# Patient Record
Sex: Female | Born: 1940 | Race: White | Hispanic: No | Marital: Married
Health system: Midwestern US, Community
[De-identification: ages and names within clinical notes are randomized; demographics above are authoritative.]

## PROBLEM LIST (undated history)

## (undated) DIAGNOSIS — I479 Paroxysmal tachycardia, unspecified: Secondary | ICD-10-CM

## (undated) DIAGNOSIS — C4491 Basal cell carcinoma of skin, unspecified: Secondary | ICD-10-CM

## (undated) DIAGNOSIS — E785 Hyperlipidemia, unspecified: Secondary | ICD-10-CM

## (undated) DIAGNOSIS — F418 Other specified anxiety disorders: Secondary | ICD-10-CM

## (undated) DIAGNOSIS — M549 Dorsalgia, unspecified: Secondary | ICD-10-CM

## (undated) DIAGNOSIS — I10 Essential (primary) hypertension: Secondary | ICD-10-CM

## (undated) DIAGNOSIS — I499 Cardiac arrhythmia, unspecified: Secondary | ICD-10-CM

## (undated) DIAGNOSIS — M25559 Pain in unspecified hip: Secondary | ICD-10-CM

## (undated) DIAGNOSIS — G709 Myoneural disorder, unspecified: Secondary | ICD-10-CM

## (undated) DIAGNOSIS — S32402A Unspecified fracture of left acetabulum, initial encounter for closed fracture: Secondary | ICD-10-CM

## (undated) DIAGNOSIS — M5416 Radiculopathy, lumbar region: Secondary | ICD-10-CM

## (undated) DIAGNOSIS — M48062 Spinal stenosis, lumbar region with neurogenic claudication: Secondary | ICD-10-CM

## (undated) HISTORY — PX: BREAST BIOPSY: SHX20

## (undated) HISTORY — DX: Paroxysmal tachycardia, unspecified: I47.9

## (undated) HISTORY — PX: CARDIAC CATHETERIZATION: SHX172

## (undated) HISTORY — DX: Other specified anxiety disorders: F41.8

## (undated) HISTORY — PX: ABDOMINAL HYSTERECTOMY: SUR658

## (undated) HISTORY — DX: Myoneural disorder, unspecified: G70.9

## (undated) HISTORY — DX: Hyperlipidemia, unspecified: E78.5

## (undated) HISTORY — DX: Dorsalgia, unspecified: M54.9

## (undated) HISTORY — DX: Pain in unspecified hip: M25.559

## (undated) HISTORY — DX: Basal cell carcinoma of skin, unspecified: C44.91

## (undated) HISTORY — DX: Essential (primary) hypertension: I10

## (undated) HISTORY — DX: Cardiac arrhythmia, unspecified: I49.9

## (undated) HISTORY — PX: TOTAL HIP ARTHROPLASTY: SHX124

---

## 2000-05-31 HISTORY — PX: BREAST EXCISIONAL BIOPSY: SUR124

## 2013-03-31 HISTORY — PX: OTHER SURGICAL HISTORY: SHX169

## 2017-02-07 ENCOUNTER — Inpatient Hospital Stay: Admit: 2017-02-07 | Payer: MEDICARE | Primary: Internal Medicine

## 2017-02-07 DIAGNOSIS — Z01818 Encounter for other preprocedural examination: Secondary | ICD-10-CM

## 2017-02-07 LAB — URINALYSIS W/ REFLEX CULTURE
Bilirubin: NEGATIVE
Blood: NEGATIVE
Glucose: NEGATIVE mg/dL
Nitrites: NEGATIVE
Protein: NEGATIVE mg/dL
Specific gravity: 1.023 (ref 1.003–1.030)
Urobilinogen: 1 EU/dL (ref 0.2–1.0)
pH (UA): 6 (ref 5.0–8.0)

## 2017-02-07 LAB — CBC WITH AUTOMATED DIFF
ABS. BASOPHILS: 0 10*3/uL (ref 0.0–0.1)
ABS. EOSINOPHILS: 0.1 10*3/uL (ref 0.0–0.4)
ABS. IMM. GRANS.: 0 10*3/uL (ref 0.00–0.04)
ABS. LYMPHOCYTES: 1.7 10*3/uL (ref 0.8–3.5)
ABS. MONOCYTES: 0.4 10*3/uL (ref 0.0–1.0)
ABS. NEUTROPHILS: 4.6 10*3/uL (ref 1.8–8.0)
ABSOLUTE NRBC: 0 10*3/uL (ref 0.00–0.01)
BASOPHILS: 1 % (ref 0–1)
EOSINOPHILS: 1 % (ref 0–7)
HCT: 38.7 % (ref 35.0–47.0)
HGB: 12.4 g/dL (ref 11.5–16.0)
IMMATURE GRANULOCYTES: 0 % (ref 0.0–0.5)
LYMPHOCYTES: 24 % (ref 12–49)
MCH: 30.2 PG (ref 26.0–34.0)
MCHC: 32 g/dL (ref 30.0–36.5)
MCV: 94.4 FL (ref 80.0–99.0)
MONOCYTES: 7 % (ref 5–13)
MPV: 10.4 FL (ref 8.9–12.9)
NEUTROPHILS: 67 % (ref 32–75)
NRBC: 0 PER 100 WBC
PLATELET: 233 10*3/uL (ref 150–400)
RBC: 4.1 M/uL (ref 3.80–5.20)
RDW: 13.5 % (ref 11.5–14.5)
WBC: 6.8 10*3/uL (ref 3.6–11.0)

## 2017-02-07 LAB — METABOLIC PANEL, COMPREHENSIVE
A-G Ratio: 1.1 (ref 1.1–2.2)
ALT (SGPT): 25 U/L (ref 12–78)
AST (SGOT): 22 U/L (ref 15–37)
Albumin: 3.6 g/dL (ref 3.5–5.0)
Alk. phosphatase: 99 U/L (ref 45–117)
Anion gap: 9 mmol/L (ref 5–15)
BUN/Creatinine ratio: 18 (ref 12–20)
BUN: 16 MG/DL (ref 6–20)
Bilirubin, total: 0.5 MG/DL (ref 0.2–1.0)
CO2: 32 mmol/L (ref 21–32)
Calcium: 9.5 MG/DL (ref 8.5–10.1)
Chloride: 102 mmol/L (ref 97–108)
Creatinine: 0.88 MG/DL (ref 0.55–1.02)
GFR est AA: >60 mL/min/{1.73_m2} (ref >60)
GFR est non-AA: >60 mL/min/{1.73_m2} (ref >60)
Globulin: 3.3 g/dL (ref 2.0–4.0)
Glucose: 85 mg/dL (ref 65–100)
Potassium: 4.4 mmol/L (ref 3.5–5.1)
Protein, total: 6.9 g/dL (ref 6.4–8.2)
Sodium: 143 mmol/L (ref 136–145)

## 2017-02-07 LAB — HEMOGLOBIN A1C WITH EAG
Est. average glucose: 105 mg/dL
Hemoglobin A1c: 5.3 % (ref 4.2–6.3)

## 2017-02-07 LAB — PROTHROMBIN TIME + INR
INR: 1 (ref 0.9–1.1)
Prothrombin time: 10.5 s (ref 9.0–11.1)

## 2017-02-07 LAB — TYPE & SCREEN
ABO/Rh(D): A POS
Antibody screen: NEGATIVE

## 2017-02-07 LAB — PTT: aPTT: 23.2 s (ref 22.1–32.0)

## 2017-02-07 LAB — TYPE AND SCREEN
ABO/Rh: A POS
Antibody Screen: NEGATIVE

## 2017-02-07 NOTE — Other (Signed)
Requested today's OV note from Dr. Michel HarrowEvan's office, cardiologist.  Have not received the note as of yet.  DOS: 02/15/2017

## 2017-02-07 NOTE — H&P (Addendum)
PAT Pre-Op History & Physical    Patient: Natasha White                  MRN: [id number]          SSN: [id number]  Date of Birth: Nov 25, 1940          Age: 76 y.o.             Sex: female              Subjective:   Patient is a 76 y.o. Caucasian female who presents with history of chronic lower back pain that began in January of this year per patient report. Denies any injury or trauma that preceded the onset of her lower back pain. Rates her pain 7/10- 10/10. States that her pain radiates from her lower back into her left inner thigh and groin. Describes her pain as intermittent aching and burning pain. Walking exacerbates her pain and it is worse at night. She has to use a walker at home and her activity level has decreased due to her lower back pain.  Ha failed ESI, NSAIDs, narcotic pain medication (oral and patch) and PT.  The patient was evaluated in the surgeon's office and it was determined that the most appropriate plan of care is to proceed with surgical intervention.  Patient's PCP Esperanza Sheets, MD            Past Medical History:   Diagnosis Date   ??? Arrhythmia     PVCs   ??? Arthritis    ??? Bilateral lower extremity edema     Takes Bumex PO daily PRN.   ??? 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 Chronic pain     back/ left hip   ??? GERD (gastroesophageal reflux disease)    ??? High cholesterol    ??? Hypertension    ??? Nausea & vomiting     scopalamine patch helps   ??? Obesity (BMI 30-39.9) 02/07/2017    BMI= 35.6   ??? Psychiatric disorder     depression/ anxiety   ??? Vertigo       Past Surgical History:   Procedure Laterality Date   ??? HX BREAST BIOPSY      benign   ??? HX HEART CATHETERIZATION  1998    no blockages   ??? HX HYSTERECTOMY  1987    also removed ovaries   ??? HX KNEE REPLACEMENT Left 2014    had surgery after a fall at rehab- ? revision   ??? TOTAL KNEE ARTHROPLASTY Right 2009, 2012      Prior to Admission medications    Medication Sig Start Date End Date Taking? Authorizing Provider    metoprolol succinate (TOPROL-XL) 50 mg XL tablet Take 50 mg by mouth Daily (before breakfast).   Yes Historical Provider   pravastatin (PRAVACHOL) 40 mg tablet Take 40 mg by mouth nightly.   Yes Historical Provider   buPROPion SR [facility] SR) 150 mg SR tablet Take 150 mg by mouth two (2) times a day.   Yes Historical Provider   citalopram (CELEXA) 40 mg tablet Take 40 mg by mouth Daily (before breakfast).   Yes Historical Provider   aspirin delayed-release 81 mg tablet Take 81 mg by mouth Daily (before breakfast).   Yes Historical Provider   fentaNYL (DURAGESIC) 50 mcg/hr PATCH 1 Patch by TransDERmal route every seventy-two (72) hours.   Yes Historical Provider   cholecalciferol (VITAMIN D3) 1,000 unit  tablet Take 1,000 Units by mouth daily.   Yes Historical Provider   calcium-cholecalciferol, d3, (CALCIUM 600 + D) 600-125 mg-unit tab Take 1 Tab by mouth daily.   Yes Historical Provider   multivitamin with minerals (HAIR,SKIN AND NAILS PO) Take 2 Tabs by mouth daily.   Yes Historical Provider   gabapentin (NEURONTIN) 300 mg capsule Take 300 mg by mouth Daily (before breakfast).   Yes Historical Provider   gabapentin (NEURONTIN) 600 mg tablet Take 600 mg by mouth nightly.   Yes Historical Provider   oxyCODONE-acetaminophen (PERCOCET) 7.5-325 mg per tablet Take 1 Tab by mouth every eight (8) hours as needed for Pain. Indications: Pain   Yes Historical Provider   LORazepam (ATIVAN) 1 mg tablet Take 1 mg by mouth daily as needed for Anxiety.   Yes Historical Provider   ibuprofen (ADVIL) 200 mg tablet Take 200 mg by mouth every six (6) hours as needed for Pain. Takes 2-3 tablets   Yes Historical Provider   bumetanide (BUMEX PO) Take 1 mg by mouth daily as needed.   Yes Historical Provider     Current Outpatient Prescriptions   Medication Sig   ??? metoprolol succinate (TOPROL-XL) 50 mg XL tablet Take 50 mg by mouth Daily (before breakfast).   ??? pravastatin (PRAVACHOL) 40 mg tablet Take 40 mg by mouth nightly.    ??? buPROPion SR (WELLBUTRIN SR) 150 mg SR tablet Take 150 mg by mouth two (2) times a day.   ??? citalopram (CELEXA) 40 mg tablet Take 40 mg by mouth Daily (before breakfast).   ??? aspirin delayed-release 81 mg tablet Take 81 mg by mouth Daily (before breakfast).   ??? fentaNYL (DURAGESIC) 50 mcg/hr PATCH 1 Patch by TransDERmal route every seventy-two (72) hours.   ??? cholecalciferol (VITAMIN D3) 1,000 unit tablet Take 1,000 Units by mouth daily.   ??? calcium-cholecalciferol, d3, (CALCIUM 600 + D) 600-125 mg-unit tab Take 1 Tab by mouth daily.   ??? multivitamin with minerals (HAIR,SKIN AND NAILS PO) Take 2 Tabs by mouth daily.   ??? gabapentin (NEURONTIN) 300 mg capsule Take 300 mg by mouth Daily (before breakfast).   ??? gabapentin (NEURONTIN) 600 mg tablet Take 600 mg by mouth nightly.   ??? oxyCODONE-acetaminophen (PERCOCET) 7.5-325 mg per tablet Take 1 Tab by mouth every eight (8) hours as needed for Pain. Indications: Pain   ??? LORazepam (ATIVAN) 1 mg tablet Take 1 mg by mouth daily as needed for Anxiety.   ??? ibuprofen (ADVIL) 200 mg tablet Take 200 mg by mouth every six (6) hours as needed for Pain. Takes 2-3 tablets   ??? bumetanide (BUMEX PO) Take 1 mg by mouth daily as needed.     No current facility-administered medications for this encounter.       Allergies   Allergen Reactions   ??? Codeine Nausea Only      Social History   Substance Use Topics   ??? Smoking status: Former Smoker     Packs/day: 0.20     Years: 3.00     Quit date: 02/08/1964   ??? Smokeless tobacco: Never Used   ??? Alcohol use 4.2 oz/week     7 Glasses of wine per week      History   Drug Use No     Family History   Problem Relation Age of Onset   ??? Hypertension Mother    ??? Cancer Mother      pancreatic   ??? Post-op Nausea/Vomiting Mother    ???  Lung Cancer Father    ??? MS Sister    ??? Hypertension Sister          Review of Systems    Patient denies difficulty swallowing, mouth sores, or loose teeth. Patient  denies any recent dental procedures or any planned prior to surgery.  Patient denies chest pain, tightness, pain radiating down left arm, palpitations.  Denies dizziness, visual disturbances, or lightheadedness. Patient denies shortness of breath at rest, wheezing, cough, fever, or chills. Patient denies diarrhea, constipation, or abdominal pain. Patient denies urinary problems including dysuria or hesitancy. C/o urge incontinence.  Denies skin breakdown, rashes, insect bites or open area. C/o lower back and LLE pain.        Objective:   Patient Vitals for the past 24 hrs:   Temp Pulse Resp BP SpO2   02/07/17 1404 97.9 ??F (36.6 ??C) 61 17 120/57 93 %     Temp (24hrs), Avg:97.9 ??F (36.6 ??C), Min:97.9 ??F (36.6 ??C), Max:97.9 ??F (36.6 ??C)    Body mass index is 35.68 kg/(m^2).  Wt Readings from Last 1 Encounters:   02/07/17 94.3 kg (207 lb 14.3 oz)        Physical Exam:     General: Pleasant,  cooperative, no apparent distress, appears stated age.   Eyes: Conjunctivae/corneas clear. EOMs intact.   Nose: Nares normal.   Mouth/Throat: Lips, mucosa, and tongue normal. Teeth and gums normal.   Neck: Supple, symmetrical, trachea midline.    Back: Symmetric   Lungs: Clear to auscultation bilaterally.   Heart: Regular rate and rhythm, S1, S2 normal. No murmur, click, rub or gallop.   Abdomen: Soft, non-tender. Bowel sounds normal. No distention.   Musculoskeletal:  Patient seated in wheelchair- gait not assessed.   Extremities:  Extremities normal, atraumatic, no cyanosis or edema. Calves                                 supple, non tender to palpation.   Pulses: 2+ and symmetric bilateral upper extremities.  Cap. refill <2 seconds   Skin: Skin color, texture, turgor normal.  No visible rashes or lesions.   Neurologic: CN II-XII grossly intact.  Alert and oriented x3.    Labs:   Recent Results (from the past 68 hour(s))   CULTURE, MRSA    Collection Time: 02/07/17  3:09 PM   Result Value Ref Range     Special Requests: NO SPECIAL REQUESTS      Culture result: MRSA NOT PRESENT      Culture result:            Screening of patient nares for MRSA is for surveillance purposes and, if positive, to facilitate isolation considerations in high risk settings. It is not intended for automatic decolonization interventions per se as regimens are not sufficiently effective to warrant routine use.   CBC WITH AUTOMATED DIFF    Collection Time: 02/07/17  3:22 PM   Result Value Ref Range    WBC 6.8 3.6 - 11.0 K/uL    RBC 4.10 3.80 - 5.20 M/uL    HGB 12.4 11.5 - 16.0 g/dL    HCT 38.7 35.0 - 47.0 %    MCV 94.4 80.0 - 99.0 FL    MCH 30.2 26.0 - 34.0 PG    MCHC 32.0 30.0 - 36.5 g/dL    RDW 13.5 11.5 - 14.5 %    PLATELET 233 150 - 400  K/uL    MPV 10.4 8.9 - 12.9 FL    NRBC 0.0 0 PER 100 WBC    ABSOLUTE NRBC 0.00 0.00 - 0.01 K/uL    NEUTROPHILS 67 32 - 75 %    LYMPHOCYTES 24 12 - 49 %    MONOCYTES 7 5 - 13 %    EOSINOPHILS 1 0 - 7 %    BASOPHILS 1 0 - 1 %    IMMATURE GRANULOCYTES 0 0.0 - 0.5 %    ABS. NEUTROPHILS 4.6 1.8 - 8.0 K/UL    ABS. LYMPHOCYTES 1.7 0.8 - 3.5 K/UL    ABS. MONOCYTES 0.4 0.0 - 1.0 K/UL    ABS. EOSINOPHILS 0.1 0.0 - 0.4 K/UL    ABS. BASOPHILS 0.0 0.0 - 0.1 K/UL    ABS. IMM. GRANS. 0.0 0.00 - 0.04 K/UL    DF AUTOMATED     METABOLIC PANEL, COMPREHENSIVE    Collection Time: 02/07/17  3:22 PM   Result Value Ref Range    Sodium 143 136 - 145 mmol/L    Potassium 4.4 3.5 - 5.1 mmol/L    Chloride 102 97 - 108 mmol/L    CO2 32 21 - 32 mmol/L    Anion gap 9 5 - 15 mmol/L    Glucose 85 65 - 100 mg/dL    BUN 16 6 - 20 MG/DL    Creatinine 0.88 0.55 - 1.02 MG/DL    BUN/Creatinine ratio 18 12 - 20      GFR est AA >60 >60 ml/min/1.72m    GFR est non-AA >60 >60 ml/min/1.755m   Calcium 9.5 8.5 - 10.1 MG/DL    Bilirubin, total 0.5 0.2 - 1.0 MG/DL    ALT (SGPT) 25 12 - 78 U/L    AST (SGOT) 22 15 - 37 U/L    Alk. phosphatase 99 45 - 117 U/L    Protein, total 6.9 6.4 - 8.2 g/dL    Albumin 3.6 3.5 - 5.0 g/dL     Globulin 3.3 2.0 - 4.0 g/dL    A-G Ratio 1.1 1.1 - 2.2     HEMOGLOBIN A1C WITH EAG    Collection Time: 02/07/17  3:22 PM   Result Value Ref Range    Hemoglobin A1c 5.3 4.2 - 6.3 %    Est. average glucose 105 mg/dL   PROTHROMBIN TIME + INR    Collection Time: 02/07/17  3:22 PM   Result Value Ref Range    INR 1.0 0.9 - 1.1      Prothrombin time 10.5 9.0 - 11.1 sec   PTT    Collection Time: 02/07/17  3:22 PM   Result Value Ref Range    aPTT 23.2 22.1 - 32.0 sec    aPTT, therapeutic range     58.0 - 77.0 SECS   URINALYSIS W/ REFLEX CULTURE    Collection Time: 02/07/17  3:22 PM   Result Value Ref Range    Color YELLOW/STRAW      Appearance CLOUDY (A) CLEAR      Specific gravity 1.023 1.003 - 1.030      pH (UA) 6.0 5.0 - 8.0      Protein NEGATIVE  NEG mg/dL    Glucose NEGATIVE  NEG mg/dL    Ketone TRACE (A) NEG mg/dL    Bilirubin NEGATIVE  NEG      Blood NEGATIVE  NEG      Urobilinogen 1.0 0.2 - 1.0 EU/dL    Nitrites NEGATIVE  NEG      Leukocyte Esterase LARGE (A) NEG      WBC 20-50 0 - 4 /hpf    RBC 0-5 0 - 5 /hpf    Epithelial cells MODERATE (A) FEW /lpf    Bacteria 1+ (A) NEG /hpf    UA:UC IF INDICATED URINE CULTURE ORDERED (A) CNI     TYPE & SCREEN    Collection Time: 02/07/17  3:22 PM   Result Value Ref Range    Crossmatch Expiration 02/18/2017     ABO/Rh(D) A POSITIVE     Antibody screen NEG    CULTURE, URINE    Collection Time: 02/07/17  3:22 PM   Result Value Ref Range    Special Requests: NO SPECIAL REQUESTS  Reflexed from [id number]        Colony Count <10,000  COLONIES/mL        Culture result: NO SIGNIFICANT GROWTH     EKG, 12 LEAD, INITIAL    Collection Time: 02/07/17  3:52 PM   Result Value Ref Range    Ventricular Rate 58 BPM    Atrial Rate 58 BPM    P-R Interval 198 ms    QRS Duration 104 ms    Q-T Interval 434 ms    QTC Calculation (Bezet) 426 ms    Calculated P Axis 55 degrees    Calculated R Axis 88 degrees    Calculated T Axis 3 degrees    Diagnosis       Sinus bradycardia   Cannot rule out Anterior infarct , age undetermined  Abnormal ECG  No previous ECGs available  Confirmed by Humphrey Rolls MD., Al Pimple [phone]) on 02/07/2017 11:06:30 PM         Assessment:     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    Labs and EKG done per surgeon's orders. Lab results reviewed- unremarkable except UA triggered C&S. Result= 10 K col/ml.   MRSA negative.     Patient has not seen cardiology for annual visit since 12/2015. She cancelled appointment 01/21/2017. Has appointment to see Irven Coe NP at [place] on  02/14/2017 prior to surgery. Copy of last cardiology note dated 01/21/2017 by Dr Amalia Hailey reviewed and placed on paper chart. Faxed copy of EKG done in PAT to [facility]. Will request copy of note after visit completed.     Last PCP note from Dr Jeral Pinch dated 12/24/2016 reviewed and placed on paper chart.        Willey Blade, NP

## 2017-02-07 NOTE — Other (Addendum)
INSTRUCTIONS  [facility]FORE [facility]GERY [facility]  [address]  [place], [place]   [id number]    MAIN OR [phone]    MAIN PRE OP [phone]    AMBULATORY PRE OP [phone]    PRE-ADMISSION TESTING [phone]       Surgery Date:   Tuesday, 02/15/17.        Is surgery arrival time given by surgeon?  NO  If ???NO???, [facility] staff will call you between 3 and 7pm the day before your surgery with your arrival time. (If your surgery is on a Monday, we will call you the Friday before.)    Call [phone] after 7pm Monday-Friday if you did not receive your arrival time. Verification phone call on Monday, 02/14/17.    Answers to Common Questions   When You  Arrive   Arrive at the 2nd Floor Admitting Desk on the day of your surgery  Have your insurance card, photo ID, and any copayment (if needed)     Food   and   Drink   NO food or drink after midnight the night before surgery    This means NO water, gum, mints, coffee, juice, etc.  No alcohol (beer, wine, liquor) 24 hours before and after surgery     Medicine to   TAKE   Morning of Surgery   MEDICATIONS TO TAKE THE MORNING OF SURGERY WITH A SIP OF WATER:   ? Metoprolol, Citalopram, Gabapentin and Wellbutrin with small sip of water.  May TAKE percocet if needed for pain.     Medicine  To  STOP   FOR PAIN  ? You can take Tylenol ??? follow instructions on the bottle  ? NO Aspirin for pain STOP Wednesday, 02/09/17 until after surgery  ? NO Non-Steroidal Anti-Inflammatory Drugs (NSAID???s:   for example, Ibuprofen (Advil, Motrin), Naproxen (Aleve) STOP Wednesday, 02/09/17 until after surgery  ? STOP herbal supplements and vitamins 1 week before surgery STOP Wednesday, 02/09/17 until after surgery     Blood  Thinners   ? If you take Aspirin, Plavix, Coumadin, blood-thinning or anti-clot medicine, talk to your surgeon and/or follow the instructions from the doctor who told you to take that medicine     Clothing  Jewelry  Valuables  Bathing      CLOTHING  ? Wear loose, comfortable clothes  ? Wear glasses instead of contacts  ? Leave money, jewelry and valuables at home  ? No make-up, particularly mascara, the day of surgery  ? REMOVE ALL piercings, rings, and jewelry - leave at home  ? Wear hair loose or down; no pony-tails, buns, or metal hair clips    BATHING  ? Follow all special bath instructions (for total joint replacement, spine and bowel surgeries.)  ? If you shower the morning of surgery, please do not apply any lotions, powders, or deodorants afterwards.  Do not shave or trim anywhere 24 hours before surgery.     Going Home  or  Spending the Night   ? SAME-DAY SURGERY: You must have a responsible adult drive you home and stay with you 24 hours after surgery  ? ADMITS: If your doctor is keeping you into the hospital after surgery, leave personal belongings/luggage in your car until you have a hospital room number.    Hospital discharge time is 12 noon  Drivers must be here before 12 noon unless you are told differently  Follow all instructions so your surgery won???t be cancelled.  Please, be on time.    If a situation occurs and you are delayed the day of surgery, call [phone] or [phone].    If your physical condition changes (like a fever, cold, flu, etc.) call your surgeon as soon as possible.      The Preadmission Testing staff can be reached at [phone].    16.  Special Instructions:  ?? Use Chlorhexidine Care Fusion wash and sponges 3 days prior to surgery as instructed.  ?? Incentive spirometer given with instructions to practice at home and bring back to the hospital on the day of surgery.  ?? [facility] will contact you if your Hemoglobin A1C is greater than 7.5.  ?? Ensure/Glucerna  sample, nutritional information, and Ensure/Glucerna coupon given.  ?? Pain pamphlet and Call Don't Fall reminder reviewed with patient.  ?? Valet parking is complimentary Monday - Friday 7 am - 5 pm   ?? Bring PTA Medication list day of surgery with the last doses taken documented   ?? Do not bring medication bottles the day of surgery  ?? Follow Dr. Levin Bacon instructions for removal of Fentanyl patch on Sunday, 02/13/17.    The patient was contacted  in person.   She  verbalize  understanding of all instructions and does not  need reinforcement.

## 2017-02-08 LAB — EKG, 12 LEAD, INITIAL
Atrial Rate: 58 {beats}/min
Calculated P Axis: 55 degrees
Calculated R Axis: 88 degrees
Calculated T Axis: 3 degrees
P-R Interval: 198 ms
Q-T Interval: 434 ms
QRS Duration: 104 ms
QTC Calculation (Bezet): 426 ms
Ventricular Rate: 58 {beats}/min

## 2017-02-08 LAB — EKG 12-LEAD
Atrial Rate: 58 {beats}/min
P Axis: 55 degrees
P-R Interval: 198 ms
Q-T Interval: 434 ms
QRS Duration: 104 ms
QTc Calculation (Bazett): 426 ms
R Axis: 88 degrees
T Axis: 3 degrees
Ventricular Rate: 58 {beats}/min

## 2017-02-09 LAB — CULTURE, URINE
Colonies Counted: <10000
Colony Count: <10000
Culture result:: NO GROWTH
Culture: NO GROWTH

## 2017-02-09 LAB — CULTURE, MRSA

## 2017-02-14 ENCOUNTER — Encounter: Primary: Internal Medicine

## 2017-02-15 ENCOUNTER — Inpatient Hospital Stay: Admit: 2017-02-15 | Payer: MEDICARE | Primary: Internal Medicine

## 2017-02-15 ENCOUNTER — Inpatient Hospital Stay
Admit: 2017-02-15 | Discharge: 2017-02-18 | Disposition: A | Discharge status: Rehabilitation Facility | Payer: MEDICARE | Attending: Orthopaedic Surgery | Admitting: Orthopaedic Surgery

## 2017-02-15 ENCOUNTER — Inpatient Hospital Stay

## 2017-02-15 DIAGNOSIS — M4316 Spondylolisthesis, lumbar region: Secondary | ICD-10-CM

## 2017-02-15 MED ORDER — SODIUM CHLORIDE 0.9 % IJ SYRG
INTRAMUSCULAR | Status: DC | PRN
Start: 2017-02-15 — End: 2017-02-15

## 2017-02-15 MED ORDER — HYDROMORPHONE (PF) 2 MG/ML IJ SOLN
2 mg/mL | INTRAMUSCULAR | Status: AC
Start: 2017-02-15 — End: ?

## 2017-02-15 MED ORDER — GELATIN ADSORBABLE MUCOSAL POWDER
Status: DC | PRN
Start: 2017-02-15 — End: 2017-02-15
  Administered 2017-02-15 (×2): via TOPICAL

## 2017-02-15 MED ORDER — SUCCINYLCHOLINE CHLORIDE 20 MG/ML INJECTION
20 mg/mL | INTRAMUSCULAR | Status: DC | PRN
Start: 2017-02-15 — End: 2017-02-15
  Administered 2017-02-15: 19:00:00 via INTRAVENOUS

## 2017-02-15 MED ORDER — NALOXONE 0.4 MG/ML INJECTION
0.4 mg/mL | INTRAMUSCULAR | Status: DC | PRN
Start: 2017-02-15 — End: 2017-02-15

## 2017-02-15 MED ORDER — SODIUM CHLORIDE 0.9 % IRRIGATION SOLN
0.9 % | Status: DC | PRN
Start: 2017-02-15 — End: 2017-02-15
  Administered 2017-02-15: 21:00:00

## 2017-02-15 MED ORDER — ONDANSETRON (PF) 4 MG/2 ML INJECTION
4 mg/2 mL | INTRAMUSCULAR | Status: AC
Start: 2017-02-15 — End: ?

## 2017-02-15 MED ORDER — LIDOCAINE (PF) 20 MG/ML (2 %) IV SYRINGE
100 mg/5 mL (2 %) | INTRAVENOUS | Status: AC
Start: 2017-02-15 — End: ?

## 2017-02-15 MED ORDER — FENTANYL CITRATE (PF) 50 MCG/ML IJ SOLN
50 mcg/mL | INTRAMUSCULAR | Status: AC
Start: 2017-02-15 — End: ?

## 2017-02-15 MED ORDER — DIPHENHYDRAMINE HCL 50 MG/ML IJ SOLN
50 mg/mL | INTRAMUSCULAR | Status: DC | PRN
Start: 2017-02-15 — End: 2017-02-15

## 2017-02-15 MED ORDER — SCOPOLAMINE (1.3-1.5) MG 72 HR TRANSDERM PATCH
1 mg over 3 days | TRANSDERMAL | Status: DC
Start: 2017-02-15 — End: 2017-02-18

## 2017-02-15 MED ORDER — GELATIN ADSORBABLE MUCOSAL POWDER
Status: AC
Start: 2017-02-15 — End: ?

## 2017-02-15 MED ORDER — PROPOFOL 10 MG/ML IV EMUL
10 mg/mL | INTRAVENOUS | Status: AC
Start: 2017-02-15 — End: ?

## 2017-02-15 MED ORDER — PROPOFOL 10 MG/ML IV EMUL
10 mg/mL | INTRAVENOUS | Status: DC | PRN
Start: 2017-02-15 — End: 2017-02-15
  Administered 2017-02-15 (×2): via INTRAVENOUS

## 2017-02-15 MED ORDER — MIDAZOLAM 1 MG/ML IJ SOLN
1 mg/mL | INTRAMUSCULAR | Status: AC
Start: 2017-02-15 — End: ?

## 2017-02-15 MED ORDER — LIDOCAINE (PF) 10 MG/ML (1 %) IJ SOLN
10 mg/mL (1 %) | INTRAMUSCULAR | Status: DC | PRN
Start: 2017-02-15 — End: 2017-02-15

## 2017-02-15 MED ORDER — LIDOCAINE (PF) 20 MG/ML (2 %) IJ SOLN
20 mg/mL (2 %) | INTRAMUSCULAR | Status: DC | PRN
Start: 2017-02-15 — End: 2017-02-15
  Administered 2017-02-15: 19:00:00 via INTRAVENOUS

## 2017-02-15 MED ORDER — VANCOMYCIN 1,000 MG IV SOLR
1000 mg | INTRAVENOUS | Status: DC | PRN
Start: 2017-02-15 — End: 2017-02-15
  Administered 2017-02-15: 22:00:00 via TOPICAL

## 2017-02-15 MED ORDER — HYDROMORPHONE 15 MG/30 ML (0.5 MG/ML) IN 0.9% SOD.CHLORIDE PCA IV SOLN
15 mg/30 mL (0.5 mg/mL) | INTRAVENOUS | Status: AC
Start: 2017-02-15 — End: 2017-02-16
  Administered 2017-02-15 – 2017-02-16 (×3): via INTRAVENOUS

## 2017-02-15 MED ORDER — LACTATED RINGERS IV
INTRAVENOUS | Status: DC
Start: 2017-02-15 — End: 2017-02-15
  Administered 2017-02-15 (×3): via INTRAVENOUS

## 2017-02-15 MED ORDER — SURGICAL PAIN SOLUTION (PYXIS)
INTRAMUSCULAR | Status: AC
Start: 2017-02-15 — End: ?

## 2017-02-15 MED ORDER — THROMBIN 20,000 UNIT TOPICAL SOLUTION
20000 unit | CUTANEOUS | Status: AC
Start: 2017-02-15 — End: ?

## 2017-02-15 MED ORDER — ONDANSETRON (PF) 4 MG/2 ML INJECTION
4 mg/2 mL | INTRAMUSCULAR | Status: DC | PRN
Start: 2017-02-15 — End: 2017-02-15
  Administered 2017-02-15: 23:00:00 via INTRAVENOUS

## 2017-02-15 MED ORDER — DEXAMETHASONE SODIUM PHOSPHATE 4 MG/ML IJ SOLN
4 mg/mL | INTRAMUSCULAR | Status: DC | PRN
Start: 2017-02-15 — End: 2017-02-15
  Administered 2017-02-15: 20:00:00 via INTRAVENOUS

## 2017-02-15 MED ORDER — LACTATED RINGERS IV
INTRAVENOUS | Status: DC
Start: 2017-02-15 — End: 2017-02-15
  Administered 2017-02-16: via INTRAVENOUS

## 2017-02-15 MED ORDER — MIDAZOLAM 1 MG/ML IJ SOLN
1 mg/mL | INTRAMUSCULAR | Status: DC | PRN
Start: 2017-02-15 — End: 2017-02-15
  Administered 2017-02-15: 19:00:00 via INTRAVENOUS

## 2017-02-15 MED ORDER — ROCURONIUM 10 MG/ML IV
10 mg/mL | INTRAVENOUS | Status: DC | PRN
Start: 2017-02-15 — End: 2017-02-15
  Administered 2017-02-15 (×2): via INTRAVENOUS

## 2017-02-15 MED ORDER — PROPOFOL 10 MG/ML IV EMUL
10 mg/mL | INTRAVENOUS | Status: DC | PRN
Start: 2017-02-15 — End: 2017-02-15
  Administered 2017-02-15: 20:00:00 via INTRAVENOUS

## 2017-02-15 MED ORDER — HYDROMORPHONE (PF) 2 MG/ML IJ SOLN
2 mg/mL | INTRAMUSCULAR | Status: DC | PRN
Start: 2017-02-15 — End: 2017-02-15
  Administered 2017-02-16: via INTRAVENOUS

## 2017-02-15 MED ORDER — SUCCINYLCHOLINE CHLORIDE 100 MG/5 ML (20 MG/ML) IV SYRINGE
100 mg/5 mL (20 mg/mL) | INTRAVENOUS | Status: AC
Start: 2017-02-15 — End: ?

## 2017-02-15 MED ORDER — SURGICAL PAIN SOLUTION (PYXIS)
INTRAMUSCULAR | Status: DC | PRN
Start: 2017-02-15 — End: 2017-02-15
  Administered 2017-02-15: 22:00:00 via INTRAMUSCULAR

## 2017-02-15 MED ORDER — VANCOMYCIN 1,000 MG IV SOLR
1000 mg | INTRAVENOUS | Status: AC
Start: 2017-02-15 — End: ?

## 2017-02-15 MED ORDER — CEFAZOLIN 2 GRAM/20 ML IN STERILE WATER INTRAVENOUS SYRINGE
2 gram/0 mL | Freq: Once | INTRAVENOUS | Status: AC
Start: 2017-02-15 — End: 2017-02-15
  Administered 2017-02-15: 19:00:00 via INTRAVENOUS

## 2017-02-15 MED ORDER — THROMBIN 20,000 UNIT TOPICAL SOLUTION
20000 unit | CUTANEOUS | Status: DC | PRN
Start: 2017-02-15 — End: 2017-02-15
  Administered 2017-02-15 (×2): via TOPICAL

## 2017-02-15 MED ORDER — SODIUM CHLORIDE 0.9 % IJ SYRG
Freq: Three times a day (TID) | INTRAMUSCULAR | Status: DC
Start: 2017-02-15 — End: 2017-02-15
  Administered 2017-02-15: 18:00:00 via INTRAVENOUS

## 2017-02-15 MED ORDER — HYDROMORPHONE (PF) 2 MG/ML IJ SOLN
2 mg/mL | INTRAMUSCULAR | Status: DC | PRN
Start: 2017-02-15 — End: 2017-02-15
  Administered 2017-02-15 (×3): via INTRAVENOUS

## 2017-02-15 MED ORDER — FLUMAZENIL 0.1 MG/ML IV SOLN
0.1 mg/mL | INTRAVENOUS | Status: DC | PRN
Start: 2017-02-15 — End: 2017-02-15

## 2017-02-15 MED ORDER — DEXAMETHASONE SODIUM PHOSPHATE 4 MG/ML IJ SOLN
4 mg/mL | INTRAMUSCULAR | Status: AC
Start: 2017-02-15 — End: ?

## 2017-02-15 MED ORDER — SODIUM CHLORIDE 0.9 % IV
INTRAVENOUS | Status: AC
Start: 2017-02-15 — End: 2017-02-16
  Administered 2017-02-15 – 2017-02-16 (×2): via INTRAVENOUS

## 2017-02-15 MED ORDER — BACITRACIN 50,000 UNIT IM
50000 unit | INTRAMUSCULAR | Status: AC
Start: 2017-02-15 — End: ?

## 2017-02-15 MED ORDER — FENTANYL CITRATE (PF) 50 MCG/ML IJ SOLN
50 mcg/mL | INTRAMUSCULAR | Status: DC | PRN
Start: 2017-02-15 — End: 2017-02-15
  Administered 2017-02-15 (×4): via INTRAVENOUS

## 2017-02-15 MED FILL — SURGICAL PAIN SOLUTION (PYXIS): INTRAMUSCULAR | Qty: 100

## 2017-02-15 MED FILL — BACITRACIN 50,000 UNIT IM: 50000 unit | INTRAMUSCULAR | Qty: 50000

## 2017-02-15 MED FILL — HYDROMORPHONE 15 MG/30 ML (0.5 MG/ML) IN 0.9% SOD.CHLORIDE INFUSION: 15 mg/30 mL (0.5 mg/mL) | INTRAVENOUS | Qty: 30

## 2017-02-15 MED FILL — DEXAMETHASONE SODIUM PHOSPHATE 4 MG/ML IJ SOLN: 4 mg/mL | INTRAMUSCULAR | Qty: 2

## 2017-02-15 MED FILL — HYDROMORPHONE (PF) 2 MG/ML IJ SOLN: 2 mg/mL | INTRAMUSCULAR | Qty: 1

## 2017-02-15 MED FILL — GELFOAM MUCOSAL POWDER: Qty: 2

## 2017-02-15 MED FILL — SODIUM CHLORIDE 0.9 % IV: INTRAVENOUS | Qty: 250

## 2017-02-15 MED FILL — CEFAZOLIN 2 GRAM/20 ML IN STERILE WATER INTRAVENOUS SYRINGE: 2 gram/0 mL | INTRAVENOUS | Qty: 20

## 2017-02-15 MED FILL — LIDOCAINE (PF) 20 MG/ML (2 %) IV SYRINGE: 100 mg/5 mL (2 %) | INTRAVENOUS | Qty: 5

## 2017-02-15 MED FILL — MIDAZOLAM 1 MG/ML IJ SOLN: 1 mg/mL | INTRAMUSCULAR | Qty: 2

## 2017-02-15 MED FILL — THROMBIN-JMI 20,000 UNIT TOPICAL SOLUTION: 20000 unit | CUTANEOUS | Qty: 20000

## 2017-02-15 MED FILL — DIPRIVAN 10 MG/ML INTRAVENOUS EMULSION: 10 mg/mL | INTRAVENOUS | Qty: 50

## 2017-02-15 MED FILL — GELFOAM MUCOSAL POWDER: Qty: 4

## 2017-02-15 MED FILL — LACTATED RINGERS IV: INTRAVENOUS | Qty: 1000

## 2017-02-15 MED FILL — SUCCINYLCHOLINE CHLORIDE 100 MG/5 ML (20 MG/ML) IV SYRINGE: 100 mg/5 mL (20 mg/mL) | INTRAVENOUS | Qty: 5

## 2017-02-15 MED FILL — DIPRIVAN 10 MG/ML INTRAVENOUS EMULSION: 10 mg/mL | INTRAVENOUS | Qty: 20

## 2017-02-15 MED FILL — FENTANYL CITRATE (PF) 50 MCG/ML IJ SOLN: 50 mcg/mL | INTRAMUSCULAR | Qty: 5

## 2017-02-15 MED FILL — VANCOMYCIN 1,000 MG IV SOLR: 1000 mg | INTRAVENOUS | Qty: 1000

## 2017-02-15 MED FILL — ONDANSETRON (PF) 4 MG/2 ML INJECTION: 4 mg/2 mL | INTRAMUSCULAR | Qty: 2

## 2017-02-15 MED FILL — TRANSDERM-SCOP 1 MG OVER 3 DAYS TRANSDERMAL PATCH: 1 mg over 3 days | TRANSDERMAL | Qty: 1

## 2017-02-15 MED FILL — THROMBIN-JMI 20,000 UNIT TOPICAL SOLUTION: 20000 unit | CUTANEOUS | Qty: 40000

## 2017-02-15 MED FILL — BD POSIFLUSH NORMAL SALINE 0.9 % INJECTION SYRINGE: INTRAMUSCULAR | Qty: 10

## 2017-02-15 NOTE — Op Note (Signed)
[facility]  [address]  [place], [place] [address]    OPERATIVE REPORT      NAME: Savanna Dooley    AGE: 76 y.o.    DATE OF BIRTH: [birth year]    MEDICAL RECORD NUMBER: [id number]    DATE OF SURGERY: 02/15/2017    PREOPERATIVE DIAGNOSES:  1. Lumbar stenosis.   2. Acquired lumbar spondylolisthesis.     POSTOPERATIVE DIAGNOSES:  1. Lumbar stenosis.   2. Acquired lumbar spondylolisthesis.     OPERATIVE PROCEDURES:  1. Laminectomy, partial facetectomy, and foraminotomy of L5.   2. Laminectomy, partial facetectomy, and foraminotomy of L4.  3. Laminectomy, partial facetectomy, and foraminotomy of L3.  3. Posterolateral fusion and posterior lumbar interbody fusion of L4-5.   4. Pedicle screw instrumentation with Pioneer pedicle screws for L4 and L5 bilaterally.   5. Application of biomechanical intervertebral body device at L4-5   6. Morselized allograft for spine surgery and local autograft for spine surgery with stem cells.     SURGEON: Elba Barman, MD     ASSISTANT: Mendel Ryder, PA    Specimens: none    ANESTHESIA: General    ESTIMATED BLOOD LOSS:  350    COMPLICATIONS: None apparent    NEUROMONITORING: We used SSEPs and spontaneous EMGs with pedicle screw stimulation    INDICATION: The patient is a very pleasant 76 y.o. female with debilitating leg pain and back pain. She failed conservative measures. She elected to proceed with operative intervention. She was aware of the risks, benefits, and alternatives. She provided informed consent.     DESCRIPTION OF PROCEDURE: The patient was identified in the preoperative holding area. The lumbar spine was marked by me. She was transferred to the operating room where general anesthesia was given. She was also given perioperative antibiotics. She was placed prone on the [place] table. All bony prominences were well padded. The lumbar spine was prepped and draped in the usual standard fashion. We performed a surgical time out.      I made a skin incision from L3 to L5. I exposed the posterior lumbar spine in standard fashion. I took intraoperative fluoroscopy to verify our levels. I exposed the transverse processes of L4 and L5 bilaterally.     I then began my decompression by performing a laminectomy of L4. I also performed a partial facetectomy and foraminotomy to decompress the thecal sac and nerve roots of L4.     I performed a laminectomy of L5. I performed a partial facetectomy bilaterally and foraminotomy to decompress the thecal sac and traversing L5 nerve roots. I decompressed the stenosis found within the foramen and lateral to the foramen for L4-L5. At this point our transforaminal approach to L5 on the right side was complete with exposure of the right L4-5 disc space. I also performed a laminectomy of L3 with bilateral partial facetectomy.    I then performed a standard discectomy at L4-5. I prepared the endplates to bleeding bone. I performed trial sizing. I placed a biomechanical device into L4-5 with the appropriate amount of tension and alignment. I placed bone graft into the biomechanical device.     I then cannulated our pedicles for L4 and L5 bilaterally in standard fashion. I probed each pedicle. I copiously irrigated the entire wound. I decorticated our fusion beds bilaterally with a high-speed burr. I placed our bone graft into our fusions beds bilaterally. I then placed pedicle screws for L4 and L5 bilaterally in standard fashion  under fluoroscopic guidance. I had good purchase for each pedicle. I then stimulated all 4 pedicle screws with pedicle screw stimulation. The pedicle screws were found to be within the appropriate range of amplitude. I then placed contoured rods on top of the pedicles bilaterally. The rods were locked to the pedicle screws according to the manufacturer's specification with set screws.     We had good hemostasis. There was no CSF leaking. The fusion beds were  packed with morselized allograft and local autograft. The exposed neurologic elements were protected with Duragen. I injected the soft tissues with a pain management cocktail. A deep drain was placed. The wound was closed in layers. A sterile dressing was applied. The patient was extubated and transferred to the recovery room in good medical condition.     I, Dr. Norlene Duel, performed the above procedures.     Norlene Duel, MD  02/15/2017

## 2017-02-15 NOTE — Other (Signed)
Pt fall protocol  Yellow arm band on patient, yellow non skid socks on   bed in low position, all side rails up, call bell in reach  Pt  & family instructed in "call don't fall" protocol     Use your call bell,and wait for assistance, staff not family will assist you to get up &   move about  Pt & family verbalize understanding of fall precautions and "call don't fall" protocol

## 2017-02-15 NOTE — Anesthesia Pre-Procedure Evaluation (Addendum)
Anesthetic History     PONV          Review of Systems / Medical History  Patient summary reviewed, nursing notes reviewed and pertinent labs reviewed    Pulmonary    COPD: mild      Undiagnosed apnea    Pertinent negatives: No recent URI     Neuro/Psych         Psychiatric history (depression/anxiety)     Cardiovascular    Hypertension        Dysrhythmias : PVC  Hyperlipidemia         GI/Hepatic/Renal     GERD: well controlled           Endo/Other    Diabetes (diet controlled): type 2    Morbid obesity and arthritis     Other Findings   Comments: Vertigo  Chronic pain  Second hand smoke  Social etoh         Physical Exam    Airway  Mallampati: II  TM Distance: 4 - 6 cm  Neck ROM: normal range of motion   Mouth opening: Diminished (comment)     Cardiovascular    Rhythm: regular  Rate: normal         Dental  No notable dental hx       Pulmonary  Breath sounds clear to auscultation               Abdominal  GI exam deferred       Other Findings            Anesthetic Plan    ASA: 3  Anesthesia type: general          Induction: Intravenous  Anesthetic plan and risks discussed with: Patient

## 2017-02-15 NOTE — H&P (Signed)
Date of Surgery Update:  Natasha White was seen and examined.  History and physical has been reviewed. The patient has been examined. There have been no significant clinical changes since the completion of the originally dated History and Physical.    Signed By: Norlene Duel, MD     February 15, 2017 1:39 PM

## 2017-02-15 NOTE — Other (Signed)
TRANSFER - OUT REPORT:    Verbal report given to melanie rn(name) on Natasha White  being transferred to 407(unit) for routine post - op       Report consisted of patient???s Situation, Background, Assessment and   Recommendations(SBAR).     Information from the following report(s) SBAR, Procedure Summary, Intake/Output and MAR was reviewed with the receiving nurse.    Lines:   Peripheral IV 02/15/17 Right Wrist (Active)   Site Assessment Clean, dry, & intact 02/15/2017  7:36 PM   Phlebitis Assessment 0 02/15/2017  7:36 PM   Infiltration Assessment 0 02/15/2017  7:36 PM   Dressing Status Clean, dry, & intact 02/15/2017  7:36 PM   Dressing Type Tape;Transparent 02/15/2017  7:36 PM   Hub Color/Line Status Pink 02/15/2017  7:36 PM   Action Taken Open ports on tubing capped 02/15/2017  2:23 PM   Alcohol Cap Used Yes 02/15/2017  2:23 PM       Peripheral IV 02/15/17 Left Wrist (Active)   Site Assessment Clean, dry, & intact 02/15/2017  7:36 PM   Phlebitis Assessment 0 02/15/2017  7:36 PM   Infiltration Assessment 0 02/15/2017  7:36 PM   Dressing Status Clean, dry, & intact 02/15/2017  7:36 PM   Dressing Type Tape;Transparent 02/15/2017  7:36 PM   Hub Color/Line Status Pink 02/15/2017  7:36 PM        Opportunity for questions and clarification was provided.      Patient transported with:   O2 @ 2 liters  Registered Nurse

## 2017-02-15 NOTE — Anesthesia Post-Procedure Evaluation (Signed)
Post-Anesthesia Evaluation and Assessment    Patient: Natasha White MRN: [id number]  SSN: [id number]    Date of Birth: 05/04/1941  Age: 76 y.o.  Sex: female       Cardiovascular Function/Vital Signs  Visit Vitals   ??? BP 154/74   ??? Pulse 95   ??? Temp 37.1 ??C (98.8 ??F)   ??? Resp 18   ??? SpO2 96%       Patient is status post general anesthesia for Procedure(s):  L3-L5 LAMINECTOMY, L4-L5 FUSION, INSTRUMENTATION, TLIF, BONE GRAFT.    Nausea/Vomiting: None    Postoperative hydration reviewed and adequate.    Pain:  Pain Scale 1: Numeric (0 - 10) (02/15/17 1413)  Pain Intensity 1: 2 (02/15/17 1413)   Managed    Neurological Status:   Neuro (WDL): Within Defined Limits (02/15/17 1435)   At baseline    Mental Status and Level of Consciousness: Arousable    Pulmonary Status:   O2 Device: Nasal cannula (02/15/17 1922)   Adequate oxygenation and airway patent    Complications related to anesthesia: None    Post-anesthesia assessment completed. No concerns    Signed By: Satira MccallumJason Carra Brindley, MD     February 15, 2017

## 2017-02-15 NOTE — Op Note (Signed)
[facility]  [address]  [place], [place] [address]    OPERATIVE REPORT      NAME: Natasha White    AGE: 76 y.o.    DATE OF BIRTH: [birth year]    MEDICAL RECORD NUMBER: [id number]    DATE OF SURGERY: 02/15/2017    PREOPERATIVE DIAGNOSES:  1. Lumbar stenosis.   2. Acquired lumbar spondylolisthesis.     POSTOPERATIVE DIAGNOSES:  1. Lumbar stenosis.   2. Acquired lumbar spondylolisthesis.     OPERATIVE PROCEDURES:  1. Laminectomy, partial facetectomy, and foraminotomy of L5.   2. Laminectomy, partial facetectomy, and foraminotomy of L4.  3. Laminectomy, partial facetectomy, and foraminotomy of L3.  3. Posterolateral fusion and posterior lumbar interbody fusion of L4-5.   4. Pedicle screw instrumentation with Pioneer pedicle screws for L4 and L5 bilaterally.   5. Application of biomechanical intervertebral body device at L4-5   6. Morselized allograft for spine surgery and local autograft for spine surgery with stem cells.     SURGEON: Elba Barman, MD     ASSISTANT: Mendel Ryder, PA    Specimens: none    ANESTHESIA: General    ESTIMATED BLOOD LOSS:  350    COMPLICATIONS: None apparent    NEUROMONITORING: We used SSEPs and spontaneous EMGs with pedicle screw stimulation    INDICATION: The patient is a very pleasant 76 y.o. female with debilitating leg pain and back pain. She failed conservative measures. She elected to proceed with operative intervention. She was aware of the risks, benefits, and alternatives. She provided informed consent.     DESCRIPTION OF PROCEDURE: The patient was identified in the preoperative holding area. The lumbar spine was marked by me. She was transferred to the operating room where general anesthesia was given. She was also given perioperative antibiotics. She was placed prone on the [place] table. All bony prominences were well padded. The lumbar spine was prepped and draped in the usual standard fashion. We performed a surgical time out.     I made a skin  incision from L3 to L5. I exposed the posterior lumbar spine in standard fashion. I took intraoperative fluoroscopy to verify our levels. I exposed the transverse processes of L4 and L5 bilaterally.     I then began my decompression by performing a laminectomy of L4. I also performed a partial facetectomy and foraminotomy to decompress the thecal sac and nerve roots of L4.     I performed a laminectomy of L5. I performed a partial facetectomy bilaterally and foraminotomy to decompress the thecal sac and traversing L5 nerve roots. I decompressed the stenosis found within the foramen and lateral to the foramen for L4-L5. At this point our transforaminal approach to L5 on the right side was complete with exposure of the right L4-5 disc space. I also performed a laminectomy of L3 with bilateral partial facetectomy.    I then performed a standard discectomy at L4-5. I prepared the endplates to bleeding bone. I performed trial sizing. I placed a biomechanical device into L4-5 with the appropriate amount of tension and alignment. I placed bone graft into the biomechanical device.     I then cannulated our pedicles for L4 and L5 bilaterally in standard fashion. I probed each pedicle. I copiously irrigated the entire wound. I decorticated our fusion beds bilaterally with a high-speed burr. I placed our bone graft into our fusions beds bilaterally. I then placed pedicle screws for L4 and L5 bilaterally in standard fashion  under fluoroscopic guidance. I had good purchase for each pedicle. I then stimulated all 4 pedicle screws with pedicle screw stimulation. The pedicle screws were found to be within the appropriate range of amplitude. I then placed contoured rods on top of the pedicles bilaterally. The rods were locked to the pedicle screws according to the manufacturer's specification with set screws.     We had good hemostasis. There was no CSF leaking. The fusion beds were packed with morselized allograft and local  autograft. The exposed neurologic elements were protected with Duragen. I injected the soft tissues with a pain management cocktail. A deep drain was placed. The wound was closed in layers. A sterile dressing was applied. The patient was extubated and transferred to the recovery room in good medical condition.     I, Dr. Norlene Duel, performed the above procedures.     Norlene Duel, MD  02/15/2017

## 2017-02-16 LAB — METABOLIC PANEL, BASIC
Anion gap: 7 mmol/L (ref 5–15)
Anion gap: 5 mmol/L (ref 5–15)
BUN/Creatinine ratio: 17 (ref 12–20)
BUN/Creatinine ratio: 18 (ref 12–20)
BUN: 17 MG/DL (ref 6–20)
BUN: 18 MG/DL (ref 6–20)
CO2: 31 mmol/L (ref 21–32)
CO2: 28 mmol/L (ref 21–32)
Calcium: 8.7 MG/DL (ref 8.5–10.1)
Calcium: 8.2 MG/DL — ABNORMAL LOW (ref 8.5–10.1)
Chloride: 105 mmol/L (ref 97–108)
Chloride: 105 mmol/L (ref 97–108)
Creatinine: 0.98 MG/DL (ref 0.55–1.02)
Creatinine: 0.99 MG/DL (ref 0.55–1.02)
GFR est AA: >60 mL/min/{1.73_m2} (ref >60)
GFR est AA: >60 mL/min/{1.73_m2} (ref >60)
GFR est non-AA: 55 mL/min/{1.73_m2} — ABNORMAL LOW (ref >60)
GFR est non-AA: 55 mL/min/{1.73_m2} — ABNORMAL LOW (ref >60)
Glucose: 139 mg/dL — ABNORMAL HIGH (ref 65–100)
Glucose: 136 mg/dL — ABNORMAL HIGH (ref 65–100)
Potassium: 4.3 mmol/L (ref 3.5–5.1)
Potassium: 4.5 mmol/L (ref 3.5–5.1)
Sodium: 143 mmol/L (ref 136–145)
Sodium: 138 mmol/L (ref 136–145)

## 2017-02-16 LAB — HEMOGLOBIN: HGB: 10.8 g/dL — ABNORMAL LOW (ref 11.5–16.0)

## 2017-02-16 LAB — SAMPLES BEING HELD

## 2017-02-16 MED ORDER — SODIUM CHLORIDE 0.9% BOLUS IV
0.9 % | INTRAVENOUS | Status: AC
Start: 2017-02-16 — End: 2017-02-16
  Administered 2017-02-16: 22:00:00 via INTRAVENOUS

## 2017-02-16 MED ORDER — FLU VACCINE QV 2018-19 (6 MOS+)(PF) 60 MCG (15 MCG X 4)/0.5 ML IM SYRINGE
60 mcg (15 mcg x 4)/0.5 mL | INTRAMUSCULAR | Status: DC
Start: 2017-02-16 — End: 2017-02-18

## 2017-02-16 MED ORDER — DIPHENHYDRAMINE HCL 50 MG/ML IJ SOLN
50 mg/mL | Freq: Four times a day (QID) | INTRAMUSCULAR | Status: DC | PRN
Start: 2017-02-16 — End: 2017-02-18

## 2017-02-16 MED ORDER — .PHARMACY TO SUBSTITUTE PER PROTOCOL
Status: DC | PRN
Start: 2017-02-16 — End: 2017-02-15

## 2017-02-16 MED ORDER — GABAPENTIN 300 MG CAP
300 mg | Freq: Every evening | ORAL | Status: DC
Start: 2017-02-16 — End: 2017-02-18
  Administered 2017-02-16 – 2017-02-18 (×3): via ORAL

## 2017-02-16 MED ORDER — BUPROPION SR 150 MG TAB
150 mg | Freq: Two times a day (BID) | ORAL | Status: DC
Start: 2017-02-16 — End: 2017-02-18
  Administered 2017-02-16 – 2017-02-18 (×6): via ORAL

## 2017-02-16 MED ORDER — CHOLECALCIFEROL (VITAMIN D3) 1,000 UNIT (25 MCG) TAB
Freq: Every day | ORAL | Status: DC
Start: 2017-02-16 — End: 2017-02-17
  Administered 2017-02-16: 13:00:00 via ORAL

## 2017-02-16 MED ORDER — POLYETHYLENE GLYCOL 3350 17 GRAM (100 %) ORAL POWDER PACKET
17 gram | Freq: Every day | ORAL | Status: DC
Start: 2017-02-16 — End: 2017-02-18
  Administered 2017-02-16 – 2017-02-18 (×2): via ORAL

## 2017-02-16 MED ORDER — BISACODYL 10 MG RECTAL SUPPOSITORY
10 mg | Freq: Every day | RECTAL | Status: DC | PRN
Start: 2017-02-16 — End: 2017-02-18

## 2017-02-16 MED ORDER — CITALOPRAM 20 MG TAB
20 mg | Freq: Every day | ORAL | Status: DC
Start: 2017-02-16 — End: 2017-02-18
  Administered 2017-02-16 – 2017-02-18 (×3): via ORAL

## 2017-02-16 MED ORDER — GABAPENTIN 300 MG CAP
300 mg | Freq: Every day | ORAL | Status: DC
Start: 2017-02-16 — End: 2017-02-18
  Administered 2017-02-16 – 2017-02-18 (×3): via ORAL

## 2017-02-16 MED ORDER — OXYCODONE 5 MG TAB
5 mg | ORAL | Status: DC | PRN
Start: 2017-02-16 — End: 2017-02-16
  Administered 2017-02-16: 16:00:00 via ORAL

## 2017-02-16 MED ORDER — FAMOTIDINE 20 MG TAB
20 mg | Freq: Two times a day (BID) | ORAL | Status: DC
Start: 2017-02-16 — End: 2017-02-18
  Administered 2017-02-16 – 2017-02-18 (×6): via ORAL

## 2017-02-16 MED ORDER — OXYCODONE 5 MG TAB
5 mg | ORAL | Status: DC | PRN
Start: 2017-02-16 — End: 2017-02-16

## 2017-02-16 MED ORDER — ONDANSETRON (PF) 4 MG/2 ML INJECTION
4 mg/2 mL | Freq: Four times a day (QID) | INTRAMUSCULAR | Status: DC | PRN
Start: 2017-02-16 — End: 2017-02-18

## 2017-02-16 MED ORDER — SODIUM CHLORIDE 0.9 % IJ SYRG
INTRAMUSCULAR | Status: DC | PRN
Start: 2017-02-16 — End: 2017-02-18

## 2017-02-16 MED ORDER — CALCIUM-CHOLECALCIFEROL (D3) 500 MG (1,250 MG)-200 UNIT TAB
500 mg-200 unit | Freq: Every day | ORAL | Status: DC
Start: 2017-02-16 — End: 2017-02-17
  Administered 2017-02-16: 13:00:00 via ORAL

## 2017-02-16 MED ORDER — HYDROMORPHONE (PF) 2 MG/ML IJ SOLN
2 mg/mL | INTRAMUSCULAR | Status: DC | PRN
Start: 2017-02-16 — End: 2017-02-16

## 2017-02-16 MED ORDER — MULTIVITAMIN WITH IRON TABLET
Freq: Every day | ORAL | Status: DC
Start: 2017-02-16 — End: 2017-02-17
  Administered 2017-02-16: 13:00:00 via ORAL

## 2017-02-16 MED ORDER — ACETAMINOPHEN 325 MG TABLET
325 mg | Freq: Four times a day (QID) | ORAL | Status: DC | PRN
Start: 2017-02-16 — End: 2017-02-18

## 2017-02-16 MED ORDER — CYCLOBENZAPRINE 10 MG TAB
10 mg | Freq: Three times a day (TID) | ORAL | Status: DC | PRN
Start: 2017-02-16 — End: 2017-02-16
  Administered 2017-02-16: 03:00:00 via ORAL

## 2017-02-16 MED ORDER — METOPROLOL SUCCINATE SR 50 MG 24 HR TAB
50 mg | Freq: Every day | ORAL | Status: DC
Start: 2017-02-16 — End: 2017-02-17
  Administered 2017-02-16: 11:00:00 via ORAL

## 2017-02-16 MED ORDER — SODIUM CHLORIDE 0.9 % IJ SYRG
Freq: Three times a day (TID) | INTRAMUSCULAR | Status: DC
Start: 2017-02-16 — End: 2017-02-18
  Administered 2017-02-16 – 2017-02-18 (×7): via INTRAVENOUS

## 2017-02-16 MED ORDER — BUMETANIDE 1 MG TAB
1 mg | Freq: Every day | ORAL | Status: DC | PRN
Start: 2017-02-16 — End: 2017-02-18

## 2017-02-16 MED ORDER — PRAVASTATIN 20 MG TAB
20 mg | Freq: Every evening | ORAL | Status: DC
Start: 2017-02-16 — End: 2017-02-18
  Administered 2017-02-16 – 2017-02-18 (×3): via ORAL

## 2017-02-16 MED ORDER — CEFAZOLIN 2 GRAM/20 ML IN STERILE WATER INTRAVENOUS SYRINGE
2 gram/0 mL | Freq: Three times a day (TID) | INTRAVENOUS | Status: AC
Start: 2017-02-16 — End: 2017-02-16
  Administered 2017-02-16 (×3): via INTRAVENOUS

## 2017-02-16 MED ORDER — BENZOCAINE-MENTHOL 15 MG-3.6 MG LOZENGES
Status: DC | PRN
Start: 2017-02-16 — End: 2017-02-18

## 2017-02-16 MED ORDER — LORAZEPAM 1 MG TAB
1 mg | Freq: Every day | ORAL | Status: DC | PRN
Start: 2017-02-16 — End: 2017-02-18

## 2017-02-16 MED ORDER — NALOXONE 0.4 MG/ML INJECTION
0.4 mg/mL | INTRAMUSCULAR | Status: DC | PRN
Start: 2017-02-16 — End: 2017-02-18

## 2017-02-16 MED ORDER — FENTANYL 50 MCG/HR 72 HR TRANSDERM PATCH
50 mcg/hr | TRANSDERMAL | Status: DC
Start: 2017-02-16 — End: 2017-02-18

## 2017-02-16 MED ORDER — SENNOSIDES-DOCUSATE SODIUM 8.6 MG-50 MG TAB
Freq: Two times a day (BID) | ORAL | Status: DC
Start: 2017-02-16 — End: 2017-02-18
  Administered 2017-02-16 – 2017-02-18 (×6): via ORAL

## 2017-02-16 MED FILL — VITAMIN D3 25 MCG (1,000 UNIT) TABLET: 25 mcg (1,000 unit) | ORAL | Qty: 1

## 2017-02-16 MED FILL — GABAPENTIN 300 MG CAP: 300 mg | ORAL | Qty: 1

## 2017-02-16 MED FILL — ROCURONIUM 10 MG/ML IV: 10 mg/mL | INTRAVENOUS | Qty: 20

## 2017-02-16 MED FILL — PRAVASTATIN 20 MG TAB: 20 mg | ORAL | Qty: 2

## 2017-02-16 MED FILL — GABAPENTIN 300 MG CAP: 300 mg | ORAL | Qty: 2

## 2017-02-16 MED FILL — DAILY VITES/IRON TABLET: ORAL | Qty: 2

## 2017-02-16 MED FILL — ANECTINE 20 MG/ML INJECTION SOLUTION: 20 mg/mL | INTRAMUSCULAR | Qty: 100

## 2017-02-16 MED FILL — CITALOPRAM 20 MG TAB: 20 mg | ORAL | Qty: 2

## 2017-02-16 MED FILL — SENNA PLUS 8.6 MG-50 MG TABLET: ORAL | Qty: 1

## 2017-02-16 MED FILL — BD POSIFLUSH NORMAL SALINE 0.9 % INJECTION SYRINGE: INTRAMUSCULAR | Qty: 10

## 2017-02-16 MED FILL — FAMOTIDINE 20 MG TAB: 20 mg | ORAL | Qty: 1

## 2017-02-16 MED FILL — ONDANSETRON (PF) 4 MG/2 ML INJECTION: 4 mg/2 mL | INTRAMUSCULAR | Qty: 4

## 2017-02-16 MED FILL — CEFAZOLIN 2 GRAM/20 ML IN STERILE WATER INTRAVENOUS SYRINGE: 2 gram/0 mL | INTRAVENOUS | Qty: 20

## 2017-02-16 MED FILL — BUPROPION SR 150 MG TAB: 150 mg | ORAL | Qty: 1

## 2017-02-16 MED FILL — CALCIUM-CHOLECALCIFEROL (D3) 500 MG (1,250 MG)-200 UNIT TAB: 500 mg-200 unit | ORAL | Qty: 1

## 2017-02-16 MED FILL — PROPOFOL 10 MG/ML IV EMUL: 10 mg/mL | INTRAVENOUS | Qty: 1804.4

## 2017-02-16 MED FILL — LIDOCAINE (PF) 20 MG/ML (2 %) IJ SOLN: 20 mg/mL (2 %) | INTRAMUSCULAR | Qty: 60

## 2017-02-16 MED FILL — SODIUM CHLORIDE 0.9 % IV: INTRAVENOUS | Qty: 500

## 2017-02-16 MED FILL — CYCLOBENZAPRINE 10 MG TAB: 10 mg | ORAL | Qty: 1

## 2017-02-16 MED FILL — METOPROLOL SUCCINATE SR 50 MG 24 HR TAB: 50 mg | ORAL | Qty: 1

## 2017-02-16 MED FILL — OXYCODONE 5 MG TAB: 5 mg | ORAL | Qty: 1

## 2017-02-16 MED FILL — HYDROMORPHONE (PF) 2 MG/ML IJ SOLN: 2 mg/mL | INTRAMUSCULAR | Qty: 1

## 2017-02-16 MED FILL — FENTANYL 50 MCG/HR 72 HR TRANSDERM PATCH: 50 mcg/hr | TRANSDERMAL | Qty: 1

## 2017-02-16 MED FILL — DEXAMETHASONE SODIUM PHOSPHATE 4 MG/ML IJ SOLN: 4 mg/mL | INTRAMUSCULAR | Qty: 8

## 2017-02-16 MED FILL — POLYETHYLENE GLYCOL 3350 17 GRAM (100 %) ORAL POWDER PACKET: 17 gram | ORAL | Qty: 1

## 2017-02-16 NOTE — Progress Notes (Signed)
Problem: Mobility Impaired (Adult and Pediatric)  Goal: *Acute Goals and Plan of Care (Insert Text)  Physical Therapy Goals  Initiated 02/16/2017    1. Patient will move from supine to sit and sit to supine  in bed with minimal assistance/contact guard assist within 4 days.  2. Patient will perform sit to stand with minimal assistance/contact guard assist within 4 days.  3. Patient will ambulate with supervision/set-up for 150 feet with the least restrictive device within 4 days.  4. Patient will verbalize and demonstrate understanding of spinal precautions (No bending, lifting greater than 5 lbs, or twisting; log-roll technique; frequent repositioning as instructed) within 4 days.   physical Therapy TREATMENT  Patient: Natasha White (76 y.o. female)  Date: 02/16/2017  Precautions:    Chart, physical therapy assessment, plan of care and goals were reviewed.    ASSESSMENT:  Patient's husband and daughter in law present.  Asking appropriate questions regarding rehab after discharge.  Reviewed back precautions, patient with poor recall of precations.  Patient requires mod assist of two to donn brace.  Performed transfer training for rolling, sit to sidelying to supine, and sit <> stand with moderate assist of two and cues and demonstration for best technique.  Performed gait training with the rolling walker in the room with moderate assist of two.  Patient requires much cueing for technique and sequencing.  Returned to room and set patient up in bedside chair with chair alarm.  Patient denies nausea, dizziness, but reports increased pain (10 out of 10) with transfer back to bed.  ??  Progression toward goals:  [x]       Improving appropriately and progressing toward goals  []       Improving slowly and progressing toward goals  []       Not making progress toward goals and plan of care will be adjusted     PLAN:  Patient continues to benefit from skilled intervention to address the  above impairments.  Continue treatment per established plan of care.  Discharge Recommendations:  Rehab  Further Equipment Recommendations for Discharge:  tbd     SUBJECTIVE:   Patient stated ???My husband was here this afternoon.??? [patient stated at midday, with husband still present  The patient stated 0/3 back precautions. Reviewed all 3 with patient.    OBJECTIVE DATA SUMMARY:   Functional Mobility Training:  Bed Mobility:  Log Rolling: Maximum assistance;Assist x2  Supine to Sit: Moderate assistance;Assist x2  Sit to Supine: Maximum assistance;Assist x2  Scooting: Total assistance;Assist x2        Brace already donned.  Transfers:  Sit to Stand: Moderate assistance;Additional time;Assist x2  Stand to Sit: Minimum assistance;Assist x2;Additional time                             Ambulation/Gait Training:  Distance (ft): 12 Feet (ft)  Assistive Device: Walker, rolling;Gait belt;Brace/Splint  Ambulation - Level of Assistance: Moderate assistance        Gait Abnormalities: Decreased step clearance        Base of Support: Center of gravity altered     Speed/Cadence: Slow;Delayed                       Stairs:             Pain:  Pain Scale 1: Numeric (0 - 10)  Pain Intensity 1: 4  Pain Location 1: Back  Pain Orientation 1:  Posterior        Activity Tolerance:   Limited by pain  Please refer to the flowsheet for vital signs taken during this treatment.  After treatment:     Patient left in no apparent distress sitting up in chair    Patient left in no apparent distress in bed    Call bell left within reach    Nursing notified    Caregiver present    Bed alarm activated    COMMUNICATION/COLLABORATION:   The patient???s plan of care was discussed with: Registered Nurse and Social Worker    Brita Romp, PT   Time Calculation: 40 mins

## 2017-02-16 NOTE — Progress Notes (Signed)
02/16/2017  1:43 PM  Reason for Admission:   Elective - Lumbar stenosis                   RRAT Score:          7           Plan for utilizing home health:      PT recommends IPR/SNF. Pt preference indicated as [facility], [facility], [facility].                     Likelihood of Readmission:  Yellow                         Transition of Care Plan:                    CM met with pt for assessment. Demographics and PCP were confirmed. Pt is a 76 year old, Caucasian female who lives in a private residence with her husband (6 exterior steps, 1 flight interior with bedroom and full bathroom upstairs). Pt has not been able to climb interior steps for several weeks, and has been sleeping in a recliner. PTA, pt was able to complete ADLs with the use of a RW, rollator, and cane. Pt has prescription drug coverage, and prefers [facility] on [address]. Pt has prior HH with [facility] in 2014. Pt and pt's husband expressed concern in pt returning home as pt has limited mobility, and husband has limited ability to assist pt. Pt requested rehab, and indicated preferences as [facility], [facility], and [facility]. CM informed PT who agreed with this plan. CM completed referral and is awaiting response.     Janeal Holmes, MA    Care Management Interventions  PCP Verified by CM: Yes ([facility] - No NN to notify)  Palliative Care Criteria Met (RRAT>21 & CHF Dx)?: No  Mode of Transport at Discharge: Other (see comment) (TBD)  Transition of Care Consult (CM Consult): Discharge Planning  MyChart Signup: No  Discharge Durable Medical Equipment: No  Physical Therapy Consult: Yes  Occupational Therapy Consult: Yes  Current Support Network: Lives with Spouse  Confirm Follow Up Transport: Family  Plan discussed with Pt/Family/Caregiver: Yes  Discharge Location  Discharge Placement: Rehab hospital/unit acute

## 2017-02-16 NOTE — Progress Notes (Signed)
Spoke to Natasha White advised pt condition continue with previous orders to bolus and restart iv fluids. Hold Roxicodone. Will continue to monitor agree to consult  Hospitalist if  B/P does not improve or pt becomes symptomatic.

## 2017-02-16 NOTE — Progress Notes (Signed)
ORTHOPAEDIC LUMBAR FUSION PROGRESS NOTE    NAME:     Natasha White   DOB:       02/27/1941   MRN:       [id number]   DATE:      02/16/2017    POD:    1 Day Post-Op  S/P:    Procedure(s):  L3-L5 LAMINECTOMY, L4-L5 FUSION, INSTRUMENTATION, TLIF, BONE GRAFT    SUBJECTIVE:    C/O back pain along surgical incision  No leg pain or numbness  Denies nausea/vomiting, headache, chest pain or shortness of breath  Pain controlled    Recent Labs      02/16/17   0241   HGB  10.8*   NA  143   K  4.3   CL  105   CO2  31   BUN  17   CREA  0.98   GLU  139*     Patient Vitals for the past 12 hrs:   BP Temp Pulse Resp SpO2   02/16/17 0740 99/62 98.2 ??F (36.8 ??C) 67 14 98 %   02/16/17 0300 113/53 99.3 ??F (37.4 ??C) 63 16 97 %   02/15/17 2339 111/57 98.4 ??F (36.9 ??C) 67 16 96 %   02/15/17 2256 175/76 97.6 ??F (36.4 ??C) 71 18 97 %       EXAM:  Dressings clean, dry and intact   Positive strength/ROM bilat lower ext.  Neuro intact to sensation  Calves, soft & nontender  BL LEs NVID      PLAN:  Monitor hemovac output  D/C PCA, change to PO pain medications  PT/OT, OOB w/ assist  Advance diet as tolerated      Jolyn NapJessica D Urijah Raynor, PA  Orthopaedic Surgery  Physician Assistant to Dr. Norlene DuelJoseph Kim    5:14 PM  Spoke w/ RN Vikki PortsValerie. Pt has had some asymptomatic hypotension that improved w/ trendelenburg positioning. Will order 500 cc fluid bolus, after bolus will resume 125 cc/hr of IV fluids. Pt is not having any cp, sob, dizziness, syncope or palpitations. No hx of CHF noted in chart. Oxycodone frequency changed to every 4 hours prn, RN will limit narcotics as discussed. Will also order new BMP due to decreased urine output. Cr this AM was normal. Bladder scan showed 50 cc of residual. Awaiting lab results.     Jolyn NapJessica D Trayshawn Durkin, PA

## 2017-02-16 NOTE — Progress Notes (Signed)
Occupational Therapy Note:    Orders acknowledged and chart reviewed. RN requesting defer of OT session at this time as pt is hypotensive. Will continue to follow and attempt again as able. Thank you.    Jaymes GraffSarah Hancock, OTR/L

## 2017-02-16 NOTE — Progress Notes (Signed)
Consult to ortho pa [facility] advised pt b/p 84/41  Pt drowsy but otherwise asymptomatic. Receive order for 250 bolus then  Restart iv fluids NS at 125/hr, if no improvement within 2 hours consult hospitalist.

## 2017-02-16 NOTE — Progress Notes (Addendum)
1640: low BP, pt asymptomatic but hasn't voided, B. Scan <100cc: PA Elijio Miles informed: see orders    Bedside and Verbal shift change report given to Sharon/Ellie (oncoming nurse) by vmc (offgoing nurse). Report included the following information SBAR, Kardex, Procedure Summary, Intake/Output, MAR and Recent Results.

## 2017-02-16 NOTE — Progress Notes (Signed)
Problem: Mobility Impaired (Adult and Pediatric)  Goal: *Acute Goals and Plan of Care (Insert Text)  Physical Therapy Goals  Initiated 02/16/2017    1. Patient will move from supine to sit and sit to supine  in bed with minimal assistance/contact guard assist within 4 days.  2. Patient will perform sit to stand with minimal assistance/contact guard assist within 4 days.  3. Patient will ambulate with supervision/set-up for 150 feet with the least restrictive device within 4 days.  4. Patient will verbalize and demonstrate understanding of spinal precautions (No bending, lifting greater than 5 lbs, or twisting; log-roll technique; frequent repositioning as instructed) within 4 days.  physical Therapy EVALUATION  Patient: Natasha White (76 y.o. 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  At baseline she uses a walker in her home and was unable to go up stairs and was sleeping in a recliner on the first floor.  She stated she was able to go down steps, and has had one fall in the past year.  She lives in a two story home with her husband and has no other assistance.    Patient is generally sleepy.  Slow to respond.  Follows one step commands with a delay.  Reviewed back precautions and use of back brace.  Patient requires mod assist of two to donn brace.  Performed transfer training for rolling, supine to sit, and sit <> stand with moderate assist of two and cues and demonstration for best technique.  Performed gait training with the rolling walker in the room with moderate assist of two.  Returned to  room and set patient up in bedside chair with chair alarm.  Patient denies nausea, dizziness, but reports increased pain (9 out of 10) with mobility.    Patient will benefit from skilled intervention to address the above impairments.  Patient???s rehabilitation potential is considered to be Good  Factors which may influence rehabilitation potential include:   []          None noted  [x]          Mental ability/status  [x]          Medical condition  []          Home/family situation and support systems  []          Safety awareness  [x]          Pain tolerance/management  []          Other:      PLAN :  Recommendations and Planned Interventions:  [x]            Bed Mobility Training             []     Neuromuscular Re-Education  [x]            Transfer Training                   []     Orthotic/Prosthetic Training  [x]            Gait Training                         []   Modalities             Therapeutic Exercises               Edema Management/Control             Therapeutic Activities                Patient and Family Training/Education             Other (comment):    Frequency/Duration: Patient will be followed by physical therapy  twice daily to address goals.  Discharge Recommendations: Rehab  Further Equipment Recommendations for Discharge: tbd     SUBJECTIVE:   Patient stated ???I feel like I am falling [while walking].???  No buckling but shifts her weight without moving her feet    OBJECTIVE DATA SUMMARY:   HISTORY:    Past Medical History:   Diagnosis Date   ??? Arrhythmia     PVCs   ??? Arthritis    ??? Bilateral lower extremity edema     Takes Bumex PO daily PRN.   ??? 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 Chronic pain     back/ left hip   ??? GERD (gastroesophageal reflux disease)    ??? High cholesterol    ??? Hypertension    ??? Nausea & vomiting     scopalamine patch helps   ??? Obesity (BMI 30-39.9) 02/07/2017    BMI= 35.6   ??? Psychiatric disorder     depression/ anxiety   ??? Vertigo      Past Surgical History:    Procedure Laterality Date   ??? HX BREAST BIOPSY      benign   ??? HX HEART CATHETERIZATION  1998    no blockages   ??? HX HYSTERECTOMY  1987    also removed ovaries   ??? HX KNEE REPLACEMENT Left 2014    had surgery after a fall at rehab- ? revision   ??? TOTAL KNEE ARTHROPLASTY Right 2009, 2012     Prior Level of Function/Home Situation: At baseline she uses a walker in her home and was unable to go up stairs and was sleeping in a recliner on the first floor.  She stated she was able to go down steps, and has had one fall in the past year.  She lives in a two story home with her husband and has no other assistance.    Personal factors and/or comorbidities impacting plan of care:     Home Situation  Home Environment: Private residence  # Steps to Enter: 6  One/Two Story Residence: Two Nutritional therapist of Interior Steps: 14  Lift Chair Available: No  Living Alone: No  Support Systems: Games developer, Family member(s)  Patient Expects to be Discharged to:: Private residence  Current DME Used/Available at Home: Dan Humphreys, Environmental consultant, rollator, Medical laboratory scientific officer, straight  EXAMINATION/PRESENTATION/DECISION MAKING: Critical Behavior:  Neurologic State: Drowsy, Eyes open to voice  Orientation Level: Oriented to person, Disoriented to time, Disoriented to place, Oriented to situation  Cognition: Follows commands, Decreased attention/concentration     Hearing:  Auditory  Auditory Impairment: None  Hearing Aids/Status: Does not own  Range Of Motion:  AROM: Generally decreased, functional           PROM: Generally decreased, functional           Strength:    Strength: Generally decreased, functional  Tone & Sensation:                  Sensation: Impaired               Coordination:  Coordination: Generally decreased, functional  Vision:      Functional Mobility:  Bed Mobility:  Rolling: Maximum assistance;Assist x2  Supine to Sit: Moderate assistance;Assist x2        Transfers:   Sit to Stand: Moderate assistance;Assist x2;Additional time  Stand to Sit: Minimum assistance;Assist x2;Additional time                       Balance:   Sitting: Intact  Standing: Intact;With supportAmbulation/Gait Training:Distance (ft): 12 Feet (ft)  Assistive Device: Walker, rolling;Gait belt;Brace/Splint  Ambulation - Level of Assistance: Moderate assistance;Assist x2;Additional time;Adaptive equipment        Gait Abnormalities: Antalgic        Base of Support: Widened;Center of gravity altered     Speed/Cadence: Slow;Delayed                      Stairs:              Functional Measure:  Barthel Index:    Bathing: 0  Bladder: 0  Bowels: 10  Grooming: 5  Dressing: 0  Feeding: 10  Mobility: 0  Stairs: 0  Toilet Use: 0  Transfer (Bed to Chair and Back): 5  Total: 30      Barthel and G-code impairment scale:  Percentage of impairment CH  0% CI  1-19% CJ  20-39% CK  40-59% CL  60-79% CM  80-99% CN  100%   Barthel Score 0-100 100 99-80 79-60 59-40 20-39 1-19   0   Barthel Score 0-20 20 17-19 13-16 9-12 5-8 1-4 0      The Barthel ADL Index: Guidelines  1. The index should be used as a record of what a patient does, not as a record of what a patient could do.  2. The main aim is to establish degree of independence from any help, physical or verbal, however minor and for whatever reason.  3. The need for supervision renders the patient not independent.  4. A patient's performance should be established using the best available evidence. Asking the patient, friends/relatives and nurses are the usual sources, but direct observation and common sense are also important. However direct testing is not needed.  5. Usually the patient's performance over the preceding 24-48 hours is important, but occasionally longer periods will be relevant.  6. Middle categories imply that the patient supplies over 50 per cent of the effort.  7. Use of aids to be independent is allowed.     Clarisa Kindred., Barthel, D.W. [phone]). Functional evaluation: the Barthel Index. Md State Med J (14)2.  Zenaida Niece der [place], J.J.M.F, [place], Ian Malkin., Margret Chance., [place], [place]. (1999). Measuring the change indisability after inpatient rehabilitation; comparison of the responsiveness of the Barthel Index and Functional Independence Measure. Journal of Neurology, Neurosurgery, and Psychiatry, 66(4), [phone].  Dawson Bills, N.J.A, Scholte op [place],  W.J.M, & Koopmanschap, M.A. (2004.) Assessment of post-stroke quality of life in cost-effectiveness studies: The usefulness of the Barthel Index and the EuroQoL-5D. Quality of Life Research, 13, 536-64     G codes:  In compliance with CMS???s Claims Based Outcome Reporting, the following G-code set was chosen for this patient based on their primary functional limitation being treated:    The  outcome measure chosen to determine the severity of the functional limitation was the Barthel with a score of 30/100 which was correlated with the impairment scale.    ? Mobility - Walking and Moving Around:    [phone] - CURRENT STATUS: CL - 60%-79% impaired, limited or restricted   G8979 - GOAL STATUS: CK - 40%-59% impaired, limited or restricted   U0454 - D/C STATUS:  ---------------To be determined---------------      Physical Therapy Evaluation Charge Determination   History Examination Presentation Decision-Making   LOW Complexity : Zero comorbidities / personal factors that will impact the outcome / POC LOW Complexity : 1-2 Standardized tests and measures addressing body structure, function, activity limitation and / or participation in recreation  LOW Complexity : Stable, uncomplicated  LOW Complexity : FOTO score of 75-100      Based on the above components, the patient evaluation is determined to be of the following complexity level: LOW     Pain:  Pain Scale 1: Numeric (0 - 10)  Pain Intensity 1: 4  Pain Location 1: Back  Pain Orientation 1: Posterior        Activity Tolerance:    Limited by pain and lethargy  Please refer to the flowsheet for vital signs taken during this treatment.  After treatment:            Patient left in no apparent distress sitting up in chair           Patient left in no apparent distress in bed           Call bell left within reach           Nursing notified           Caregiver present           Chair alarm activated    COMMUNICATION/EDUCATION:   The patient???s plan of care was discussed with: Registered Nurse and Child psychotherapist.           Fall prevention education was provided and the patient/caregiver indicated understanding.           Patient/family have participated as able in goal setting and plan of care.           Patient/family agree to work toward stated goals and plan of care.           Patient understands intent and goals of therapy, but is neutral about his/her participation.           Patient is unable to participate in goal setting and plan of care.    Thank you for this referral.  Brita Romp, PT   Time Calculation: 34 mins

## 2017-02-17 LAB — METABOLIC PANEL, BASIC
Anion gap: 5 mmol/L (ref 5–15)
BUN/Creatinine ratio: 18 (ref 12–20)
BUN: 14 MG/DL (ref 6–20)
CO2: 29 mmol/L (ref 21–32)
Calcium: 8.3 MG/DL — ABNORMAL LOW (ref 8.5–10.1)
Chloride: 105 mmol/L (ref 97–108)
Creatinine: 0.78 MG/DL (ref 0.55–1.02)
GFR est AA: >60 mL/min/{1.73_m2} (ref >60)
GFR est non-AA: >60 mL/min/{1.73_m2} (ref >60)
Glucose: 113 mg/dL — ABNORMAL HIGH (ref 65–100)
Potassium: 3.8 mmol/L (ref 3.5–5.1)
Sodium: 139 mmol/L (ref 136–145)

## 2017-02-17 LAB — HEMOGLOBIN: HGB: 7.8 g/dL — ABNORMAL LOW (ref 11.5–16.0)

## 2017-02-17 LAB — LACTIC ACID: Lactic acid: 0.6 MMOL/L (ref 0.4–2.0)

## 2017-02-17 MED ORDER — METOPROLOL SUCCINATE SR 25 MG 24 HR TAB
25 mg | Freq: Every day | ORAL | Status: DC
Start: 2017-02-17 — End: 2017-02-18
  Administered 2017-02-17 – 2017-02-18 (×2): via ORAL

## 2017-02-17 MED ORDER — SODIUM CHLORIDE 0.9% BOLUS IV
0.9 % | Freq: Once | INTRAVENOUS | Status: AC
Start: 2017-02-17 — End: 2017-02-17
  Administered 2017-02-17: 04:00:00 via INTRAVENOUS

## 2017-02-17 MED ORDER — SODIUM CHLORIDE 0.9 % IV
INTRAVENOUS | Status: DC
Start: 2017-02-17 — End: 2017-02-18
  Administered 2017-02-17 (×2): via INTRAVENOUS

## 2017-02-17 MED ORDER — HYDROCODONE-ACETAMINOPHEN 5 MG-325 MG TAB
5-325 mg | ORAL | Status: DC | PRN
Start: 2017-02-17 — End: 2017-02-18
  Administered 2017-02-17 – 2017-02-18 (×2): via ORAL

## 2017-02-17 MED ORDER — DEXAMETHASONE SODIUM PHOSPHATE 4 MG/ML IJ SOLN
4 mg/mL | Freq: Once | INTRAMUSCULAR | Status: AC
Start: 2017-02-17 — End: 2017-02-17
  Administered 2017-02-17: 22:00:00 via INTRAVENOUS

## 2017-02-17 MED FILL — BUPROPION SR 150 MG TAB: 150 mg | ORAL | Qty: 1

## 2017-02-17 MED FILL — SODIUM CHLORIDE 0.9 % IV: INTRAVENOUS | Qty: 250

## 2017-02-17 MED FILL — HYDROCODONE-ACETAMINOPHEN 5 MG-325 MG TAB: 5-325 mg | ORAL | Qty: 1

## 2017-02-17 MED FILL — GABAPENTIN 300 MG CAP: 300 mg | ORAL | Qty: 1

## 2017-02-17 MED FILL — PRAVASTATIN 20 MG TAB: 20 mg | ORAL | Qty: 2

## 2017-02-17 MED FILL — FAMOTIDINE 20 MG TAB: 20 mg | ORAL | Qty: 1

## 2017-02-17 MED FILL — CITALOPRAM 20 MG TAB: 20 mg | ORAL | Qty: 2

## 2017-02-17 MED FILL — METOPROLOL SUCCINATE SR 25 MG 24 HR TAB: 25 mg | ORAL | Qty: 1

## 2017-02-17 MED FILL — DEXAMETHASONE SODIUM PHOSPHATE 4 MG/ML IJ SOLN: 4 mg/mL | INTRAMUSCULAR | Qty: 3

## 2017-02-17 MED FILL — SENNA PLUS 8.6 MG-50 MG TABLET: ORAL | Qty: 1

## 2017-02-17 MED FILL — GABAPENTIN 300 MG CAP: 300 mg | ORAL | Qty: 2
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
## 2017-02-17 NOTE — Progress Notes (Signed)
Problem: Mobility Impaired (Adult and Pediatric)  Goal: *Acute Goals and Plan of Care (Insert Text)  Physical Therapy Goals  Initiated 02/16/2017    1. Patient will move from supine to sit and sit to supine  in bed with minimal assistance/contact guard assist within 4 days.  2. Patient will perform sit to stand with minimal assistance/contact guard assist within 4 days.  3. Patient will ambulate with supervision/set-up for 150 feet with the least restrictive device within 4 days.  4. Patient will verbalize and demonstrate understanding of spinal precautions (No bending, lifting greater than 5 lbs, or twisting; log-roll technique; frequent repositioning as instructed) within 4 days.   physical Therapy TREATMENT  Patient: Natasha White (76 y.o. 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    ASSESSMENT:  Reviewed back precautions and use of back brace.  Patient requires mod assist to donn brace.  Performed transfer training for sit <> stand with moderate assist of one and verbal cues and demonstration for best technique.  Performed gait training with the rolling walker into the hall with moderate assist of one.  Patient requires much assist with walker management and continuous cues for walker placement.  Returned to room and set patient up in bedside chair with chair alarm.  Patient denies nausea and dizziness with mobility.  Much pain including hip and knee joint audible grinding with gait.  Progression toward goals:        Improving appropriately and progressing toward goals        Improving slowly and progressing toward goals         Not making progress toward goals and plan of care will be adjusted     PLAN:  Patient continues to benefit from skilled intervention to address the above impairments.  Continue treatment per established plan of care.  Discharge Recommendations:  Rehab  Further Equipment Recommendations for Discharge:  tbd     SUBJECTIVE:   Patient stated ???Can I sit now?.??? Patient about three feet from the chair and reaching for it with her hand off of the walker  The patient stated 0/3 back precautions. Reviewed all 3 with patient.    OBJECTIVE DATA SUMMARY:   Critical Behavior:  Neurologic State: Drowsy, Eyes open to voice  Orientation Level: Oriented X4  Cognition: Appropriate safety awareness, Follows commands, Appropriate decision making, Decreased attention/concentration     Functional Mobility Training:  Bed Mobility:  Log                   Brace donned with  moderate assistance  Of one  Transfers:  Sit to Stand: Minimum assistance;Assist x1;Additional time  Stand to Sit: Moderate assistance;Assist x1;Additional time                             Balance:     Ambulation/Gait Training:  Distance (ft): 30 Feet (ft)  Assistive Device: Walker, rolling;Gait belt;Brace/Splint  Ambulation - Level of Assistance: Moderate assistance;Assist x1;Additional time        Gait Abnormalities: Decreased step clearance;Antalgic;Step to gait        Base of Support: Center of gravity altered;Widened  Speed/Cadence: Slow;Delayed                       Stairs:             Pain:                    Activity Tolerance:   Limited by weakness, pain and decreased/delayed command follow  Please refer to the flowsheet for vital signs taken during this treatment.  After treatment:   [x]   Patient left in no apparent distress sitting up in chair  []   Patient left in no apparent distress in bed  [x]   Call bell left within reach  [x]   Nursing notified  []   Caregiver present  []   Bed alarm activated    COMMUNICATION/COLLABORATION:    The patient???s plan of care was discussed with: Occupational Therapist, Registered Nurse and Social Worker    Brita Romp, PT   Time Calculation: 24 mins

## 2017-02-17 NOTE — Consults (Signed)
Already done. Please see two previous notes from 02/17/17

## 2017-02-17 NOTE — Consults (Signed)
[facility]  [address] [place], [place]  [address]  [phone]  Hospitalist Consult Note    NAME:  Natasha White   DOB:   04-26-1941   MRN:  [id number]     Requesting Physician Norlene Duel, MD   Reason for Consult:  hypotension     PCP:  Claudette Stapler, MD     Date/Time:  02/17/2017 5:05 AM       Assessment and Plan:      Lumbar stenosis with neurogenic claudication / Chronic pain / Arthritis - Tolerated extensive repair surgery.  Ortho will be attending to pain control, DVT prophylaxis and rehab decisions as usual.  Gabapentin, percocet, fentanyl per surgery.  I would avoid NSAIDs.    Hypotension / HTN - Noted after surgery.  Improving. Likely related to surgery and narcotic pain meds.  Continue to hydrate. Reduce metoprolol dose. Stop bumex.    Anemia - Monitor.  Will worsen with hydration.    Anxiety and depression - Continue buproprion and citalopram    GERD (gastroesophageal reflux disease) - Add PPI if symptoms    High cholesterol - Hold pravastatin until better    Obesity - Advise weight loss        Subjective:     CHIEF COMPLAINT: back pain     HISTORY OF PRESENT ILLNESS:     Natasha White is a 77 y.o.  Caucasian female who is admitted to the Ortho Service with back pain, post surgery.  We are asked to evaluate for hypotension. Patient tolerated surgery without complications.  Felt well except for pain, prior to surgery.  Takes medications consistently at home.  Pre-op labs were unremarkable aside from possible UTI.  We will follow along.     Past Medical History:   Diagnosis Date   ??? Anxiety and depression    ??? Arrhythmia     PVCs   ??? Arthritis    ??? Bilateral lower extremity edema     Takes Bumex PO daily PRN.   ??? 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 Chronic pain     back/ left hip   ??? GERD (gastroesophageal reflux disease)    ??? High cholesterol    ??? Hypertension    ??? Nausea & vomiting     scopalamine patch helps   ??? Obesity (BMI 30-39.9) 02/07/2017    BMI= 35.6    ??? Psychiatric disorder     depression/ anxiety   ??? Vertigo         Past Surgical History:   Procedure Laterality Date   ??? HX BREAST BIOPSY      benign   ??? HX HEART CATHETERIZATION  1998    no blockages   ??? HX HYSTERECTOMY  1987    also removed ovaries   ??? HX KNEE REPLACEMENT Left 2014    had surgery after a fall at rehab- ? revision   ??? TOTAL KNEE ARTHROPLASTY Right 2009, 2012       Social History   Substance Use Topics   ??? Smoking status: Former Smoker     Packs/day: 0.20     Years: 3.00     Quit date: 02/08/1964   ??? Smokeless tobacco: Never Used   ??? Alcohol use 4.2 oz/week     7 Glasses of wine per week        Family History   Problem Relation Age of Onset   ??? Hypertension Mother    ???  Cancer Mother      pancreatic   ??? Post-op Nausea/Vomiting Mother    ??? Lung Cancer Father    ??? MS Sister    ??? Hypertension Sister         Allergies   Allergen Reactions   ??? Codeine Nausea Only        Prior to Admission medications    Medication Sig Start Date End Date Taking? Authorizing Provider   metoprolol succinate (TOPROL-XL) 50 mg XL tablet Take 50 mg by mouth Daily (before breakfast).   Yes Historical Provider   pravastatin (PRAVACHOL) 40 mg tablet Take 40 mg by mouth nightly.   Yes Historical Provider   buPROPion SR [facility](WELLBUTRIN SR) 150 mg SR tablet Take 150 mg by mouth two (2) times a day.   Yes Historical Provider   citalopram (CELEXA) 40 mg tablet Take 40 mg by mouth Daily (before breakfast).   Yes Historical Provider   gabapentin (NEURONTIN) 300 mg capsule Take 300 mg by mouth Daily (before breakfast).   Yes Historical Provider   gabapentin (NEURONTIN) 600 mg tablet Take 600 mg by mouth nightly.   Yes Historical Provider   oxyCODONE-acetaminophen (PERCOCET) 7.5-325 mg per tablet Take 1 Tab by mouth every eight (8) hours as needed for Pain. Indications: Pain   Yes Historical Provider   bumetanide (BUMEX PO) Take 1 mg by mouth daily as needed.   Yes Historical Provider    aspirin delayed-release 81 mg tablet Take 81 mg by mouth Daily (before breakfast).    Historical Provider   fentaNYL (DURAGESIC) 50 mcg/hr PATCH 1 Patch by TransDERmal route every seventy-two (72) hours.    Historical Provider   cholecalciferol (VITAMIN D3) 1,000 unit tablet Take 1,000 Units by mouth daily.    Historical Provider   calcium-cholecalciferol, d3, (CALCIUM 600 + D) 600-125 mg-unit tab Take 1 Tab by mouth daily.    Historical Provider   multivitamin with minerals (HAIR,SKIN AND NAILS PO) Take 2 Tabs by mouth daily.    Historical Provider   LORazepam (ATIVAN) 1 mg tablet Take 1 mg by mouth daily as needed for Anxiety.    Historical Provider   ibuprofen (ADVIL) 200 mg tablet Take 200 mg by mouth every six (6) hours as needed for Pain. Takes 2-3 tablets    Historical Provider         Current Facility-Administered Medications:   ???  metoprolol succinate (TOPROL-XL) XL tablet 25 mg, 25 mg, Oral, ACB, Salvadore DomMichael E Zeeshan Korte, MD  ???  influenza vaccine 2018-19 (6 mos+)(PF) (FLUARIX QUAD/FLULAVAL QUAD) injection 0.5 mL, 0.5 mL, IntraMUSCular, PRIOR TO DISCHARGE, Norlene DuelJoseph Kim, MD  ???  0.9% sodium chloride infusion, 125 mL/hr, IntraVENous, CONTINUOUS, Renita PapaAmanda Russell WilleyGraupman, [place], Last Rate: 125 mL/hr at 02/16/17 2315, 125 mL/hr at 02/16/17 2315  ???  HYDROcodone-acetaminophen (NORCO) 5-325 mg per tablet 1 Tab, 1 Tab, Oral, Q4H PRN, Princella PellegriniJessica D Womack, PA  ???  fentaNYL (DURAGESIC) 50 mcg/hr patch 1 Patch, 1 Patch, TransDERmal, Q72H, Norlene DuelJoseph Kim, MD, 1 Patch at 02/15/17 2238  ???  LORazepam (ATIVAN) tablet 1 mg, 1 mg, Oral, DAILY PRN, Norlene DuelJoseph Kim, MD  ???  gabapentin (NEURONTIN) capsule 300 mg, 300 mg, Oral, ACB, Norlene DuelJoseph Kim, MD, 300 mg at 02/16/17 0640  ???  pravastatin (PRAVACHOL) tablet 40 mg, 40 mg, Oral, QHS, Norlene DuelJoseph Kim, MD, 40 mg at 02/16/17 2143  ???  buPROPion SR [facility](WELLBUTRIN SR) tablet 150 mg, 150 mg, Oral, BID, Norlene DuelJoseph Kim, MD, 150 mg at 02/16/17 2141  ???  citalopram (CELEXA) tablet 40 mg, 40 mg, Oral, ACB, Norlene Duel, MD, 40  mg at 02/16/17 0640  ???  benzocaine-menthol (CEPACOL) lozenge 1 Lozenge, 1 Lozenge, Mucous Membrane, PRN, Norlene Duel, MD  ???  sodium chloride (NS) flush 5-10 mL, 5-10 mL, IntraVENous, Q8H, Norlene Duel, MD, 10 mL at 02/16/17 1345  ???  sodium chloride (NS) flush 5-10 mL, 5-10 mL, IntraVENous, PRN, Norlene Duel, MD  ???  naloxone [facility]) injection 0.4 mg, 0.4 mg, IntraVENous, PRN, Norlene Duel, MD  ???  senna-docusate (PERICOLACE) 8.6-50 mg per tablet 1 Tab, 1 Tab, Oral, BID, Norlene Duel, MD, 1 Tab at 02/16/17 1756  ???  polyethylene glycol (MIRALAX) packet 17 g, 17 g, Oral, DAILY, Norlene Duel, MD, 17 g at 02/16/17 0847  ???  bisacodyl (DULCOLAX) suppository 10 mg, 10 mg, Rectal, DAILY PRN, Norlene Duel, MD  ???  acetaminophen (TYLENOL) tablet 650 mg, 650 mg, Oral, Q6H PRN, Norlene Duel, MD  ???  ondansetron [facility]) injection 4 mg, 4 mg, IntraVENous, Q6H PRN, Norlene Duel, MD  ???  famotidine (PEPCID) tablet 20 mg, 20 mg, Oral, BID, Norlene Duel, MD, 20 mg at 02/16/17 1756  ???  diphenhydrAMINE (BENADRYL) injection 12.5 mg, 12.5 mg, IntraVENous, Q6H PRN, Norlene Duel, MD  ???  scopolamine (TRANSDERM-SCOP) 1 mg over 3 days 1 Patch, 1 Patch, TransDERmal, Q72H, Arliss Journey, MD, 1 Patch at 02/15/17 1645  ???  gabapentin (NEURONTIN) capsule 600 mg, 600 mg, Oral, QHS, Norlene Duel, MD, 600 mg at 02/16/17 2143  ???  bumetanide (BUMEX) tablet 1 mg, 1 mg, Oral, DAILY PRN, Norlene Duel, MD    Review of Systems:  (bold if positive, if negative)    Gen:  Eyes:  ENT:  CVS:  Pulm:  GI:    GU:    MS:  Pain, weakness, arthritisSkin:  Psych:  Endo:    Hem:  Renal:    Neuro:            Objective:      VITALS:    Vital signs reviewed; most recent are:    Visit Vitals   ??? BP 109/51 (BP 1 Location: Left arm, BP Patient Position: Head of bed elevated (Comment degrees))   ??? Pulse 71   ??? Temp 99.1 ??F (37.3 ??C)   ??? Resp 15   ??? Wt 94.3 kg (207 lb 14.3 oz)   ??? SpO2 91%   ??? Breastfeeding No   ??? BMI 35.68 kg/m2     SpO2 Readings from Last 6 Encounters:   02/17/17 91%   02/07/17 93%     O2 Flow Rate (L/min): 3 l/min     Intake/Output Summary (Last 24 hours) at 02/17/17 0505  Last data filed at 02/17/17 1610   Gross per 24 hour   Intake                0 ml   Output              480 ml   Net             -480 ml      All Micro Results     Procedure Component Value Units Date/Time    CULTURE, URINE [[id number]]     Order Status:  Sent Specimen:  Urine from Cath Urine             Exam:     Physical Exam:    Gen:  Obese, in no acute distress  HEENT:  Pink  conjunctivae, PERRL, hearing intact to voice, moist mucous membranes  Neck:  Supple, without masses, thyroid non-tender  Resp:  No accessory muscle use, clear breath sounds without wheezes rales or rhonchi  Card:  No murmurs, normal S1, S2 without thrills, bruits or peripheral edema  Abd:  Soft, non-tender, non-distended, normoactive bowel sounds are present, no mass  Lymph:  No cervical or inguinal adenopathy  Musc:  No cyanosis or clubbing, post op pain and limited ROM  Skin:  No rashes or ulcers, skin turgor is good  Neuro:  Cranial nerves are grossly intact, post op pain and motor weakness, follows commands   Psych:  Good insight, oriented to person, place and time, alert       Labs:    Recent Labs      02/16/17   0241   HGB  10.8*     Recent Labs      02/16/17   1851   NA  138   K  4.5   CL  105   CO2  28   GLU  136*   BUN  18   CREA  0.99   CA  8.2*     No results found for: GLUCPOC  No results for input(s): PH, PCO2, PO2, HCO3, FIO2 in the last 72 hours.  No results for input(s): INR in the last 72 hours.    No lab exists for component: INREXT    I have reviewed previous records,    Telemetry reviewed:   normal sinus rhythm    Risk of deterioration: high      Total time spent with patient: 45 minutes                  Care Plan discussed with: Patient, Nursing Staff and >50% of time spent in counseling and coordination of care    Discussed:  Care Plan       ___________________________________________________     Attending Physician: Salvadore Dom, MD

## 2017-02-17 NOTE — Progress Notes (Signed)
Problem: Self Care Deficits Care Plan (Adult)  Goal: *Acute Goals and Plan of Care (Insert Text)  Occupational Therapy Goals  Initiated 02/17/2017    1.  Patient will perform lower body dressing with supervision/set-up using Reacher, Stocking Aid and Long [employer] PRN within 7 days.  2.  Patient will perform toileting with minimal assistance using most appropriate DME within 7 days.    3.  Patient will grooming standing at sink at supervision/set-up within 7 days.   4.  Patient will don/doff back brace at supervision/set-up within 7 days.  5.  Patient will verbalize/demonstrate 3/3 back precautions during ADL tasks without cues within 7 days.   Occupational Therapy EVALUATION  Patient: Natasha White (76 y.o. 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 Unable to eval POD 1 secondary to hypotension.  Nursing cleared for therapy.  Received sitting in chair with husband present.  She lives at home with him.  PTA mod indep with amb at rollator/RW level and ADL tasks however acknowledges decreased mobility and indep prior to surgery.  Sleeps in recliner, recommend discuss with surgeon regarding recliner use post op.  Verbalized 2/3 back precautions with education on ADL tasks within back precautions.  Attempt to amb to bathroom however patient unable to progress secondary to pain and BLE  weakness.  Sit > stand with mod A with flexed posture which she reports is her baseline.  Additional time needed to complete marching in place and one step forward and on step back.  Pain increase to 8/10, returned to sitting.  Patient unable to complete crossed leg for LB dressing, has dressing tools but husband has been assisting.     BP stable.  No dizziness reported.  BP sitting pre-activity: 100/48, HR 74.  Sitting post activity: 130/60, HR 67, SpO2 94% on RA.      Patient is presenting below her baseline requiring max A for LB care, max A for toileting tasks, and min-mod A for UB care including donning/doffing brace. Recommend inpatient rehab once medically stable for additional rehab to return to PLOF. Recommend next sessions to progress to toileting in bathroom and patient would like education on dressing stick.        Patient will benefit from skilled intervention to address the above impairments.  Patient???s rehabilitation potential is considered to be Good  Factors which may influence rehabilitation potential include:                None noted               Mental ability/status               Medical condition               Home/family situation and support systems               Safety awareness    Pain tolerance/management  []              Other:      PLAN :  Recommendations and Planned Interventions:  [x]                Self Care Training                  [x]         Therapeutic Activities  [x]                Functional Mobility Training    []         Cognitive Retraining  [x]                Therapeutic Exercises           [x]         Endurance Activities  [x]                Balance Training                   []         Neuromuscular Re-Education  []                Visual/Perceptual Training     [x]    Home Safety Training  [x]                Patient Education                 [x]         Family Training/Education  []                Other (comment):     Frequency/Duration: Patient will be followed by occupational therapy 5 times a week to address goals.  Discharge Recommendations: Inpatient Rehab  Further Equipment Recommendations for Discharge: TBD     SUBJECTIVE:   Patient stated ???I was able to get up and shuffle along.???    OBJECTIVE DATA SUMMARY:   HISTORY:   Past Medical History:   Diagnosis Date   ??? Anxiety and depression    ??? Arrhythmia     PVCs   ??? Arthritis    ??? Bilateral lower extremity edema     Takes Bumex PO daily PRN.   ??? 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 Chronic pain     back/ left hip   ??? GERD (gastroesophageal reflux disease)    ??? High cholesterol    ??? Hypertension    ??? Nausea & vomiting     scopalamine patch helps   ??? Obesity (BMI 30-39.9) 02/07/2017    BMI= 35.6   ??? Psychiatric disorder     depression/ anxiety   ??? Vertigo      Past Surgical History:   Procedure Laterality Date   ??? HX BREAST BIOPSY      benign   ??? HX HEART CATHETERIZATION  1998    no blockages   ??? HX HYSTERECTOMY  1987    also removed ovaries   ??? HX KNEE REPLACEMENT Left 2014    had surgery after a fall at rehab- ? revision   ??? TOTAL KNEE ARTHROPLASTY Right 2009, 2012     Prior Level of Function/Environment/Context: Home with husband.  See assessment secontion  Expanded or extensive additional review of patient history:     Home Situation  Home Environment: Private residence  # Steps to Enter: 6  One/Two Story Residence: Two story  # of Interior Steps:  14  Lift Chair Available: No  Living Alone: No  Support Systems: Spouse/Significant Other/Partner  Patient Expects to be Discharged to:: Private residence  Current DME Used/Available at Home: Lift chair, Cane, straight, Environmental consultant, rolling, Adaptive dressing aides    Hand dominance: Right    EXAMINATION OF PERFORMANCE DEFICITS:  Cognitive/Behavioral Status:  Neurologic State: Alert  Orientation Level: Oriented X4  Cognition: Appropriate safety awareness;Follows commands;Appropriate decision making;Decreased attention/concentration         Hearing:  Auditory  Auditory Impairment: None  Hearing Aids/Status: Does not own    Vision/Perceptual:                           Acuity: Within Defined Limits         Range of Motion:  AROM: Generally decreased, functional  PROM: Generally decreased, functional                      Strength:  Strength: Generally decreased, functional                Coordination:  Coordination: Generally decreased, functional  Fine Motor Skills-Upper: Left Intact;Right Intact    Gross Motor Skills-Upper: Left Intact;Right Intact    Tone & Sensation:  Sensation: Impaired                      Balance:  Sitting: Intact  Standing: With support  Standing - Static: Fair  Standing - Dynamic : Fair    Functional Mobility and Transfers for ADLs:  Bed Mobility:  Rolling:  (received up and left up in chiar)    Transfers:  Sit to Stand: Moderate assistance  Stand to Sit: Moderate assistance  Toilet Transfer :  (unable to progress)    ADL Assessment:  Feeding: Independent    Oral Facial Hygiene/Grooming: Supervision (seated)    Bathing: Maximum assistance    Upper Body Dressing: Moderate assistance    Lower Body Dressing: Maximum assistance;Adaptive equipment    Toileting: Maximum assistance (for basic SPT to [facility])                ADL Intervention and task modifications:     Patient instructed and indicated understanding the benefits of maintaining activity tolerance, functional mobility, and independence with self care tasks during acute stay  to ensure safe return home and to baseline. Encouraged patient to increase frequency and duration OOB, not sitting longer than 30 mins without marching/walking with staff, be out of bed for all meals, perform daily ADLs (as approved by RN/MD regarding bathing etc), and performing functional mobility to/from bathroom. Patient instruction and indicated understanding on body mechanics, ergonomics and gravitational force on the spine during different body positions to plan  activities in prep for return home to complete basic ADLs, instrumental ADLs and back to work safely.   Dressing brace: Patient instructed and demonstrated to don/doff velcro on brace prior to completion of ADL tasks and wearing when OOB.  Dressing lower body: Patient instructed to don brace first and on the benefits to remain seated to don all clothing to increase independence with precautions and pain management. Patient instructed and unable to complete tailor sitting for lower body dressing.  Owns Chief Executive Officer.    Ed on body mechanics and hand placement with sit > stand completed with mod A with flexed posture.  She reports is her baseline.  With additional time, able to complete weight shfiting then  marching in place and then one step forward and one back.  Impaired BLE strength and pain elevating to 8/10, returned to sitting.  Unable to progress to bathroom.     Home safety: Patient instructed and indicated understanding on home modifications and safety [raise height of ADL objects (i.e. clothing, sink items, fridge items, items to mouth when grooming), appropriate height of chair surfaces, recliner safety, change of floor surfaces, clear pathways] to increase independence and fall prevention.        Functional Measure:  Barthel Index:    Bathing: 0  Bladder: 5  Bowels: 10  Grooming: 0  Dressing: 0  Feeding: 10  Mobility: 0  Stairs: 0  Toilet Use: 0  Transfer (Bed to Chair and Back): 10  Total: 35       Barthel and G-code impairment scale:  Percentage of impairment CH  0% CI  1-19% CJ  20-39% CK  40-59% CL  60-79% CM  80-99% CN  100%   Barthel Score 0-100 100 99-80 79-60 59-40 20-39 1-19   0   Barthel Score 0-20 20 17-19 13-16 9-12 5-8 1-4 0      The Barthel ADL Index: Guidelines  1. The index should be used as a record of what a patient does, not as a record of what a patient could do.  2. The main aim is to establish degree of independence from any help,  physical or verbal, however minor and for whatever reason.  3. The need for supervision renders the patient not independent.  4. A patient's performance should be established using the best available evidence. Asking the patient, friends/relatives and nurses are the usual sources, but direct observation and common sense are also important. However direct testing is not needed.  5. Usually the patient's performance over the preceding 24-48 hours is important, but occasionally longer periods will be relevant.  6. Middle categories imply that the patient supplies over 50 per cent of the effort.  7. Use of aids to be independent is allowed.    Clarisa Kindred., Barthel, D.W. [phone]). Functional evaluation: the Barthel Index. Md State Med J (14)2.  Zenaida Niece der Aristes, J.J.M.F, [place], Ian Malkin., Margret Chance., [place], [place]. (1999). Measuring the change indisability after inpatient rehabilitation; comparison of the responsiveness of the Barthel Index and Functional Independence Measure. Journal of Neurology, Neurosurgery, and Psychiatry, 66(4), [phone].  Dawson Bills, N.J.A, [place],  W.J.M, & Koopmanschap, M.A. (2004.) Assessment of post-stroke quality of life in cost-effectiveness studies: The usefulness of the Barthel Index and the EuroQoL-5D. Quality of Life Research, 13, 098-11         G codes:  In compliance with CMS???s Claims Based Outcome Reporting, the following G-code set was chosen for this patient based on their primary functional limitation being treated:    The outcome measure chosen to determine the severity of the functional limitation was the Bartehl index with a score of 35/100 which was correlated with the impairment scale.    ? Self Care:    [phone] - CURRENT STATUS: CL - 60%-79% impaired, limited or restricted   [id number] - GOAL STATUS: CK - 40%-59% impaired, limited or restricted   Z3086 - D/C STATUS:  ---------------To be determined---------------     Occupational Therapy Evaluation Charge Determination    History Examination Decision-Making   LOW Complexity : Brief history review  LOW Complexity : 1-3 performance deficits relating to physical, cognitive , or psychosocial skils that result in activity limitations and / or participation  restrictions  LOW Complexity : No comorbidities that affect functional and no verbal or physical assistance needed to complete eval tasks       Based on the above components, the patient evaluation is determined to be of the following complexity level: LOW   Pain:  Pain Scale 1: Numeric (0 - 10)  Pain Intensity 1: 6  Pain Location 1: Hip;Knee  Pain Orientation 1: Left  Pain Description 1: Sharp  Pain Intervention(s) 1: Medication (see MAR)  Activity Tolerance:   BP in assessment section    After treatment:    Patient left in no apparent distress sitting up in chair   Patient left in no apparent distress in bed   Call bell left within reach   Nursing notified   Caregiver present   Bed alarm under patient, no alarm box present- husband present and reports he will ensure patient does not get up without assistance    COMMUNICATION/EDUCATION:   The patient???s plan of care was discussed with: Physical Therapist, Registered Nurse and Social Worker.   Home safety education was provided and the patient/caregiver indicated understanding.   Patient/family have participated as able in goal setting and plan of care.   Patient/family agree to work toward stated goals and plan of care.   Patient understands intent and goals of therapy, but is neutral about his/her participation.   Patient is unable to participate in goal setting and plan of care.  This patient???s plan of care is appropriate for delegation to OTA.    Thank you for this referral.  Janeece Agee, OT  Time Calculation: 16 mins

## 2017-02-17 NOTE — Progress Notes (Addendum)
02/17/2017     3:01 PM   CM received acceptance from [facility] for admission on 02/18/17. Pt and attending notified.     2:23 PM  Referrals still pending.

## 2017-02-17 NOTE — Progress Notes (Signed)
Ortho Spine    Pt with radicular left leg pain. This was present prior to surgery and slightly improved since surgery. But patient states still rates it 6-7/10 pain. Will trial Decadron 10mg  IV x 1 dose. Monitor.     Natasha HaltJessica S Saga Balthazar, NP

## 2017-02-17 NOTE — Progress Notes (Signed)
ORTHOPAEDIC LUMBAR FUSION PROGRESS NOTE  ??  NAME:     Natasha White   DOB:         08/13/1940   MRN:         [id number]   DATE:      02/17/2017  ??  POD:    2 Days Post-Op  S/P:      Procedure(s):  L3-L5 LAMINECTOMY, L4-L5 FUSION, INSTRUMENTATION, TLIF, BONE GRAFT  ??  SUBJECTIVE:    C/O back pain along surgical incision  No leg pain or numbness  Denies nausea/vomiting, headache, chest pain or shortness of breath  Pain controlled  ??  Labs and Vitals reviewed  ??  EXAM:  Dressings clean, dry and intact   Positive strength/ROM bilat lower ext.  Neuro intact to sensation  Calves, soft & nontender  BL LEs NVID  ??  ??  PLAN:  Continue prn PO pain medications  PT/OT, OOB w/ assist  Tolerating diet  Hospitalist also following patient  ??  ??  Jolyn Nap, PA  Orthopaedic Surgery  Physician Assistant to Dr. Norlene Duel

## 2017-02-17 NOTE — Progress Notes (Addendum)
Patient assisted x2 and walker to the restroom, successfully urinated, but not measured.  Post void bladder scan showed 395 ml remaining.  Patient does not report feeling like she has to urinate more.  Purwick placed for accurate measurement.    2300 - Patient again assisted x2 and walker to the restroom, urinated successfully.  in suction canister from purwick, plus urine not measured in toilet.

## 2017-02-17 NOTE — Progress Notes (Signed)
Bedside and Verbal shift change report given to Catherine, RN (oncoming nurse) by Alexandra Altano, RN (offgoing nurse). Report included the following information SBAR, Kardex, Procedure Summary, Intake/Output, MAR and Recent Results

## 2017-02-17 NOTE — Progress Notes (Signed)
Hospitalist consulted Dr. Esperanza Sheets [phone] . Pt not voided since [id number] post -foley removal. Bladder scan 112 ml.  Pt states daily dose of Bumex at home. Previous blood pressure at 1057 84/41   fluid bolus 250 ml Given. Last vitals at 0235 121/66. At this time hold Bumex , no additional orders, Continue to monitor.

## 2017-02-17 NOTE — Progress Notes (Signed)
Bedside and Verbal shift change report given to Amy,RN (oncoming nurse) by Michel SanteeSharon Harris,RN (offgoing nurse). Report included the following information SBAR, Procedure Summary, Intake/Output, MAR and Recent Results.

## 2017-02-17 NOTE — Progress Notes (Signed)
Natasha White discussed with Dr. Lennie OdorMuphy. Hypotension related to pain meds/anesthesia/antihypertensives/loops in setting of decreased intravascular volume. Agree with holding bumex until seen by PCP after discharge. Can resume home toprol-XL dose at discharge to rehab. For now, continue IVF hydration. Will follow-up tomorrow for final discharge recommendations.    Face-to-face time: 21 minutes      Non-billing visit

## 2017-02-17 NOTE — Consults (Signed)
Already done. Please see two previous notes from 02/17/17

## 2017-02-17 NOTE — Consults (Signed)
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  Ortho will be attending to pain control, DVT prophylaxis and rehab decisions as usual.  Gabapentin, percocet, fentanyl per surgery.  I would avoid NSAIDs.    Hypotension / HTN - Noted after surgery.  Improving. Likely related to surgery and narcotic pain meds.  Continue to hydrate. Reduce metoprolol dose. Stop bumex.    Anemia - Monitor.  Will worsen with hydration.    Anxiety and depression - Continue buproprion and citalopram    GERD (gastroesophageal reflux disease) - Add PPI if symptoms    High cholesterol - Hold pravastatin until better    Obesity - Advise weight loss        Subjective:     CHIEF COMPLAINT: back pain     HISTORY OF PRESENT ILLNESS:     Ms. Pletz is a 76 y.o.  Caucasian female who is admitted to the Ortho Service with back pain, post surgery.  We are asked to evaluate for hypotension. Patient tolerated surgery without complications.  Felt well except for pain, prior to surgery.  Takes medications consistently at home.  Pre-op labs were unremarkable aside from possible UTI.  We will follow along.     Past Medical History:   Diagnosis Date   ??? Anxiety and depression    ??? Arrhythmia     PVCs   ??? Arthritis    ??? Bilateral lower extremity edema     Takes Bumex PO daily PRN.   ??? 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 Chronic pain     back/ left hip   ??? GERD (gastroesophageal reflux disease)    ??? High cholesterol    ??? Hypertension    ??? Nausea & vomiting     scopalamine patch helps   ??? Obesity (BMI 30-39.9) 02/07/2017    BMI= 35.6   ??? Psychiatric  disorder     depression/ anxiety   ??? Vertigo         Past Surgical History:   Procedure Laterality Date   ??? HX BREAST BIOPSY      benign   ??? HX HEART CATHETERIZATION  1998    no blockages   ??? HX HYSTERECTOMY  1987    also removed ovaries   ??? HX KNEE REPLACEMENT Left 2014    had surgery after a fall at rehab- ? revision   ??? TOTAL KNEE ARTHROPLASTY Right 2009, 2012       Social History   Substance Use Topics   ??? Smoking status: Former Smoker     Packs/day: 0.20     Years: 3.00     Quit date: 02/08/1964   ??? Smokeless tobacco: Never Used   ??? Alcohol use 4.2 oz/week     7 Glasses of wine per week        Family History   Problem Relation Age of Onset   ???  Hypertension Mother    ??? Cancer Mother      pancreatic   ??? Post-op Nausea/Vomiting Mother    ??? Lung Cancer Father    ??? MS Sister    ??? Hypertension Sister         Allergies   Allergen Reactions   ??? Codeine Nausea Only        Prior to Admission medications    Medication Sig Start Date End Date Taking? Authorizing Provider   metoprolol succinate (TOPROL-XL) 50 mg XL tablet Take 50 mg by mouth Daily (before breakfast).   Yes Historical Provider   pravastatin (PRAVACHOL) 40 mg tablet Take 40 mg by mouth nightly.   Yes Historical Provider   buPROPion SR [facility] SR) 150 mg SR tablet Take 150 mg by mouth two (2) times a day.   Yes Historical Provider   citalopram (CELEXA) 40 mg tablet Take 40 mg by mouth Daily (before breakfast).   Yes Historical Provider   gabapentin (NEURONTIN) 300 mg capsule Take 300 mg by mouth Daily (before breakfast).   Yes Historical Provider   gabapentin (NEURONTIN) 600 mg tablet Take 600 mg by mouth nightly.   Yes Historical Provider   oxyCODONE-acetaminophen (PERCOCET) 7.5-325 mg per tablet Take 1 Tab by mouth every eight (8) hours as needed for Pain. Indications: Pain   Yes Historical Provider   bumetanide (BUMEX PO) Take 1 mg by mouth daily as needed.   Yes Historical Provider   aspirin delayed-release 81 mg tablet Take 81 mg by mouth Daily (before  breakfast).    Historical Provider   fentaNYL (DURAGESIC) 50 mcg/hr PATCH 1 Patch by TransDERmal route every seventy-two (72) hours.    Historical Provider   cholecalciferol (VITAMIN D3) 1,000 unit tablet Take 1,000 Units by mouth daily.    Historical Provider   calcium-cholecalciferol, d3, (CALCIUM 600 + D) 600-125 mg-unit tab Take 1 Tab by mouth daily.    Historical Provider   multivitamin with minerals (HAIR,SKIN AND NAILS PO) Take 2 Tabs by mouth daily.    Historical Provider   LORazepam (ATIVAN) 1 mg tablet Take 1 mg by mouth daily as needed for Anxiety.    Historical Provider   ibuprofen (ADVIL) 200 mg tablet Take 200 mg by mouth every six (6) hours as needed for Pain. Takes 2-3 tablets    Historical Provider         Current Facility-Administered Medications:   ???  metoprolol succinate (TOPROL-XL) XL tablet 25 mg, 25 mg, Oral, ACB, Salvadore Dom, MD  ???  influenza vaccine 2018-19 (6 mos+)(PF) (FLUARIX QUAD/FLULAVAL QUAD) injection 0.5 mL, 0.5 mL, IntraMUSCular, PRIOR TO DISCHARGE, Norlene Duel, MD  ???  0.9% sodium chloride infusion, 125 mL/hr, IntraVENous, CONTINUOUS, Renita Papa [place], [place], Last Rate: 125 mL/hr at 02/16/17 2315, 125 mL/hr at 02/16/17 2315  ???  HYDROcodone-acetaminophen (NORCO) 5-325 mg per tablet 1 Tab, 1 Tab, Oral, Q4H PRN, Princella Pellegrini Womack, PA  ???  fentaNYL (DURAGESIC) 50 mcg/hr patch 1 Patch, 1 Patch, TransDERmal, Q72H, Norlene Duel, MD, 1 Patch at 02/15/17 2238  ???  LORazepam (ATIVAN) tablet 1 mg, 1 mg, Oral, DAILY PRN, Norlene Duel, MD  ???  gabapentin (NEURONTIN) capsule 300 mg, 300 mg, Oral, ACB, Norlene Duel, MD, 300 mg at 02/16/17 0640  ???  pravastatin (PRAVACHOL) tablet 40 mg, 40 mg, Oral, QHS, Norlene Duel, MD, 40 mg at 02/16/17 2143  ???  buPROPion SR [facility] SR) tablet 150 mg, 150 mg, Oral, BID, Norlene Duel, MD, 150 mg  at 02/16/17 2141  ???  citalopram (CELEXA) tablet 40 mg, 40 mg, Oral, ACB, Norlene Duel, MD, 40 mg at 02/16/17 0640  ???  benzocaine-menthol (CEPACOL) lozenge 1 Lozenge, 1  Lozenge, Mucous Membrane, PRN, Norlene Duel, MD  ???  sodium chloride (NS) flush 5-10 mL, 5-10 mL, IntraVENous, Q8H, Norlene Duel, MD, 10 mL at 02/16/17 1345  ???  sodium chloride (NS) flush 5-10 mL, 5-10 mL, IntraVENous, PRN, Norlene Duel, MD  ???  naloxone [facility]) injection 0.4 mg, 0.4 mg, IntraVENous, PRN, Norlene Duel, MD  ???  senna-docusate (PERICOLACE) 8.6-50 mg per tablet 1 Tab, 1 Tab, Oral, BID, Norlene Duel, MD, 1 Tab at 02/16/17 1756  ???  polyethylene glycol (MIRALAX) packet 17 g, 17 g, Oral, DAILY, Norlene Duel, MD, 17 g at 02/16/17 0847  ???  bisacodyl (DULCOLAX) suppository 10 mg, 10 mg, Rectal, DAILY PRN, Norlene Duel, MD  ???  acetaminophen (TYLENOL) tablet 650 mg, 650 mg, Oral, Q6H PRN, Norlene Duel, MD  ???  ondansetron [facility]) injection 4 mg, 4 mg, IntraVENous, Q6H PRN, Norlene Duel, MD  ???  famotidine (PEPCID) tablet 20 mg, 20 mg, Oral, BID, Norlene Duel, MD, 20 mg at 02/16/17 1756  ???  diphenhydrAMINE (BENADRYL) injection 12.5 mg, 12.5 mg, IntraVENous, Q6H PRN, Norlene Duel, MD  ???  scopolamine (TRANSDERM-SCOP) 1 mg over 3 days 1 Patch, 1 Patch, TransDERmal, Q72H, Arliss Journey, MD, 1 Patch at 02/15/17 1645  ???  gabapentin (NEURONTIN) capsule 600 mg, 600 mg, Oral, QHS, Norlene Duel, MD, 600 mg at 02/16/17 2143  ???  bumetanide (BUMEX) tablet 1 mg, 1 mg, Oral, DAILY PRN, Norlene Duel, MD    Review of Systems:  (bold if positive, if negative)    Gen:  Eyes:  ENT:  CVS:  Pulm:  GI:    GU:    MS:  Pain, weakness, arthritisSkin:  Psych:  Endo:    Hem:  Renal:    Neuro:            Objective:      VITALS:    Vital signs reviewed; most recent are:    Visit Vitals   ??? BP 109/51 (BP 1 Location: Left arm, BP Patient Position: Head of bed elevated (Comment degrees))   ??? Pulse 71   ??? Temp 99.1 ??F (37.3 ??C)   ??? Resp 15   ??? Wt 94.3 kg (207 lb 14.3 oz)   ??? SpO2 91%   ??? Breastfeeding No   ??? BMI 35.68 kg/m2     SpO2 Readings from Last 6 Encounters:   02/17/17 91%   02/07/17 93%    O2 Flow Rate (L/min): 3 l/min     Intake/Output Summary (Last 24 hours)  at 02/17/17 0505  Last data filed at 02/17/17 8469   Gross per 24 hour   Intake                0 ml   Output              480 ml   Net             -480 ml      All Micro Results     Procedure Component Value Units Date/Time    CULTURE, URINE [[id number]]     Order Status:  Sent Specimen:  Urine from Cath Urine             Exam:     Physical Exam:    Gen:  Obese, in no  acute distress  HEENT:  Pink conjunctivae, PERRL, hearing intact to voice, moist mucous membranes  Neck:  Supple, without masses, thyroid non-tender  Resp:  No accessory muscle use, clear breath sounds without wheezes rales or rhonchi  Card:  No murmurs, normal S1, S2 without thrills, bruits or peripheral edema  Abd:  Soft, non-tender, non-distended, normoactive bowel sounds are present, no mass  Lymph:  No cervical or inguinal adenopathy  Musc:  No cyanosis or clubbing, post op pain and limited ROM  Skin:  No rashes or ulcers, skin turgor is good  Neuro:  Cranial nerves are grossly intact, post op pain and motor weakness, follows commands   Psych:  Good insight, oriented to person, place and time, alert       Labs:    Recent Labs      02/16/17   0241   HGB  10.8*     Recent Labs      02/16/17   1851   NA  138   K  4.5   CL  105   CO2  28   GLU  136*   BUN  18   CREA  0.99   CA  8.2*     No results found for: GLUCPOC  No results for input(s): PH, PCO2, PO2, HCO3, FIO2 in the last 72 hours.  No results for input(s): INR in the last 72 hours.    No lab exists for component: INREXT    I have reviewed previous records,    Telemetry reviewed:   normal sinus rhythm    Risk of deterioration: high      Total time spent with patient: 45 minutes                  Care Plan discussed with: Patient, Nursing Staff and >50% of time spent in counseling and coordination of care    Discussed:  Care Plan       ___________________________________________________    Attending Physician: Salvadore Dom, MD

## 2017-02-18 ENCOUNTER — Inpatient Hospital Stay

## 2017-02-18 LAB — URINALYSIS W/MICROSCOPIC
Bacteria: NEGATIVE /hpf
Bilirubin: NEGATIVE
Blood: NEGATIVE
Glucose: 100 mg/dL — AB
Ketone: NEGATIVE mg/dL
Nitrites: NEGATIVE
Protein: NEGATIVE mg/dL
Specific gravity: 1.013 (ref 1.003–1.030)
Urobilinogen: 0.2 EU/dL (ref 0.2–1.0)
pH (UA): 6 (ref 5.0–8.0)

## 2017-02-18 LAB — METABOLIC PANEL, BASIC
Anion gap: 5 mmol/L (ref 5–15)
BUN/Creatinine ratio: 14 (ref 12–20)
BUN: 9 MG/DL (ref 6–20)
CO2: 28 mmol/L (ref 21–32)
Calcium: 8.6 MG/DL (ref 8.5–10.1)
Chloride: 108 mmol/L (ref 97–108)
Creatinine: 0.65 MG/DL (ref 0.55–1.02)
GFR est AA: >60 mL/min/{1.73_m2} (ref >60)
GFR est non-AA: >60 mL/min/{1.73_m2} (ref >60)
Glucose: 159 mg/dL — ABNORMAL HIGH (ref 65–100)
Potassium: 4.2 mmol/L (ref 3.5–5.1)
Sodium: 141 mmol/L (ref 136–145)

## 2017-02-18 LAB — HEMOGLOBIN: HGB: 7.5 g/dL — ABNORMAL LOW (ref 11.5–16.0)

## 2017-02-18 MED ORDER — HYDROCODONE-ACETAMINOPHEN 5 MG-325 MG TAB
5-325 mg | ORAL_TABLET | ORAL | 0 refills | Status: AC | PRN
Start: 2017-02-18 — End: ?

## 2017-02-18 MED ORDER — SENNOSIDES-DOCUSATE SODIUM 8.6 MG-50 MG TAB
ORAL_TABLET | Freq: Two times a day (BID) | ORAL | 2 refills | Status: AC | PRN
Start: 2017-02-18 — End: ?

## 2017-02-18 MED ORDER — NALOXONE 4 MG/ACTUATION NASAL SPRAY
4 mg/actuation | NASAL | 5 refills | Status: AC | PRN
Start: 2017-02-18 — End: ?

## 2017-02-18 MED FILL — METOPROLOL SUCCINATE SR 25 MG 24 HR TAB: 25 mg | ORAL | Qty: 1

## 2017-02-18 MED FILL — POLYETHYLENE GLYCOL 3350 17 GRAM (100 %) ORAL POWDER PACKET: 17 gram | ORAL | Qty: 1

## 2017-02-18 MED FILL — BUPROPION SR 150 MG TAB: 150 mg | ORAL | Qty: 1

## 2017-02-18 MED FILL — FAMOTIDINE 20 MG TAB: 20 mg | ORAL | Qty: 1

## 2017-02-18 MED FILL — CITALOPRAM 20 MG TAB: 20 mg | ORAL | Qty: 2

## 2017-02-18 MED FILL — GABAPENTIN 300 MG CAP: 300 mg | ORAL | Qty: 2

## 2017-02-18 MED FILL — PRAVASTATIN 20 MG TAB: 20 mg | ORAL | Qty: 2

## 2017-02-18 MED FILL — GABAPENTIN 300 MG CAP: 300 mg | ORAL | Qty: 1

## 2017-02-18 MED FILL — SENNA PLUS 8.6 MG-50 MG TABLET: ORAL | Qty: 1

## 2017-02-18 MED FILL — HYDROCODONE-ACETAMINOPHEN 5 MG-325 MG TAB: 5-325 mg | ORAL | Qty: 1

## 2017-02-18 NOTE — Progress Notes (Signed)
Bedside and Verbal shift change report given to Jasmine December RN/Tori RN (oncoming nurse) by Otelia Santee RN (offgoing nurse). Report included the following information SBAR, Kardex, MAR and Recent Results.

## 2017-02-18 NOTE — Progress Notes (Signed)
ORTHOPAEDIC LUMBAR FUSION PROGRESS NOTE    NAME:     Natasha White   DOB:       06/06/1940   MRN:       [id number]   DATE:      02/18/2017    POD:    3 Days Post-Op  S/P:    Procedure(s):  L3-L5 LAMINECTOMY, L4-L5 FUSION, INSTRUMENTATION, TLIF, BONE GRAFT    SUBJECTIVE:    C/O back pain along surgical incision  No leg pain or numbness  Denies nausea/vomiting, headache, chest pain or shortness of breath  Pain controlled    Recent Labs      02/18/17   0658   HGB  7.5*   NA  141   K  4.2   CL  108   CO2  28   BUN  9   CREA  0.65   GLU  159*     Patient Vitals for the past 12 hrs:   BP Temp Pulse Resp SpO2   02/18/17 0703 118/56 97.7 ??F (36.5 ??C) 72 16 95 %   02/18/17 0702 118/56 - 72 - -   02/18/17 0345 128/64 97.3 ??F (36.3 ??C) 72 16 96 %   02/17/17 2314 117/69 98.4 ??F (36.9 ??C) 78 14 94 %       EXAM:  Dressings clean, dry and intact   Positive strength/ROM bilat lower ext.  Neuro intact to sensation  Calves, soft & nontender  BL LEs NVID      PLAN:  Continue prn PO pain medications  PT/OT, OOB w/ assist  Tolerating diet  Appreciate Hospitalist also following patient  Likely d/c to Rehab today or tomorrow      Jolyn NapJessica D Glen Blatchley, PA  Orthopaedic Surgery  Physician Assistant to Dr. Norlene DuelJoseph Kim

## 2017-02-18 NOTE — Progress Notes (Signed)
Telephonic report given to Marcelino Duster, Charity fundraiser at [employer]. SBAR, MAR, Vitals, Surgical and Health History, Plan and Labs discussed.

## 2017-02-18 NOTE — Progress Notes (Signed)
02/18/2017  8:52 AM  Pt discharge noted. [facility] can accept pt today around 11:00am. Nurses, please call report to [phone]. No additional DC service needs indicated.

## 2017-02-18 NOTE — Progress Notes (Signed)
Problem: Falls - Risk of  Goal: *Absence of Falls  Document Schmid Fall Risk and appropriate interventions in the flowsheet.   Outcome: Progressing Towards Goal  Fall Risk Interventions:  Mobility Interventions: Bed/chair exit alarm, Patient to call before getting OOB, Utilize walker, cane, or other assistive device, Utilize gait belt for transfers/ambulation         Medication Interventions: Bed/chair exit alarm, Patient to call before getting OOB, Teach patient to arise slowly, Utilize gait belt for transfers/ambulation    Elimination Interventions: Bed/chair exit alarm, Call light in reach, Patient to call for help with toileting needs, Toileting schedule/hourly rounds    History of Falls Interventions: Bed/chair exit alarm, Room close to nurse's station, Utilize gait belt for transfer/ambulation        Problem: Pressure Injury - Risk of  Goal: *Prevention of pressure injury  Document Braden Scale and appropriate interventions in the flowsheet.   Outcome: Progressing Towards Goal  Pressure Injury Interventions:  Sensory Interventions: Assess changes in LOC, Float heels, Keep linens dry and wrinkle-free, Pressure redistribution bed/mattress (bed type)    Moisture Interventions: Check for incontinence Q2 hours and as needed, Internal/External urinary devices    Activity Interventions: Increase time out of bed, Pressure redistribution bed/mattress(bed type), PT/OT evaluation    Mobility Interventions: HOB 30 degrees or less, Pressure redistribution bed/mattress (bed type), PT/OT evaluation    Nutrition Interventions: Document food/fluid/supplement intake, Offer support with meals,snacks and hydration

## 2017-02-18 NOTE — Progress Notes (Signed)
Bedside and Verbal shift change report given to Seward SpeckWendy March,RN (oncoming nurse) by Michel SanteeSharon Harris,RN (offgoing nurse). Report included the following information SBAR, Kardex, Procedure Summary, Intake/Output, MAR and Recent Results.

## 2017-02-18 NOTE — Progress Notes (Signed)
Chart reviewed. Patient is medically appropriate for discharge. Hold bumex until seen by PCP and continue metoprolol at home dosage (adjusted med rec to reflect this).

## 2017-02-18 NOTE — Discharge Summary (Signed)
DISCHARGE SUMMARY     Patient: Natasha White                             Medical Record Number: [id number]                DOB: July 26, 1940  Age: 76 y.o.  Admit Date: 02/15/2017  Discharge Date: 02/18/2017  Admission Diagnosis: Lumbar adjacent segment disease with spondylolisthesis [M51.36, M43.16]  Lumbar stenosis with neurogenic claudication [M48.062]  Discharge Diagnosis: Lumbar adjacent segment disease with spondylolisthesis   Procedures: Procedure(s):  L3-L5 LAMINECTOMY, L4-L5 FUSION, INSTRUMENTATION, TLIF, BONE GRAFT  Surgeon: Norlene Duel, MD  Co-surgeon:   Assistants:   Anesthesia: General  Complications: None     History of Present Illness:  Natasha White is a 76 y.o. female with a history of intractable low back and radiculopathy.  Despite conservative management and after clinical and radiographic evaluation, it was determined that she suffered from lumbar stenosis  and lumbar spondylolisthesis and would benefit from Procedure(s):  L3-L5 LAMINECTOMY, L4-L5 FUSION, INSTRUMENTATION, TLIF, BONE GRAFT, which she consented to undergo after a discussion of the risks, benefits, alternatives, rehab concerns, and potential complications of surgery.    Hospital Course:  Natasha White tolerated the procedure well.  She was transferred  to the recovery room in stable condition.  After a brief stay, the patient was then transferred to the Orthopedic floor.  On postoperative day #1, the dressing was clean and dry and she was neurovascularly intact. The patient was afebrile and vital signs were stable.  Calves were soft and non-tender bilaterally. Natasha White made excellent progress with physical therapy and was discharged to [facility] in stable condition on postoperative day 3.  She was provided with routine postoperative instructions and advised to follow up in my office in 3 weeks following discharge from the hospital.  She was given prescriptions for medication to control post-operative symptoms.    Discharge Medications:   Discharge Medication List as of 02/18/2017 12:04 PM      START taking these medications    Details   HYDROcodone-acetaminophen (NORCO) 5-325 mg per tablet Take 1 Tab by mouth every four (4) hours as needed (for breakthrough pain). Max Daily Amount: 6 Tabs., Print, Disp-42 Tab, R-0      senna-docusate (PERICOLACE) 8.6-50 mg per tablet Take 1 Tab by mouth two (2) times daily as needed for Constipation., Print, Disp-60 Tab, R-2      naloxone (NARCAN) 4 mg/actuation nasal spray 1 Spray by IntraNASal route as needed. Apply 1 nasal spray to one nostril; may repeat every 2 to 3 minutes in alternating nostrils until medical assistance becomes available  Indications: OPIATE-INDUCED RESPIRATORY DEPRESSION, Opioid Toxicity, Print, Dis p-2 Each, R-5         CONTINUE these medications which have NOT CHANGED    Details   metoprolol succinate (TOPROL-XL) 50 mg XL tablet Take 50 mg by mouth Daily (before breakfast)., Historical Med      pravastatin (PRAVACHOL) 40 mg tablet Take 40 mg by mouth nightly., Historical Med      buPROPion SR (WELLBUTRIN SR) 150 mg SR tablet Take 150 mg by mouth two (2) times a day., Historical Med      citalopram (CELEXA) 40 mg tablet Take 40 mg by mouth Daily (before breakfast)., Historical Med      fentaNYL (DURAGESIC) 50 mcg/hr PATCH 1 Patch by TransDERmal route every seventy-two (72) hours., Historical Med  cholecalciferol (VITAMIN D3) 1,000 unit tablet Take 1,000 Units by mouth daily., Historical Med      calcium-cholecalciferol, d3, (CALCIUM 600 + D) 600-125 mg-unit tab Take 1 Tab by mouth daily., Historical Med      gabapentin (NEURONTIN) 300 mg capsule Take 300 mg by mouth Daily (before breakfast)., Historical Med      gabapentin (NEURONTIN) 600 mg tablet Take 600 mg by mouth nightly., Historical Med      LORazepam (ATIVAN) 1 mg tablet Take 1 mg by mouth daily as needed for Anxiety., Historical Med         STOP taking these medications       aspirin delayed-release 81 mg tablet Comments:    Reason for Stopping:         multivitamin with minerals (HAIR,SKIN AND NAILS PO) Comments:   Reason for Stopping:         oxyCODONE-acetaminophen (PERCOCET) 7.5-325 mg per tablet Comments:   Reason for Stopping:         ibuprofen (ADVIL) 200 mg tablet Comments:   Reason for Stopping:         bumetanide (BUMEX PO) Comments:   Reason for Stopping:               Jolyn NapJessica D Shontavia Mickel, PA  02/18/2017  Orthopaedic Surgery  Physician Assistant to Dr. Norlene DuelJoseph Kim

## 2017-02-18 NOTE — Discharge Summary (Signed)
DISCHARGE SUMMARY     Patient: Natasha White                             Medical Record Number: [id number]                DOB: 11/21/1940  Age: 76 y.o.  Admit Date: 02/15/2017  Discharge Date: 02/18/2017  Admission Diagnosis: Lumbar adjacent segment disease with spondylolisthesis [M51.36, M43.16]  Lumbar stenosis with neurogenic claudication [M48.062]  Discharge Diagnosis: Lumbar adjacent segment disease with spondylolisthesis   Procedures: Procedure(s):  L3-L5 LAMINECTOMY, L4-L5 FUSION, INSTRUMENTATION, TLIF, BONE GRAFT  Surgeon: Norlene DuelJoseph Kim, MD  Co-surgeon:   Assistants:   Anesthesia: General  Complications: None     History of Present Illness:  Natasha White is a 76 y.o. female with a history of intractable low back and radiculopathy.  Despite conservative management and after clinical and radiographic evaluation, it was determined that she suffered from lumbar stenosis  and lumbar spondylolisthesis and would benefit from Procedure(s):  L3-L5 LAMINECTOMY, L4-L5 FUSION, INSTRUMENTATION, TLIF, BONE GRAFT, which she consented to undergo after a discussion of the risks, benefits, alternatives, rehab concerns, and potential complications of surgery.    Hospital Course:  Natasha White tolerated the procedure well.  She was transferred  to the recovery room in stable condition.  After a brief stay, the patient was then transferred to the Orthopedic floor.  On postoperative day #1, the dressing was clean and dry and she was neurovascularly intact. The patient was afebrile and vital signs were stable.  Calves were soft and non-tender bilaterally. Natasha White made excellent progress with physical therapy and was discharged to [facility] in stable condition on postoperative day 3.  She was provided with routine postoperative instructions and advised to follow up in my office in 3 weeks following discharge from the hospital.  She was given prescriptions for medication to control post-operative symptoms.    Discharge  Medications:  Discharge Medication List as of 02/18/2017 12:04 PM      START taking these medications    Details   HYDROcodone-acetaminophen (NORCO) 5-325 mg per tablet Take 1 Tab by mouth every four (4) hours as needed (for breakthrough pain). Max Daily Amount: 6 Tabs., Print, Disp-42 Tab, R-0      senna-docusate (PERICOLACE) 8.6-50 mg per tablet Take 1 Tab by mouth two (2) times daily as needed for Constipation., Print, Disp-60 Tab, R-2      naloxone (NARCAN) 4 mg/actuation nasal spray 1 Spray by IntraNASal route as needed. Apply 1 nasal spray to one nostril; may repeat every 2 to 3 minutes in alternating nostrils until medical assistance becomes available  Indications: OPIATE-INDUCED RESPIRATORY DEPRESSION, Opioid Toxicity, Print, Dis p-2 Each, R-5         CONTINUE these medications which have NOT CHANGED    Details   metoprolol succinate (TOPROL-XL) 50 mg XL tablet Take 50 mg by mouth Daily (before breakfast)., Historical Med      pravastatin (PRAVACHOL) 40 mg tablet Take 40 mg by mouth nightly., Historical Med      buPROPion SR (WELLBUTRIN SR) 150 mg SR tablet Take 150 mg by mouth two (2) times a day., Historical Med      citalopram (CELEXA) 40 mg tablet Take 40 mg by mouth Daily (before breakfast)., Historical Med      fentaNYL (DURAGESIC) 50 mcg/hr PATCH 1 Patch by TransDERmal route every seventy-two (72) hours., Historical Med  cholecalciferol (VITAMIN D3) 1,000 unit tablet Take 1,000 Units by mouth daily., Historical Med      calcium-cholecalciferol, d3, (CALCIUM 600 + D) 600-125 mg-unit tab Take 1 Tab by mouth daily., Historical Med      gabapentin (NEURONTIN) 300 mg capsule Take 300 mg by mouth Daily (before breakfast)., Historical Med      gabapentin (NEURONTIN) 600 mg tablet Take 600 mg by mouth nightly., Historical Med      LORazepam (ATIVAN) 1 mg tablet Take 1 mg by mouth daily as needed for Anxiety., Historical Med         STOP taking these medications       aspirin delayed-release 81 mg tablet  Comments:   Reason for Stopping:         multivitamin with minerals (HAIR,SKIN AND NAILS PO) Comments:   Reason for Stopping:         oxyCODONE-acetaminophen (PERCOCET) 7.5-325 mg per tablet Comments:   Reason for Stopping:         ibuprofen (ADVIL) 200 mg tablet Comments:   Reason for Stopping:         bumetanide (BUMEX PO) Comments:   Reason for Stopping:               Jolyn Nap, PA  02/18/2017  Orthopaedic Surgery  Physician Assistant to Dr. Norlene Duel

## 2017-02-19 LAB — METABOLIC PANEL, COMPREHENSIVE
A-G Ratio: 0.7 — ABNORMAL LOW (ref 1.1–2.2)
ALT (SGPT): 38 U/L (ref 12–78)
AST (SGOT): 39 U/L — ABNORMAL HIGH (ref 15–37)
Albumin: 2.3 g/dL — ABNORMAL LOW (ref 3.5–5.0)
Alk. phosphatase: 63 U/L (ref 45–117)
Anion gap: 6 mmol/L (ref 5–15)
BUN/Creatinine ratio: 15 (ref 12–20)
BUN: 11 MG/DL (ref 6–20)
Bilirubin, total: 0.3 MG/DL (ref 0.2–1.0)
CO2: 28 mmol/L (ref 21–32)
Calcium: 8.4 MG/DL — ABNORMAL LOW (ref 8.5–10.1)
Chloride: 106 mmol/L (ref 97–108)
Creatinine: 0.71 MG/DL (ref 0.55–1.02)
GFR est AA: >60 mL/min/{1.73_m2} (ref >60)
GFR est non-AA: >60 mL/min/{1.73_m2} (ref >60)
Globulin: 3.3 g/dL (ref 2.0–4.0)
Glucose: 94 mg/dL (ref 65–100)
Potassium: 3.9 mmol/L (ref 3.5–5.1)
Protein, total: 5.6 g/dL — ABNORMAL LOW (ref 6.4–8.2)
Sodium: 140 mmol/L (ref 136–145)

## 2017-02-19 LAB — CBC WITH AUTOMATED DIFF
ABS. BASOPHILS: 0 10*3/uL (ref 0.0–0.1)
ABS. EOSINOPHILS: 0.2 10*3/uL (ref 0.0–0.4)
ABS. IMM. GRANS.: 0 10*3/uL (ref 0.00–0.04)
ABS. LYMPHOCYTES: 1.9 10*3/uL (ref 0.8–3.5)
ABS. MONOCYTES: 0.6 10*3/uL (ref 0.0–1.0)
ABS. NEUTROPHILS: 4.3 10*3/uL (ref 1.8–8.0)
ABSOLUTE NRBC: 0 10*3/uL (ref 0.00–0.01)
BASOPHILS: 0 % (ref 0–1)
EOSINOPHILS: 3 % (ref 0–7)
HCT: 22.5 % — ABNORMAL LOW (ref 35.0–47.0)
HGB: 7.3 g/dL — ABNORMAL LOW (ref 11.5–16.0)
IMMATURE GRANULOCYTES: 0 % (ref 0.0–0.5)
LYMPHOCYTES: 27 % (ref 12–49)
MCH: 30.7 PG (ref 26.0–34.0)
MCHC: 32.4 g/dL (ref 30.0–36.5)
MCV: 94.5 FL (ref 80.0–99.0)
MONOCYTES: 8 % (ref 5–13)
MPV: 10.8 FL (ref 8.9–12.9)
NEUTROPHILS: 62 % (ref 32–75)
NRBC: 0 PER 100 WBC
PLATELET: 161 10*3/uL (ref 150–400)
RBC: 2.38 M/uL — ABNORMAL LOW (ref 3.80–5.20)
RDW: 14.2 % (ref 11.5–14.5)
WBC: 7 10*3/uL (ref 3.6–11.0)

## 2017-02-19 LAB — CULTURE, URINE
Colonies Counted: <1000
Colony Count: <1000
Culture result:: NO GROWTH
Culture: NO GROWTH

## 2017-02-19 LAB — VITAMIN D, 25 HYDROXY: Vitamin D 25-Hydroxy: 24 ng/mL — ABNORMAL LOW (ref 30–100)

## 2017-02-21 LAB — CBC W/O DIFF
ABSOLUTE NRBC: 0 10*3/uL (ref 0.00–0.01)
HCT: 25 % — ABNORMAL LOW (ref 35.0–47.0)
HGB: 8 g/dL — ABNORMAL LOW (ref 11.5–16.0)
MCH: 30.2 PG (ref 26.0–34.0)
MCHC: 32 g/dL (ref 30.0–36.5)
MCV: 94.3 FL (ref 80.0–99.0)
MPV: 10.6 FL (ref 8.9–12.9)
NRBC: 0 PER 100 WBC
PLATELET: 221 10*3/uL (ref 150–400)
RBC: 2.65 M/uL — ABNORMAL LOW (ref 3.80–5.20)
RDW: 13.9 % (ref 11.5–14.5)
WBC: 6 10*3/uL (ref 3.6–11.0)

## 2017-02-21 LAB — METABOLIC PANEL, BASIC
Anion gap: 6 mmol/L (ref 5–15)
BUN/Creatinine ratio: 14 (ref 12–20)
BUN: 10 MG/DL (ref 6–20)
CO2: 29 mmol/L (ref 21–32)
Calcium: 8.6 MG/DL (ref 8.5–10.1)
Chloride: 101 mmol/L (ref 97–108)
Creatinine: 0.71 MG/DL (ref 0.55–1.02)
GFR est AA: >60 mL/min/{1.73_m2} (ref >60)
GFR est non-AA: >60 mL/min/{1.73_m2} (ref >60)
Glucose: 96 mg/dL (ref 65–100)
Potassium: 4.4 mmol/L (ref 3.5–5.1)
Sodium: 136 mmol/L (ref 136–145)

## 2017-02-24 LAB — METABOLIC PANEL, BASIC
Anion gap: 8 mmol/L (ref 5–15)
BUN/Creatinine ratio: 26 — ABNORMAL HIGH (ref 12–20)
BUN: 18 MG/DL (ref 6–20)
CO2: 29 mmol/L (ref 21–32)
Calcium: 8.6 MG/DL (ref 8.5–10.1)
Chloride: 103 mmol/L (ref 97–108)
Creatinine: 0.68 MG/DL (ref 0.55–1.02)
GFR est AA: >60 mL/min/{1.73_m2} (ref >60)
GFR est non-AA: >60 mL/min/{1.73_m2} (ref >60)
Glucose: 102 mg/dL — ABNORMAL HIGH (ref 65–100)
Potassium: 4.1 mmol/L (ref 3.5–5.1)
Sodium: 140 mmol/L (ref 136–145)

## 2017-02-24 LAB — CBC W/O DIFF
ABSOLUTE NRBC: 0 10*3/uL (ref 0.00–0.01)
HCT: 22.9 % — ABNORMAL LOW (ref 35.0–47.0)
HGB: 7.5 g/dL — ABNORMAL LOW (ref 11.5–16.0)
MCH: 30.4 PG (ref 26.0–34.0)
MCHC: 32.8 g/dL (ref 30.0–36.5)
MCV: 92.7 FL (ref 80.0–99.0)
MPV: 9.5 FL (ref 8.9–12.9)
NRBC: 0 PER 100 WBC
PLATELET: 291 10*3/uL (ref 150–400)
RBC: 2.47 M/uL — ABNORMAL LOW (ref 3.80–5.20)
RDW: 13.5 % (ref 11.5–14.5)
WBC: 7.7 10*3/uL (ref 3.6–11.0)

## 2017-02-25 ENCOUNTER — Encounter: Admit: 2017-02-25 | Primary: Internal Medicine

## 2017-02-25 ENCOUNTER — Encounter

## 2017-02-28 LAB — METABOLIC PANEL, BASIC
Anion gap: 5 mmol/L (ref 5–15)
BUN/Creatinine ratio: 23 — ABNORMAL HIGH (ref 12–20)
BUN: 20 MG/DL (ref 6–20)
CO2: 32 mmol/L (ref 21–32)
Calcium: 8.9 MG/DL (ref 8.5–10.1)
Chloride: 101 mmol/L (ref 97–108)
Creatinine: 0.86 MG/DL (ref 0.55–1.02)
GFR est AA: >60 mL/min/{1.73_m2} (ref >60)
GFR est non-AA: >60 mL/min/{1.73_m2} (ref >60)
Glucose: 90 mg/dL (ref 65–100)
Potassium: 4 mmol/L (ref 3.5–5.1)
Sodium: 138 mmol/L (ref 136–145)

## 2017-02-28 LAB — CBC W/O DIFF
ABSOLUTE NRBC: 0 10*3/uL (ref 0.00–0.01)
HCT: 27.1 % — ABNORMAL LOW (ref 35.0–47.0)
HGB: 8.7 g/dL — ABNORMAL LOW (ref 11.5–16.0)
MCH: 29.8 PG (ref 26.0–34.0)
MCHC: 32.1 g/dL (ref 30.0–36.5)
MCV: 92.8 FL (ref 80.0–99.0)
MPV: 9.6 FL (ref 8.9–12.9)
NRBC: 0 PER 100 WBC
PLATELET: 313 10*3/uL (ref 150–400)
RBC: 2.92 M/uL — ABNORMAL LOW (ref 3.80–5.20)
RDW: 13.9 % (ref 11.5–14.5)
WBC: 7.6 10*3/uL (ref 3.6–11.0)

## 2017-03-03 LAB — METABOLIC PANEL, BASIC
Anion gap: 7 mmol/L (ref 5–15)
BUN/Creatinine ratio: 19 (ref 12–20)
BUN: 15 MG/DL (ref 6–20)
CO2: 30 mmol/L (ref 21–32)
Calcium: 8.8 MG/DL (ref 8.5–10.1)
Chloride: 105 mmol/L (ref 97–108)
Creatinine: 0.78 MG/DL (ref 0.55–1.02)
GFR est AA: >60 mL/min/{1.73_m2} (ref >60)
GFR est non-AA: >60 mL/min/{1.73_m2} (ref >60)
Glucose: 92 mg/dL (ref 65–100)
Potassium: 4.1 mmol/L (ref 3.5–5.1)
Sodium: 142 mmol/L (ref 136–145)

## 2017-03-03 LAB — CBC W/O DIFF
ABSOLUTE NRBC: 0 10*3/uL (ref 0.00–0.01)
HCT: 26.3 % — ABNORMAL LOW (ref 35.0–47.0)
HGB: 8.4 g/dL — ABNORMAL LOW (ref 11.5–16.0)
MCH: 30 PG (ref 26.0–34.0)
MCHC: 31.9 g/dL (ref 30.0–36.5)
MCV: 93.9 FL (ref 80.0–99.0)
MPV: 9.7 FL (ref 8.9–12.9)
NRBC: 0 PER 100 WBC
PLATELET: 300 10*3/uL (ref 150–400)
RBC: 2.8 M/uL — ABNORMAL LOW (ref 3.80–5.20)
RDW: 13.7 % (ref 11.5–14.5)
WBC: 6.4 10*3/uL (ref 3.6–11.0)

## 2017-03-03 NOTE — Progress Notes (Signed)
Left LE venous duplex completed. Prelim results called in to nurse Genia Harold at 1020. Final results to follow.

## 2017-03-04 NOTE — Procedures (Signed)
[facility]  *** FINAL REPORT ***    Name: Natasha White, Natasha White  MRN: [id number]    Outpatient  DOB: 02 Dec 1940  HIS Order #: [id number]  TRAKnet Visit #: [id number]  Date: 03 Mar 2017    TYPE OF TEST: Peripheral Venous Testing    REASON FOR TEST  Pain in limb    Left Leg:-  Deep venous thrombosis:           Yes  Proximal extent of thrombus:      Gastrocnemius  Superficial venous thrombosis:    Yes  Deep venous insufficiency:        No  Superficial venous insufficiency: No      INTERPRETATION/FINDINGS  PROCEDURE:  LEFT LOWER EXTREMITY VENOUS DUPLEX . Evaluation of lower  extremity veins with ultrasound (B-mode imaging, pulsed Doppler, color   Doppler).  Includes the common femoral, deep femoral, femoral,  popliteal, posterior tibial, peroneal, and great saphenous veins.  Other veins, for example the gastrocnemius and soleal veins, may also  be visualized.    FINDINGS: Unable to compress the left gastrocnemius veins along with  absence of color , cosnsistent with acute deep vein thrombosis. Also  unable to compress the left short saphenous vein from the popliteal  fossa down to ankle, consistent with acute superfical  thrombophlebitis. Unable to vidualize the paired peroneal veins due to   patient body habitus. Gray scale and color flow duplex images of the  remaining veins in the left lower extremity demonstrate normal  compressibility, spontaneous and augmented flow profiles, and absence  of filling defects throughout the deep and superficial veins in the  left lower extremity.    CONCLUSION:  Left lower extremity venous duplex positive for  both  deep venous thrombosis and thrombophlebitis. Right common femoral vein   is thrombus free.    ADDITIONAL COMMENTS    I have personally reviewed the data relevant to the interpretation of  this  study.    TECHNOLOGIST: Jasmine Smith, RVT  Signed: 03/03/2017 10:29 AM    PHYSICIAN: Sederick Jacobsen T. Cormick Moss, MD  Signed: 03/04/2017 08:17 AM

## 2017-03-04 NOTE — Procedures (Signed)
[facility]  *** FINAL REPORT ***    Name: Natasha White, Natasha White  MRN: [id number]    Outpatient  DOB: 1940-12-18  HIS Order #: [id number]  TRAKnet Visit #: [id number]  Date: 03 Mar 2017    TYPE OF TEST: Peripheral Venous Testing    REASON FOR TEST  Pain in limb    Left Leg:-  Deep venous thrombosis:           Yes  Proximal extent of thrombus:      Gastrocnemius  Superficial venous thrombosis:    Yes  Deep venous insufficiency:        No  Superficial venous insufficiency: No      INTERPRETATION/FINDINGS  PROCEDURE:  LEFT LOWER EXTREMITY VENOUS DUPLEX . Evaluation of lower  extremity veins with ultrasound (B-mode imaging, pulsed Doppler, color   Doppler).  Includes the common femoral, deep femoral, femoral,  popliteal, posterior tibial, peroneal, and great saphenous veins.  Other veins, for example the gastrocnemius and soleal veins, may also  be visualized.    FINDINGS: Unable to compress the left gastrocnemius veins along with  absence of color , cosnsistent with acute deep vein thrombosis. Also  unable to compress the left short saphenous vein from the popliteal  fossa down to ankle, consistent with acute superfical  thrombophlebitis. Unable to vidualize the paired peroneal veins due to   patient body habitus. Gray scale and color flow duplex images of the  remaining veins in the left lower extremity demonstrate normal  compressibility, spontaneous and augmented flow profiles, and absence  of filling defects throughout the deep and superficial veins in the  left lower extremity.    CONCLUSION:  Left lower extremity venous duplex positive for  both  deep venous thrombosis and thrombophlebitis. Right common femoral vein   is thrombus free.    ADDITIONAL COMMENTS    I have personally reviewed the data relevant to the interpretation of  this  study.    TECHNOLOGIST: Zacarias Pontes, RVT  Signed: 03/03/2017 10:29 AM    PHYSICIAN: Vernia Buff T. Sheliah Hatch, MD  Signed: 03/04/2017 08:17 AM

## 2017-03-07 LAB — CBC W/O DIFF
ABSOLUTE NRBC: 0 10*3/uL (ref 0.00–0.01)
HCT: 27.7 % — ABNORMAL LOW (ref 35.0–47.0)
HGB: 8.6 g/dL — ABNORMAL LOW (ref 11.5–16.0)
MCH: 29.4 PG (ref 26.0–34.0)
MCHC: 31 g/dL (ref 30.0–36.5)
MCV: 94.5 FL (ref 80.0–99.0)
MPV: 9.7 FL (ref 8.9–12.9)
NRBC: 0 PER 100 WBC
PLATELET: 274 10*3/uL (ref 150–400)
RBC: 2.93 M/uL — ABNORMAL LOW (ref 3.80–5.20)
RDW: 13.9 % (ref 11.5–14.5)
WBC: 5.1 10*3/uL (ref 3.6–11.0)

## 2017-03-07 LAB — METABOLIC PANEL, BASIC
Anion gap: 3 mmol/L — ABNORMAL LOW (ref 5–15)
BUN/Creatinine ratio: 16 (ref 12–20)
BUN: 13 MG/DL (ref 6–20)
CO2: 34 mmol/L — ABNORMAL HIGH (ref 21–32)
Calcium: 8.8 MG/DL (ref 8.5–10.1)
Chloride: 104 mmol/L (ref 97–108)
Creatinine: 0.8 MG/DL (ref 0.55–1.02)
GFR est AA: >60 mL/min/{1.73_m2} (ref >60)
GFR est non-AA: >60 mL/min/{1.73_m2} (ref >60)
Glucose: 82 mg/dL (ref 65–100)
Potassium: 4.2 mmol/L (ref 3.5–5.1)
Sodium: 141 mmol/L (ref 136–145)

## 2017-04-10 DIAGNOSIS — K219 Gastro-esophageal reflux disease without esophagitis: Secondary | ICD-10-CM | POA: Insufficient documentation

## 2017-05-26 ENCOUNTER — Inpatient Hospital Stay

## 2017-05-27 LAB — CBC W/O DIFF
ABSOLUTE NRBC: 0 10*3/uL (ref 0.00–0.01)
HCT: 22.8 % — ABNORMAL LOW (ref 35.0–47.0)
HGB: 7.2 g/dL — ABNORMAL LOW (ref 11.5–16.0)
MCH: 28.7 PG (ref 26.0–34.0)
MCHC: 31.6 g/dL (ref 30.0–36.5)
MCV: 90.8 FL (ref 80.0–99.0)
MPV: 10.1 FL (ref 8.9–12.9)
NRBC: 0 PER 100 WBC
PLATELET: 248 10*3/uL (ref 150–400)
RBC: 2.51 M/uL — ABNORMAL LOW (ref 3.80–5.20)
RDW: 16 % — ABNORMAL HIGH (ref 11.5–14.5)
WBC: 6.6 10*3/uL (ref 3.6–11.0)

## 2017-05-27 LAB — MAGNESIUM: Magnesium: 1.7 mg/dL (ref 1.6–2.4)

## 2017-05-27 LAB — METABOLIC PANEL, COMPREHENSIVE
A-G Ratio: 0.5 — ABNORMAL LOW (ref 1.1–2.2)
ALT (SGPT): 40 U/L (ref 12–78)
AST (SGOT): 38 U/L — ABNORMAL HIGH (ref 15–37)
Albumin: 1.7 g/dL — ABNORMAL LOW (ref 3.5–5.0)
Alk. phosphatase: 84 U/L (ref 45–117)
Anion gap: 10 mmol/L (ref 5–15)
BUN/Creatinine ratio: 16 (ref 12–20)
BUN: 11 MG/DL (ref 6–20)
Bilirubin, total: 0.4 MG/DL (ref 0.2–1.0)
CO2: 26 mmol/L (ref 21–32)
Calcium: 8.1 MG/DL — ABNORMAL LOW (ref 8.5–10.1)
Chloride: 108 mmol/L (ref 97–108)
Creatinine: 0.69 MG/DL (ref 0.55–1.02)
GFR est AA: >60 mL/min/{1.73_m2} (ref >60)
GFR est non-AA: >60 mL/min/{1.73_m2} (ref >60)
Globulin: 3.3 g/dL (ref 2.0–4.0)
Glucose: 103 mg/dL — ABNORMAL HIGH (ref 65–100)
Potassium: 3.6 mmol/L (ref 3.5–5.1)
Protein, total: 5 g/dL — ABNORMAL LOW (ref 6.4–8.2)
Sodium: 144 mmol/L (ref 136–145)

## 2017-05-27 LAB — VITAMIN D, 25 HYDROXY: Vitamin D 25-Hydroxy: 16.2 ng/mL — ABNORMAL LOW (ref 30–100)

## 2017-05-28 LAB — HGB & HCT
HCT: 24.8 % — ABNORMAL LOW (ref 35.0–47.0)
HGB: 7.9 g/dL — ABNORMAL LOW (ref 11.5–16.0)

## 2017-05-29 LAB — HGB & HCT
HCT: 24.2 % — ABNORMAL LOW (ref 35.0–47.0)
HGB: 7.6 g/dL — ABNORMAL LOW (ref 11.5–16.0)

## 2017-05-30 LAB — CBC W/O DIFF
ABSOLUTE NRBC: 0 10*3/uL (ref 0.00–0.01)
HCT: 26.5 % — ABNORMAL LOW (ref 35.0–47.0)
HGB: 8 g/dL — ABNORMAL LOW (ref 11.5–16.0)
MCH: 28.3 PG (ref 26.0–34.0)
MCHC: 30.2 g/dL (ref 30.0–36.5)
MCV: 93.6 FL (ref 80.0–99.0)
MPV: 10.5 FL (ref 8.9–12.9)
NRBC: 0 PER 100 WBC
PLATELET: 383 10*3/uL (ref 150–400)
RBC: 2.83 M/uL — ABNORMAL LOW (ref 3.80–5.20)
RDW: 15.9 % — ABNORMAL HIGH (ref 11.5–14.5)
WBC: 9.4 10*3/uL (ref 3.6–11.0)

## 2017-05-30 LAB — METABOLIC PANEL, BASIC
Anion gap: 10 mmol/L (ref 5–15)
BUN/Creatinine ratio: 13 (ref 12–20)
BUN: 10 MG/DL (ref 6–20)
CO2: 25 mmol/L (ref 21–32)
Calcium: 9 MG/DL (ref 8.5–10.1)
Chloride: 105 mmol/L (ref 97–108)
Creatinine: 0.8 MG/DL (ref 0.55–1.02)
GFR est AA: >60 mL/min/{1.73_m2} (ref >60)
GFR est non-AA: >60 mL/min/{1.73_m2} (ref >60)
Glucose: 100 mg/dL (ref 65–100)
Potassium: 4.8 mmol/L (ref 3.5–5.1)
Sodium: 140 mmol/L (ref 136–145)

## 2017-05-31 LAB — HGB & HCT
HCT: 26.2 % — ABNORMAL LOW (ref 35.0–47.0)
HGB: 8.1 g/dL — ABNORMAL LOW (ref 11.5–16.0)

## 2017-05-31 LAB — URINALYSIS W/ RFLX MICROSCOPIC
Bilirubin: NEGATIVE
Blood: NEGATIVE
Glucose: NEGATIVE mg/dL
Ketone: NEGATIVE mg/dL
Leukocyte Esterase: NEGATIVE
Nitrites: NEGATIVE
Protein: NEGATIVE mg/dL
Specific gravity: 1.012 (ref 1.003–1.030)
Urobilinogen: 1 EU/dL (ref 0.2–1.0)
pH (UA): 6.5 (ref 5.0–8.0)

## 2017-06-01 LAB — HGB & HCT
HCT: 23.7 % — ABNORMAL LOW (ref 35.0–47.0)
HGB: 7.3 g/dL — ABNORMAL LOW (ref 11.5–16.0)

## 2017-06-02 ENCOUNTER — Encounter: Admit: 2017-06-02 | Primary: Internal Medicine

## 2017-06-02 LAB — METABOLIC PANEL, BASIC
Anion gap: 6 mmol/L (ref 5–15)
BUN/Creatinine ratio: 18 (ref 12–20)
BUN: 14 MG/DL (ref 6–20)
CO2: 28 mmol/L (ref 21–32)
Calcium: 8.6 MG/DL (ref 8.5–10.1)
Chloride: 106 mmol/L (ref 97–108)
Creatinine: 0.76 MG/DL (ref 0.55–1.02)
GFR est AA: >60 mL/min/{1.73_m2} (ref >60)
GFR est non-AA: >60 mL/min/{1.73_m2} (ref >60)
Glucose: 91 mg/dL (ref 65–100)
Potassium: 4.9 mmol/L (ref 3.5–5.1)
Sodium: 140 mmol/L (ref 136–145)

## 2017-06-02 LAB — CBC W/O DIFF
ABSOLUTE NRBC: 0 10*3/uL (ref 0.00–0.01)
HCT: 25.1 % — ABNORMAL LOW (ref 35.0–47.0)
HGB: 7.7 g/dL — ABNORMAL LOW (ref 11.5–16.0)
MCH: 28.8 PG (ref 26.0–34.0)
MCHC: 30.7 g/dL (ref 30.0–36.5)
MCV: 94 FL (ref 80.0–99.0)
MPV: 9.6 FL (ref 8.9–12.9)
NRBC: 0 PER 100 WBC
PLATELET: 427 10*3/uL — ABNORMAL HIGH (ref 150–400)
RBC: 2.67 M/uL — ABNORMAL LOW (ref 3.80–5.20)
RDW: 16 % — ABNORMAL HIGH (ref 11.5–14.5)
WBC: 7.1 10*3/uL (ref 3.6–11.0)

## 2017-06-02 LAB — CULTURE, URINE
Colonies Counted: >100000
Colony Count: >100000

## 2017-06-03 LAB — HGB & HCT
HCT: 25.6 % — ABNORMAL LOW (ref 35.0–47.0)
HGB: 7.8 g/dL — ABNORMAL LOW (ref 11.5–16.0)

## 2017-06-04 LAB — HGB & HCT
HCT: 25.2 % — ABNORMAL LOW (ref 35.0–47.0)
HGB: 7.7 g/dL — ABNORMAL LOW (ref 11.5–16.0)

## 2017-06-05 LAB — HGB & HCT
HCT: 28.6 % — ABNORMAL LOW (ref 35.0–47.0)
HGB: 8.7 g/dL — ABNORMAL LOW (ref 11.5–16.0)

## 2017-06-06 LAB — METABOLIC PANEL, BASIC
Anion gap: 7 mmol/L (ref 5–15)
BUN/Creatinine ratio: 17 (ref 12–20)
BUN: 13 MG/DL (ref 6–20)
CO2: 28 mmol/L (ref 21–32)
Calcium: 8.6 MG/DL (ref 8.5–10.1)
Chloride: 105 mmol/L (ref 97–108)
Creatinine: 0.77 MG/DL (ref 0.55–1.02)
GFR est AA: >60 mL/min/{1.73_m2} (ref >60)
GFR est non-AA: >60 mL/min/{1.73_m2} (ref >60)
Glucose: 100 mg/dL (ref 65–100)
Potassium: 4.7 mmol/L (ref 3.5–5.1)
Sodium: 140 mmol/L (ref 136–145)

## 2017-06-06 LAB — CBC W/O DIFF
ABSOLUTE NRBC: 0 10*3/uL (ref 0.00–0.01)
HCT: 27.4 % — ABNORMAL LOW (ref 35.0–47.0)
HGB: 8.2 g/dL — ABNORMAL LOW (ref 11.5–16.0)
MCH: 28.5 PG (ref 26.0–34.0)
MCHC: 29.9 g/dL — ABNORMAL LOW (ref 30.0–36.5)
MCV: 95.1 FL (ref 80.0–99.0)
MPV: 9.7 FL (ref 8.9–12.9)
NRBC: 0 PER 100 WBC
PLATELET: 423 10*3/uL — ABNORMAL HIGH (ref 150–400)
RBC: 2.88 M/uL — ABNORMAL LOW (ref 3.80–5.20)
RDW: 16 % — ABNORMAL HIGH (ref 11.5–14.5)
WBC: 6.1 10*3/uL (ref 3.6–11.0)

## 2017-06-07 LAB — HGB & HCT
HCT: 26.9 % — ABNORMAL LOW (ref 35.0–47.0)
HGB: 8.2 g/dL — ABNORMAL LOW (ref 11.5–16.0)

## 2017-06-08 LAB — HGB & HCT
HCT: 28.4 % — ABNORMAL LOW (ref 35.0–47.0)
HGB: 8.9 g/dL — ABNORMAL LOW (ref 11.5–16.0)

## 2017-06-09 LAB — CBC W/O DIFF
ABSOLUTE NRBC: 0 10*3/uL (ref 0.00–0.01)
HCT: 29.1 % — ABNORMAL LOW (ref 35.0–47.0)
HGB: 8.9 g/dL — ABNORMAL LOW (ref 11.5–16.0)
MCH: 28.8 PG (ref 26.0–34.0)
MCHC: 30.6 g/dL (ref 30.0–36.5)
MCV: 94.2 FL (ref 80.0–99.0)
MPV: 9.4 FL (ref 8.9–12.9)
NRBC: 0 PER 100 WBC
PLATELET: 396 10*3/uL (ref 150–400)
RBC: 3.09 M/uL — ABNORMAL LOW (ref 3.80–5.20)
RDW: 16.6 % — ABNORMAL HIGH (ref 11.5–14.5)
WBC: 5 10*3/uL (ref 3.6–11.0)

## 2017-06-09 LAB — METABOLIC PANEL, BASIC
Anion gap: 8 mmol/L (ref 5–15)
BUN/Creatinine ratio: 22 — ABNORMAL HIGH (ref 12–20)
BUN: 17 MG/DL (ref 6–20)
CO2: 25 mmol/L (ref 21–32)
Calcium: 8.7 MG/DL (ref 8.5–10.1)
Chloride: 107 mmol/L (ref 97–108)
Creatinine: 0.77 MG/DL (ref 0.55–1.02)
GFR est AA: >60 mL/min/{1.73_m2} (ref >60)
GFR est non-AA: >60 mL/min/{1.73_m2} (ref >60)
Glucose: 98 mg/dL (ref 65–100)
Potassium: 4.7 mmol/L (ref 3.5–5.1)
Sodium: 140 mmol/L (ref 136–145)

## 2017-06-10 LAB — HGB & HCT
HCT: 28.2 % — ABNORMAL LOW (ref 35.0–47.0)
HGB: 8.7 g/dL — ABNORMAL LOW (ref 11.5–16.0)

## 2017-06-11 LAB — CBC W/O DIFF
ABSOLUTE NRBC: 0 10*3/uL (ref 0.00–0.01)
HCT: 29.9 % — ABNORMAL LOW (ref 35.0–47.0)
HGB: 8.9 g/dL — ABNORMAL LOW (ref 11.5–16.0)
MCH: 28.3 PG (ref 26.0–34.0)
MCHC: 29.8 g/dL — ABNORMAL LOW (ref 30.0–36.5)
MCV: 95.2 FL (ref 80.0–99.0)
MPV: 9.7 FL (ref 8.9–12.9)
NRBC: 0 PER 100 WBC
PLATELET: 334 10*3/uL (ref 150–400)
RBC: 3.14 M/uL — ABNORMAL LOW (ref 3.80–5.20)
RDW: 16.8 % — ABNORMAL HIGH (ref 11.5–14.5)
WBC: 4.5 10*3/uL (ref 3.6–11.0)

## 2017-06-12 LAB — HGB & HCT
HCT: 30.9 % — ABNORMAL LOW (ref 35.0–47.0)
HGB: 9.5 g/dL — ABNORMAL LOW (ref 11.5–16.0)

## 2017-06-12 LAB — SAMPLES BEING HELD

## 2017-06-13 LAB — CBC W/O DIFF
ABSOLUTE NRBC: 0 10*3/uL (ref 0.00–0.01)
HCT: 31 % — ABNORMAL LOW (ref 35.0–47.0)
HGB: 9.5 g/dL — ABNORMAL LOW (ref 11.5–16.0)
MCH: 29.2 PG (ref 26.0–34.0)
MCHC: 30.6 g/dL (ref 30.0–36.5)
MCV: 95.4 FL (ref 80.0–99.0)
MPV: 10 FL (ref 8.9–12.9)
NRBC: 0 PER 100 WBC
PLATELET: 293 10*3/uL (ref 150–400)
RBC: 3.25 M/uL — ABNORMAL LOW (ref 3.80–5.20)
RDW: 16.9 % — ABNORMAL HIGH (ref 11.5–14.5)
WBC: 4.6 10*3/uL (ref 3.6–11.0)

## 2017-06-13 LAB — METABOLIC PANEL, BASIC
Anion gap: 6 mmol/L (ref 5–15)
BUN/Creatinine ratio: 24 — ABNORMAL HIGH (ref 12–20)
BUN: 19 MG/DL (ref 6–20)
CO2: 29 mmol/L (ref 21–32)
Calcium: 8.7 MG/DL (ref 8.5–10.1)
Chloride: 107 mmol/L (ref 97–108)
Creatinine: 0.8 MG/DL (ref 0.55–1.02)
GFR est AA: >60 mL/min/{1.73_m2} (ref >60)
GFR est non-AA: >60 mL/min/{1.73_m2} (ref >60)
Glucose: 88 mg/dL (ref 65–100)
Potassium: 4.3 mmol/L (ref 3.5–5.1)
Sodium: 142 mmol/L (ref 136–145)

## 2017-06-14 LAB — HGB & HCT
HCT: 30.3 % — ABNORMAL LOW (ref 35.0–47.0)
HGB: 9.3 g/dL — ABNORMAL LOW (ref 11.5–16.0)

## 2017-06-15 LAB — HGB & HCT
HCT: 30.5 % — ABNORMAL LOW (ref 35.0–47.0)
HGB: 9.3 g/dL — ABNORMAL LOW (ref 11.5–16.0)

## 2019-05-09 LAB — HM MAMMOGRAPHY

## 2021-02-13 ENCOUNTER — Ambulatory Visit: Payer: Self-pay | Admitting: Cardiology

## 2021-03-20 ENCOUNTER — Other Ambulatory Visit: Payer: Self-pay

## 2021-03-20 ENCOUNTER — Ambulatory Visit (INDEPENDENT_AMBULATORY_CARE_PROVIDER_SITE_OTHER): Payer: Medicare Other | Admitting: Cardiovascular Disease

## 2021-03-20 ENCOUNTER — Encounter: Payer: Self-pay | Admitting: Cardiovascular Disease

## 2021-03-20 VITALS — BP 110/62 | HR 58 | Ht 64.0 in | Wt 187.0 lb

## 2021-03-20 DIAGNOSIS — Z01810 Encounter for preprocedural cardiovascular examination: Secondary | ICD-10-CM

## 2021-03-20 DIAGNOSIS — I10 Essential (primary) hypertension: Secondary | ICD-10-CM | POA: Diagnosis not present

## 2021-03-20 DIAGNOSIS — I890 Lymphedema, not elsewhere classified: Secondary | ICD-10-CM

## 2021-03-20 DIAGNOSIS — I499 Cardiac arrhythmia, unspecified: Secondary | ICD-10-CM | POA: Diagnosis not present

## 2021-03-20 DIAGNOSIS — I479 Paroxysmal tachycardia, unspecified: Secondary | ICD-10-CM

## 2021-03-20 MED ORDER — METOPROLOL SUCCINATE ER 25 MG PO TB24: 25.0000 mg | ORAL_TABLET | Freq: Every day | ORAL | 3 refills | Status: DC

## 2021-03-20 NOTE — Patient Instructions (Addendum)
Can try calling [facility], they can help you find and establish a new PCP Call [phone]  OR  You may call around to different offices and ask if they are accepting new patients. Try calling: [facility] [phone] [employer] at [place] [phone] (closed at this time) [employer] at [place] [facility] [phone] [place] [phone]  Consult to Urbano Heir,  occupational therapist with advanced certification in lymphedema treatment  Phone number: [phone] Use your lymph pumps daily  Medication Instructions:  No changes  If you need a refill on your cardiac medications before your next appointment, please call your pharmacy.   Lab work: No new labs needed  Testing/Procedures: No new testing needed  Follow-Up: At [facility], you and your health needs are our priority.  As part of our continuing mission to provide you with exceptional heart care, we have created designated Provider Care Teams.  These Care Teams include your primary Cardiologist (physician) and Advanced Practice Providers (APPs -  Physician Assistants and Nurse Practitioners) who all work together to provide you with the care you need, when you need it.  You will need a follow up appointment in 12 months  Providers on your designated Care Team:   Murray Hodgkins, NP Christell Faith, PA-C Marrianne Mood, PA-C Cadence [place], [place]  COVID-19 Vaccine Information can be found at: [email or web address].uk For questions related to vaccine distribution or appointments, please email [email or web address] or call [phone].

## 2021-03-20 NOTE — Progress Notes (Signed)
Cardiology Office Note  Date:  03/20/2021   ID:  Mary [name], DOB 09-03-1940, MRN [id number]  PCP:  Pcp, No   Chief Complaint  Patient presents with   New Patient (Initial Visit)    Self ref to establish care for SVT; former patient of Letitia Neri, MD in [place]. Medications reviewed by the patient verbally. Patient c/o SVT at times.     HPI:  Ms. Kaydee Magel is a 80 year old woman with past medical history of Lymphedema, has pumps Paroxysmal tachycardia of unclear etiology, on beta-blocker (possible SVT but nothing documented in the notes from cardiology in [place] that we have received) Hypertension Hyperlipidemia Diastolic CHF on Lasix Who presents for new patient evaluation of her leg swelling, tachycardia  Reports having very symptomatic tachycardia in the past, unclear etiology Wore a monitor, did not have symptoms while monitor was in place Was told monitor showed 4 beat run VT and was started on metoprolol succinate 50 daily Secondary to low blood pressure metoprolol decreased from 50 down to 25 On this dose denies any orthostatic symptoms  No regular exercise program Has lymphedema, worse on the left than the right Prior bilateral knee replacements Does not use her lymphedema compression pumps as she should  Uses a cane/walker to get around  Lab work reviewed a1C 5.9 Has had additional lab work with primary care, this has been requested  Weight coming down,  Used to be 240 pounds, now 187 through dietary changes  EKG personally reviewed by myself on todays visit Normal sinus rhythm rate 58 bpm unable to exclude old anterior MI   PMH:   has a past medical history of Arrhythmia, Back pain, Depression with anxiety, Hip pain, Hyperlipidemia, Hypertension, and Paroxysmal tachycardia ([place]).  PSH:    Past Surgical History:  Procedure Laterality Date   ABDOMINAL HYSTERECTOMY     CARDIAC CATHETERIZATION     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   TOTAL HIP  ARTHROPLASTY      Current Outpatient Medications  Medication Sig Dispense Refill   bumetanide (BUMEX) 1 MG tablet Take one tablet (1mg ) every other day in the AM as needed.     buPROPion (WELLBUTRIN SR) 150 MG 12 hr tablet Take 150 mg by mouth 2 (two) times daily.     Cholecalciferol (VITAMIN D3) 25 MCG (1000 UT) CAPS Take 1,000 Units by mouth daily.     gabapentin (NEURONTIN) 300 MG capsule Take 300 mg by mouth 3 (three) times daily.     LORazepam (ATIVAN) 1 MG tablet Take 1 mg by mouth 3 (three) times daily as needed for anxiety.     meloxicam (MOBIC) 7.5 MG tablet Mobic 7.5 mg tablet  Take 1 tablet every day by oral route.     pravastatin (PRAVACHOL) 40 MG tablet Take 40 mg by mouth daily.     sertraline (ZOLOFT) 50 MG tablet Take 50 mg by mouth daily.     traMADol (ULTRAM) 50 MG tablet tramadol 50 mg tablet     metoprolol succinate (TOPROL-XL) 25 MG 24 hr tablet Take 1 tablet (25 mg total) by mouth daily. 90 tablet 3   No current facility-administered medications for this visit.     Allergies:   Codeine and Simvastatin   Social History:  The patient  reports that she has quit smoking. Her smoking use included cigarettes. She has never used smokeless tobacco. She reports that she does not currently use alcohol. She reports that she does not use drugs.   Family History:  family history includes CAD in her mother; Hypertension in her mother and sister; Lung cancer in her father; Multiple sclerosis in her sister; Pancreatic cancer in her mother.    Review of Systems: Review of Systems  Constitutional: Negative.   HENT: Negative.    Respiratory: Negative.    Cardiovascular:  Positive for leg swelling.  Gastrointestinal: Negative.   Musculoskeletal: Negative.   Neurological: Negative.   Psychiatric/Behavioral: Negative.    All other systems reviewed and are negative.   PHYSICAL EXAM: VS:  BP 110/62 (BP Location: Right Arm, Patient Position: Sitting, Cuff Size: Normal)    Pulse (!) 58   Ht 5\' 4"  (1.626 m)   Wt 187 lb (84.8 kg)   SpO2 98%   BMI 32.10 kg/m  , BMI Body mass index is 32.1 kg/m. GEN: Well nourished, well developed, in no acute distress HEENT: normal Neck: no JVD, carotid bruits, or masses Cardiac: RRR; no murmurs, rubs, or gallops,no edema  Respiratory:  clear to auscultation bilaterally, normal work of breathing GI: soft, nontender, nondistended, + BS MS: no deformity or atrophy Skin: warm and dry, no rash Neuro:  Strength and sensation are intact Psych: euthymic mood, full affect  Recent Labs: No results found for requested labs within last 8760 hours.    Lipid Panel No results found for: CHOL, HDL, LDLCALC, TRIG    Wt Readings from Last 3 Encounters:  03/20/21 187 lb (84.8 kg)     ASSESSMENT AND PLAN:  Problem List Items Addressed This Visit   None Visit Diagnoses     Pre-procedural cardiovascular examination    -  Primary   Primary hypertension       Relevant Medications   metoprolol succinate (TOPROL-XL) 25 MG 24 hr tablet   Other Relevant Orders   EKG 12-Lead   Cardiac arrhythmia, unspecified cardiac arrhythmia type       Relevant Medications   metoprolol succinate (TOPROL-XL) 25 MG 24 hr tablet   Other Relevant Orders   EKG 12-Lead   Lymphedema       Relevant Orders   Ambulatory referral to Occupational Therapy      Paroxysmal tachycardia Etiology unclear, reports she did not have symptomatic tachycardia while monitor was in place through cardiologist in [place] Has had no further symptoms on metoprolol succinate 25 daily She does have apple watch, she will investigate whether this can monitor for cardiac arrhythmia Recommend extra metoprolol for any breakthrough tachypalpitations -Notes from cardiology indicating 4 beat run VT on monitor.  Reports she was asymptomatic  Essential hypertension With recent weight loss blood pressure running lower she reports Discussed ways to monitor for orthostasis She  will call us for any symptoms and she will monitor blood pressure at home  Lymphedema Interested in lymphedema OT/massage and learning more about her disease Referral made to OT Recommend she use her compression pumps on a daily basis, leg elevation, stay active  Hyperlipidemia She will obtain labs from primary care for our records  Preventive care Discussed need for regular exercise program Weight is trending downward through dietary changes   Total encounter time more than 60 minutes  Greater than 50% was spent in counseling and coordination of care with the patient    Signed, E[place]ond Plants, M.D., Ph.D. [place], [place]

## 2021-03-25 ENCOUNTER — Telehealth: Payer: Self-pay | Admitting: Cardiovascular Disease

## 2021-03-25 NOTE — Telephone Encounter (Signed)
Patient calling Concerned about notes on EKG and would like to clarify and discuss with nurse Please call

## 2021-03-25 NOTE — Telephone Encounter (Signed)
Patient returning call about ekg results.   She is concerned about terms brady and infarct   Please call.

## 2021-03-25 NOTE — Telephone Encounter (Signed)
Unable to reach via phone, [facility]  Per Dr. Donivan Scull notes from 10/21 EKG personally reviewed by myself on todays visit Normal sinus rhythm rate 58 bpm unable to exclude old anterior MI  EKG was reviewed with pt during office visit, no concerns or abnormity with EKG.

## 2021-03-25 NOTE — Telephone Encounter (Signed)
Was able to return call to pt, was able to answer questions to Mary [name]'s concern of EKG reading posted on her MyChart. Once discuss EKG readings and interpretations and Dr. Donivan Scull notes, pt feels better and relived of her EKG interpretation.   [name] very thankful for returning her call and reassuring her of her concerns. Will call back with anything further.

## 2021-06-02 ENCOUNTER — Encounter: Payer: Self-pay | Admitting: Internal Medicine

## 2021-06-02 ENCOUNTER — Other Ambulatory Visit: Payer: Self-pay

## 2021-06-02 ENCOUNTER — Ambulatory Visit (INDEPENDENT_AMBULATORY_CARE_PROVIDER_SITE_OTHER): Payer: Medicare Other | Admitting: Internal Medicine

## 2021-06-02 VITALS — BP 126/81 | HR 76 | Temp 98.7°F | Ht 64.17 in | Wt 189.0 lb

## 2021-06-02 DIAGNOSIS — I5031 Acute diastolic (congestive) heart failure: Secondary | ICD-10-CM | POA: Diagnosis not present

## 2021-06-02 DIAGNOSIS — R000 Tachycardia, unspecified: Secondary | ICD-10-CM

## 2021-06-02 DIAGNOSIS — Z1231 Encounter for screening mammogram for malignant neoplasm of breast: Secondary | ICD-10-CM | POA: Insufficient documentation

## 2021-06-02 DIAGNOSIS — I10 Essential (primary) hypertension: Secondary | ICD-10-CM

## 2021-06-02 DIAGNOSIS — J0110 Acute frontal sinusitis, unspecified: Secondary | ICD-10-CM | POA: Diagnosis not present

## 2021-06-02 DIAGNOSIS — R2681 Unsteadiness on feet: Secondary | ICD-10-CM

## 2021-06-02 MED ORDER — FEXOFENADINE HCL 180 MG PO TABS: 180.0000 mg | ORAL_TABLET | Freq: Every day | ORAL | 1 refills | Status: DC

## 2021-06-02 MED ORDER — AMOXICILLIN-POT CLAVULANATE 875-125 MG PO TABS: 1.0000 | ORAL_TABLET | Freq: Two times a day (BID) | ORAL | 0 refills | Status: AC

## 2021-06-02 NOTE — Patient Instructions (Signed)
Please call to schedule your mammogram and/or bone density: °[facility]  °Address: [address], [place], [place] [address]  °Phone: [phone] ° °

## 2021-06-02 NOTE — Progress Notes (Signed)
BP 126/81    Pulse 76    Temp 98.7 F (37.1 C) (Oral)    Ht 5' 4.17" (1.63 m)    Wt 189 lb (85.7 kg)    SpO2 97%    BMI 32.27 kg/m    Subjective:    Patient ID: Mary [name], female    DOB: April 26, 1941, 81 y.o.   MRN: [id number]  Chief Complaint  Patient presents with   New Patient (Initial Visit)    To est. Care, does have a cardiologist but no PCP.    Cough    Has had a cough since 05/26/21 along with sinus pain an pressure   Facial Pain    HPI: Mary [name] is a 81 y.o. female  Pt moved from [place] sec to her son being here.  Has had 8 different ortho surgeriesright hip ut in replacement 2 yrs later had another hip placed. Right knee was repalced as well.  Left knee was replaced and was sent to rehab x 2 weeks and fell there and had to get a repeat surgery. 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   Her husbnad is alcoholic and she has suffered from depression sec to such is on zoflt and buproprion for such   Is on bumex for ymphaedema and has a lymphopress is a wrap that helps with such.    Cough This is a new (last week took a COVID test that was -ve and symptoms started getting worse could feel it in her teeth and is a greenish yellow color now. its better but not gone) problem.  Heart Problem This is a chronic (has an arrythmia  since she was 81 yrs old is on toprol xl for such had seen cards in the past.) problem. Associated symptoms include coughing.  Depression        Chief Complaint  Patient presents with   New Patient (Initial Visit)    To est. Care, does have a cardiologist but no PCP.    Cough    Has had a cough since 05/26/21 along with sinus pain an pressure   Facial Pain    Relevant past medical, surgical, family and social history reviewed and updated as indicated. Interim medical history since our  last visit reviewed. Allergies and medications reviewed and updated.  Review of Systems  Respiratory:  Positive for cough.   Psychiatric/Behavioral:  Positive for depression.    Per HPI unless specifically indicated above     Objective:    BP 126/81    Pulse 76    Temp 98.7 F (37.1 C) (Oral)    Ht 5' 4.17" (1.63 m)    Wt 189 lb (85.7 kg)    SpO2 97%    BMI 32.27 kg/m   Wt Readings from Last 3 Encounters:  06/02/21 189 lb (85.7 kg)  03/20/21 187 lb (84.8 kg)    Physical Exam  No results found for this or any previous visit.      Current Outpatient Medications:    amoxicillin-clavulanate (AUGMENTIN) 875-125 MG tablet, Take 1 tablet by mouth 2 (two) times daily for 5 days., Disp: 10 tablet, Rfl: 0   bumetanide (BUMEX) 1 MG tablet, Take one tablet (1mg ) every other day in the AM as needed., Disp: , Rfl:    buPROPion (WELLBUTRIN SR) 150  MG 12 hr tablet, Take 150 mg by mouth 2 (two) times daily., Disp: , Rfl:    Cholecalciferol (VITAMIN D3) 25 MCG (1000 UT) CAPS, Take 1,000 Units by mouth daily., Disp: , Rfl:    fexofenadine (ALLEGRA ALLERGY) 180 MG tablet, Take 1 tablet (180 mg total) by mouth daily., Disp: 10 tablet, Rfl: 1   gabapentin (NEURONTIN) 300 MG capsule, Take 300 mg by mouth 3 (three) times daily., Disp: , Rfl:    LORazepam (ATIVAN) 1 MG tablet, Take 1 mg by mouth 3 (three) times daily as needed for anxiety., Disp: , Rfl:    meloxicam (MOBIC) 7.5 MG tablet, Mobic 7.5 mg tablet  Take 1 tablet every day by oral route., Disp: , Rfl:    metoprolol succinate (TOPROL-XL) 25 MG 24 hr tablet, Take 1 tablet (25 mg total) by mouth daily., Disp: 90 tablet, Rfl: 3   pravastatin (PRAVACHOL) 40 MG tablet, Take 40 mg by mouth daily., Disp: , Rfl:    sertraline (ZOLOFT) 50 MG tablet, Take 50 mg by mouth daily., Disp: , Rfl:    traMADol (ULTRAM) 50 MG tablet, tramadol 50 mg tablet, Disp: , Rfl:     Assessment & Plan:  Depression : Stable . Is on zoloft and welbutrin for such   Continue current meds  Mulitple orthopedic surgeries: as in HPO  Will refer to ortho and PT for gait instability   Cough : URI  URI: Flu and strep ordered at this visit, both negative. pt advised to take Tylenol q 4- 6 hourly as needed. pt to take allegra q pm as needed and to call office if symptoms worsened pt verbalised understanding of such.  Fibrocystic dz of the breast  Will check Mammogram   Tachycardia on metoprolol for scuh consider refrral to cards next visit continue current meds.   ? Etiology or diagnosis.  Will need to fu with them soon.obtain notes from past.   Problem List Items Addressed This Visit       Cardiovascular and Mediastinum   Acute diastolic heart failure ([place]) - Primary   Primary hypertension   Relevant Orders   CBC with Differential/Platelet   Comprehensive metabolic panel   Lipid panel   Urinalysis, Routine w reflex microscopic   TSH     Respiratory   Acute non-recurrent frontal sinusitis   Relevant Medications   amoxicillin-clavulanate (AUGMENTIN) 875-125 MG tablet   fexofenadine (ALLEGRA ALLERGY) 180 MG tablet     Other   Tachycardia   Relevant Orders   CBC with Differential/Platelet   Comprehensive metabolic panel   Lipid panel   Urinalysis, Routine w reflex microscopic   TSH   Encounter for screening mammogram for malignant neoplasm of breast   Relevant Orders   MM 3D SCREEN BREAST BILATERAL   Gait instability   Relevant Orders   Ambulatory referral to Orthopedics   Ambulatory referral to Physical Therapy     Orders Placed This Encounter  Procedures   MM 3D SCREEN BREAST BILATERAL   CBC with Differential/Platelet   Comprehensive metabolic panel   Lipid panel   Urinalysis, Routine w reflex microscopic   TSH   Ambulatory referral to Orthopedics   Ambulatory referral to Physical Therapy     Meds ordered this encounter  Medications   amoxicillin-clavulanate (AUGMENTIN) 875-125 MG tablet    Sig: Take 1 tablet by mouth 2  (two) times daily for 5 days.    Dispense:  10 tablet    Refill:  0  fexofenadine (ALLEGRA ALLERGY) 180 MG tablet    Sig: Take 1 tablet (180 mg total) by mouth daily.    Dispense:  10 tablet    Refill:  1     Follow up plan: No follow-ups on file.

## 2021-06-10 ENCOUNTER — Telehealth: Payer: Self-pay | Admitting: Internal Medicine

## 2021-06-10 ENCOUNTER — Ambulatory Visit
Admission: RE | Admit: 2021-06-10 | Discharge: 2021-06-10 | Disposition: A | Discharge status: Home / Self Care | Payer: Medicare Other | Source: Ambulatory Visit | Attending: Internal Medicine | Admitting: Internal Medicine

## 2021-06-10 ENCOUNTER — Other Ambulatory Visit: Payer: Self-pay

## 2021-06-10 DIAGNOSIS — Z1231 Encounter for screening mammogram for malignant neoplasm of breast: Secondary | ICD-10-CM | POA: Insufficient documentation

## 2021-06-10 NOTE — Telephone Encounter (Signed)
Copied from [place] [phone]. Topic: Medicare AWV >> Jun 10, 2021  3:09 PM Lavonia Drafts wrote: Reason for CRM:  N/A unable to leave a message for patient to call back and schedule Medicare Annual Wellness Visit (AWV) to be done virtually or by telephone.  No hx of AWV eligible as of 02/28/21  Please schedule at anytime with [facility]-Nurse Health Advisor.      17 Minutes appointment   Any questions, please call me at [phone]

## 2021-06-16 ENCOUNTER — Encounter: Payer: Self-pay | Admitting: Internal Medicine

## 2021-06-17 ENCOUNTER — Other Ambulatory Visit: Payer: Medicare Other

## 2021-06-17 ENCOUNTER — Other Ambulatory Visit: Payer: Self-pay

## 2021-06-17 DIAGNOSIS — I10 Essential (primary) hypertension: Secondary | ICD-10-CM

## 2021-06-17 DIAGNOSIS — R000 Tachycardia, unspecified: Secondary | ICD-10-CM

## 2021-06-17 LAB — URINALYSIS, ROUTINE W REFLEX MICROSCOPIC
Bilirubin, UA: NEGATIVE
Glucose, UA: NEGATIVE
Ketones, UA: NEGATIVE
Leukocytes,UA: NEGATIVE
Nitrite, UA: NEGATIVE
Protein,UA: NEGATIVE
RBC, UA: NEGATIVE
Specific Gravity, UA: 1.025 (ref 1.005–1.030)
Urobilinogen, Ur: 0.2 mg/dL (ref 0.2–1.0)
pH, UA: 6 (ref 5.0–7.5)

## 2021-06-18 ENCOUNTER — Other Ambulatory Visit: Payer: Self-pay

## 2021-06-18 ENCOUNTER — Other Ambulatory Visit: Payer: Self-pay | Admitting: Internal Medicine

## 2021-06-18 LAB — COMPREHENSIVE METABOLIC PANEL
ALT: 13 IU/L (ref 0–32)
AST: 16 IU/L (ref 0–40)
Albumin/Globulin Ratio: 2 (ref 1.2–2.2)
Albumin: 4.4 g/dL (ref 3.7–4.7)
Alkaline Phosphatase: 111 IU/L (ref 44–121)
BUN/Creatinine Ratio: 23 (ref 12–28)
BUN: 23 mg/dL (ref 8–27)
Bilirubin Total: 0.6 mg/dL (ref 0.0–1.2)
CO2: 25 mmol/L (ref 20–29)
Calcium: 9.6 mg/dL (ref 8.7–10.3)
Chloride: 105 mmol/L (ref 96–106)
Creatinine, Ser: 1.01 mg/dL — ABNORMAL HIGH (ref 0.57–1.00)
Globulin, Total: 2.2 g/dL (ref 1.5–4.5)
Glucose: 93 mg/dL (ref 70–99)
Potassium: 5 mmol/L (ref 3.5–5.2)
Sodium: 142 mmol/L (ref 134–144)
Total Protein: 6.6 g/dL (ref 6.0–8.5)
eGFR: 57 mL/min/{1.73_m2} — ABNORMAL LOW (ref >59)

## 2021-06-18 LAB — CBC WITH DIFFERENTIAL/PLATELET
Basophils Absolute: 0.1 10*3/uL (ref 0.0–0.2)
Basos: 1 %
EOS (ABSOLUTE): 0.1 10*3/uL (ref 0.0–0.4)
Eos: 2 %
Hematocrit: 39.5 % (ref 34.0–46.6)
Hemoglobin: 13.4 g/dL (ref 11.1–15.9)
Immature Grans (Abs): 0 10*3/uL (ref 0.0–0.1)
Immature Granulocytes: 0 %
Lymphocytes Absolute: 1.3 10*3/uL (ref 0.7–3.1)
Lymphs: 29 %
MCH: 31.4 pg (ref 26.6–33.0)
MCHC: 33.9 g/dL (ref 31.5–35.7)
MCV: 93 fL (ref 79–97)
Monocytes Absolute: 0.3 10*3/uL (ref 0.1–0.9)
Monocytes: 7 %
Neutrophils Absolute: 2.8 10*3/uL (ref 1.4–7.0)
Neutrophils: 61 %
Platelets: 230 10*3/uL (ref 150–450)
RBC: 4.27 x10E6/uL (ref 3.77–5.28)
RDW: 13.1 % (ref 11.7–15.4)
WBC: 4.5 10*3/uL (ref 3.4–10.8)

## 2021-06-18 LAB — LIPID PANEL
Chol/HDL Ratio: 2.5 ratio (ref 0.0–4.4)
Cholesterol, Total: 186 mg/dL (ref 100–199)
HDL: 75 mg/dL (ref >39)
LDL Chol Calc (NIH): 95 mg/dL (ref 0–99)
Triglycerides: 90 mg/dL (ref 0–149)
VLDL Cholesterol Cal: 16 mg/dL (ref 5–40)

## 2021-06-18 LAB — TSH: TSH: 1.59 u[IU]/mL (ref 0.450–4.500)

## 2021-06-18 MED ORDER — SERTRALINE HCL 50 MG PO TABS
50.0000 mg | ORAL_TABLET | Freq: Every day | ORAL | 2 refills | Status: DC
  Filled 2021-06-18: qty 30, 30d supply, fill #0

## 2021-06-23 ENCOUNTER — Ambulatory Visit: Payer: Medicare Other | Admitting: Internal Medicine

## 2021-06-25 ENCOUNTER — Other Ambulatory Visit: Payer: Self-pay | Admitting: Internal Medicine

## 2021-06-25 DIAGNOSIS — R921 Mammographic calcification found on diagnostic imaging of breast: Secondary | ICD-10-CM

## 2021-06-25 DIAGNOSIS — R928 Other abnormal and inconclusive findings on diagnostic imaging of breast: Secondary | ICD-10-CM

## 2021-06-25 DIAGNOSIS — N6489 Other specified disorders of breast: Secondary | ICD-10-CM

## 2021-06-29 ENCOUNTER — Encounter: Payer: Self-pay | Admitting: Internal Medicine

## 2021-07-02 ENCOUNTER — Other Ambulatory Visit: Payer: Self-pay | Admitting: Internal Medicine

## 2021-07-02 ENCOUNTER — Ambulatory Visit
Admission: RE | Admit: 2021-07-02 | Discharge: 2021-07-02 | Disposition: A | Discharge status: Home / Self Care | Payer: Medicare Other | Source: Ambulatory Visit | Attending: Internal Medicine | Admitting: Internal Medicine

## 2021-07-02 ENCOUNTER — Other Ambulatory Visit: Payer: Self-pay

## 2021-07-02 ENCOUNTER — Encounter: Payer: Self-pay | Admitting: Internal Medicine

## 2021-07-02 ENCOUNTER — Ambulatory Visit (INDEPENDENT_AMBULATORY_CARE_PROVIDER_SITE_OTHER): Payer: Medicare Other | Admitting: Internal Medicine

## 2021-07-02 ENCOUNTER — Ambulatory Visit
Admission: RE | Admit: 2021-07-02 | Discharge: 2021-07-02 | Disposition: A | Discharge status: Home / Self Care | Payer: Medicare Other | Attending: Internal Medicine | Admitting: Internal Medicine

## 2021-07-02 VITALS — BP 124/74 | HR 66 | Temp 98.5°F | Ht 64.17 in | Wt 187.2 lb

## 2021-07-02 DIAGNOSIS — R000 Tachycardia, unspecified: Secondary | ICD-10-CM

## 2021-07-02 DIAGNOSIS — I10 Essential (primary) hypertension: Secondary | ICD-10-CM | POA: Diagnosis not present

## 2021-07-02 DIAGNOSIS — M25552 Pain in left hip: Secondary | ICD-10-CM

## 2021-07-02 DIAGNOSIS — R928 Other abnormal and inconclusive findings on diagnostic imaging of breast: Secondary | ICD-10-CM | POA: Diagnosis not present

## 2021-07-02 DIAGNOSIS — M25562 Pain in left knee: Secondary | ICD-10-CM | POA: Insufficient documentation

## 2021-07-02 MED ORDER — SERTRALINE HCL 50 MG PO TABS: 50.0000 mg | ORAL_TABLET | Freq: Every day | ORAL | 2 refills | Status: DC

## 2021-07-02 NOTE — Progress Notes (Incomplete)
BP 124/74    Pulse 66    Temp 98.5 F (36.9 C) (Oral)    Ht 5' 4.17" (1.63 m)    Wt 187 lb 3.2 oz (84.9 kg)    SpO2 99%    BMI 31.96 kg/m    Subjective:    Patient ID: Mary [name], female    DOB: 05/05/1941, 81 y.o.   MRN: [id number]  Chief Complaint  Patient presents with   Fall    Left knee is stiff     HPI: Mary [name] is a 81 y.o. female  Fall  Knee Pain  The incident occurred more than 1 week ago (left knee pain fell the day after her husband died and she couldnt get off the floor.w).   Chief Complaint  Patient presents with   Fall    Left knee is stiff     Relevant past medical, surgical, family and social history reviewed and updated as indicated. Interim medical history since our last visit reviewed. Allergies and medications reviewed and updated.  Review of Systems  Per HPI unless specifically indicated above     Objective:    BP 124/74    Pulse 66    Temp 98.5 F (36.9 C) (Oral)    Ht 5' 4.17" (1.63 m)    Wt 187 lb 3.2 oz (84.9 kg)    SpO2 99%    BMI 31.96 kg/m   Wt Readings from Last 3 Encounters:  07/02/21 187 lb 3.2 oz (84.9 kg)  06/02/21 189 lb (85.7 kg)  03/20/21 187 lb (84.8 kg)    Physical Exam  Results for orders placed or performed in visit on 06/29/21  HM MAMMOGRAPHY  Result Value Ref Range   HM Mammogram 0-4 Bi-Rad 0-4 Bi-Rad, Self Reported Normal        Current Outpatient Medications:    bumetanide (BUMEX) 1 MG tablet, Take one tablet (1mg ) every other day in the AM as needed., Disp: , Rfl:    buPROPion (WELLBUTRIN SR) 150 MG 12 hr tablet, Take 150 mg by mouth 2 (two) times daily., Disp: , Rfl:    Cholecalciferol (VITAMIN D3) 25 MCG (1000 UT) CAPS, Take 1,000 Units by mouth daily., Disp: , Rfl:    fexofenadine (ALLEGRA ALLERGY) 180 MG tablet, Take 1 tablet (180 mg total) by mouth daily., Disp: 10 tablet, Rfl: 1   gabapentin (NEURONTIN) 300 MG capsule, Take 300 mg by mouth 3 (three) times daily., Disp: , Rfl:    LORazepam  (ATIVAN) 1 MG tablet, Take 1 mg by mouth 3 (three) times daily as needed for anxiety., Disp: , Rfl:    meloxicam (MOBIC) 7.5 MG tablet, Mobic 7.5 mg tablet  Take 1 tablet every day by oral route., Disp: , Rfl:    metoprolol succinate (TOPROL-XL) 25 MG 24 hr tablet, Take 1 tablet (25 mg total) by mouth daily., Disp: 90 tablet, Rfl: 3   pravastatin (PRAVACHOL) 40 MG tablet, Take 40 mg by mouth daily., Disp: , Rfl:    sertraline (ZOLOFT) 50 MG tablet, Take 1 tablet (50 mg total) by mouth daily., Disp: 30 tablet, Rfl: 2   traMADol (ULTRAM) 50 MG tablet, tramadol 50 mg tablet, Disp: , Rfl:     Assessment & Plan:  Screening Mammogram - abnl  To have a Diagnostic mammogram and possibly ultrasound of both breasts. The patient will be contacted regarding the findings, and additional imaging will be scheduled.   BI-RADS CATEGORY  0: Incomplete. Need additional imaging evaluation and/or prior  mammograms for comparison.  Tachycardia : stable, sees cards fu and mx per such Hr is at 66 Also has Diastolic CHF on Lasix ?  Has lymphedema and has pumps for such   Depression is on zoloft for such stable, chronic.

## 2021-07-03 ENCOUNTER — Telehealth: Payer: Self-pay | Admitting: Internal Medicine

## 2021-07-03 NOTE — Telephone Encounter (Signed)
Copied from [place] [id number]. Topic: Medicare AWV >> Jul 03, 2021  3:29 PM Lavonia Drafts wrote: Reason for CRM:  N/A unable to leave a message for patient to call back and schedule the Medicare Annual Wellness Visit (AWV) virtually or by telephone.  Last AWV 02/28/21  Please schedule at anytime with [facility]-Nurse Health Advisor.  45 minute appointment  Any questions, please call me at [phone]

## 2021-07-03 NOTE — Progress Notes (Signed)
Please let pt know this was normal.

## 2021-07-10 ENCOUNTER — Ambulatory Visit
Admission: RE | Admit: 2021-07-10 | Discharge: 2021-07-10 | Disposition: A | Discharge status: Home / Self Care | Payer: Medicare Other | Source: Ambulatory Visit | Attending: Internal Medicine | Admitting: Internal Medicine

## 2021-07-10 ENCOUNTER — Other Ambulatory Visit: Payer: Self-pay

## 2021-07-10 DIAGNOSIS — N6489 Other specified disorders of breast: Secondary | ICD-10-CM

## 2021-07-10 DIAGNOSIS — R928 Other abnormal and inconclusive findings on diagnostic imaging of breast: Secondary | ICD-10-CM | POA: Diagnosis not present

## 2021-07-10 DIAGNOSIS — R921 Mammographic calcification found on diagnostic imaging of breast: Secondary | ICD-10-CM

## 2021-07-20 ENCOUNTER — Telehealth (INDEPENDENT_AMBULATORY_CARE_PROVIDER_SITE_OTHER): Payer: Medicare Other | Admitting: Nurse Practitioner

## 2021-07-20 ENCOUNTER — Ambulatory Visit: Payer: Self-pay

## 2021-07-20 ENCOUNTER — Encounter: Payer: Self-pay | Admitting: Nurse Practitioner

## 2021-07-20 DIAGNOSIS — U071 COVID-19: Secondary | ICD-10-CM | POA: Diagnosis not present

## 2021-07-20 MED ORDER — MOLNUPIRAVIR EUA 200MG CAPSULE: 4.0000 | ORAL_CAPSULE | Freq: Two times a day (BID) | ORAL | 0 refills | Status: AC

## 2021-07-20 NOTE — Progress Notes (Signed)
There were no vitals taken for this visit.   Subjective:    Patient ID: Mary [name], female    DOB: March 26, 1941, 81 y.o.   MRN: [id number]  HPI: Mary [name] is a 81 y.o. female  Chief Complaint  Patient presents with   Covid Positive    Pt states she started having a scratchy throat Saturday evening, felt worse yesterday morning with congestion and cough. States she tested positive for covid yesterday.    COVID POSITIVE Patient states her symptoms started on Saturday.  She has a scratchy throat, congestion and cough.  She has been checking her O2 and it is running 95-97%.   Denies fever, SOB, chest pain, chest tightness, wheezing.  Relevant past medical, surgical, family and social history reviewed and updated as indicated. Interim medical history since our last visit reviewed. Allergies and medications reviewed and updated.  Review of Systems  Constitutional:  Negative for fever.  HENT:  Positive for congestion and sore throat.   Respiratory:  Positive for cough. Negative for chest tightness, shortness of breath and wheezing.   Cardiovascular:  Negative for chest pain.   Per HPI unless specifically indicated above     Objective:    There were no vitals taken for this visit.  Wt Readings from Last 3 Encounters:  07/02/21 187 lb 3.2 oz (84.9 kg)  06/02/21 189 lb (85.7 kg)  03/20/21 187 lb (84.8 kg)    Physical Exam Vitals and nursing note reviewed.  HENT:     Head: Normocephalic.     Right Ear: Hearing normal.     Left Ear: Hearing normal.     Nose: Nose normal.  Eyes:     Pupils: Pupils are equal, round, and reactive to light.  Pulmonary:     Effort: Pulmonary effort is normal. No respiratory distress.  Neurological:     Mental Status: She is alert.  Psychiatric:        Mood and Affect: Mood normal.        Behavior: Behavior normal.        Thought Content: Thought content normal.        Judgment: Judgment normal.    Results for orders placed or performed in  visit on 06/29/21  HM MAMMOGRAPHY  Result Value Ref Range   HM Mammogram 0-4 Bi-Rad 0-4 Bi-Rad, Self Reported Normal      Assessment & Plan:   Problem List Items Addressed This Visit   None Visit Diagnoses     COVID-19    -  Primary   Molnupiravir sent to the pahrmacy. Discussed benefits and side effects. Discussed quarnatine and symptoms to monitor for. FU if symptoms worsen.   Relevant Medications   molnupiravir EUA (LAGEVRIO) 200 mg CAPS capsule        Follow up plan: Return if symptoms worsen or fail to improve.   This visit was completed via MyChart due to the restrictions of the COVID-19 pandemic. All issues as above were discussed and addressed. Physical exam was done as above through visual confirmation on MyChart. If it was felt that the patient should be evaluated in the office, they were directed there. The patient verbally consented to this visit. Location of the patient: Home Location of the provider: Office Those involved with this call:  Provider: Jon Billings, NP CMA: Yvonna Alanis, [place] Desk/Registration: Myrlene Broker This encounter was conducted via video.  I spent 20 dedicated to the care of this patient on the date of this encounter  to include previsit review of COVID medications and quarantine guidelines, face to face time with the patient, and post visit ordering of testing.

## 2021-07-20 NOTE — Telephone Encounter (Signed)
Pt has been seen 

## 2021-07-20 NOTE — Telephone Encounter (Signed)
Patient dx with covid yesterday with home test, she has scratchy throat and stuffy head, sinus problems.  She is asking for paxlovid to be sent to [facility], [place]  Phone: [phone]  Fax: [phone]     Chief Complaint: COVID positive yesterday Symptoms: Cough, sore throat, sinus congestion Frequency: Started yesterday Pertinent Negatives: Patient denies fever, SOB Disposition: [] ED /[] Urgent Care (no appt availability in office) / [x] Appointment(In office/virtual)/ []  [facility]/ [] Home Care/ [] Refused Recommended Disposition /[] [facility]/ []  Follow-up with PCP Additional Notes: Asking to be seen sooner than 4:20 virtual if possible. Asking for Paxlovid. Please advise.   Answer Assessment - Initial Assessment Questions 1. COVID-19 DIAGNOSIS: "Who made your COVID-19 diagnosis?" "Was it confirmed by a positive lab test or self-test?" If not diagnosed by a doctor (or NP/PA), ask "Are there lots of cases (community spread) where you live?" Note: See public health department website, if unsure.     Home test 2. COVID-19 EXPOSURE: "Was there any known exposure to COVID before the symptoms began?" CDC Definition of close contact: within 6 feet (2 meters) for a total of 15 minutes or more over a 24-hour period.      No 3. ONSET: "When did the COVID-19 symptoms start?"      Sunday 4. WORST SYMPTOM: "What is your worst symptom?" (e.g., cough, fever, shortness of breath, muscle aches)     Sinus pain, cough 5. COUGH: "Do you have a cough?" If Yes, ask: "How bad is the cough?"       Yes 6. FEVER: "Do you have a fever?" If Yes, ask: "What is your temperature, how was it measured, and when did it start?"     No 7. RESPIRATORY STATUS: "Describe your breathing?" (e.g., shortness of breath, wheezing, unable to speak)      No O2 sat 97% 8. BETTER-SAME-WORSE: "Are you getting better,  staying the same or getting worse compared to yesterday?"  If getting worse, ask, "In what way?"     Same 9. HIGH RISK DISEASE: "Do you have any chronic medical problems?" (e.g., asthma, heart or lung disease, weak immune system, obesity, etc.)     Yes  10. VACCINE: "Have you had the COVID-19 vaccine?" If Yes, ask: "Which one, how many shots, when did you get it?"       Yes 11. BOOSTER: "Have you received your COVID-19 booster?" If Yes, ask: "Which one and when did you get it?"       Yes 12. PREGNANCY: "Is there any chance you are pregnant?" "When was your last menstrual period?"       No 13. OTHER SYMPTOMS: "Do you have any other symptoms?"  (e.g., chills, fatigue, headache, loss of smell or taste, muscle pain, sore throat)       Sore throat, headache 14. O2 SATURATION MONITOR:  "Do you use an oxygen saturation monitor (pulse oximeter) at home?" If Yes, ask "What is your reading (oxygen level) today?" "What is your usual oxygen saturation reading?" (e.g., 95%)       97   Protocols used: Coronavirus (COVID-19) Diagnosed or Suspected-A-AH

## 2021-07-21 ENCOUNTER — Ambulatory Visit: Payer: Medicare Other | Admitting: Occupational Therapy

## 2021-07-27 ENCOUNTER — Ambulatory Visit: Payer: Medicare Other | Admitting: Occupational Therapy

## 2021-07-30 ENCOUNTER — Encounter: Payer: Self-pay | Admitting: Internal Medicine

## 2021-07-30 ENCOUNTER — Telehealth (INDEPENDENT_AMBULATORY_CARE_PROVIDER_SITE_OTHER): Payer: Medicare Other | Admitting: Internal Medicine

## 2021-07-30 VITALS — BP 134/72 | HR 64 | Wt 186.0 lb

## 2021-07-30 DIAGNOSIS — E785 Hyperlipidemia, unspecified: Secondary | ICD-10-CM

## 2021-07-30 DIAGNOSIS — I10 Essential (primary) hypertension: Secondary | ICD-10-CM | POA: Diagnosis not present

## 2021-07-30 DIAGNOSIS — I890 Lymphedema, not elsewhere classified: Secondary | ICD-10-CM | POA: Diagnosis not present

## 2021-07-30 DIAGNOSIS — I5030 Unspecified diastolic (congestive) heart failure: Secondary | ICD-10-CM | POA: Diagnosis not present

## 2021-07-30 NOTE — Progress Notes (Signed)
? ?BP 134/72   Pulse 64   Wt 186 lb (84.4 kg)   BMI 31.75 kg/m?   ? ?Subjective:  ? ? Patient ID: Mary [name], female    DOB: 03-03-1941, 81 y.o.   MRN: [id number] ? ?Chief Complaint  ?Patient presents with  ? Covid follow up  ? Xray  ?  F/U on xray taken of left knee, starting PT on 08/12/21 at [facility] ?  ? ? ?HPI: ?Mary [name] is a 81 y.o. female ? ? ?This visit was completed via video visit through MyChart due to the restrictions of the COVID-19 pandemic. All issues as above were discussed and addressed. Physical exam was done as above through visual confirmation on video through MyChart. If it was felt that the patient should be evaluated in the office, they were directed there. The patient verbally consented to this visit. ?Location of the patient: home ?Location of the provider: home ?Those involved with this call:  ?Provider: Charlynne Cousins, MD ?CMA: Frazier Butt, CMA ?Front Desk/Registration: [employer]  ?Time spent on call: 10 minutes with patient face to face via video conference. More than 50% of this time was spent in counseling and coordination of care. 10 minutes total spent in review of patient's record and preparation of their chart. ? ?Pt is here for a fu post COVID didn't take molnupiravir ? ? ?URI  ?This is a recurrent (18th feb was diagnosed with covid) problem. The current episode started 1 to 4 weeks ago. The problem has been waxing and waning. There has been no fever (symptoms are 95 % gone. no fever). Associated symptoms include congestion. Pertinent negatives include no abdominal pain, chest pain, coughing, ear pain, joint pain, joint swelling, nausea, neck pain, rhinorrhea, sinus pain, sneezing, sore throat, swollen glands, vomiting or wheezing.  ? ?Chief Complaint  ?Patient presents with  ? Covid follow up  ? Xray  ?  F/U on xray taken of left knee, starting PT on 08/12/21 at [facility] ?  ? ? ?Relevant past medical, surgical, family and social history reviewed and updated as  indicated. Interim medical history since our last visit reviewed. ?Allergies and medications reviewed and updated. ? ?Review of Systems  ?HENT:  Positive for congestion. Negative for ear pain, rhinorrhea, sinus pain, sneezing and sore throat.   ?Respiratory:  Negative for cough and wheezing.   ?Cardiovascular:  Negative for chest pain.  ?Gastrointestinal:  Negative for abdominal pain, nausea and vomiting.  ?Musculoskeletal:  Negative for joint pain and neck pain.  ? ?Per HPI unless specifically indicated above ? ?   ?Objective:  ?  ?BP 134/72   Pulse 64   Wt 186 lb (84.4 kg)   BMI 31.75 kg/m?   ?Wt Readings from Last 3 Encounters:  ?07/30/21 186 lb (84.4 kg)  ?07/02/21 187 lb 3.2 oz (84.9 kg)  ?06/02/21 189 lb (85.7 kg)  ?  ?Physical Exam ? ?Results for orders placed or performed in visit on 06/29/21  ?HM MAMMOGRAPHY  ?Result Value Ref Range  ? HM Mammogram 0-4 Bi-Rad 0-4 Bi-Rad, Self Reported Normal  ? ?   ? ? ?Current Outpatient Medications:  ?  bumetanide (BUMEX) 1 MG tablet, Take one tablet (1mg ) every other day in the AM as needed., Disp: , Rfl:  ?  buPROPion (WELLBUTRIN SR) 150 MG 12 hr tablet, Take 150 mg by mouth 2 (two) times daily., Disp: , Rfl:  ?  Cholecalciferol (VITAMIN D3) 25 MCG (1000 UT) CAPS, Take 1,000 Units by  mouth daily., Disp: , Rfl:  ?  fexofenadine (ALLEGRA ALLERGY) 180 MG tablet, Take 1 tablet (180 mg total) by mouth daily., Disp: 10 tablet, Rfl: 1 ?  gabapentin (NEURONTIN) 300 MG capsule, Take 300 mg by mouth 3 (three) times daily., Disp: , Rfl:  ?  LORazepam (ATIVAN) 1 MG tablet, Take 1 mg by mouth 3 (three) times daily as needed for anxiety., Disp: , Rfl:  ?  meloxicam (MOBIC) 7.5 MG tablet, Mobic 7.5 mg tablet  Take 1 tablet every day by oral route., Disp: , Rfl:  ?  metoprolol succinate (TOPROL-XL) 25 MG 24 hr tablet, Take 1 tablet (25 mg total) by mouth daily., Disp: 90 tablet, Rfl: 3 ?  pravastatin (PRAVACHOL) 40 MG tablet, Take 40 mg by mouth daily., Disp: , Rfl:  ?  sertraline  (ZOLOFT) 50 MG tablet, Take 1 tablet (50 mg total) by mouth daily., Disp: 30 tablet, Rfl: 2 ?  traMADol (ULTRAM) 50 MG tablet, tramadol 50 mg tablet, Disp: , Rfl:   ? ? ?Assessment & Plan:  ?Lymphedema  ?To see PT today for rx.  ? ?2. Htn stable on toprol xl  ?Continue current meds.  Medication compliance emphasised. pt advised to keep Bp logs. Pt verbalised understanding of the same. Pt to have a low salt diet . Exercise to reach a goal of at least 150 mins a week.  lifestyle modifications explained and pt understands importance of the above. ?Under good control on current regimen. Continue current regimen. Continue to monitor. Call with any concerns. Refills given. Labs drawn today. ? ?3. Hld is on pravachol for such  ?recheck FLP, check LFT's work on diet, SE of meds explained to pt. low fat and high fiber diet explained to pt. ? ?4. Diastolic dysfunction : is on bumex 1 mg  ?Fu with Dr. Rockey Situ for above ? ? ?Problem List Items Addressed This Visit   ? ?  ? Cardiovascular and Mediastinum  ? Diastolic heart failure ([place]) - Primary  ?  ? ?No orders of the defined types were placed in this encounter. ?  ? ?No orders of the defined types were placed in this encounter. ?  ? ?Follow up plan: ?No follow-ups on file. ? ? ?

## 2021-07-31 ENCOUNTER — Other Ambulatory Visit: Payer: Self-pay

## 2021-07-31 ENCOUNTER — Encounter: Payer: Self-pay | Admitting: Occupational Therapy

## 2021-07-31 ENCOUNTER — Ambulatory Visit: Payer: Medicare Other | Attending: Cardiovascular Disease | Admitting: Occupational Therapy

## 2021-07-31 DIAGNOSIS — R2681 Unsteadiness on feet: Secondary | ICD-10-CM | POA: Insufficient documentation

## 2021-07-31 DIAGNOSIS — R278 Other lack of coordination: Secondary | ICD-10-CM | POA: Insufficient documentation

## 2021-07-31 DIAGNOSIS — M6281 Muscle weakness (generalized): Secondary | ICD-10-CM | POA: Diagnosis present

## 2021-07-31 DIAGNOSIS — R2689 Other abnormalities of gait and mobility: Secondary | ICD-10-CM | POA: Insufficient documentation

## 2021-07-31 DIAGNOSIS — I890 Lymphedema, not elsewhere classified: Secondary | ICD-10-CM | POA: Diagnosis present

## 2021-07-31 NOTE — Therapy (Signed)
[place] MAIN [facility] SERVICES [address] [place], [place], [address] Phone: [phone]   Fax:  [phone]  Occupational Therapy Evaluation  Patient Details  Name: Mary [name] MRN: [id number] Date of Birth: 1940-12-19 Referring Provider (OT): Pershing Cox, MD   Encounter Date: 07/31/2021   OT End of Session - 07/31/21 0924     Visit Number 1    Number of Visits 36    Date for OT Re-Evaluation 10/29/21    OT Start Time 0905    OT Stop Time 1020    OT Time Calculation (min) 75 min    Activity Tolerance Patient tolerated treatment well;No increased pain             Past Medical History:  Diagnosis Date   Arrhythmia    Back pain    Depression with anxiety    Hip pain    Hyperlipidemia    Hypertension    Paroxysmal tachycardia ([place])     Past Surgical History:  Procedure Laterality Date   ABDOMINAL HYSTERECTOMY     BREAST BIOPSY     BREAST EXCISIONAL BIOPSY Left 2002   benign   CARDIAC CATHETERIZATION     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   x2   TOTAL HIP ARTHROPLASTY Bilateral    done twice    There were no vitals filed for this visit.   Subjective [name] - 07/31/21 0919     Subjective  Mary [name] is referred to Occupational Therapy for evaluation and treatment of BLE lymphedema by Ida Rogue, MD. Pt reports insideous onset of BLE leg swelling more than 10 years ago. She knows of no specific event that caused leg swelling initially. She reports leg swelling got suddenly worse after undergoing spinal fusion surgery, L hip replacement and orthopedic pelvis repair, all in 2018. Pt denies hx of cellulitis and leg wounds. She does report hx of a blood clot in her L leg  also s/p back surgery. Pt has not previously undergon lymphedema treatment. She has been unable to find off-the-shelf compression stockings that fit comfortably. She has a basic sequential pneumatic compression "pump", a LymphaPress, which provides retrograde  massage from toes to groin, ending distal to inguinal lymph nodes. Pt's goal for OT is to get swelling under control so she can walk more.    Pertinent History HTN, B TKA x 2, B THA x2, Diastolic heart failure, back pain, tachycardia    Limitations difficulty walking, decreased standing tolerance, impaired transfers and functional mobility, impaired LB dressing, Limited L hip A/PROM, impaired dynamic balance, difficulty w/ LB dressing-fitting shoes, socks, pants    Repetition Increases Symptoms    Special Tests Intake FOTO score = 55/100; + Stemmer    Patient Stated Goals get leg swelling under control; I want to be able to get out and walk    Currently in Pain? Yes    Pain Score --   not rated numerically   Pain Location Leg    Pain Orientation Right;Left    Pain Descriptors / Indicators Tightness;Heaviness;Other (Comment)   fullness   Pain Type Chronic pain    Pain Onset Other (comment)    Pain Frequency Intermittent    Aggravating Factors  hot weather, dependent positioning, what I eat    Pain Relieving Factors elevation , basic pneumatic sequential pump (lymphapress), sleeping in recliner    Effect of Pain on Daily Activities limits functional ambulation, transfer and functional mobility, lower body dressing, walking to perform instrumental ADLs ,  driving               Mary [name] - 07/31/21 0958       [name]   Medical Diagnosis Moderate stage II, BLE lymphedema, L>R,  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 2    Has the patient had a decrease in activity level  because of a fear of falling?  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 2 x month, loves vacation home at [place], games on iPad, sing in church choir,bible study groups,      IADL   Prior Level of Function Shopping I    Fish farm manager independently for [employer]    Prior Level of Function Light Housekeeping I    Light Housekeeping [place] small items, rinses stockings, etc.;Does personal laundry completely;Performs light daily tasks but cannot maintain acceptable level of cleanliness    Prior Level of Function Meal Prep I    Meal Prep --   needs seated rest breaks   Prior Level of Function Community Mobility drives own Barrister's clerk own vehicle      Mobility   Mobility Status History of falls   has transitioned from single point cane to walker recently     Activity Tolerance   Activity Tolerance Endurance does not limit participation in activity      Cognition   Overall Cognitive Status Within Functional Limits for tasks assessed      Observation/Other Assessments   Focus on Therapeutic Outcomes (FOTO)  Intake 55/100      Posture/Postural Control   Posture/Postural Control No significant limitations      Sensation   Light Touch Appears Intact      ROM / Strength   AROM / PROM / Strength AROM;Strength      AROM   Overall AROM  Deficits    AROM [name] Site Knee;Hip;Ankle      Strength   Overall Strength Deficits    Strength [name] Site Hip;Knee;Ankle             Moderate, Stage II,BLE Lymphedema 2/2 suspected CVI and chronic inflammation s/p multiple orthopedic traumas/ joint replacement surgeries  Skin  Description Hyper-Keratosis Peau de Orange Shiny Tight Fibrotic/ Indurated Fatty Doughy Spongy/ boggy      x Hyperpigmentation,     x   Skin dry Flaky WNL Macerated   x      Color Redness Present Varicosities  Blanching Hemosiderin Staining Mottled   x x x      Odor Malodorous Yeast Fungal infection  Absent      x   Temperature Warm Cool wnl    x     Pitting Edema   1+ 2+ 3+ 4+ Non-pitting         x  Girth Symmetrical Asymmetrical                   Distribution     X L>R  Toes to knees          Stemmer Sign Positive Negative   x    Lymphorrhea History Of:  Present Absent      x   Wounds History Of Present Absent Venous Arterial Pressure Mechanical     x        Signs of Infection Redness Warmth Erythema Acute Swelling Drainage Borders                    Sensation Light Touch Deep pressure Hypersensitivty   Present Impaired Present Impaired Absent Impaired   x  x  x     Nails WNL   Fungus nail dystrophy        Hair Growth Symmetrical Asymmetrical       Skin Creases Base of toes  Ankles   Base of Fingers Medial  legs        Abdominal pannus Thigh Lobules  Face/neck   x x                OT Treatments/Exercises (OP) - 07/31/21 0001       Transfers   Transfers Sit to Stand;Stand to Sit    Sit to Stand From chair/3-in-1;With upper extremity assist;With armrests    Stand to Sit With upper extremity assist;To chair/3-in-1      ADLs   LB Dressing impaired    ADL Education Given Yes      Manual Therapy   Manual Therapy Edema management                    OT Education - 07/31/21 0921     Education Details Provided Pt education regarding lymphatic structure and function, etiology, onset patterns and stages of progression. Taught interaction between blood circulatory system and lymphatic circulation.Discussed  impact of gravity and co-morbidities on lymphatic function. Outlined Complete Decongestive Therapy (CDT)  as standard of care and provided in depth information regarding 4 primary components of Intensive and Self Management Phases, including Manual Lymph Drainage (MLD), compression wrapping and garments, skin care, and  therapeutic exercise. Mary [name] discussion with re need for frequent attendance and high burden of care when caregiver is needed, impact of co morbidities. We discussed  the chronic, progressive nature of lymphedema and Importance of daily, ongoing LE self-care essential for limiting progression and infection risk.    Person(s) Educated Patient    Methods Explanation;Demonstration;Handout    Comprehension Verbalized understanding;Returned demonstration;Need further instruction                 OT Long Term Goals - 07/31/21 1224       OT LONG TERM GOAL #1   Title Given this patients low Intake score of 55/100 on the functional outcomes FOTO tool, patient will experience an increase in function of 2 points to improve basic and instrumental ADLs performance, including lymphedema self-care.    Baseline 55/100    Time 12    Period Weeks    Status New    Target Date 10/29/21      OT LONG TERM GOAL #2   Title Pt will demonstrate understanding of lymphedema prevention strategies by identifying and discussing 5 precautions using printed reference (modified assistance) to reduce risk of progression and to limit infection risk.    Baseline Max A  Time 4    Period Days    Status New    Target Date --   4th OT visit     OT LONG TERM GOAL #3   Title Pt will be able to apply knee length, multi-layer, short stretch compression wraps to one limb at a time using gradient techniques with MODIFIED INDEPENDENCE (extra time) to decrease limb volume, to limit infection risk, and to limit lymphedema progression.    Baseline Max A    Time 4    Period Days    Status New    Target Date --   4th OT visit     OT LONG TERM GOAL #4   Title Pt will achieve at least a 10% limb volume reduction below the knee bilaterally to return limb to more normal size and shape, to limit infection risk, to decrease pain, to improve function, and to limit lymphedema progression.    Baseline Dependent    Time 12    Period  Weeks    Status New    Target Date 10/29/21      OT LONG TERM GOAL #5   Title Pt will achieve and sustain a least 85% compliance with all LE self-care home program components throughout Intensive Phase CDT, including daily skin inspection and care, lymphatic pumping ther ex, 23/7 compression wraps and simple self-MLD, to sustain clinical gains made in CDT and to limit lymphedema progression and further functional decline.    Baseline D    Time 12    Period Weeks    Status New    Target Date 10/29/21      Long Term Additional Goals   Additional Long Term Goals Yes      OT LONG TERM GOAL #6   Title Pt will be able to perform all lymphedema self-care home program components using correct techniques with extra time and assistive devices PRN (modified independence), including simple self MLD, lymphatic pumping exercise, don and doff appropriate compression garments/ devices with Max A, and perform daily skin care regime to limit lymphedema progression and infection risk.    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 Frequent falls and diastolic heart failure add strain on lymphatics by increasing the lymphatic load above capacity. Chronic leg swelling and associated pain/ discomfort contribute to difficulty with functional ambulation and functional mobility, functional limitations of basic and instrumental ADLs, productive activities, leisure pursuits and social participation. Pt will benefite from Complete Decongestive Therapy (CDT) on 1 limb at a time to limit fall risk. CDT includes manual lymphatic drainage (MLD), skin care therapeutic exercise for lymphatic pumping,  and compression therapies- short stretch, multi layer wraps initially and then appropriate compression garments. CDT is medically necessary to limit lymphedema progression and further functional decline.    OT Occupational Profile and History Detailed [name]- Review of Records and additional review of physical, cognitive, psychosocial history related to current functional performance    Occupational performance deficits (Please refer to evaluation for details): ADL's;IADL's;Rest and Sleep;Leisure;Social Participation;Work    Marketing executive / Function /  Physical Skills ADL;ROM;IADL;Edema;Balance;Skin integrity;Mobility;Flexibility;Decreased knowledge of precautions;Decreased knowledge of use of DME    Rehab Potential Good    Clinical Decision Making Several treatment options, min-mod task modification necessary    Comorbidities Affecting Occupational Performance: Presence of comorbidities impacting occupational performance    Modification or Assistance to Complete Evaluation  Min-Moderate modification of tasks or assist with assess necessary to complete eval    OT Frequency 2x / week    OT Duration 12 weeks    OT Treatment/Interventions Self-care/ADL training;DME and/or AE instruction;Manual lymph drainage;Compression bandaging;Therapeutic activities;Coping strategies training;Therapeutic exercise;Patient/family education;Manual Therapy    Consulted and Agree with Plan of Care Patient             Patient will benefit from skilled therapeutic intervention in order to improve the following deficits and impairments:   Body Structure / Function / Physical Skills: ADL, ROM, IADL, Edema, Balance, Skin integrity, Mobility, Flexibility, Decreased knowledge of precautions, Decreased knowledge of use of DME       Visit Diagnosis: Lymphedema, not elsewhere classified - Plan: Ot plan of care cert/re-cert    Problem List Patient Active Problem List   Diagnosis Date Noted   Diastolic heart  failure ([place]) 07/30/2021   Hyperlipidemia 07/30/2021   Lymphedema 07/30/2021   Acute pain of left knee 07/02/2021   Mammogram abnormal 07/02/2021   Tachycardia 06/02/2021   Acute non-recurrent frontal sinusitis 06/02/2021   Encounter for screening mammogram for malignant neoplasm of breast 06/02/2021   Gait instability 06/02/2021   Primary hypertension 06/02/2021   Mary Spearman, MS, Mary [name], [facility] 07/31/21 12:36 PM   [name] MAIN [facility] SERVICES [address] [place], [place], [address] Phone: [phone]   Fax:  [phone]  Name: Airi Copado MRN: [id number] Date of Birth: Oct 24, 1940

## 2021-08-03 ENCOUNTER — Ambulatory Visit: Payer: Medicare Other | Admitting: Occupational Therapy

## 2021-08-03 ENCOUNTER — Other Ambulatory Visit: Payer: Self-pay

## 2021-08-03 DIAGNOSIS — I890 Lymphedema, not elsewhere classified: Secondary | ICD-10-CM

## 2021-08-03 NOTE — Therapy (Signed)
[place] MAIN [facility] SERVICES [address] [place], [place], [address] Phone: [phone]   Fax:  [phone]  Occupational Therapy Treatment  Patient Details  Name: Mary [name] MRN: [id number] Date of Birth: 1940/07/18 Referring Provider (OT): Pershing Cox, MD   Encounter Date: 08/03/2021   OT End of Session - 08/03/21 1110     Visit Number 2    Number of Visits 36    Date for OT Re-Evaluation 10/29/21    OT Start Time 9675    OT Stop Time 1210    OT Time Calculation (min) 78 min    Activity Tolerance Patient tolerated treatment well;No increased pain    Behavior During Therapy WFL for tasks assessed/performed             Past Medical History:  Diagnosis Date   Arrhythmia    Back pain    Depression with anxiety    Hip pain    Hyperlipidemia    Hypertension    Paroxysmal tachycardia ([place])     Past Surgical History:  Procedure Laterality Date   ABDOMINAL HYSTERECTOMY     BREAST BIOPSY     BREAST EXCISIONAL BIOPSY Left 2002   benign   CARDIAC CATHETERIZATION     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   x2   TOTAL HIP ARTHROPLASTY Bilateral    done twice    There were no vitals filed for this visit.   Subjective Assessment - 08/03/21 1111     Subjective  Mary [name] presents for OT Rx vist 2/36 to address BLE lymphedema. She denies LE-related leg pain this morning. Pt states she is going to the beach for a a few days and asks questions related to bandaging while away.    Pertinent History HTN, B TKA x 2, B THA x2, Diastolic heart failure, back pain, tachycardia    Limitations difficulty walking, decreased standing tolerance, impaired transfers and functional mobility, impaired LB dressing, Limited L hip A/PROM, impaired dynamic balance, difficulty w/ LB dressing-fitting shoes, socks, pants    Repetition Increases Symptoms    Special Tests Intake FOTO score = 55/100; + Stemmer    Patient Stated Goals get leg swelling under  control; I want to be able to get out and walk    Pain Score 0-No pain    Pain Onset Other (comment)                          OT Treatments/Exercises (OP) - 08/03/21 1321       Manual Therapy   Manual Therapy Edema management;Compression Bandaging    Edema Management initial BLE comparative limb volumetrics    Compression Bandaging LLE multilayer knee length compression wraps using 1 each 8,10 and 12 cm wide short stretch bandages over a single layer of cotton stockinett and circumferentially applied single layer of 0.4 cm thick Rosidal foam.                    OT Education - 08/03/21 1141     Education Details Pt educated on volumetric process and initial measurement outcome. Pt ed for into level short stretch vs long stretch compression    Person(s) Educated Patient    Methods Explanation;Demonstration;Handout    Comprehension Verbalized understanding;Returned demonstration;Need further instruction                 OT Long Term Goals - 07/31/21 1224       OT  LONG TERM GOAL #1   Title Given this patients low Intake score of 55/100 on the functional outcomes FOTO tool, patient will experience an increase in function of 2 points to improve basic and instrumental ADLs performance, including lymphedema self-care.    Baseline 55/100    Time 12    Period Weeks    Status New    Target Date 10/29/21      OT LONG TERM GOAL #2   Title Pt will demonstrate understanding of lymphedema prevention strategies by identifying and discussing 5 precautions using printed reference (modified assistance) to reduce risk of progression and to limit infection risk.    Baseline Max A    Time 4    Period Days    Status New    Target Date --   4th OT visit     OT LONG TERM GOAL #3   Title Pt will be able to apply knee length, multi-layer, short stretch compression wraps to one limb at a time using gradient techniques with MODIFIED INDEPENDENCE (extra time) to  decrease limb volume, to limit infection risk, and to limit lymphedema progression.    Baseline Max A    Time 4    Period Days    Status New    Target Date --   4th OT visit     OT LONG TERM GOAL #4   Title Pt will achieve at least a 10% limb volume reduction below the knee bilaterally to return limb to more normal size and shape, to limit infection risk, to decrease pain, to improve function, and to limit lymphedema progression.    Baseline Dependent    Time 12    Period Weeks    Status New    Target Date 10/29/21      OT LONG TERM GOAL #5   Title Pt will achieve and sustain a least 85% compliance with all LE self-care home program components throughout Intensive Phase CDT, including daily skin inspection and care, lymphatic pumping ther ex, 23/7 compression wraps and simple self-MLD, to sustain clinical gains made in CDT and to limit lymphedema progression and further functional decline.    Baseline D    Time 12    Period Weeks    Status New    Target Date 10/29/21      Long Term Additional Goals   Additional Long Term Goals Yes      OT LONG TERM GOAL #6   Title Pt will be able to perform all lymphedema self-care home program components using correct techniques with extra time and assistive devices PRN (modified independence), including simple self MLD, lymphatic pumping exercise, don and doff appropriate compression garments/ devices with Max A, and perform daily skin care regime to limit lymphedema progression and infection risk.    Baseline D    Time 12    Period Weeks    Status New    Target Date 10/29/21                   Plan - 08/03/21 1308     Clinical Impression Statement Initial BLE comparative limb volumetrics reveal a substantial limb volume diffierential (LVD)  measuring  21.15%, LLE (Rx, dominant) > RLE. Typical LVD is around 2-3%. Commenced LLE, knee length. multilayer compression wrapping using gradient techniques. Pt instructed to try to leave in  place for 24 hours, if she can, but to remove if she cant tolerate it. Adter seeing wraps applied Pt is concerned that she will  not be able to apply wraps herself without help during visit interval. She will discuss this with family over visit interval. Cont as per POC.    OT Occupational Profile and History Detailed Assessment- Review of Records and additional review of physical, cognitive, psychosocial history related to current functional performance    Occupational performance deficits (Please refer to evaluation for details): ADL's;IADL's;Rest and Sleep;Leisure;Social Participation;Work    Marketing executive / Function / Physical Skills ADL;ROM;IADL;Edema;Balance;Skin integrity;Mobility;Flexibility;Decreased knowledge of precautions;Decreased knowledge of use of DME    Rehab Potential Good    Clinical Decision Making Several treatment options, min-mod task modification necessary    Comorbidities Affecting Occupational Performance: Presence of comorbidities impacting occupational performance    Modification or Assistance to Complete Evaluation  Min-Moderate modification of tasks or assist with assess necessary to complete eval    OT Frequency 2x / week    OT Duration 12 weeks    OT Treatment/Interventions Self-care/ADL training;DME and/or AE instruction;Manual lymph drainage;Compression bandaging;Therapeutic activities;Coping strategies training;Therapeutic exercise;Patient/family education;Manual Therapy    Consulted and Agree with Plan of Care Patient             Patient will benefit from skilled therapeutic intervention in order to improve the following deficits and impairments:   Body Structure / Function / Physical Skills: ADL, ROM, IADL, Edema, Balance, Skin integrity, Mobility, Flexibility, Decreased knowledge of precautions, Decreased knowledge of use of DME       Visit Diagnosis: Lymphedema, not elsewhere classified    Problem List Patient Active Problem List   Diagnosis Date  Noted   Diastolic heart failure ([place]) 07/30/2021   Hyperlipidemia 07/30/2021   Lymphedema 07/30/2021   Acute pain of left knee 07/02/2021   Mammogram abnormal 07/02/2021   Tachycardia 06/02/2021   Acute non-recurrent frontal sinusitis 06/02/2021   Encounter for screening mammogram for malignant neoplasm of breast 06/02/2021   Gait instability 06/02/2021   Primary hypertension 06/02/2021   Andrey Spearman, MS, OTR/L, CLT-LANA 08/03/21 1:35 PM   [place] [address] [place], [place], [address] Phone: [phone]   Fax:  [phone]  Name: Shyan Scalisi MRN: [id number] Date of Birth: 1941/04/15

## 2021-08-03 NOTE — Patient Instructions (Addendum)

## 2021-08-04 ENCOUNTER — Ambulatory Visit: Payer: Medicare Other

## 2021-08-04 DIAGNOSIS — R2681 Unsteadiness on feet: Secondary | ICD-10-CM

## 2021-08-04 DIAGNOSIS — I890 Lymphedema, not elsewhere classified: Secondary | ICD-10-CM | POA: Diagnosis not present

## 2021-08-04 DIAGNOSIS — R2689 Other abnormalities of gait and mobility: Secondary | ICD-10-CM

## 2021-08-04 DIAGNOSIS — R278 Other lack of coordination: Secondary | ICD-10-CM

## 2021-08-04 DIAGNOSIS — M6281 Muscle weakness (generalized): Secondary | ICD-10-CM

## 2021-08-04 NOTE — Therapy (Signed)
[place] MAIN [facility] SERVICES [address] [place], [place], [address] Phone: [phone]   Fax:  [phone]  Physical Therapy Evaluation  Patient Details  Name: Mary [name] MRN: [id number] Date of Birth: [birth year] Referring Provider (PT): Charlynne Cousins, MD   Encounter Date: 08/04/2021   PT End of Session - 08/05/21 1159     Visit Number 1    Number of Visits 25    Date for PT Re-Evaluation 10/27/21    PT Start Time 0254    PT Stop Time 1533    PT Time Calculation (min) 59 min    Equipment Utilized During Treatment Gait belt    Activity Tolerance Patient tolerated treatment well    Behavior During Therapy WFL for tasks assessed/performed             Past Medical History:  Diagnosis Date   Arrhythmia    Back pain    Depression with anxiety    Hip pain    Hyperlipidemia    Hypertension    Paroxysmal tachycardia ([place])     Past Surgical History:  Procedure Laterality Date   ABDOMINAL HYSTERECTOMY     BREAST BIOPSY     BREAST EXCISIONAL BIOPSY Left 2002   benign   CARDIAC CATHETERIZATION     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   x2   TOTAL HIP ARTHROPLASTY Bilateral    done twice    There were no vitals filed for this visit.    Subjective Assessment - 08/04/21 1432     Subjective Pt is a pleasant 81 y/o female referred to PT for gait instability. Pt ambulates with RW. She reports onset of gait instability started several years ago. 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 Pt has had PT in the past following surgeries. Pt reports hx of frequent falls. She has had 2 falls in past 6 months. Pt believes falls are due to difficulty picking up L foot and dragging L foot. Pt reports no injury with falls.  Pt being seen by OT for lymphedema, reports LLE more affected than RLE. Pt currently lives alone. Her spouse passed away recently in 06-17-22. Her adult  son and DIL live nearby and can assist her. Pt denies any dizziness or lightheadedness. Pt reports no pain currently.    Pertinent History Pt is a pleasant 81 y/o female referred to PT for gait instability. Pt ambulates with RW. She reports onset of gait instability started several years ago. 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 Pt has had PT in past following surgeries. Pt reports hx of frequent falls. She has had 2 falls in past 6 months. Pt believes falls are due to difficulty picking up L foot and dragging L foot. Pt reports no injury with falls.  Pt being seen by OT for lymphedema, reports LLE more affected than RLE. Pt currently lives alone. Her spouse passed away recently in 06-17-22. Her adult son and DIL live nearby and can assist her. Pt denies any dizziness or lightheadedness. Pt reports no pain currently. 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    Limitations Lifting;Standing;Walking;House hold activities    How long can you sit comfortably? not limited    How long  can you stand comfortably? Pt reports only limited due to need to stretch lower back    How long can you walk comfortably? At least 30 minutes    Diagnostic tests per chart recent imaging (February) of hip/knee unremarkable.    Patient Stated Goals Pt would like to be able to walk without a cane or at least not have to use her RW.    Currently in Pain? No/denies                SUBJECTIVE Chief complaint: unsteadiness, gait difficulties History: see subjective for details Referring Dx: gait instability Referring Provider: Charlynne Cousins, MD Recent changes in overall health/medication: No Directional pattern for falls: pt reports she tends to feel as if she's going backwards. However, pt reports tripping over her feet going forward.  Prior history of physical  therapy for balance: None Falls in the last 6 months: Yes     OBJECTIVE  Musculoskeletal Tremor: Absent Bulk: Normal Tone: Normal  Posture Seated: rounded shoulders, slight forward head posture, kyphotic    Gait With 4WW, flexed knees/hip and trunk throughout gait cycle with decreased heel strike B, antalgic appearing gait (however pt reports no pain) Without 4WW further decrease in step length, skuffing and R hip circumduction    Strength R/L 4+/5 B  Sensation Grossly intact to light touch bilateral LEs as determined by testing   Coordination/Cerebellar Finger to Nose: WNL Heel to Shin: limited due to decreased hip mobility Rapid alternating movements: WNL Finger Opposition: WNL   FUNCTIONAL OUTCOME MEASURES   Results Comments  BERG 40/56 Fall risk, in need of intervention  5TSTS 15.36 sec using BUEs.   10 Meter Gait Speed 0.75 m/s with 4WW Below normative values for full community ambulation  ABC Scale 50.63%   FOTO 50 Goal score of 52     ASSESSMENT Clinical Impression: Pt is a pleasant 81 year-old female referred for gait instability. Pt with extensive orthopedic surgery hx (2011, 2018) and hx of frequent falls. PT examination reveals deficits in gait speed/ability, gait mechanics, strength and balance AEB performance on 10MWT, MMT, and Berg balance testing. Pt 5xSTS score in addition to Berg score indicate that the pt is at an increased risk for future falls. The pt will benefit from skilled PT services to address these deficits, improve balance, and decrease risk for future falls.   Interventions-  REVIEWED performance, technique, sets, reps, rest and frequency of HEP interventions with pt Access Code: [email or web address] URL: [email or web address] Date: 08/04/2021 Prepared by: Ricard Dillon  Exercises Seated March with Resistance - 1 x daily - 5 x weekly - 3 sets - 20 reps Sit to Stand with Counter Support - 1 x daily - 5 x weekly - 2 sets - 10  reps   Subjective Assessment - 08/04/21 1432     Subjective Pt is a pleasant 81 y/o female referred to PT for gait instability. Pt ambulates with RW. She reports onset of gait instability started several years ago. 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 Pt has had PT in the past following surgeries. Pt reports hx of frequent falls. She has had 2 falls in past 6 months. Pt believes falls are due to difficulty picking up L foot and dragging L foot. Pt reports no injury with falls.  Pt being seen by OT for lymphedema, reports LLE more affected than RLE. Pt currently lives alone.  Her spouse passed away recently in 06-25-2022. Her adult son and DIL live nearby and can assist her. Pt denies any dizziness or lightheadedness. Pt reports no pain currently.    Pertinent History Pt is a pleasant 81 y/o female referred to PT for gait instability. Pt ambulates with RW. She reports onset of gait instability started several years ago. 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 Pt has had PT in past following surgeries. Pt reports hx of frequent falls. She has had 2 falls in past 6 months. Pt believes falls are due to difficulty picking up L foot and dragging L foot. Pt reports no injury with falls.  Pt being seen by OT for lymphedema, reports LLE more affected than RLE. Pt currently lives alone. Her spouse passed away recently in 2022-06-25. Her adult son and DIL live nearby and can assist her. Pt denies any dizziness or lightheadedness. Pt reports no pain currently. 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    Limitations Lifting;Standing;Walking;House hold activities    How long can you sit comfortably? not limited    How long can you stand comfortably? Pt reports only limited due to need to  stretch lower back    How long can you walk comfortably? At least 30 minutes    Diagnostic tests per chart recent imaging (February) of hip/knee unremarkable.    Patient Stated Goals Pt would like to be able to walk without a cane or at least not have to use her RW.    Currently in Pain? No/denies                [facility] PT Assessment - 08/04/21 1444       Assessment   Medical Diagnosis Gait instability    Referring Provider (PT) Vigg, Avanti, MD    Onset Date/Surgical Date --   pt reports onset years ago   Prior Therapy yes      Precautions   Precautions Fall      Restrictions   Weight Bearing Restrictions No      Balance Screen   Has the patient fallen in the past 6 months Yes    How many times? 2      [place] residence    Living Arrangements Alone    Available Help at Discharge Family    Type of [place] to enter    Entrance Stairs-Number of Steps 1    Entrance Stairs-Rails None    Home Layout One level    [place] - single point;Walker - 4 wheels;Grab bars - tub/shower;Grab bars - toilet;Bedside commode;Shower seat - built in      Prior Function   Level of Independence Independent      Cognition   Overall Cognitive Status Within Functional Limits for tasks assessed      Balance   Balance Assessed Yes      Standardized Balance Assessment   Standardized Balance Assessment Berg Balance Test      Berg Balance Test   Sit to Stand Able to stand  independently using hands    Standing Unsupported Able to stand safely 2 minutes    Sitting with Back Unsupported but Feet Supported on Floor or Stool Able to sit safely and securely 2 minutes  Stand to Sit Controls descent by using hands    Transfers Able to transfer safely, definite need of hands    Standing Unsupported with Eyes Closed Able to stand 10 seconds safely    Standing Unsupported with Feet Together Able to place feet together  independently and stand 1 minute safely    From Standing, Reach Forward with Outstretched Arm Can reach forward >12 cm safely (5")    From Standing Position, Pick up Object from Floor Able to pick up shoe, needs supervision    From Standing Position, Turn to Look Behind Over each Shoulder Looks behind from both sides and weight shifts well    Turn 360 Degrees Able to turn 360 degrees safely one side only in 4 seconds or less    Standing Unsupported, Alternately Place Feet on Step/Stool Able to complete >2 steps/needs minimal assist    Standing Unsupported, One Foot in Front Needs help to step but can hold 15 seconds    Standing on One Leg Unable to try or needs assist to prevent fall    Total Score 40                PT Education - 08/05/21 1159     Education Details exam findings, goals, POC, HEP    Person(s) Educated Patient    Methods Explanation;Demonstration;Verbal cues;Tactile cues    Comprehension Verbalized understanding;Returned demonstration;Need further instruction;Verbal cues required              PT Short Term Goals - 08/05/21 1217       PT SHORT TERM GOAL #1   Title Pt will be independent with HEP in order to improve strength and balance in order to decrease fall risk and improve function at home and work.    Baseline 3/7: provided pt with initial HEP    Time 6    Period Weeks    Status New    Target Date 09/15/21               PT Long Term Goals - 08/05/21 1217       PT LONG TERM GOAL #1   Title Patient will increase FOTO score to equal to or greater than 52 to demonstrate statistically significant improvement in mobility and quality of life.    Baseline 3/7: 50    Time 12    Period Weeks    Status New    Target Date 10/27/21      PT LONG TERM GOAL #2   Title Pt will improve BERG by at least 3 points in order to demonstrate clinically significant improvement in balance.    Baseline 3/7: 40/56    Time 12    Period Weeks    Status New     Target Date 10/27/21      PT LONG TERM GOAL #3   Title Pt will decrease 5TSTS by at least 3 seconds in order to demonstrate clinically significant improvement in LE strength.    Baseline 3/7: 15.36 sec with use of BUEs to assist    Time 12    Period Weeks    Status New    Target Date 10/27/21      PT LONG TERM GOAL #4   Title Pt will improve ABC by at least 13% in order to demonstrate clinically significant improvement in balance confidence.    Baseline 3/7: 50.63%    Time 12    Period Weeks    Status New    Target  Date 10/27/21      PT LONG TERM GOAL #5   Title Patient will increase 10 meter walk test to >1.72ms as to improve gait speed for better community ambulation and to reduce fall risk.    Baseline 3/7: 0.75 m/s with 4WW    Time 12    Period Weeks    Status New    Target Date 10/27/21                    Plan - 08/05/21 1200     Clinical Impression Statement Pt is a pleasant 81year-old female referred for gait instability. Pt with extensive orthopedic surgery hx (2011, 2018) and hx of frequent falls. PT examination reveals deficits in gait speed/ability, gait mechanics, strength and balance AEB performance on 10MWT, MMT, and Berg balance testing. Pt 5xSTS score in addition to Berg score indicate that the pt is at an increased risk for future falls. The pt will benefit from skilled PT services to address these deficits, improve balance, and decrease risk for future falls.    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, B THA x2, diastolic heart failure, back pain, tachycardia    Examination-Activity Limitations Bend;Reach Overhead;Stairs;Transfers;Lift;Squat;Locomotion Level;Carry;Stand;Dressing    Examination-Participation Restrictions Volunteer;Community Activity;Shop;Cleaning;Laundry;Yard Work    SMerchant navy officer Evolving/Moderate complexity    Clinical Decision Making Moderate    Rehab Potential Good    PT Frequency 2x / week    PT Duration 12 weeks    PT Treatment/Interventions ADLs/Self Care Home Management;Aquatic Therapy;Biofeedback;Canalith Repostioning;Cryotherapy;Electrical Stimulation;Moist Heat;Traction;Ultrasound;DME Instruction;Gait training;Functional mBiochemist, clinicalTherapeutic activities;Therapeutic exercise;Balance training;Neuromuscular re-education;Patient/family education;Orthotic Fit/Training;Wheelchair mobility training;Manual techniques;Passive range of motion;Scar mobilization;Dry needling;Energy conservation;Splinting;Taping;Vestibular;Visual/perceptual remediation/compensation;Joint Manipulations    PT Next Visit Plan strengthening, gait, balance    PT Home Exercise Plan Access Code: [email or web address]   Consulted and Agree with Plan of Care Patient             Patient will benefit from skilled therapeutic intervention in order to improve the following deficits and impairments:  Abnormal gait, Decreased balance, Decreased endurance, Decreased mobility, Difficulty walking, Hypomobility, Decreased range of motion, Improper body mechanics, Decreased activity tolerance, Decreased coordination, Decreased strength, Postural dysfunction  Visit Diagnosis: Unsteadiness on feet  Other abnormalities of gait and mobility  Muscle weakness (generalized)  Other lack of coordination     Problem List Patient Active Problem List   Diagnosis Date Noted   Diastolic heart failure ([place] 07/30/2021   Hyperlipidemia 07/30/2021   Lymphedema 07/30/2021   Acute pain of left knee 07/02/2021   Mammogram abnormal 07/02/2021   Tachycardia 06/02/2021   Acute non-recurrent frontal sinusitis 06/02/2021   Encounter for screening mammogram for malignant neoplasm of breast 06/02/2021   Gait instability 06/02/2021   Primary hypertension 06/02/2021    HZollie Pee PT 08/05/2021, 12:21  PM  [place]RB[address] [place] [address]Phone: [phone]  Fax:  [phone] Name: Mary BaumMRN: [id number] of Birth: 701-Oct-1942

## 2021-08-05 ENCOUNTER — Other Ambulatory Visit: Payer: Self-pay

## 2021-08-05 ENCOUNTER — Ambulatory Visit: Payer: Medicare Other | Admitting: Occupational Therapy

## 2021-08-05 DIAGNOSIS — I890 Lymphedema, not elsewhere classified: Secondary | ICD-10-CM | POA: Diagnosis not present

## 2021-08-05 NOTE — Therapy (Signed)
[place] MAIN [facility] SERVICES [address] [place], [place], [address] Phone: [phone]   Fax:  [phone]  Occupational Therapy Treatment  Patient Details  Name: Mary [name] MRN: [id number] Date of Birth: [birth year] Referring Provider (OT): Pershing Cox, MD   Encounter Date: 08/05/2021   OT End of Session - 08/05/21 1110     Visit Number 3    Number of Visits 36    Date for OT Re-Evaluation 10/29/21    OT Start Time 1101    OT Stop Time 1205    OT Time Calculation (min) 64 min    Activity Tolerance Patient tolerated treatment well;No increased pain    Behavior During Therapy WFL for tasks assessed/performed             Past Medical History:  Diagnosis Date   Arrhythmia    Back pain    Depression with anxiety    Hip pain    Hyperlipidemia    Hypertension    Paroxysmal tachycardia ([place])     Past Surgical History:  Procedure Laterality Date   ABDOMINAL HYSTERECTOMY     BREAST BIOPSY     BREAST EXCISIONAL BIOPSY Left 2002   benign   CARDIAC CATHETERIZATION     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   x2   TOTAL HIP ARTHROPLASTY Bilateral    done twice    There were no vitals filed for this visit.   Subjective Assessment - 08/05/21 1222     Subjective  Mary [name] presents for OT Rx vist 3/36 to address BLE lymphedema. She denies LE-related leg pain this morning. Pt reports her daughter in law will attend future OT visits to learn to assist with compression wrapps.    Pertinent History HTN, B TKA x 2, B THA x2, Diastolic heart failure, back pain, tachycardia    Limitations difficulty walking, decreased standing tolerance, impaired transfers and functional mobility, impaired LB dressing, Limited L hip A/PROM, impaired dynamic balance, difficulty w/ LB dressing-fitting shoes, socks, pants    Repetition Increases Symptoms    Special Tests Intake FOTO score = 55/100; + [place]    Patient Stated Goals get leg swelling under control;  I want to be able to get out and walk    Pain Onset Other (comment)                          OT Treatments/Exercises (OP) - 08/05/21 1223       ADLs   ADL Education Given Yes      Manual Therapy   Manual Therapy Edema management;Compression Bandaging    Compression Bandaging LLE multilayer knee length compression wraps using 1 each 8,10 and 12 cm wide short stretch bandages over a single layer of cotton stockinett and circumferentially applied single layer of 0.4 cm thick Rosidal foam.                    OT Education - 08/05/21 1223     Education Details Emphasis of Pt edu on multilayer short stretch compression wrapping vs long stretch wraps. How short stretch wraps move  lymphatic congestion by accentuating pumping action of leg muscles. Demonstrated application and educated on wear and care routines.    Person(s) Educated Patient    Methods Explanation;Demonstration;Handout    Comprehension Verbalized understanding;Returned demonstration;Need further instruction                 OT Long Term Goals -  07/31/21 1224       OT LONG TERM GOAL #1   Title Given this patients low Intake score of 55/100 on the functional outcomes FOTO tool, patient will experience an increase in function of 2 points to improve basic and instrumental ADLs performance, including lymphedema self-care.    Baseline 55/100    Time 12    Period Weeks    Status New    Target Date 10/29/21      OT LONG TERM GOAL #2   Title Pt will demonstrate understanding of lymphedema prevention strategies by identifying and discussing 5 precautions using printed reference (modified assistance) to reduce risk of progression and to limit infection risk.    Baseline Max A    Time 4    Period Days    Status New    Target Date --   4th OT visit     OT LONG TERM GOAL #3   Title Pt will be able to apply knee length, multi-layer, short stretch compression wraps to one limb at a time using  gradient techniques with MODIFIED INDEPENDENCE (extra time) to decrease limb volume, to limit infection risk, and to limit lymphedema progression.    Baseline Max A    Time 4    Period Days    Status New    Target Date --   4th OT visit     OT LONG TERM GOAL #4   Title Pt will achieve at least a 10% limb volume reduction below the knee bilaterally to return limb to more normal size and shape, to limit infection risk, to decrease pain, to improve function, and to limit lymphedema progression.    Baseline Dependent    Time 12    Period Weeks    Status New    Target Date 10/29/21      OT LONG TERM GOAL #5   Title Pt will achieve and sustain a least 85% compliance with all LE self-care home program components throughout Intensive Phase CDT, including daily skin inspection and care, lymphatic pumping ther ex, 23/7 compression wraps and simple self-MLD, to sustain clinical gains made in CDT and to limit lymphedema progression and further functional decline.    Baseline D    Time 12    Period Weeks    Status New    Target Date 10/29/21      Long Term Additional Goals   Additional Long Term Goals Yes      OT LONG TERM GOAL #6   Title Pt will be able to perform all lymphedema self-care home program components using correct techniques with extra time and assistive devices PRN (modified independence), including simple self MLD, lymphatic pumping exercise, don and doff appropriate compression garments/ devices with Max A, and perform daily skin care regime to limit lymphedema progression and infection risk.    Baseline D    Time 12    Period Weeks    Status New    Target Date 10/29/21                   Plan - 08/05/21 1216     Clinical Impression Statement Response to initial OT visit with compression wrapping evidenced today by excess wrap left over compared with last application. Pt tolerated wraps well and has no complaint today other than mild itching after removing them.  Emphasis of visit today on learning how to apply wraps correctly to achieve gradient effect. She is unable to reach feet and distal legs  due to impaired hip A/PROM. Pt edu for wrapping focused today on her training her to direct caregiver for optimal results. Cont as per POC.    OT Occupational Profile and History Detailed Assessment- Review of Records and additional review of physical, cognitive, psychosocial history related to current functional performance    Occupational performance deficits (Please refer to evaluation for details): ADL's;IADL's;Rest and Sleep;Leisure;Social Participation;Work    Marketing executive / Function / Physical Skills ADL;ROM;IADL;Edema;Balance;Skin integrity;Mobility;Flexibility;Decreased knowledge of precautions;Decreased knowledge of use of DME    Rehab Potential Good    Clinical Decision Making Several treatment options, min-mod task modification necessary    Comorbidities Affecting Occupational Performance: Presence of comorbidities impacting occupational performance    Modification or Assistance to Complete Evaluation  Min-Moderate modification of tasks or assist with assess necessary to complete eval    OT Frequency 2x / week    OT Duration 12 weeks    OT Treatment/Interventions Self-care/ADL training;DME and/or AE instruction;Manual lymph drainage;Compression bandaging;Therapeutic activities;Coping strategies training;Therapeutic exercise;Patient/family education;Manual Therapy    Consulted and Agree with Plan of Care Patient             Patient will benefit from skilled therapeutic intervention in order to improve the following deficits and impairments:   Body Structure / Function / Physical Skills: ADL, ROM, IADL, Edema, Balance, Skin integrity, Mobility, Flexibility, Decreased knowledge of precautions, Decreased knowledge of use of DME       Visit Diagnosis: Lymphedema, not elsewhere classified    Problem List Patient Active Problem List    Diagnosis Date Noted   Diastolic heart failure ([place]) 07/30/2021   Hyperlipidemia 07/30/2021   Lymphedema 07/30/2021   Acute pain of left knee 07/02/2021   Mammogram abnormal 07/02/2021   Tachycardia 06/02/2021   Acute non-recurrent frontal sinusitis 06/02/2021   Encounter for screening mammogram for malignant neoplasm of breast 06/02/2021   Gait instability 06/02/2021   Primary hypertension 06/02/2021    Andrey Spearman, MS, OTR/L, [facility] 08/05/21 12:25 PM   [place] MAIN [facility] SERVICES [address] [place], [place], [address] Phone: [phone]   Fax:  [phone]  Name: Mary [name] MRN: [id number] Date of Birth: 1941-01-06

## 2021-08-10 ENCOUNTER — Encounter: Payer: Medicare Other | Admitting: Occupational Therapy

## 2021-08-12 ENCOUNTER — Ambulatory Visit: Payer: Medicare Other | Admitting: Occupational Therapy

## 2021-08-12 ENCOUNTER — Encounter: Payer: Self-pay | Admitting: Physical Therapy

## 2021-08-12 ENCOUNTER — Other Ambulatory Visit: Payer: Self-pay

## 2021-08-12 ENCOUNTER — Ambulatory Visit: Payer: Medicare Other | Admitting: Physical Therapy

## 2021-08-12 DIAGNOSIS — I890 Lymphedema, not elsewhere classified: Secondary | ICD-10-CM | POA: Diagnosis not present

## 2021-08-12 NOTE — Patient Instructions (Signed)
Access Code: [email or web address] ?URL: [email or web address] ?Date: 08/12/2021 ?Prepared by: Blanche East ? ?Exercises ?Standing Hip Extension with Counter Support - 1 x daily - 7 x weekly - 1 sets - 10 reps ? ?

## 2021-08-12 NOTE — Therapy (Signed)
?[facility] ?[place], [place], [address] ?Phone: [phone]   Fax:  [phone] ? ?Physical Therapy Treatment ? ?Patient Details  ?Name: Mary [name] ?MRN: [id number] ?Date of Birth: 1940/11/28 ?Referring Provider (PT): Charlynne Cousins, MD ? ? ?Encounter Date: 08/12/2021 ? ? PT End of Session - 08/12/21 1023   ? ? Visit Number 2   ? Number of Visits 25   ? Date for PT Re-Evaluation 10/27/21   ? PT Start Time 1018   ? PT Stop Time 1100   ? PT Time Calculation (min) 42 min   ? Equipment Utilized During Treatment Gait belt   ? Activity Tolerance Patient tolerated treatment well   ? Behavior During Therapy [facility] for tasks assessed/performed   ? ?  ?  ? ?  ? ? ?Past Medical History:  ?Diagnosis Date  ? Arrhythmia   ? Back pain   ? Depression with anxiety   ? Hip pain   ? Hyperlipidemia   ? Hypertension   ? Paroxysmal tachycardia ([place])   ? ? ?Past Surgical History:  ?Procedure Laterality Date  ? ABDOMINAL HYSTERECTOMY    ? BREAST BIOPSY    ? BREAST EXCISIONAL BIOPSY Left 2002  ? benign  ? CARDIAC CATHETERIZATION    ? 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  ? x2  ? TOTAL HIP ARTHROPLASTY Bilateral   ? done twice  ? ? ?There were no vitals filed for this visit. ? ? Subjective Assessment - 08/12/21 1022   ? ? Subjective Patient reports going to the beach this weekend. She reports having a good time. She denies any pain currently; She reports feeling like her legs are doing better.   ? Pertinent History Pt is a pleasant 81 y/o female referred to PT for gait instability. Pt ambulates with RW. She reports onset of gait instability started several years ago. 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 Pt has had PT in past following surgeries. Pt reports hx of frequent falls. She has had 2 falls in past 6 months. Pt believes falls are due to difficulty picking up L foot and dragging L foot. Pt  reports no injury with falls.  Pt being seen by OT for lymphedema, reports LLE more affected than RLE. Pt currently lives alone. Her spouse passed away recently in 07-06-2022. Her adult son and DIL live nearby and can assist her. Pt denies any dizziness or lightheadedness. Pt reports no pain currently. 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   ? Limitations Lifting;Standing;Walking;House hold activities   ? How long can you sit comfortably? not limited   ? How long can you stand comfortably? Pt reports only limited due to need to stretch lower back   ? How long can you walk comfortably? At least 30 minutes   ? Diagnostic tests per chart recent imaging (February) of hip/knee unremarkable.   ? Patient Stated Goals Pt would like to be able to walk without a cane or at least not have to use her RW.   ? Currently in Pain? No/denies   ? ?  ?  ? ?  ? ? ? ? ?TREATMENT: ?Standing on airex pad: ?Feet together eyes open 30 sec hold x2 reps, eyes closed 10 sec hold x2 reps ?Patient required cues for rotating hips to  netural often shifting to RLE, exhibits increased sway and unsteadiness with eyes closed requiring min A for safety; ? ?Patient hooklying: ?Feet apart, windshield wiper trunk rotation x5 reps each direction; ?PT performed passive single knee to chest stretch 20 sec hold ? Progressed to passive circles clockwise/counterclockwise x5 reps each LE, x2 sets on LLE due to increased tightness;  ?PT performed passive SLR hamstring stretch 20 sec hold, progressed to ankle DF/PF x10 reps ?PT performed passive hip adductor stretch with knee flexed, extended x5 reps each LE ? ?Instructed patient in hooklying bridges x10 reps, decreased gluteal activation on LLE, likely from weakness and decreased knee flexion ROM; ? ? ?Standing on airex: ?Following exercise, patient able to stand on airex with feet close together, unsupported x30 sec with less instability. She  does exhibit increased RLE knee flexion in stance. ? ?PT assessed LE leg length ?RLE 92cm, LLE 89 cm- unsure of accuracy as LLE has lymphedema and was wrapped therefore difficulty to determine medial malleolus position. ?Patient unable to try insert in LLE shoe due to lymphedema ? ?PT expressed to patient how leg length discrepancy could contribute to imbalance, will continue to monitor;  ? ?Patient does exhibit weakness in posterior hip musculature ?Added standing hip extension to HEP, see patient instructions ? ?Patient tolerated session well. She denies any increase in pain;  ? ? ? ? ? ? ? ? ? ? ? ? ? ? ? ? ? ? ? ? ? ? ? ? ? PT Education - 08/12/21 1023   ? ? Education Details exercise/balance;   ? Person(s) Educated Patient   ? Methods Explanation;Verbal cues   ? Comprehension Verbalized understanding;Returned demonstration;Verbal cues required;Need further instruction   ? ?  ?  ? ?  ? ? ? PT Short Term Goals - 08/05/21 1217   ? ?  ? PT SHORT TERM GOAL #1  ? Title Pt will be independent with HEP in order to improve strength and balance in order to decrease fall risk and improve function at home and work.   ? Baseline 3/7: provided pt with initial HEP   ? Time 6   ? Period Weeks   ? Status New   ? Target Date 09/15/21   ? ?  ?  ? ?  ? ? ? ? PT Long Term Goals - 08/05/21 1217   ? ?  ? PT LONG TERM GOAL #1  ? Title Patient will increase FOTO score to equal to or greater than 52 to demonstrate statistically significant improvement in mobility and quality of life.   ? Baseline 3/7: 50   ? Time 12   ? Period Weeks   ? Status New   ? Target Date 10/27/21   ?  ? PT LONG TERM GOAL #2  ? Title Pt will improve BERG by at least 3 points in order to demonstrate clinically significant improvement in balance.   ? Baseline 3/7: 40/56   ? Time 12   ? Period Weeks   ? Status New   ? Target Date 10/27/21   ?  ? PT LONG TERM GOAL #3  ? Title Pt will decrease 5TSTS by at least 3 seconds in order to demonstrate clinically significant  improvement in LE strength.   ? Baseline 3/7: 15.36 sec with use of BUEs to assist   ? Time 12   ? Period Weeks   ? Status New   ? Target Date 10/27/21   ?  ? PT LONG TERM  GOAL #4  ? Title Pt will improve ABC by at least 13% in order to demonstrate clinically significant improvement in balance confidence.   ? Baseline 3/7: 50.63%   ? Time 12   ? Period Weeks   ? Status New   ? Target Date 10/27/21   ?  ? PT LONG TERM GOAL #5  ? Title Patient will increase 10 meter walk test to >1.4ms as to improve gait speed for better community ambulation and to reduce fall risk.   ? Baseline 3/7: 0.75 m/s with 4WW   ? Time 12   ? Period Weeks   ? Status New   ? Target Date 10/27/21   ? ?  ?  ? ?  ? ? ? ? ? ? ? ? Plan - 08/12/21 1258   ? ? Clinical Impression Statement Patient motivated and participated well within session. She stand with abnormal posture with RLE hip forward, right knee flexed. PT assessed LE leg length with RLE appearing longer, but measurement difficulty as patient has LLE lymphedema and had LE wrapped. Patient unable to place insert in LLE shoe due to lymphedema. she also has a significant PMH of 2 hip surgery and 1 knee surgery on RLE. Unsure if that is contributing to poor posture. In supine, patient unable to achieve full knee extension. PT concerned that abnormal posture could be contributing to imbalance. Instructed patient in ROM/strengthening exercise. She was able to exhibit slight improvement in stance control following exercise with better positioning. Patient would benefit from additional skilled PT Intervention to improve strength, balance and mobility;   ? Personal Factors and Comorbidities Age;Past/Current Experience;Time since onset of injury/illness/exacerbation;Fitness;Comorbidity 3+   ? 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 Examination-Activity Limitations Bend;Reach Overhead;Stairs;Transfers;Lift;Squat;Locomotion  Level;Carry;Stand;Dressing   ? Examination-Participation Restrictions Volunteer;Community Activity;Shop;Cleaning;Laundry;YValla LeaverWork   ? Stability/Clinical Decision Making Evolving/Moderate complexity   ? Rehab Potential Go

## 2021-08-12 NOTE — Therapy (Signed)
[facility] ?[place], [place], [address] ?Phone: [phone]   Fax:  [phone] ? ?Occupational Therapy Treatment ? ?Patient Details  ?Name: Mary [name] ?MRN: [id number] ?Date of Birth: Aug 20, 1940 ?Referring Provider (OT): Pershing Cox, MD ? ? ?Encounter Date: 08/12/2021 ? ? OT End of Session - 08/12/21 0910   ? ? Number of Visits 36   ? Date for OT Re-Evaluation 10/29/21   ? OT Start Time 0900   ? OT Stop Time 1007   ? OT Time Calculation (min) 67 min   ? Activity Tolerance Patient tolerated treatment well;No increased pain   ? Behavior During Therapy [facility] for tasks assessed/performed   ? ?  ?  ? ?  ? ? ?Past Medical History:  ?Diagnosis Date  ? Arrhythmia   ? Back pain   ? Depression with anxiety   ? Hip pain   ? Hyperlipidemia   ? Hypertension   ? Paroxysmal tachycardia ([place])   ? ? ?Past Surgical History:  ?Procedure Laterality Date  ? ABDOMINAL HYSTERECTOMY    ? BREAST BIOPSY    ? BREAST EXCISIONAL BIOPSY Left 2002  ? benign  ? CARDIAC CATHETERIZATION    ? 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  ? x2  ? TOTAL HIP ARTHROPLASTY Bilateral   ? done twice  ? ? ?There were no vitals filed for this visit. ? ? Subjective Assessment - 08/12/21 0911   ? ? Subjective  Mary [name] presents for OT Rx vist 4/36 to address BLE lymphedema. She denies LE-related leg pain this morning. Pt reports her family trip was a success. She hasn't applied compression since last visit. Pt denies LE-related leg pain this morning.   ? Pertinent History HTN, B TKA x 2, B THA x2, Diastolic heart failure, back pain, tachycardia   ? Limitations difficulty walking, decreased standing tolerance, impaired transfers and functional mobility, impaired LB dressing, Limited L hip A/PROM, impaired dynamic balance, difficulty w/ LB dressing-fitting shoes, socks, pants   ? Repetition Increases Symptoms   ? Special Tests Intake FOTO score = 55/100; + [place]   ? Patient Stated Goals get leg swelling  under control; I want to be able to get out and walk   ? Currently in Pain? No/denies   ? Pain Onset Other (comment)   ? ?  ?  ? ?  ? ? ? ? ? ? ? ? ? ? ? ? ? ? ? OT Treatments/Exercises (OP) - 08/12/21 0913   ? ?  ? ADLs  ? ADL Education Given Yes   ?  ? Manual Therapy  ? Manual Therapy Edema management;Manual Lymphatic Drainage (MLD);Compression Bandaging   ? Manual Lymphatic Drainage (MLD) Commenced MLD to LLE/LLQ MLD utilizing short neck sequence, functional L inguinal LN, and proximal to distal J strokes to thigh, knee, leg and foot. Good tolerance.   ? Compression Bandaging LLE multilayer knee length compression wraps using 1 each 8,10 and 12 cm wide short stretch bandages over a single layer of cotton stockinett and circumferentially applied single layer of 0.4 cm thick Rosidal foam.   ? ?  ?  ? ?  ? ? ? ? ? ? ? ? ? OT Education - 08/12/21 1224   ? ? Education Details Emphasis of skilled LE self-care training today on  simple self-MLD. Pt has mastered J stroke. Technique will improve with practice.   ? Person(s) Educated Patient   ? Methods Explanation;Demonstration;Handout   ? Comprehension  Verbalized understanding;Returned demonstration;Need further instruction   ? ?  ?  ? ?  ? ? ? ? ? ? OT Long Term Goals - 07/31/21 1224   ? ?  ? OT LONG TERM GOAL #1  ? Title Given this patient?s low Intake score of 55/100 on the functional outcomes FOTO tool, patient will experience an increase in function of 2 points to improve basic and instrumental ADLs performance, including lymphedema self-care.   ? Baseline 55/100   ? Time 12   ? Period Weeks   ? Status New   ? Target Date 10/29/21   ?  ? OT LONG TERM GOAL #2  ? Title Pt will demonstrate understanding of lymphedema prevention strategies by identifying and discussing 5 precautions using printed reference (modified assistance) to reduce risk of progression and to limit infection risk.   ? Baseline Max A   ? Time 4   ? Period Days   ? Status New   ? Target Date --   4th  OT visit  ?  ? OT LONG TERM GOAL #3  ? Title Pt will be able to apply knee length, multi-layer, short stretch compression wraps to one limb at a time using gradient techniques with MODIFIED INDEPENDENCE (extra time) to decrease limb volume, to limit infection risk, and to limit lymphedema progression.   ? Baseline Max A   ? Time 4   ? Period Days   ? Status New   ? Target Date --   4th OT visit  ?  ? OT LONG TERM GOAL #4  ? Title Pt will achieve at least a 10% limb volume reduction below the knee bilaterally to return limb to more normal size and shape, to limit infection risk, to decrease pain, to improve function, and to limit lymphedema progression.   ? Baseline Dependent   ? Time 12   ? Period Weeks   ? Status New   ? Target Date 10/29/21   ?  ? OT LONG TERM GOAL #5  ? Title Pt will achieve and sustain a least 85% compliance with all LE self-care home program components throughout Intensive Phase CDT, including daily skin inspection and care, lymphatic pumping ther ex, 23/7 compression wraps and simple self-MLD, to sustain clinical gains made in CDT and to limit lymphedema progression and further functional decline.   ? Baseline D   ? Time 12   ? Period Weeks   ? Status New   ? Target Date 10/29/21   ?  ? Long Term Additional Goals  ? Additional Long Term Goals Yes   ?  ? OT LONG TERM GOAL #6  ? Title Pt will be able to perform all lymphedema self-care home program components using correct techniques with extra time and assistive devices PRN (modified independence), including simple self MLD, lymphatic pumping exercise, don and doff appropriate compression garments/ devices with Max A, and perform daily skin care regime to limit lymphedema progression and infection risk.   ? Baseline D   ? Time 12   ? Period Weeks   ? Status New   ? Target Date 10/29/21   ? ?  ?  ? ?  ? ? ? ? ? ? ? ? Plan - 08/12/21 1218   ? ? Clinical Impression Statement Pt returns to OT today after week interval. Pt has not wrapped since last  visit when we commenced teaching these skills.Pt working on getting daughter in law to assist her with wrapping. Pt  needs a PM appoinment for DIL to attend for CG training . We're working on [employer]. Commenced MLD today to LLE/LLQ utilizing non cancer-related pathways, including ipsilateral functional inguinal lymph nodes and deep abdominal lymphatics. Pt tolerated treatment well and demonstrated understanding of self-care training by modeling J Strokes during session. Applied LLE knee length compression wraps as established last visit. Cont as per POC.   ? OT Occupational Profile and History Detailed Assessment- Review of Records and additional review of physical, cognitive, psychosocial history related to current functional performance   ? Occupational performance deficits (Please refer to evaluation for details): ADL's;IADL's;Rest and Sleep;Leisure;Social Participation;Work   ? Body Structure / Function / Physical Skills ADL;ROM;IADL;Edema;Balance;Skin integrity;Mobility;Flexibility;Decreased knowledge of precautions;Decreased knowledge of use of DME   ? Rehab Potential Good   ? Clinical Decision Making Several treatment options, min-mod task modification necessary   ? Comorbidities Affecting Occupational Performance: Presence of comorbidities impacting occupational performance   ? Modification or Assistance to Complete Evaluation  Min-Moderate modification of tasks or assist with assess necessary to complete eval   ? OT Frequency 2x / week   ? OT Duration 12 weeks   ? OT Treatment/Interventions Self-care/ADL training;DME and/or AE instruction;Manual lymph drainage;Compression bandaging;Therapeutic activities;Coping strategies training;Therapeutic exercise;Patient/family education;Manual Therapy   ? Consulted and Agree with Plan of Care Patient   ? ?  ?  ? ?  ? ? ?Patient will benefit from skilled therapeutic intervention in order to improve the following deficits and impairments:   ?Body Structure /  Function / Physical Skills: ADL, ROM, IADL, Edema, Balance, Skin integrity, Mobility, Flexibility, Decreased knowledge of precautions, Decreased knowledge of use of DME ?  ?  ? ? ?Visit Diagnosis: ?Lymphed

## 2021-08-14 ENCOUNTER — Ambulatory Visit: Payer: Medicare Other | Admitting: Physical Therapy

## 2021-08-14 ENCOUNTER — Other Ambulatory Visit: Payer: Self-pay

## 2021-08-14 ENCOUNTER — Ambulatory Visit: Payer: Medicare Other | Admitting: Occupational Therapy

## 2021-08-14 DIAGNOSIS — I890 Lymphedema, not elsewhere classified: Secondary | ICD-10-CM

## 2021-08-14 DIAGNOSIS — M6281 Muscle weakness (generalized): Secondary | ICD-10-CM

## 2021-08-14 DIAGNOSIS — R2681 Unsteadiness on feet: Secondary | ICD-10-CM

## 2021-08-14 DIAGNOSIS — R2689 Other abnormalities of gait and mobility: Secondary | ICD-10-CM

## 2021-08-14 NOTE — Therapy (Signed)
[place] ?[facility] ?[place], [place], [address] ?Phone: [phone]   Fax:  [phone] ? ?Occupational Therapy Treatment ? ?Patient Details  ?Name: Mary [name] ?MRN: [id number] ?Date of Birth: 07/31/1940 ?Referring Provider (OT): Pershing Cox, MD ? ? ?Encounter Date: 08/14/2021 ? ? OT End of Session - 08/14/21 1213   ? ? Visit Number 5   ? Number of Visits 36   ? Date for OT Re-Evaluation 10/29/21   ? OT Start Time 1000   ? OT Stop Time 1110   ? OT Time Calculation (min) 70 min   ? Activity Tolerance Patient tolerated treatment well;No increased pain   ? Behavior During Therapy [facility] for tasks assessed/performed   ? ?  ?  ? ?  ? ? ?Past Medical History:  ?Diagnosis Date  ? Arrhythmia   ? Back pain   ? Depression with anxiety   ? Hip pain   ? Hyperlipidemia   ? Hypertension   ? Paroxysmal tachycardia ([place])   ? ? ?Past Surgical History:  ?Procedure Laterality Date  ? ABDOMINAL HYSTERECTOMY    ? BREAST BIOPSY    ? BREAST EXCISIONAL BIOPSY Left 2002  ? benign  ? CARDIAC CATHETERIZATION    ? 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  ? x2  ? TOTAL HIP ARTHROPLASTY Bilateral   ? done twice  ? ? ?There were no vitals filed for this visit. ? ? Subjective Assessment - 08/14/21 1225   ? ? Subjective  Mary [name] presents for OT Rx vist 5/36 to address BLE lymphedema. She denies LE-related leg pain this morning.   ? Pertinent History HTN, B TKA x 2, B THA x2, Diastolic heart failure, back pain, tachycardia   ? Limitations difficulty walking, decreased standing tolerance, impaired transfers and functional mobility, impaired LB dressing, Limited L hip A/PROM, impaired dynamic balance, difficulty w/ LB dressing-fitting shoes, socks, pants   ? Repetition Increases Symptoms   ? Special Tests Intake FOTO score = 55/100; + [place]   ? Patient Stated Goals get leg swelling under control; I want to be able to get out and walk   ? Pain Onset Other (comment)   ? ?  ?  ? ?   ? ? ? ? ? ? ? ? ? ? ? ? ? ? ? OT Treatments/Exercises (OP) - 08/14/21 1226   ? ?  ? ADLs  ? ADL Education Given Yes   ?  ? Manual Therapy  ? Manual Therapy Edema management;Manual Lymphatic Drainage (MLD);Compression Bandaging   ? Manual Lymphatic Drainage (MLD) MLD to LLE/LLQ MLD utilizing short neck sequence, functional L inguinal LN, and proximal to distal J strokes to thigh, knee, leg and foot. Good tolerance.   ? Compression Bandaging LLE multilayer knee length compression wraps using 1 each 8,10 and 12 cm wide short stretch bandages over a single layer of cotton stockinett and circumferentially applied single layer of 0.4 cm thick Rosidal foam.   ? ?  ?  ? ?  ? ? ? ? ? ? ? ? ? OT Education - 08/14/21 1226   ? ? Education Details Emphasis of skilled LE self-care training today on  simple self-MLD. Pt has mastered J stroke. Technique will improve with practice.   ? Person(s) Educated Patient   ? Methods Explanation;Demonstration;Handout   ? Comprehension Verbalized understanding;Returned demonstration;Need further instruction   ? ?  ?  ? ?  ? ? ? ? ? ? OT Long Term  Goals - 07/31/21 1224   ? ?  ? OT LONG TERM GOAL #1  ? Title Given this patient?s low Intake score of 55/100 on the functional outcomes FOTO tool, patient will experience an increase in function of 2 points to improve basic and instrumental ADLs performance, including lymphedema self-care.   ? Baseline 55/100   ? Time 12   ? Period Weeks   ? Status New   ? Target Date 10/29/21   ?  ? OT LONG TERM GOAL #2  ? Title Pt will demonstrate understanding of lymphedema prevention strategies by identifying and discussing 5 precautions using printed reference (modified assistance) to reduce risk of progression and to limit infection risk.   ? Baseline Max A   ? Time 4   ? Period Days   ? Status New   ? Target Date --   4th OT visit  ?  ? OT LONG TERM GOAL #3  ? Title Pt will be able to apply knee length, multi-layer, short stretch compression wraps to one limb  at a time using gradient techniques with MODIFIED INDEPENDENCE (extra time) to decrease limb volume, to limit infection risk, and to limit lymphedema progression.   ? Baseline Max A   ? Time 4   ? Period Days   ? Status New   ? Target Date --   4th OT visit  ?  ? OT LONG TERM GOAL #4  ? Title Pt will achieve at least a 10% limb volume reduction below the knee bilaterally to return limb to more normal size and shape, to limit infection risk, to decrease pain, to improve function, and to limit lymphedema progression.   ? Baseline Dependent   ? Time 12   ? Period Weeks   ? Status New   ? Target Date 10/29/21   ?  ? OT LONG TERM GOAL #5  ? Title Pt will achieve and sustain a least 85% compliance with all LE self-care home program components throughout Intensive Phase CDT, including daily skin inspection and care, lymphatic pumping ther ex, 23/7 compression wraps and simple self-MLD, to sustain clinical gains made in CDT and to limit lymphedema progression and further functional decline.   ? Baseline D   ? Time 12   ? Period Weeks   ? Status New   ? Target Date 10/29/21   ?  ? Long Term Additional Goals  ? Additional Long Term Goals Yes   ?  ? OT LONG TERM GOAL #6  ? Title Pt will be able to perform all lymphedema self-care home program components using correct techniques with extra time and assistive devices PRN (modified independence), including simple self MLD, lymphatic pumping exercise, don and doff appropriate compression garments/ devices with Max A, and perform daily skin care regime to limit lymphedema progression and infection risk.   ? Baseline D   ? Time 12   ? Period Weeks   ? Status New   ? Target Date 10/29/21   ? ?  ?  ? ?  ? ? ? ? ? ? ? ? Plan - 08/14/21 1213   ? ? Clinical Impression Statement Continued MLD to RLE/RLQ as established.Pt tolerated well without pain. Applied wraps as establlished. Pt's questions answered throughout session to her satisfaction. She is working on arranging for assistance  between  visits for compression wrapping. This has not happened yet, and consequently limb volume is not reduced  to date. Cont as per POC.   ?  OT Occupational Profile and History Detailed Assessment- Review of Records and additional review of physical, cognitive, psychosocial history related to current functional performance   ? Occupational performance deficits (Please refer to evaluation for details): ADL's;IADL's;Rest and Sleep;Leisure;Social Participation;Work   ? Body Structure / Function / Physical Skills ADL;ROM;IADL;Edema;Balance;Skin integrity;Mobility;Flexibility;Decreased knowledge of precautions;Decreased knowledge of use of DME   ? Rehab Potential Good   ? Clinical Decision Making Several treatment options, min-mod task modification necessary   ? Comorbidities Affecting Occupational Performance: Presence of comorbidities impacting occupational performance   ? Modification or Assistance to Complete Evaluation  Min-Moderate modification of tasks or assist with assess necessary to complete eval   ? OT Frequency 2x / week   ? OT Duration 12 weeks   ? OT Treatment/Interventions Self-care/ADL training;DME and/or AE instruction;Manual lymph drainage;Compression bandaging;Therapeutic activities;Coping strategies training;Therapeutic exercise;Patient/family education;Manual Therapy   ? Consulted and Agree with Plan of Care Patient   ? ?  ?  ? ?  ? ? ?Patient will benefit from skilled therapeutic intervention in order to improve the following deficits and impairments:   ?Body Structure / Function / Physical Skills: ADL, ROM, IADL, Edema, Balance, Skin integrity, Mobility, Flexibility, Decreased knowledge of precautions, Decreased knowledge of use of DME ?  ?  ? ? ?Visit Diagnosis: ?Lymphedema, not elsewhere classified ? ? ? ?Problem List ?Patient Active Problem List  ? Diagnosis Date Noted  ? Diastolic heart failure ([place]) 07/30/2021  ? Hyperlipidemia 07/30/2021  ? Lymphedema 07/30/2021  ? Acute pain of left knee  07/02/2021  ? Mammogram abnormal 07/02/2021  ? Tachycardia 06/02/2021  ? Acute non-recurrent frontal sinusitis 06/02/2021  ? Encounter for screening mammogram for malignant neoplasm of breast 06/02/2021  ? Gait instabilit

## 2021-08-14 NOTE — Therapy (Signed)
[place] [facility] ?[place], [place], [address] ?Phone: [phone]   Fax:  [phone] ? ?Physical Therapy Treatment ? ?Patient Details  ?Name: Mary [name] ?MRN: [id number] ?Date of Birth: 30-Oct-1940 ?Referring Provider (PT): Charlynne Cousins, MD ? ? ?Encounter Date: 08/14/2021 ? ? PT End of Session - 08/14/21 0930   ? ? Visit Number 3   ? Number of Visits 25   ? Date for PT Re-Evaluation 10/27/21   ? PT Start Time 0037   ? PT Stop Time 1000   ? PT Time Calculation (min) 45 min   ? Equipment Utilized During Treatment Gait belt   ? Activity Tolerance Patient tolerated treatment well   ? Behavior During Therapy [facility] for tasks assessed/performed   ? ?  ?  ? ?  ? ? ?Past Medical History:  ?Diagnosis Date  ? Arrhythmia   ? Back pain   ? Depression with anxiety   ? Hip pain   ? Hyperlipidemia   ? Hypertension   ? Paroxysmal tachycardia ([place])   ? ? ?Past Surgical History:  ?Procedure Laterality Date  ? ABDOMINAL HYSTERECTOMY    ? BREAST BIOPSY    ? BREAST EXCISIONAL BIOPSY Left 2002  ? benign  ? CARDIAC CATHETERIZATION    ? 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  ? x2  ? TOTAL HIP ARTHROPLASTY Bilateral   ? done twice  ? ? ?There were no vitals filed for this visit. ? ? Subjective Assessment - 08/14/21 1100   ? ? Subjective Pt reports she is doing well with no significant changes since last session.   ? Pertinent History Pt is a pleasant 81 y/o female referred to PT for gait instability. Pt ambulates with RW. She reports onset of gait instability started several years ago. 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 Pt has had PT in past following surgeries. Pt reports hx of frequent falls. She has had 2 falls in past 6 months. Pt believes falls are due to difficulty picking up L foot and dragging L foot. Pt reports no injury with falls.  Pt being seen by OT for lymphedema, reports LLE more  affected than RLE. Pt currently lives alone. Her spouse passed away recently in June 28, 2022. Her adult son and DIL live nearby and can assist her. Pt denies any dizziness or lightheadedness. Pt reports no pain currently. 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   ? Limitations Lifting;Standing;Walking;House hold activities   ? How long can you sit comfortably? not limited   ? How long can you stand comfortably? Pt reports only limited due to need to stretch lower back   ? How long can you walk comfortably? At least 30 minutes   ? Diagnostic tests per chart recent imaging (February) of hip/knee unremarkable.   ? Patient Stated Goals Pt would like to be able to walk without a cane or at least not have to use her RW.   ? ?  ?  ? ?  ? ? ?TREATMENT: ? ? ?Patient hooklying: ?Feet apart, windshield wiper trunk rotation x5 reps each direction; ?PT performed passive single knee to chest stretch 20 sec hold ? Progressed to passive circles clockwise/counterclockwise x5 reps each LE, x2 sets on LLE due to increased tightness;  ?PT performed passive SLR hamstring stretch 20  sec hold, progressed to ankle DF/PF x10 reps ? ?Hooklying  ?bridges 2 x 10 reps,  ?Clamshell GTB 2 x 12 x 5 sec hold for improving gluteal muscle activation  ? ?Unless otherwise stated, CGA was provided and gait belt donned in order to ensure pt safety for the below activities  ? ?Standing on airex pad: ? ?Feet together eyes open 30 sec hold x2 reps, eyes closed 10 sec hold x2 reps ?Patient required cues for rotating hips to netural often shifting to RLE, exhibits increased sway and unsteadiness with eyes closed requiring min A for safety; ?Semitandem on airex pad x 45 sec ea LE  ?1 foot on airex pad 1 foot on 6 inch step x 45 sec ea  ?- mod sway noted and intermittent UE utilization ? ?STS with airex pad on chair to increase height 1 x 5  ?1 attempt without airex pad and pt unable to perform  with good balnce and required correction via CGA ?2 x 10 with STS with airex pad on chair and without UE assist, good use of UE for weight shift and momentum, no LOB  ? ?12 x heel to toe rocks on 1/2 foam roller with B UE assist and cues for correct muscle activation, instructed could complete similar exercise without foam roller at home for continued muscular strengthening, pt verbalized and demonstrated understanding.  ?- SBA  ? ?Pt denied any pain with interventions this session  ? ? ? ? ? ? ? ? ? ? ? ? ? ? ? ? ? ? ? ? ? ? ? ? ? ? PT Education - 08/14/21 1101   ? ? Education Details exercise / balance   ? Person(s) Educated Patient   ? Methods Explanation   ? Comprehension Verbalized understanding   ? ?  ?  ? ?  ? ? ? PT Short Term Goals - 08/05/21 1217   ? ?  ? PT SHORT TERM GOAL #1  ? Title Pt will be independent with HEP in order to improve strength and balance in order to decrease fall risk and improve function at home and work.   ? Baseline 3/7: provided pt with initial HEP   ? Time 6   ? Period Weeks   ? Status New   ? Target Date 09/15/21   ? ?  ?  ? ?  ? ? ? ? PT Long Term Goals - 08/05/21 1217   ? ?  ? PT LONG TERM GOAL #1  ? Title Patient will increase FOTO score to equal to or greater than 52 to demonstrate statistically significant improvement in mobility and quality of life.   ? Baseline 3/7: 50   ? Time 12   ? Period Weeks   ? Status New   ? Target Date 10/27/21   ?  ? PT LONG TERM GOAL #2  ? Title Pt will improve BERG by at least 3 points in order to demonstrate clinically significant improvement in balance.   ? Baseline 3/7: 40/56   ? Time 12   ? Period Weeks   ? Status New   ? Target Date 10/27/21   ?  ? PT LONG TERM GOAL #3  ? Title Pt will decrease 5TSTS by at least 3 seconds in order to demonstrate clinically significant improvement in LE strength.   ? Baseline 3/7: 15.36 sec with use of BUEs to assist   ? Time 12   ? Period Weeks   ? Status New   ?  Target Date 10/27/21   ?  ? PT LONG TERM  GOAL #4  ? Title Pt will improve ABC by at least 13% in order to demonstrate clinically significant improvement in balance confidence.   ? Baseline 3/7: 50.63%   ? Time 12   ? Period Weeks   ? Status New   ? Target Date 10/27/21   ?  ? PT LONG TERM GOAL #5  ? Title Patient will increase 10 meter walk test to >1.81ms as to improve gait speed for better community ambulation and to reduce fall risk.   ? Baseline 3/7: 0.75 m/s with 4WW   ? Time 12   ? Period Weeks   ? Status New   ? Target Date 10/27/21   ? ?  ?  ? ?  ? ? ? ? ? ? ? ? Plan - 08/14/21 1043   ? ? Clinical Impression Statement Pt motivated and participated well within session. Continued with him mobility and spine mobility and stretchign at start of session and pt tolerated this well with good response. Pt progressed with several balance interventions and had most difficluty with tasks with eyes closed interventions. Pt did have ipmrovement with STS efficacy on airex pad ( on seat) this session and was able to complete for reps but could not perofmr safe STS without UE assist without this. Pt will continue to benefit from skilled PT in order to imprve her balance, mobility and improve fall risk.   ? Personal Factors and Comorbidities Age;Past/Current Experience;Time since onset of injury/illness/exacerbation;Fitness;Comorbidity 3+   ? 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 Examination-Activity Limitations Bend;Reach Overhead;Stairs;Transfers;Lift;Squat;Locomotion Level;Carry;Stand;Dressing   ? Examination-Participation Restrictions Volunteer;Community Activity;Shop;Cleaning;Laundry;YValla LeaverWork   ? Stability/Clinical Decision Making Evolving/Moderate complexity   ? Rehab Potential Good   ? PT Frequency 2x / week   ? PT Duration 12 weeks   ? PT Treatment/Interventions ADLs/Self Care Home Management;Aquatic Therapy;Biofeedback;Canalith Repostioning;Cryotherapy;Electrical Stimulation;Moist  Heat;Traction;Ultrasound;DME Instruction;Gait training;Functional mBiochemist, clinicalTherapeutic activities;Therapeutic exercise;Balance training;Neuromuscular re-education;Patient/family education;Orthotic Fit/

## 2021-08-17 ENCOUNTER — Encounter: Payer: Self-pay | Admitting: Physical Therapy

## 2021-08-17 ENCOUNTER — Ambulatory Visit: Payer: Medicare Other | Admitting: Occupational Therapy

## 2021-08-17 ENCOUNTER — Ambulatory Visit: Payer: Medicare Other | Admitting: Physical Therapy

## 2021-08-17 ENCOUNTER — Other Ambulatory Visit: Payer: Self-pay

## 2021-08-17 DIAGNOSIS — M6281 Muscle weakness (generalized): Secondary | ICD-10-CM

## 2021-08-17 DIAGNOSIS — R2689 Other abnormalities of gait and mobility: Secondary | ICD-10-CM

## 2021-08-17 DIAGNOSIS — R2681 Unsteadiness on feet: Secondary | ICD-10-CM

## 2021-08-17 DIAGNOSIS — I890 Lymphedema, not elsewhere classified: Secondary | ICD-10-CM | POA: Diagnosis not present

## 2021-08-17 NOTE — Therapy (Signed)
[facility] ?[place], [place], [address] ?Phone: [phone]   Fax:  [phone] ? ?Physical Therapy Treatment ? ?Patient Details  ?Name: Mary [name] ?MRN: [id number] ?Date of Birth: 06/20/1940 ?Referring Provider (PT): Charlynne Cousins, MD ? ? ?Encounter Date: 08/17/2021 ? ? PT End of Session - 08/17/21 1253   ? ? Visit Number 4   ? Number of Visits 25   ? Date for PT Re-Evaluation 10/27/21   ? PT Start Time 1302   ? PT Stop Time 0277   ? PT Time Calculation (min) 43 min   ? Equipment Utilized During Treatment Gait belt   ? Activity Tolerance Patient tolerated treatment well   ? Behavior During Therapy [facility] for tasks assessed/performed   ? ?  ?  ? ?  ? ? ?Past Medical History:  ?Diagnosis Date  ? Arrhythmia   ? Back pain   ? Depression with anxiety   ? Hip pain   ? Hyperlipidemia   ? Hypertension   ? Paroxysmal tachycardia ([place])   ? ? ?Past Surgical History:  ?Procedure Laterality Date  ? ABDOMINAL HYSTERECTOMY    ? BREAST BIOPSY    ? BREAST EXCISIONAL BIOPSY Left 2002  ? benign  ? CARDIAC CATHETERIZATION    ? 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  ? x2  ? TOTAL HIP ARTHROPLASTY Bilateral   ? done twice  ? ? ?There were no vitals filed for this visit. ? ? Subjective Assessment - 08/17/21 1303   ? ? Subjective Patient reports doing well. no pain. No new changes;   ? Pertinent History Pt is a pleasant 81 y/o female referred to PT for gait instability. Pt ambulates with RW. She reports onset of gait instability started several years ago. 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 Pt has had PT in past following surgeries. Pt reports hx of frequent falls. She has had 2 falls in past 6 months. Pt believes falls are due to difficulty picking up L foot and dragging L foot. Pt reports no injury with falls.  Pt being seen by OT for lymphedema, reports LLE more affected than RLE. Pt currently  lives alone. Her spouse passed away recently in 07-10-22. Her adult son and DIL live nearby and can assist her. Pt denies any dizziness or lightheadedness. Pt reports no pain currently. 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   ? Limitations Lifting;Standing;Walking;House hold activities   ? How long can you sit comfortably? not limited   ? How long can you stand comfortably? Pt reports only limited due to need to stretch lower back   ? How long can you walk comfortably? At least 30 minutes   ? Diagnostic tests per chart recent imaging (February) of hip/knee unremarkable.   ? Patient Stated Goals Pt would like to be able to walk without a cane or at least not have to use her RW.   ? Currently in Pain? No/denies   ? Multiple Pain Sites No   ? ?  ?  ? ?  ? ? ? ? ? ?TREATMENT: ?Patient hooklying: ?Feet apart, windshield wiper trunk rotation x5 reps each direction; ?PT performed passive single knee to chest stretch 20 sec hold ?            Progressed to passive  circles clockwise/counterclockwise x5 reps each LE, x2 sets on LLE due to increased tightness;  ?PT performed passive SLR hamstring stretch 20 sec hold, progressed to ankle DF/PF x10 reps ?PT performed passive hip adduction for IT band stretch 5 sec hold x5 reps each LE;  ?Feet apart, windshield wiper trunk rotation x5 reps each direction; ?Patient tolerated stretches well, she does feel like her LLE was tighter than RLE;  ? ?Instructed patient in hooklying bridges x10 reps, improved ROM this session with better even hips compared to previous session;  ?Hooklying: ?-SLR hip flexion x10 reps each LE with cues for proper positioning to isolate hip flexor activation;  ?-Hip abducted with green tband for tension, bridges to challenge gluteal activation x10 reps ?Finished with passive single knee to chest stretch 20 sec hold x1 rep each;  ? ?  ?  ?Standing on airex pad: ?Feet together eyes open 30  sec hold x2 reps, eyes closed 10 sec hold x2 reps ?Does exhibit increased posterior sway with initial eyes closed: less sway today compared to previous sessions;  ? ?Standing on airex pad: ?Heel/toe  raise with fingertip rail assist;  ?  ?Stepping over orange hurdle with 2 UE assist: ?Forward/backward step x5 reps ?Side stepping x5 reps ?Exhibits heavy lean on UE requiring cues to increase step length for foot clearance;  ? ?Standing on firm suface with RUE rail assist: ?-alternate toe taps to 6 inch step x10 reps ?-standing one foot on step, one foot on firm surface:  ? Unsupported standing 15 sec hold x2 reps ? Progressed to unsupported standing with head turns side/side x5 reps each foot on step; ? ?Patient tolerated session well. She expressed desire to improve balance/gait safety as her grandson is getting married next month. ? ? ?Will advance HEP next session;  ? ?  ? ? ? ? ? ? ? ? ? ? ? ? ? ? ? ? ? ? ? ? ? ? ? PT Education - 08/17/21 1253   ? ? Education Details exercise/positioning; balance;   ? Person(s) Educated Patient   ? Methods Explanation;Verbal cues   ? Comprehension Verbalized understanding;Returned demonstration;Verbal cues required;Need further instruction   ? ?  ?  ? ?  ? ? ? PT Short Term Goals - 08/05/21 1217   ? ?  ? PT SHORT TERM GOAL #1  ? Title Pt will be independent with HEP in order to improve strength and balance in order to decrease fall risk and improve function at home and work.   ? Baseline 3/7: provided pt with initial HEP   ? Time 6   ? Period Weeks   ? Status New   ? Target Date 09/15/21   ? ?  ?  ? ?  ? ? ? ? PT Long Term Goals - 08/05/21 1217   ? ?  ? PT LONG TERM GOAL #1  ? Title Patient will increase FOTO score to equal to or greater than 52 to demonstrate statistically significant improvement in mobility and quality of life.   ? Baseline 3/7: 50   ? Time 12   ? Period Weeks   ? Status New   ? Target Date 10/27/21   ?  ? PT LONG TERM GOAL #2  ? Title Pt will improve BERG by at  least 3 points in order to demonstrate clinically significant improvement in balance.   ? Baseline 3/7: 40/56   ? Time 12   ? Period Weeks   ? Status  New   ? Target Date 10/27/21   ?  ? PT LONG TERM GOAL #3  ? Title Pt will decrease 5TSTS by at least 3 seconds in order to demonstrate clinically significant improvement in LE strength.   ? Baseline 3/7: 15.36 sec with use of BUEs to assist   ? Time 12   ? Period Weeks   ? Status New   ? Target Date 10/27/21   ?  ? PT LONG TERM GOAL #4  ? Title Pt will improve ABC by at least 13% in order to demonstrate clinically significant improvement in balance confidence.   ? Baseline 3/7: 50.63%   ? Time 12   ? Period Weeks   ? Status New   ? Target Date 10/27/21   ?  ? PT LONG TERM GOAL #5  ? Title Patient will increase 10 meter walk test to >1.23ms as to improve gait speed for better community ambulation and to reduce fall risk.   ? Baseline 3/7: 0.75 m/s with 4WW   ? Time 12   ? Period Weeks   ? Status New   ? Target Date 10/27/21   ? ?  ?  ? ?  ? ? ? ? ? ? ? ? Plan - 08/17/21 1523   ? ? Clinical Impression Statement Patient motivated and participated well within session. She responds well to flexibility exercise. Patient does require min VCs for proper exercise technique. she was able to exhibit better standing balance this session while on airex pad. She does require intermittent rail assist and cues for erect posture. Patient continues to exhibit increased RLE knee flexion in stance. She does fatigue quickly when stepping over obstacles requiring 2 HHA. She expressed desire to increase balance/strength as her grandson is getting married next month. Will progress HEP next session. She would benefit from additional skilled PT Intervention to improve strength, balance and mobility;   ? Personal Factors and Comorbidities Age;Past/Current Experience;Time since onset of injury/illness/exacerbation;Fitness;Comorbidity 3+   ? Comorbidities HTN, BTKA x1 R and 2xL, B THA x2,  diastolic heart failure, back pain, tachycardia   ? Examination-Activity Limitations Bend;Reach Overhead;Stairs;Transfers;Lift;Squat;Locomotion Level;Carry;Stand;Dressing   ? Examination-Participation Restric

## 2021-08-17 NOTE — Patient Instructions (Signed)

## 2021-08-17 NOTE — Therapy (Signed)
[facility] ?[place], [place], [address] ?Phone: [phone]   Fax:  [phone] ? ?Occupational Therapy Treatment ? ?Patient Details  ?Name: Mary [name] ?MRN: [id number] ?Date of Birth: September 18, 1940 ?Referring Provider (OT): Pershing Cox, MD ? ? ?Encounter Date: 08/17/2021 ? ? OT End of Session - 08/17/21 1427   ? ? Visit Number 6   ? Number of Visits 36   ? Date for OT Re-Evaluation 10/29/21   ? OT Start Time 0215   ? OT Stop Time 0323   ? OT Time Calculation (min) 68 min   ? Activity Tolerance Patient tolerated treatment well;No increased pain   ? Behavior During Therapy [facility] for tasks assessed/performed   ? ?  ?  ? ?  ? ? ?Past Medical History:  ?Diagnosis Date  ? Arrhythmia   ? Back pain   ? Depression with anxiety   ? Hip pain   ? Hyperlipidemia   ? Hypertension   ? Paroxysmal tachycardia ([place])   ? ? ?Past Surgical History:  ?Procedure Laterality Date  ? ABDOMINAL HYSTERECTOMY    ? BREAST BIOPSY    ? BREAST EXCISIONAL BIOPSY Left 2002  ? benign  ? CARDIAC CATHETERIZATION    ? 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  ? x2  ? TOTAL HIP ARTHROPLASTY Bilateral   ? done twice  ? ? ?There were no vitals filed for this visit. ? ? Subjective Assessment - 08/17/21 1429   ? ? Subjective  Mary [name] presents for OT Rx vist 6/36 to address BLE lymphedema. She denies LE-related leg pain this morning. Her daughter in law was supposed to attend our session today to learn compression wrapping, but did not shoe up.   ? Pertinent History HTN, B TKA x 2, B THA x2, Diastolic heart failure, back pain, tachycardia   ? Limitations difficulty walking, decreased standing tolerance, impaired transfers and functional mobility, impaired LB dressing, Limited L hip A/PROM, impaired dynamic balance, difficulty w/ LB dressing-fitting shoes, socks, pants   ? Repetition Increases Symptoms   ? Special Tests Intake FOTO score = 55/100; + [place]   ? Patient Stated Goals get leg swelling  under control; I want to be able to get out and walk   ? Pain Onset Other (comment)   ? ?  ?  ? ?  ? ? ? ? ? ? ? ? ? ? ? ? ? ? ? OT Treatments/Exercises (OP) - 08/17/21 1539   ? ?  ? ADLs  ? ADL Education Given Yes   ?  ? Manual Therapy  ? Manual Therapy Edema management;Manual Lymphatic Drainage (MLD);Compression Bandaging   ? Manual Lymphatic Drainage (MLD) MLD to LLE/LLQ MLD utilizing short neck sequence, functional L inguinal LN, and proximal to distal J strokes to thigh, knee, leg and foot. Good tolerance.   ? Compression Bandaging LLE multilayer knee length compression wraps using 1 each 8,10 and 12 cm wide short stretch bandages over a single layer of cotton stockinett and circumferentially applied single layer of 0.4 cm thick Rosidal foam.   ? ?  ?  ? ?  ? ? ? ? ? ? ? ? ? OT Education - 08/17/21 1541   ? ? Education Details Continued Pt/ CG edu for lymphedema self care  and home program throughout session. Topics include multilayer, gradient compression wrapping, simple self-MLD, therapeutic lymphatic pumping exercises, skin/nail care, risk reduction factors and LE precautions, compression garments/recommendations and wear and care  schedule and compression garment donning / doffing using assistive devices. All questions answered to the Pt's satisfaction. Pt demonstrates understanding by accurate return.   ? Person(s) Educated Patient   ? Methods Explanation;Demonstration;Handout   ? Comprehension Verbalized understanding;Returned demonstration;Need further instruction   ? ?  ?  ? ?  ? ? ? ? ? ? OT Long Term Goals - 07/31/21 1224   ? ?  ? OT LONG TERM GOAL #1  ? Title Given this patient?s low Intake score of 55/100 on the functional outcomes FOTO tool, patient will experience an increase in function of 2 points to improve basic and instrumental ADLs performance, including lymphedema self-care.   ? Baseline 55/100   ? Time 12   ? Period Weeks   ? Status New   ? Target Date 10/29/21   ?  ? OT LONG TERM GOAL #2   ? Title Pt will demonstrate understanding of lymphedema prevention strategies by identifying and discussing 5 precautions using printed reference (modified assistance) to reduce risk of progression and to limit infection risk.   ? Baseline Max A   ? Time 4   ? Period Days   ? Status New   ? Target Date --   4th OT visit  ?  ? OT LONG TERM GOAL #3  ? Title Pt will be able to apply knee length, multi-layer, short stretch compression wraps to one limb at a time using gradient techniques with MODIFIED INDEPENDENCE (extra time) to decrease limb volume, to limit infection risk, and to limit lymphedema progression.   ? Baseline Max A   ? Time 4   ? Period Days   ? Status New   ? Target Date --   4th OT visit  ?  ? OT LONG TERM GOAL #4  ? Title Pt will achieve at least a 10% limb volume reduction below the knee bilaterally to return limb to more normal size and shape, to limit infection risk, to decrease pain, to improve function, and to limit lymphedema progression.   ? Baseline Dependent   ? Time 12   ? Period Weeks   ? Status New   ? Target Date 10/29/21   ?  ? OT LONG TERM GOAL #5  ? Title Pt will achieve and sustain a least 85% compliance with all LE self-care home program components throughout Intensive Phase CDT, including daily skin inspection and care, lymphatic pumping ther ex, 23/7 compression wraps and simple self-MLD, to sustain clinical gains made in CDT and to limit lymphedema progression and further functional decline.   ? Baseline D   ? Time 12   ? Period Weeks   ? Status New   ? Target Date 10/29/21   ?  ? Long Term Additional Goals  ? Additional Long Term Goals Yes   ?  ? OT LONG TERM GOAL #6  ? Title Pt will be able to perform all lymphedema self-care home program components using correct techniques with extra time and assistive devices PRN (modified independence), including simple self MLD, lymphatic pumping exercise, don and doff appropriate compression garments/ devices with Max A, and perform daily  skin care regime to limit lymphedema progression and infection risk.   ? Baseline D   ? Time 12   ? Period Weeks   ? Status New   ? Target Date 10/29/21   ? ?  ?  ? ?  ? ? ? ? ? ? ? ? Plan - 08/17/21 1527   ? ?  Clinical Impression Statement Pt's family member did not attend appointment today as planned, so instead of teaching wrapping, as planned, we continued MLD , skin care and knee length compression wraps to LLE.Pt tolerated well without pain. Cont as per POC.   ? OT Occupational Profile and History Detailed Assessment- Review of Records and additional review of physical, cognitive, psychosocial history related to current functional performance   ? Occupational performance deficits (Please refer to evaluation for details): ADL's;IADL's;Rest and Sleep;Leisure;Social Participation;Work   ? Body Structure / Function / Physical Skills ADL;ROM;IADL;Edema;Balance;Skin integrity;Mobility;Flexibility;Decreased knowledge of precautions;Decreased knowledge of use of DME   ? Rehab Potential Good   ? Clinical Decision Making Several treatment options, min-mod task modification necessary   ? Comorbidities Affecting Occupational Performance: Presence of comorbidities impacting occupational performance   ? Modification or Assistance to Complete Evaluation  Min-Moderate modification of tasks or assist with assess necessary to complete eval   ? OT Frequency 2x / week   ? OT Duration 12 weeks   ? OT Treatment/Interventions Self-care/ADL training;DME and/or AE instruction;Manual lymph drainage;Compression bandaging;Therapeutic activities;Coping strategies training;Therapeutic exercise;Patient/family education;Manual Therapy   ? Consulted and Agree with Plan of Care Patient   ? ?  ?  ? ?  ? ? ?Patient will benefit from skilled therapeutic intervention in order to improve the following deficits and impairments:   ?Body Structure / Function / Physical Skills: ADL, ROM, IADL, Edema, Balance, Skin integrity, Mobility, Flexibility,  Decreased knowledge of precautions, Decreased knowledge of use of DME ?  ?  ? ? ?Visit Diagnosis: ?Lymphedema, not elsewhere classified ? ? ? ?Problem List ?Patient Active Problem List  ? Diagnosis Date Noted

## 2021-08-18 ENCOUNTER — Ambulatory Visit: Payer: Medicare Other | Admitting: Physical Therapy

## 2021-08-18 ENCOUNTER — Encounter: Payer: Medicare Other | Admitting: Occupational Therapy

## 2021-08-19 ENCOUNTER — Other Ambulatory Visit: Payer: Self-pay

## 2021-08-19 ENCOUNTER — Ambulatory Visit: Payer: Medicare Other | Admitting: Physical Therapy

## 2021-08-19 ENCOUNTER — Encounter: Payer: Self-pay | Admitting: Physical Therapy

## 2021-08-19 ENCOUNTER — Ambulatory Visit: Payer: Medicare Other | Admitting: Occupational Therapy

## 2021-08-19 DIAGNOSIS — R2681 Unsteadiness on feet: Secondary | ICD-10-CM

## 2021-08-19 DIAGNOSIS — R2689 Other abnormalities of gait and mobility: Secondary | ICD-10-CM

## 2021-08-19 DIAGNOSIS — M6281 Muscle weakness (generalized): Secondary | ICD-10-CM

## 2021-08-19 DIAGNOSIS — I890 Lymphedema, not elsewhere classified: Secondary | ICD-10-CM

## 2021-08-19 NOTE — Patient Instructions (Signed)
Access Code: [email or web address] ?URL: [email or web address] ?Date: 08/19/2021 ?Prepared by: Blanche East ? ?Exercises ?Seated March with Resistance - 1 x daily - 7 x weekly - 2 sets - 20 reps ?Seated Toe Raise - 1 x daily - 7 x weekly - 2 sets - 20 reps ?Sit to Stand with Counter Support - 1 x daily - 3 x weekly - 2 sets - 10 reps ?Standing Hip Abduction with Resistance at Ankles and Counter Support - 1 x daily - 3 x weekly - 3 sets - 10 reps ?Standing Hip Extension with Resistance at Ankles and Counter Support - 1 x daily - 3 x weekly - 3 sets - 10 reps ?Side Stepping with Resistance at Ankles and Counter Support - 1 x daily - 3 x weekly - 3 sets - 10 reps ?Standing Single Leg Stance with Counter Support - 1 x daily - 7 x weekly - 1 sets - 3 reps - 30 sec hold ? ?

## 2021-08-19 NOTE — Patient Instructions (Signed)

## 2021-08-19 NOTE — Therapy (Signed)
[place] ?[facility] ?[place], [place], [address] ?Phone: [phone]   Fax:  [phone] ? ?Physical Therapy Treatment ? ?Patient Details  ?Name: Mary [name] ?MRN: [id number] ?Date of Birth: 1941/02/26 ?Referring Provider (PT): Charlynne Cousins, MD ? ? ?Encounter Date: 08/19/2021 ? ? PT End of Session - 08/19/21 1153   ? ? Visit Number 5   ? Number of Visits 25   ? Date for PT Re-Evaluation 10/27/21   ? PT Start Time 1149   ? PT Stop Time 1230   ? PT Time Calculation (min) 41 min   ? Equipment Utilized During Treatment Gait belt   ? Activity Tolerance Patient tolerated treatment well   ? Behavior During Therapy [facility] for tasks assessed/performed   ? ?  ?  ? ?  ? ? ?Past Medical History:  ?Diagnosis Date  ? Arrhythmia   ? Back pain   ? Depression with anxiety   ? Hip pain   ? Hyperlipidemia   ? Hypertension   ? Paroxysmal tachycardia ([place])   ? ? ?Past Surgical History:  ?Procedure Laterality Date  ? ABDOMINAL HYSTERECTOMY    ? BREAST BIOPSY    ? BREAST EXCISIONAL BIOPSY Left 2002  ? benign  ? CARDIAC CATHETERIZATION    ? 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  ? x2  ? TOTAL HIP ARTHROPLASTY Bilateral   ? done twice  ? ? ?There were no vitals filed for this visit. ? ? Subjective Assessment - 08/19/21 1152   ? ? Subjective Patient reports doing well. No pain. No new changes; No new falls;   ? Pertinent History Pt is a pleasant 80 y/o female referred to PT for gait instability. Pt ambulates with RW. She reports onset of gait instability started several years ago. 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 Pt has had PT in past following surgeries. Pt reports hx of frequent falls. She has had 2 falls in past 6 months. Pt believes falls are due to difficulty picking up L foot and dragging L foot. Pt reports no injury with falls.  Pt being seen by OT for lymphedema, reports LLE more affected than  RLE. Pt currently lives alone. Her spouse passed away recently in 24-Jun-2022. Her adult son and DIL live nearby and can assist her. Pt denies any dizziness or lightheadedness. Pt reports no pain currently. 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   ? Limitations Lifting;Standing;Walking;House hold activities   ? How long can you sit comfortably? not limited   ? How long can you stand comfortably? Pt reports only limited due to need to stretch lower back   ? How long can you walk comfortably? At least 30 minutes   ? Diagnostic tests per chart recent imaging (February) of hip/knee unremarkable.   ? Patient Stated Goals Pt would like to be able to walk without a cane or at least not have to use her RW.   ? Currently in Pain? No/denies   ? ?  ?  ? ?  ? ? ? ? ? ?TREATMENT: ?Warm up on Nustep BUE/BLE level 1-3 (interval training), seat #8, arm #9 x4 min total with cues for steps per minute >80 to challenge cardiovascular endurance;  ?Vitals after exercise: SPO2 96%, HR 94, tolerated well;  ? ?Pt ambulated 15  feet with tripod base cane with CGA with reciprocal gait pattern, trendelenburg gait left;  ? ?In parallel bars: ?Instructed patient in advanced HEP: ?Standing with green tband: ?Hip abduction x10 reps each LE ?Patient exhibits decreased hip abduction with heavy lean on UE due to hip weakness ?PT reduced resistance to red tband x10 reps each LE with minimal ROM due to weakness but less compensation; ?Instructed patient in hip extension red tband x10 reps with cues to avoid trunk flexion for better hip strengthening;  ? ?Seated: ?Alternate DF red tband x20 reps each LE with cues to keep feet apart to increase resistance; ? ?Patient provided with written HEP for better adherence; ? ?PT also instructed patient in standing in parallel bars: ?SLS on firm surface with UE rail assist 30 sec hold x2 reps each LE, patient does exhibit trendelenburg when standing  on LLE, requiring cues to increase gluteal activation for better neutral hip positioning;  ? ?Patient does fatigue quickly with prolonged standing.  ?She verbalized understanding of HEP ?  ?  ?  ? ? ? ? ? ? ? ? ? ? ? ? ? ? ? ? ? ? ? ? ? ? ? PT Education - 08/19/21 1153   ? ? Education Details exercise/positioning, balance;   ? Person(s) Educated Patient   ? Methods Explanation;Verbal cues   ? Comprehension Verbalized understanding;Returned demonstration;Verbal cues required;Need further instruction   ? ?  ?  ? ?  ? ? ? PT Short Term Goals - 08/05/21 1217   ? ?  ? PT SHORT TERM GOAL #1  ? Title Pt will be independent with HEP in order to improve strength and balance in order to decrease fall risk and improve function at home and work.   ? Baseline 3/7: provided pt with initial HEP   ? Time 6   ? Period Weeks   ? Status New   ? Target Date 09/15/21   ? ?  ?  ? ?  ? ? ? ? PT Long Term Goals - 08/05/21 1217   ? ?  ? PT LONG TERM GOAL #1  ? Title Patient will increase FOTO score to equal to or greater than 52 to demonstrate statistically significant improvement in mobility and quality of life.   ? Baseline 3/7: 50   ? Time 12   ? Period Weeks   ? Status New   ? Target Date 10/27/21   ?  ? PT LONG TERM GOAL #2  ? Title Pt will improve BERG by at least 3 points in order to demonstrate clinically significant improvement in balance.   ? Baseline 3/7: 40/56   ? Time 12   ? Period Weeks   ? Status New   ? Target Date 10/27/21   ?  ? PT LONG TERM GOAL #3  ? Title Pt will decrease 5TSTS by at least 3 seconds in order to demonstrate clinically significant improvement in LE strength.   ? Baseline 3/7: 15.36 sec with use of BUEs to assist   ? Time 12   ? Period Weeks   ? Status New   ? Target Date 10/27/21   ?  ? PT LONG TERM GOAL #4  ? Title Pt will improve ABC by at least 13% in order to demonstrate clinically significant improvement in balance confidence.   ? Baseline 3/7: 50.63%   ? Time 12   ? Period Weeks   ? Status New   ?  Target Date 10/27/21   ?  ?  PT LONG TERM GOAL #5  ? Title Patient will increase 10 meter walk test to >1.51ms as to improve gait speed for better community ambulation and to reduce fall risk.   ? Baseline 3/7: 0.75 m/s with 4WW   ? Time 12   ? Period Weeks   ? Status New   ? Target Date 10/27/21   ? ?  ?  ? ?  ? ? ? ? ? ? ? ? Plan - 08/19/21 2129   ? ? Clinical Impression Statement Patient motivated and participated well within session. She was instructed in advanced LE strengthening as part of HEP. Patient was able to tolerate resistance band well. She does exhibit significant weakness in BLE hip especially hip abduction requiring cues for proper exercise technique and positioning. Patient also instructed in SLS with UE support requiring cues for increased gluteal activation to avoid hip trendelenburg. Patient's LLE hip is weaker than RLE. She is able to walk short distances with tripod base cane but does exhibit trendelenburg gait. She would benefit from additional skilled PT Intervention to improve strength, balance and mobility;   ? Personal Factors and Comorbidities Age;Past/Current Experience;Time since onset of injury/illness/exacerbation;Fitness;Comorbidity 3+   ? 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 Examination-Activity Limitations Bend;Reach Overhead;Stairs;Transfers;Lift;Squat;Locomotion Level;Carry;Stand;Dressing   ? Examination-Participation Restrictions Volunteer;Community Activity;Shop;Cleaning;Laundry;YValla LeaverWork   ? Stability/Clinical Decision Making Evolving/Moderate complexity   ? Rehab Potential Good   ? PT Frequency 2x / week   ? PT Duration 12 weeks   ? PT Treatment/Interventions ADLs/Self Care Home Management;Aquatic Therapy;Biofeedback;Canalith Repostioning;Cryotherapy;Electrical Stimulation;Moist Heat;Traction;Ultrasound;DME Instruction;Gait training;Functional mBiochemist, clinicalTherapeutic activities;Therapeutic  exercise;Balance training;Neuromuscular re-education;Patient/family education;Orthotic Fit/Training;Wheelchair mobility training;Manual techniques;Passive range of motion;Scar mobilization;Dry needling;Ener

## 2021-08-19 NOTE — Therapy (Signed)
[place] ?[facility] ?[place], [place], [address] ?Phone: [phone]   Fax:  [phone] ? ?Occupational Therapy Treatment ? ?Patient Details  ?Name: Mary [name] ?MRN: [id number] ?Date of Birth: 01/24/1941 ?Referring Provider (OT): Pershing Cox, MD ? ? ?Encounter Date: 08/19/2021 ? ? OT End of Session - 08/19/21 1246   ? ? Visit Number 7   ? Number of Visits 36   ? Date for OT Re-Evaluation 10/29/21   ? OT Start Time 0100   ? OT Stop Time 0215   ? OT Time Calculation (min) 75 min   ? Activity Tolerance Patient tolerated treatment well;No increased pain   ? Behavior During Therapy [facility] for tasks assessed/performed   ? ?  ?  ? ?  ? ? ?Past Medical History:  ?Diagnosis Date  ? Arrhythmia   ? Back pain   ? Depression with anxiety   ? Hip pain   ? Hyperlipidemia   ? Hypertension   ? Paroxysmal tachycardia ([place])   ? ? ?Past Surgical History:  ?Procedure Laterality Date  ? ABDOMINAL HYSTERECTOMY    ? BREAST BIOPSY    ? BREAST EXCISIONAL BIOPSY Left 2002  ? benign  ? CARDIAC CATHETERIZATION    ? 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  ? x2  ? TOTAL HIP ARTHROPLASTY Bilateral   ? done twice  ? ? ?There were no vitals filed for this visit. ? ? Subjective Assessment - 08/19/21 1247   ? ? Subjective  Mary [name] presents for OT Rx vist 7/36 to address BLE lymphedema. She denies LE-related leg pain this morning. Pt reports she had PT earlier today and received new lower body exercises.   ? Pertinent History HTN, B TKA x 2, B THA x2, Diastolic heart failure, back pain, tachycardia   ? Limitations difficulty walking, decreased standing tolerance, impaired transfers and functional mobility, impaired LB dressing, Limited L hip A/PROM, impaired dynamic balance, difficulty w/ LB dressing-fitting shoes, socks, pants   ? Repetition Increases Symptoms   ? Special Tests Intake FOTO score = 55/100; + [place]   ? Patient Stated Goals get leg swelling under control; I want to be able to get  out and walk   ? Pain Onset Other (comment)   ? ?  ?  ? ?  ? ? ? ? ? ? ? ? ? ? ? ? ? ? ? OT Treatments/Exercises (OP) - 08/19/21 1248   ? ?  ? ADLs  ? ADL Education Given Yes   ?  ? Manual Therapy  ? Manual Therapy Edema management;Manual Lymphatic Drainage (MLD);Compression Bandaging   ? Manual Lymphatic Drainage (MLD) MLD to LLE/LLQ MLD utilizing short neck sequence, functional L inguinal LN, and proximal to distal J strokes to thigh, knee, leg and foot. Good tolerance.   ? Compression Bandaging LLE multilayer knee length compression wraps using 1 each 8,10 and 12 cm wide short stretch bandages over a single layer of cotton stockinett and circumferentially applied single layer of 0.4 cm thick Rosidal foam.   ? ?  ?  ? ?  ? ? ? ? ? ? ? ? ? OT Education - 08/19/21 1249   ? ? Education Details Continued Pt/ CG edu for lymphedema self care  and home program throughout session. Topics include multilayer, gradient compression wrapping, simple self-MLD, therapeutic lymphatic pumping exercises, skin/nail care, risk reduction factors and LE precautions, compression garments/recommendations and wear and care schedule and compression garment donning / doffing  using assistive devices. All questions answered to the Pt's satisfaction. Pt demonstrates understanding by accurate return.   ? Person(s) Educated Patient   ? Methods Explanation;Demonstration;Handout   ? Comprehension Verbalized understanding;Returned demonstration;Need further instruction   ? ?  ?  ? ?  ? ? ? ? ? ? OT Long Term Goals - 07/31/21 1224   ? ?  ? OT LONG TERM GOAL #1  ? Title Given this patient?s low Intake score of 55/100 on the functional outcomes FOTO tool, patient will experience an increase in function of 2 points to improve basic and instrumental ADLs performance, including lymphedema self-care.   ? Baseline 55/100   ? Time 12   ? Period Weeks   ? Status New   ? Target Date 10/29/21   ?  ? OT LONG TERM GOAL #2  ? Title Pt will demonstrate  understanding of lymphedema prevention strategies by identifying and discussing 5 precautions using printed reference (modified assistance) to reduce risk of progression and to limit infection risk.   ? Baseline Max A   ? Time 4   ? Period Days   ? Status New   ? Target Date --   4th OT visit  ?  ? OT LONG TERM GOAL #3  ? Title Pt will be able to apply knee length, multi-layer, short stretch compression wraps to one limb at a time using gradient techniques with MODIFIED INDEPENDENCE (extra time) to decrease limb volume, to limit infection risk, and to limit lymphedema progression.   ? Baseline Max A   ? Time 4   ? Period Days   ? Status New   ? Target Date --   4th OT visit  ?  ? OT LONG TERM GOAL #4  ? Title Pt will achieve at least a 10% limb volume reduction below the knee bilaterally to return limb to more normal size and shape, to limit infection risk, to decrease pain, to improve function, and to limit lymphedema progression.   ? Baseline Dependent   ? Time 12   ? Period Weeks   ? Status New   ? Target Date 10/29/21   ?  ? OT LONG TERM GOAL #5  ? Title Pt will achieve and sustain a least 85% compliance with all LE self-care home program components throughout Intensive Phase CDT, including daily skin inspection and care, lymphatic pumping ther ex, 23/7 compression wraps and simple self-MLD, to sustain clinical gains made in CDT and to limit lymphedema progression and further functional decline.   ? Baseline D   ? Time 12   ? Period Weeks   ? Status New   ? Target Date 10/29/21   ?  ? Long Term Additional Goals  ? Additional Long Term Goals Yes   ?  ? OT LONG TERM GOAL #6  ? Title Pt will be able to perform all lymphedema self-care home program components using correct techniques with extra time and assistive devices PRN (modified independence), including simple self MLD, lymphatic pumping exercise, don and doff appropriate compression garments/ devices with Max A, and perform daily skin care regime to limit  lymphedema progression and infection risk.   ? Baseline D   ? Time 12   ? Period Weeks   ? Status New   ? Target Date 10/29/21   ? ?  ?  ? ?  ? ? ? ? ? ? ? ? Plan - 08/19/21 1249   ? ? Clinical Impression Statement Pt and  family CG training for gradient compression wrapping. Cont as per POC.   ? OT Occupational Profile and History Detailed Assessment- Review of Records and additional review of physical, cognitive, psychosocial history related to current functional performance   ? Occupational performance deficits (Please refer to evaluation for details): ADL's;IADL's;Rest and Sleep;Leisure;Social Participation;Work   ? Body Structure / Function / Physical Skills ADL;ROM;IADL;Edema;Balance;Skin integrity;Mobility;Flexibility;Decreased knowledge of precautions;Decreased knowledge of use of DME   ? Rehab Potential Good   ? Clinical Decision Making Several treatment options, min-mod task modification necessary   ? Comorbidities Affecting Occupational Performance: Presence of comorbidities impacting occupational performance   ? Modification or Assistance to Complete Evaluation  Min-Moderate modification of tasks or assist with assess necessary to complete eval   ? OT Frequency 2x / week   ? OT Duration 12 weeks   ? OT Treatment/Interventions Self-care/ADL training;DME and/or AE instruction;Manual lymph drainage;Compression bandaging;Therapeutic activities;Coping strategies training;Therapeutic exercise;Patient/family education;Manual Therapy   ? Consulted and Agree with Plan of Care Patient   ? ?  ?  ? ?  ? ? ?Patient will benefit from skilled therapeutic intervention in order to improve the following deficits and impairments:   ?Body Structure / Function / Physical Skills: ADL, ROM, IADL, Edema, Balance, Skin integrity, Mobility, Flexibility, Decreased knowledge of precautions, Decreased knowledge of use of DME ?  ?  ? ? ?Visit Diagnosis: ?Lymphedema, not elsewhere classified ? ? ? ?Problem List ?Patient Active Problem  List  ? Diagnosis Date Noted  ? Diastolic heart failure ([place]) 07/30/2021  ? Hyperlipidemia 07/30/2021  ? Lymphedema 07/30/2021  ? Acute pain of left knee 07/02/2021  ? Mammogram abnormal 07/02/2021  ? Tachycardia 01/

## 2021-08-20 ENCOUNTER — Telehealth: Payer: Self-pay | Admitting: Family Medicine

## 2021-08-20 ENCOUNTER — Ambulatory Visit: Payer: Medicare Other | Admitting: Physical Therapy

## 2021-08-21 ENCOUNTER — Encounter: Payer: Medicare Other | Admitting: Occupational Therapy

## 2021-08-21 NOTE — Telephone Encounter (Signed)
Mary [name] (Patient) Mary [name] (Patient) Appointment Scheduling - New Patient  Reason for CRM: pt called in for assistance. Pt says that her late husband was a pt of Mikey Kirschner. Pt prefer a MD. Pt would like to know if Dr. B would take her on as a new patient? Pt says that her current PCP Dr. Neomia Dear is leaving the office. Please advise further   Pt advised.

## 2021-08-21 NOTE — Telephone Encounter (Signed)
See CRM regarding establishing care. I am not currently taking on new patients.  She is welcome to establish with any of our providers that are accepting new patients at this time [name], [name], [name]).  Dr Rosana Berger at [facility] is accepting new patients if she would like a physician. ?

## 2021-08-24 ENCOUNTER — Other Ambulatory Visit: Payer: Self-pay

## 2021-08-24 ENCOUNTER — Ambulatory Visit: Payer: Medicare Other | Admitting: Occupational Therapy

## 2021-08-24 DIAGNOSIS — I890 Lymphedema, not elsewhere classified: Secondary | ICD-10-CM | POA: Diagnosis not present

## 2021-08-25 ENCOUNTER — Ambulatory Visit: Payer: Medicare Other

## 2021-08-25 ENCOUNTER — Ambulatory Visit: Payer: Medicare Other | Admitting: Occupational Therapy

## 2021-08-25 DIAGNOSIS — I890 Lymphedema, not elsewhere classified: Secondary | ICD-10-CM | POA: Diagnosis not present

## 2021-08-25 DIAGNOSIS — M6281 Muscle weakness (generalized): Secondary | ICD-10-CM

## 2021-08-25 DIAGNOSIS — R2681 Unsteadiness on feet: Secondary | ICD-10-CM

## 2021-08-25 DIAGNOSIS — R2689 Other abnormalities of gait and mobility: Secondary | ICD-10-CM

## 2021-08-25 NOTE — Therapy (Signed)
[facility] ?[place], [place], [address] ?Phone: [phone]   Fax:  [phone] ? ?Physical Therapy Treatment ? ?Patient Details  ?Name: Mary [name] ?MRN: [id number] ?Date of Birth: 1940/07/08 ?Referring Provider (PT): Charlynne Cousins, MD ? ? ?Encounter Date: 08/25/2021 ? ? PT End of Session - 08/25/21 1606   ? ? Visit Number 6   ? Number of Visits 25   ? Date for PT Re-Evaluation 10/27/21   ? PT Start Time 1615   ? PT Stop Time 1700   ? PT Time Calculation (min) 45 min   ? Equipment Utilized During Treatment Gait belt   ? Activity Tolerance Patient tolerated treatment well   ? Behavior During Therapy [facility] for tasks assessed/performed   ? ?  ?  ? ?  ? ? ?Past Medical History:  ?Diagnosis Date  ? Arrhythmia   ? Back pain   ? Depression with anxiety   ? Hip pain   ? Hyperlipidemia   ? Hypertension   ? Paroxysmal tachycardia ([place])   ? ? ?Past Surgical History:  ?Procedure Laterality Date  ? ABDOMINAL HYSTERECTOMY    ? BREAST BIOPSY    ? BREAST EXCISIONAL BIOPSY Left 2002  ? benign  ? CARDIAC CATHETERIZATION    ? 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  ? x2  ? TOTAL HIP ARTHROPLASTY Bilateral   ? done twice  ? ? ?There were no vitals filed for this visit. ? ? Subjective Assessment - 08/25/21 1710   ? ? Subjective Patient came to PT from lymphedema therapy. Patient reports no falls, no pain.   ? Pertinent History Pt is a pleasant 81 y/o female referred to PT for gait instability. Pt ambulates with RW. She reports onset of gait instability started several years ago. 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 Pt has had PT in past following surgeries. Pt reports hx of frequent falls. She has had 2 falls in past 6 months. Pt believes falls are due to difficulty picking up L foot and dragging L foot. Pt reports no injury with falls.  Pt being seen by OT for lymphedema, reports LLE more  affected than RLE. Pt currently lives alone. Her spouse passed away recently in 2022/07/11. Her adult son and DIL live nearby and can assist her. Pt denies any dizziness or lightheadedness. Pt reports no pain currently. 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   ? Limitations Lifting;Standing;Walking;House hold activities   ? How long can you sit comfortably? not limited   ? How long can you stand comfortably? Pt reports only limited due to need to stretch lower back   ? How long can you walk comfortably? At least 30 minutes   ? Diagnostic tests per chart recent imaging (February) of hip/knee unremarkable.   ? Patient Stated Goals Pt would like to be able to walk without a cane or at least not have to use her RW.   ? Currently in Pain? No/denies   ? ?  ?  ? ?  ? ? ? ? ? ? ? ? ? ?TREATMENT: ? ?Neuro Re-ed ? ?Standing with CGA next to support surface:  ?Airex pad: static stand 30 seconds x 2 trials, noticeable trembling of ankles/LE's with fatigue and challenge to maintain stability ?Airex pad: horizontal head turns  30 seconds scanning room 10x ; cueing for arc of motion  ?Airex pad: vertical head turns 30 seconds, cueing for arc of motion, noticeable sway with upward gaze increasing demand on ankle righting reaction musculature ?Airex pad: one foot on 6" step one foot on airex pad, hold position for 30 seconds, switch legs, 2x each LE; ? ?Speed ladder: one foot each square; initially with BUE support cues for heel strike and step length; decreased to SUE with other hand finger tip support x12 length ? ?Walk with female PT arm hold with additional hand hold on same side to mimic walking down aisle 4x 30 ft; decreasing assistance required.  ? ? ? ?TherEx ? ?6" step toe taps with SUE support 10x each LE  ?Hip flexion into GTB across // bars 12x each LE  ?YTB across bilateral feet: ?-lateral stepping 4x length of // bars  ?SUE support forward/backwards step  10x each LE ? ?Seated: ?GTB hamstring curl 12x each LE ?GTB adduction 12x each LE ? ? ? ?Pt educated throughout session about proper posture and technique with exercises. Improved exercise technique, movement at target joints, use of target muscles after min to mod verbal, visual, tactile cues. ? ? ?Patient simulated walking down aisle with female therapist and PT behind patient with CGA. Patient tolerated ambulation with elbow lock and hand grasp. [name] support strengthening interventions progressed with patient challenged but able to perform with rest breaks. Ankle righting reactions are limited with unstable surfaces and head movements indicating area of continued focus. She would benefit from additional skilled PT Intervention to improve strength, balance and mobility; ? ? ? ? ? ? ? ? ? ? ? ? PT Education - 08/25/21 1606   ? ? Education Details exercise technique, body mechanics   ? Person(s) Educated Patient   ? Methods Explanation;Demonstration;Tactile cues;Verbal cues   ? Comprehension Verbalized understanding;Returned demonstration;Verbal cues required;Tactile cues required   ? ?  ?  ? ?  ? ? ? PT Short Term Goals - 08/05/21 1217   ? ?  ? PT SHORT TERM GOAL #1  ? Title Pt will be independent with HEP in order to improve strength and balance in order to decrease fall risk and improve function at home and work.   ? Baseline 3/7: provided pt with initial HEP   ? Time 6   ? Period Weeks   ? Status New   ? Target Date 09/15/21   ? ?  ?  ? ?  ? ? ? ? PT Long Term Goals - 08/05/21 1217   ? ?  ? PT LONG TERM GOAL #1  ? Title Patient will increase FOTO score to equal to or greater than 52 to demonstrate statistically significant improvement in mobility and quality of life.   ? Baseline 3/7: 50   ? Time 12   ? Period Weeks   ? Status New   ? Target Date 10/27/21   ?  ? PT LONG TERM GOAL #2  ? Title Pt will improve BERG by at least 3 points in order to demonstrate clinically significant improvement in balance.   ? Baseline  3/7: 40/56   ? Time 12   ? Period Weeks   ? Status New   ? Target Date 10/27/21   ?  ? PT LONG TERM GOAL #3  ? Title Pt will decrease 5TSTS by at least 3 seconds in order to demonstrate clinically significant improvement in LE strength.   ? Baseline 3/7: 15.36  sec with use of BUEs to assist   ? Time 12   ? Period Weeks   ? Status New   ? Target Date 10/27/21   ?  ? PT LONG TERM GOAL #4  ? Title Pt will improve ABC by at least 13% in order to demonstrate clinically significant improvement in balance confidence.   ? Baseline 3/7: 50.63%   ? Time 12   ? Period Weeks   ? Status New   ? Target Date 10/27/21   ?  ? PT LONG TERM GOAL #5  ? Title Patient will increase 10 meter walk test to >1.37ms as to improve gait speed for better community ambulation and to reduce fall risk.   ? Baseline 3/7: 0.75 m/s with 4WW   ? Time 12   ? Period Weeks   ? Status New   ? Target Date 10/27/21   ? ?  ?  ? ?  ? ? ? ? ? ? ? ? Plan - 08/25/21 1719   ? ? Clinical Impression Statement Patient simulated walking down aisle with female therapist and PT behind patient with CGA. Patient tolerated ambulation with elbow lock and hand grasp. SCollie Siadsupport strengthening interventions progressed with patient challenged but able to perform with rest breaks. Ankle righting reactions are limited with unstable surfaces and head movements indicating area of continued focus. She would benefit from additional skilled PT Intervention to improve strength, balance and mobility.   ? Personal Factors and Comorbidities Age;Past/Current Experience;Time since onset of injury/illness/exacerbation;Fitness;Comorbidity 3+   ? 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 Examination-Activity Limitations Bend;Reach Overhead;Stairs;Transfers;Lift;Squat;Locomotion Level;Carry;Stand;Dressing   ? Examination-Participation Restrictions Volunteer;Community Activity;Shop;Cleaning;Laundry;YValla LeaverWork   ? Stability/Clinical Decision  Making Evolving/Moderate complexity   ? Rehab Potential Good   ? PT Frequency 2x / week   ? PT Duration 12 weeks   ? PT Treatment/Interventions ADLs/Self Care Home Management;Aquatic Therapy;Biofeedback;

## 2021-08-26 ENCOUNTER — Encounter: Payer: Medicare Other | Admitting: Occupational Therapy

## 2021-08-26 NOTE — Therapy (Signed)
[facility] ?[place], [place], [address] ?Phone: [phone]   Fax:  [phone] ? ?Occupational Therapy Treatment ? ?Patient Details  ?Name: Mary [name] ?MRN: [id number] ?Date of Birth: [birth year] ?Referring Provider (OT): Pershing Cox, MD ? ? ?Encounter Date: 08/25/2021 ? ? OT End of Session - 08/26/21 1258   ? ? Visit Number 8   ? Number of Visits 36   ? Date for OT Re-Evaluation 10/29/21   ? OT Start Time [phone]   ? OT Stop Time 0406   ? OT Time Calculation (min) 60 min   ? Activity Tolerance Patient tolerated treatment well;No increased pain   ? Behavior During Therapy [facility] for tasks assessed/performed   ? ?  ?  ? ?  ? ? ?Past Medical History:  ?Diagnosis Date  ? Arrhythmia   ? Back pain   ? Depression with anxiety   ? Hip pain   ? Hyperlipidemia   ? Hypertension   ? Paroxysmal tachycardia ([place])   ? ? ?Past Surgical History:  ?Procedure Laterality Date  ? ABDOMINAL HYSTERECTOMY    ? BREAST BIOPSY    ? BREAST EXCISIONAL BIOPSY Left 2002  ? benign  ? CARDIAC CATHETERIZATION    ? 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  ? x2  ? TOTAL HIP ARTHROPLASTY Bilateral   ? done twice  ? ? ?There were no vitals filed for this visit. ? ? Subjective Assessment - 08/26/21 1253   ? ? Subjective  Mary [name] presents for OT Rx vist 9/36 to address BLE lymphedema. She denies LE-related leg pain. Pt reports she tolerated compression wraps all night and took them off about 45 minutes before her session.   ? Pertinent History HTN, B TKA x 2, B THA x2, Diastolic heart failure, back pain, tachycardia   ? Limitations difficulty walking, decreased standing tolerance, impaired transfers and functional mobility, impaired LB dressing, Limited L hip A/PROM, impaired dynamic balance, difficulty w/ LB dressing-fitting shoes, socks, pants   ? Repetition Increases Symptoms   ? Special Tests Intake FOTO score = 55/100; + [place]   ? Patient Stated Goals get leg swelling under control; I want  to be able to get out and walk   ? Pain Onset Other (comment)   ? ?  ?  ? ?  ? ? ? ? ? ? ? ? ? ? ? ? ? ? ? OT Treatments/Exercises (OP) - 08/26/21 1254   ? ?  ? ADLs  ? ADL Education Given Yes   ?  ? Manual Therapy  ? Manual Therapy Edema management;Manual Lymphatic Drainage (MLD);Compression Bandaging   ? Manual Lymphatic Drainage (MLD) MLD to LLE/LLQ MLD utilizing short neck sequence, functional L inguinal LN, and proximal to distal J strokes to thigh, knee, leg and foot. Good tolerance.   ? Compression Bandaging LLE multilayer knee length compression wraps using 1 each 8,10 and 12 cm wide short stretch bandages over a single layer of cotton stockinett and circumferentially applied single layer of 0.4 cm thick Rosidal foam.   ? ?  ?  ? ?  ? ? ? ? ? ? ? ? ? OT Education - 08/26/21 1255   ? ? Education Details Continued Pt/ CG edu for lymphedema self care  and home program throughout session. Topics include multilayer, gradient compression wrapping, simple self-MLD, therapeutic lymphatic pumping exercises, skin/nail care, risk reduction factors and LE precautions, compression garments/recommendations and wear and care schedule and compression garment  donning / doffing using assistive devices. All questions answered to the Pt's satisfaction. Pt demonstrates understanding by accurate return.   ? Person(s) Educated Patient   ? Methods Explanation;Demonstration;Handout   ? Comprehension Verbalized understanding;Returned demonstration;Need further instruction   ? ?  ?  ? ?  ? ? ? ? ? ? OT Long Term Goals - 07/31/21 1224   ? ?  ? OT LONG TERM GOAL #1  ? Title Given this patient?s low Intake score of 55/100 on the functional outcomes FOTO tool, patient will experience an increase in function of 2 points to improve basic and instrumental ADLs performance, including lymphedema self-care.   ? Baseline 55/100   ? Time 12   ? Period Weeks   ? Status New   ? Target Date 10/29/21   ?  ? OT LONG TERM GOAL #2  ? Title Pt will  demonstrate understanding of lymphedema prevention strategies by identifying and discussing 5 precautions using printed reference (modified assistance) to reduce risk of progression and to limit infection risk.   ? Baseline Max A   ? Time 4   ? Period Days   ? Status New   ? Target Date --   4th OT visit  ?  ? OT LONG TERM GOAL #3  ? Title Pt will be able to apply knee length, multi-layer, short stretch compression wraps to one limb at a time using gradient techniques with MODIFIED INDEPENDENCE (extra time) to decrease limb volume, to limit infection risk, and to limit lymphedema progression.   ? Baseline Max A   ? Time 4   ? Period Days   ? Status New   ? Target Date --   4th OT visit  ?  ? OT LONG TERM GOAL #4  ? Title Pt will achieve at least a 10% limb volume reduction below the knee bilaterally to return limb to more normal size and shape, to limit infection risk, to decrease pain, to improve function, and to limit lymphedema progression.   ? Baseline Dependent   ? Time 12   ? Period Weeks   ? Status New   ? Target Date 10/29/21   ?  ? OT LONG TERM GOAL #5  ? Title Pt will achieve and sustain a least 85% compliance with all LE self-care home program components throughout Intensive Phase CDT, including daily skin inspection and care, lymphatic pumping ther ex, 23/7 compression wraps and simple self-MLD, to sustain clinical gains made in CDT and to limit lymphedema progression and further functional decline.   ? Baseline D   ? Time 12   ? Period Weeks   ? Status New   ? Target Date 10/29/21   ?  ? Long Term Additional Goals  ? Additional Long Term Goals Yes   ?  ? OT LONG TERM GOAL #6  ? Title Pt will be able to perform all lymphedema self-care home program components using correct techniques with extra time and assistive devices PRN (modified independence), including simple self MLD, lymphatic pumping exercise, don and doff appropriate compression garments/ devices with Max A, and perform daily skin care regime  to limit lymphedema progression and infection risk.   ? Baseline D   ? Time 12   ? Period Weeks   ? Status New   ? Target Date 10/29/21   ? ?  ?  ? ?  ? ? ? ? ? ? ? ? Plan - 08/26/21 1253   ? ? Clinical Impression  Statement Continued MLD to RLE/RLQ as established.Pt tolerated well without pain. Applied wraps as establlished. Pt's questions answered throughout session to her satisfaction. She is working on arranging for assistance between  visits for compression wrapping. This has not happened yet, and consequently limb volume is not reduced  to date. Cont as per POC.   ? OT Occupational Profile and History Detailed Assessment- Review of Records and additional review of physical, cognitive, psychosocial history related to current functional performance   ? Occupational performance deficits (Please refer to evaluation for details): ADL's;IADL's;Rest and Sleep;Leisure;Social Participation;Work   ? Body Structure / Function / Physical Skills ADL;ROM;IADL;Edema;Balance;Skin integrity;Mobility;Flexibility;Decreased knowledge of precautions;Decreased knowledge of use of DME   ? Rehab Potential Good   ? Clinical Decision Making Several treatment options, min-mod task modification necessary   ? Comorbidities Affecting Occupational Performance: Presence of comorbidities impacting occupational performance   ? Modification or Assistance to Complete Evaluation  Min-Moderate modification of tasks or assist with assess necessary to complete eval   ? OT Frequency 2x / week   ? OT Duration 12 weeks   ? OT Treatment/Interventions Self-care/ADL training;DME and/or AE instruction;Manual lymph drainage;Compression bandaging;Therapeutic activities;Coping strategies training;Therapeutic exercise;Patient/family education;Manual Therapy   ? Consulted and Agree with Plan of Care Patient   ? ?  ?  ? ?  ? ? ?Patient will benefit from skilled therapeutic intervention in order to improve the following deficits and impairments:   ?Body Structure  / Function / Physical Skills: ADL, ROM, IADL, Edema, Balance, Skin integrity, Mobility, Flexibility, Decreased knowledge of precautions, Decreased knowledge of use of DME ?  ?  ? ? ?Visit Diagnosis: ?Lymph

## 2021-08-26 NOTE — Therapy (Deleted)
[facility] MAIN [facility] SERVICES [address] [place], [place], [address] Phone: [phone]   Fax:  [phone]  Occupational Therapy Treatment  Patient Details  Name: Mary [name] MRN: [id number] Date of Birth: 1941/01/01 Referring Provider (OT): Jill Side, MD   Encounter Date: 08/25/2021   OT End of Session - 08/26/21 1258     Visit Number 8    Number of Visits 36    Date for OT Re-Evaluation 10/29/21    OT Start Time 0306    OT Stop Time 0406    OT Time Calculation (min) 60 min    Activity Tolerance Patient tolerated treatment well;No increased pain    Behavior During Therapy WFL for tasks assessed/performed             Past Medical History:  Diagnosis Date   Arrhythmia    Back pain    Depression with anxiety    Hip pain    Hyperlipidemia    Hypertension    Paroxysmal tachycardia (HCC)     Past Surgical History:  Procedure Laterality Date   ABDOMINAL HYSTERECTOMY     BREAST BIOPSY     BREAST EXCISIONAL BIOPSY Left 2002   benign   CARDIAC CATHETERIZATION     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   x2   TOTAL HIP ARTHROPLASTY Bilateral    done twice    There were no vitals filed for this visit.   Subjective Assessment - 08/26/21 1253     Subjective  Mary [name] presents for OT Rx vist 9/36 to address BLE lymphedema. She denies LE-related leg pain. Pt reports she tolerated compression wraps all night and took them off about 45 minutes before her session.    Pertinent History HTN, B TKA x 2, B THA x2, Diastolic heart failure, back pain, tachycardia    Limitations difficulty walking, decreased standing tolerance, impaired transfers and functional mobility, impaired LB dressing, Limited L hip A/PROM, impaired dynamic balance, difficulty w/ LB dressing-fitting shoes, socks, pants    Repetition Increases Symptoms    Special Tests Intake FOTO score = 55/100; + Stemmer    Patient Stated Goals get leg swelling under control; I want  to be able to get out and walk    Pain Onset Other (comment)                          OT Treatments/Exercises (OP) - 08/26/21 1254       ADLs   ADL Education Given Yes      Manual Therapy   Manual Therapy Edema management;Manual Lymphatic Drainage (MLD);Compression Bandaging    Manual Lymphatic Drainage (MLD) MLD to LLE/LLQ MLD utilizing short neck sequence, functional L inguinal LN, and proximal to distal J strokes to thigh, knee, leg and foot. Good tolerance.    Compression Bandaging LLE multilayer knee length compression wraps using 1 each 8,10 and 12 cm wide short stretch bandages over a single layer of cotton stockinett and circumferentially applied single layer of 0.4 cm thick Rosidal foam.                    OT Education - 08/26/21 1255     Education Details Continued Pt/ CG edu for lymphedema self care  and home program throughout session. Topics include multilayer, gradient compression wrapping, simple self-MLD, therapeutic lymphatic pumping exercises, skin/nail care, risk reduction factors and LE precautions, compression garments/recommendations and wear and care schedule and compression garment  donning / doffing using assistive devices. All questions answered to the Pt's satisfaction. Pt demonstrates understanding by accurate return.    Person(s) Educated Patient    Methods Explanation;Demonstration;Handout    Comprehension Verbalized understanding;Returned demonstration;Need further instruction                 OT Long Term Goals - 07/31/21 1224       OT LONG TERM GOAL #1   Title Given this patient's low Intake score of 55/100 on the functional outcomes FOTO tool, patient will experience an increase in function of 2 points to improve basic and instrumental ADLs performance, including lymphedema self-care.    Baseline 55/100    Time 12    Period Weeks    Status New    Target Date 10/29/21      OT LONG TERM GOAL #2   Title Pt will  demonstrate understanding of lymphedema prevention strategies by identifying and discussing 5 precautions using printed reference (modified assistance) to reduce risk of progression and to limit infection risk.    Baseline Max A    Time 4    Period Days    Status New    Target Date --   4th OT visit     OT LONG TERM GOAL #3   Title Pt will be able to apply knee length, multi-layer, short stretch compression wraps to one limb at a time using gradient techniques with MODIFIED INDEPENDENCE (extra time) to decrease limb volume, to limit infection risk, and to limit lymphedema progression.    Baseline Max A    Time 4    Period Days    Status New    Target Date --   4th OT visit     OT LONG TERM GOAL #4   Title Pt will achieve at least a 10% limb volume reduction below the knee bilaterally to return limb to more normal size and shape, to limit infection risk, to decrease pain, to improve function, and to limit lymphedema progression.    Baseline Dependent    Time 12    Period Weeks    Status New    Target Date 10/29/21      OT LONG TERM GOAL #5   Title Pt will achieve and sustain a least 85% compliance with all LE self-care home program components throughout Intensive Phase CDT, including daily skin inspection and care, lymphatic pumping ther ex, 23/7 compression wraps and simple self-MLD, to sustain clinical gains made in CDT and to limit lymphedema progression and further functional decline.    Baseline D    Time 12    Period Weeks    Status New    Target Date 10/29/21      Long Term Additional Goals   Additional Long Term Goals Yes      OT LONG TERM GOAL #6   Title Pt will be able to perform all lymphedema self-care home program components using correct techniques with extra time and assistive devices PRN (modified independence), including simple self MLD, lymphatic pumping exercise, don and doff appropriate compression garments/ devices with Max A, and perform daily skin care regime  to limit lymphedema progression and infection risk.    Baseline D    Time 12    Period Weeks    Status New    Target Date 10/29/21                   Plan - 08/26/21 1253     Clinical Impression  Statement Continued MLD to RLE/RLQ as established.Pt tolerated well without pain. Applied wraps as establlished. Pt's questions answered throughout session to her satisfaction. She is working on arranging for assistance between  visits for compression wrapping. This has not happened yet, and consequently limb volume is not reduced  to date. Cont as per POC.    OT Occupational Profile and History Detailed Assessment- Review of Records and additional review of physical, cognitive, psychosocial history related to current functional performance    Occupational performance deficits (Please refer to evaluation for details): ADL's;IADL's;Rest and Sleep;Leisure;Social Participation;Work    Games developer / Function / Physical Skills ADL;ROM;IADL;Edema;Balance;Skin integrity;Mobility;Flexibility;Decreased knowledge of precautions;Decreased knowledge of use of DME    Rehab Potential Good    Clinical Decision Making Several treatment options, min-mod task modification necessary    Comorbidities Affecting Occupational Performance: Presence of comorbidities impacting occupational performance    Modification or Assistance to Complete Evaluation  Min-Moderate modification of tasks or assist with assess necessary to complete eval    OT Frequency 2x / week    OT Duration 12 weeks    OT Treatment/Interventions Self-care/ADL training;DME and/or AE instruction;Manual lymph drainage;Compression bandaging;Therapeutic activities;Coping strategies training;Therapeutic exercise;Patient/family education;Manual Therapy    Consulted and Agree with Plan of Care Patient             Patient will benefit from skilled therapeutic intervention in order to improve the following deficits and impairments:   Body Structure  / Function / Physical Skills: ADL, ROM, IADL, Edema, Balance, Skin integrity, Mobility, Flexibility, Decreased knowledge of precautions, Decreased knowledge of use of DME       Visit Diagnosis: Lymphedema, not elsewhere classified    Problem List Patient Active Problem List   Diagnosis Date Noted   Diastolic heart failure (HCC) 07/30/2021   Hyperlipidemia 07/30/2021   Lymphedema 07/30/2021   Acute pain of left knee 07/02/2021   Mammogram abnormal 07/02/2021   Tachycardia 06/02/2021   Acute non-recurrent frontal sinusitis 06/02/2021   Encounter for screening mammogram for malignant neoplasm of breast 06/02/2021   Gait instability 06/02/2021   Primary hypertension 06/02/2021    Judithann Sauger, OT 08/26/2021, 12:59 PM   [facility] MAIN [facility] SERVICES [address] [place], [place], [address] Phone: [phone]   Fax:  [phone]  Name: Milca Mcerlean MRN: [id number] Date of Birth: [birth year]

## 2021-08-26 NOTE — Therapy (Signed)
[facility] ?[place], [place], [address] ?Phone: [phone]   Fax:  [phone] ? ?Occupational Therapy Treatment ? ?Patient Details  ?Name: Mary [name] ?MRN: [id number] ?Date of Birth: Oct 08, 1940 ?Referring Provider (OT): Pershing Cox, MD ? ? ?Encounter Date: 08/25/2021 ? ? OT End of Session - 08/26/21 1258   ? ? Visit Number 8   ? Number of Visits 36   ? Date for OT Re-Evaluation 10/29/21   ? OT Start Time [phone]   ? OT Stop Time 0406   ? OT Time Calculation (min) 60 min   ? Activity Tolerance Patient tolerated treatment well;No increased pain   ? Behavior During Therapy [facility] for tasks assessed/performed   ? ?  ?  ? ?  ? ? ?Past Medical History:  ?Diagnosis Date  ? Arrhythmia   ? Back pain   ? Depression with anxiety   ? Hip pain   ? Hyperlipidemia   ? Hypertension   ? Paroxysmal tachycardia ([place])   ? ? ?Past Surgical History:  ?Procedure Laterality Date  ? ABDOMINAL HYSTERECTOMY    ? BREAST BIOPSY    ? BREAST EXCISIONAL BIOPSY Left 2002  ? benign  ? CARDIAC CATHETERIZATION    ? 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  ? x2  ? TOTAL HIP ARTHROPLASTY Bilateral   ? done twice  ? ? ?There were no vitals filed for this visit. ? ? Subjective Assessment - 08/26/21 1253   ? ? Subjective  Harrietta Incorvaia presents for OT Rx vist 9/36 to address BLE lymphedema. She denies LE-related leg pain. Pt reports she tolerated compression wraps all night and took them off about 45 minutes before her session.   ? Pertinent History HTN, B TKA x 2, B THA x2, Diastolic heart failure, back pain, tachycardia   ? Limitations difficulty walking, decreased standing tolerance, impaired transfers and functional mobility, impaired LB dressing, Limited L hip A/PROM, impaired dynamic balance, difficulty w/ LB dressing-fitting shoes, socks, pants   ? Repetition Increases Symptoms   ? Special Tests Intake FOTO score = 55/100; + Stemmer   ? Patient Stated Goals get leg swelling under control; I want  to be able to get out and walk   ? Pain Onset Other (comment)   ? ?  ?  ? ?  ? ? ? ? ? ? ? ? ? ? ? ? ? ? ? OT Treatments/Exercises (OP) - 08/26/21 1254   ? ?  ? ADLs  ? ADL Education Given Yes   ?  ? Manual Therapy  ? Manual Therapy Edema management;Manual Lymphatic Drainage (MLD);Compression Bandaging   ? Manual Lymphatic Drainage (MLD) MLD to LLE/LLQ MLD utilizing short neck sequence, functional L inguinal LN, and proximal to distal J strokes to thigh, knee, leg and foot. Good tolerance.   ? Compression Bandaging LLE multilayer knee length compression wraps using 1 each 8,10 and 12 cm wide short stretch bandages over a single layer of cotton stockinett and circumferentially applied single layer of 0.4 cm thick Rosidal foam.   ? ?  ?  ? ?  ? ? ? ? ? ? ? ? ? OT Education - 08/26/21 1255   ? ? Education Details Continued Pt/ CG edu for lymphedema self care  and home program throughout session. Topics include multilayer, gradient compression wrapping, simple self-MLD, therapeutic lymphatic pumping exercises, skin/nail care, risk reduction factors and LE precautions, compression garments/recommendations and wear and care schedule and compression garment  donning / doffing using assistive devices. All questions answered to the Pt's satisfaction. Pt demonstrates understanding by accurate return.   ? Person(s) Educated Patient   ? Methods Explanation;Demonstration;Handout   ? Comprehension Verbalized understanding;Returned demonstration;Need further instruction   ? ?  ?  ? ?  ? ? ? ? ? ? OT Long Term Goals - 07/31/21 1224   ? ?  ? OT LONG TERM GOAL #1  ? Title Given this patient?s low Intake score of 55/100 on the functional outcomes FOTO tool, patient will experience an increase in function of 2 points to improve basic and instrumental ADLs performance, including lymphedema self-care.   ? Baseline 55/100   ? Time 12   ? Period Weeks   ? Status New   ? Target Date 10/29/21   ?  ? OT LONG TERM GOAL #2  ? Title Pt will  demonstrate understanding of lymphedema prevention strategies by identifying and discussing 5 precautions using printed reference (modified assistance) to reduce risk of progression and to limit infection risk.   ? Baseline Max A   ? Time 4   ? Period Days   ? Status New   ? Target Date --   4th OT visit  ?  ? OT LONG TERM GOAL #3  ? Title Pt will be able to apply knee length, multi-layer, short stretch compression wraps to one limb at a time using gradient techniques with MODIFIED INDEPENDENCE (extra time) to decrease limb volume, to limit infection risk, and to limit lymphedema progression.   ? Baseline Max A   ? Time 4   ? Period Days   ? Status New   ? Target Date --   4th OT visit  ?  ? OT LONG TERM GOAL #4  ? Title Pt will achieve at least a 10% limb volume reduction below the knee bilaterally to return limb to more normal size and shape, to limit infection risk, to decrease pain, to improve function, and to limit lymphedema progression.   ? Baseline Dependent   ? Time 12   ? Period Weeks   ? Status New   ? Target Date 10/29/21   ?  ? OT LONG TERM GOAL #5  ? Title Pt will achieve and sustain a least 85% compliance with all LE self-care home program components throughout Intensive Phase CDT, including daily skin inspection and care, lymphatic pumping ther ex, 23/7 compression wraps and simple self-MLD, to sustain clinical gains made in CDT and to limit lymphedema progression and further functional decline.   ? Baseline D   ? Time 12   ? Period Weeks   ? Status New   ? Target Date 10/29/21   ?  ? Long Term Additional Goals  ? Additional Long Term Goals Yes   ?  ? OT LONG TERM GOAL #6  ? Title Pt will be able to perform all lymphedema self-care home program components using correct techniques with extra time and assistive devices PRN (modified independence), including simple self MLD, lymphatic pumping exercise, don and doff appropriate compression garments/ devices with Max A, and perform daily skin care regime  to limit lymphedema progression and infection risk.   ? Baseline D   ? Time 12   ? Period Weeks   ? Status New   ? Target Date 10/29/21   ? ?  ?  ? ?  ? ? ? ? ? ? ? ? Plan - 08/26/21 1253   ? ? Clinical Impression  Statement Continued MLD to RLE/RLQ as established.Pt tolerated well without pain. Applied wraps as establlished. Pt's questions answered throughout session to her satisfaction. She is working on arranging for assistance between  visits for compression wrapping. This has not happened yet, and consequently limb volume is not reduced  to date. Cont as per POC.   ? OT Occupational Profile and History Detailed Assessment- Review of Records and additional review of physical, cognitive, psychosocial history related to current functional performance   ? Occupational performance deficits (Please refer to evaluation for details): ADL's;IADL's;Rest and Sleep;Leisure;Social Participation;Work   ? Body Structure / Function / Physical Skills ADL;ROM;IADL;Edema;Balance;Skin integrity;Mobility;Flexibility;Decreased knowledge of precautions;Decreased knowledge of use of DME   ? Rehab Potential Good   ? Clinical Decision Making Several treatment options, min-mod task modification necessary   ? Comorbidities Affecting Occupational Performance: Presence of comorbidities impacting occupational performance   ? Modification or Assistance to Complete Evaluation  Min-Moderate modification of tasks or assist with assess necessary to complete eval   ? OT Frequency 2x / week   ? OT Duration 12 weeks   ? OT Treatment/Interventions Self-care/ADL training;DME and/or AE instruction;Manual lymph drainage;Compression bandaging;Therapeutic activities;Coping strategies training;Therapeutic exercise;Patient/family education;Manual Therapy   ? Consulted and Agree with Plan of Care Patient   ? ?  ?  ? ?  ? ? ?Patient will benefit from skilled therapeutic intervention in order to improve the following deficits and impairments:   ?Body Structure  / Function / Physical Skills: ADL, ROM, IADL, Edema, Balance, Skin integrity, Mobility, Flexibility, Decreased knowledge of precautions, Decreased knowledge of use of DME ?  ?  ? ? ?Visit Diagnosis: ?Lymph

## 2021-08-26 NOTE — Therapy (Signed)
[place] [facility] ?[place], [place], [address] ?Phone: [phone]   Fax:  [phone] ? ?Occupational Therapy Treatment ? ?Patient Details  ?Name: Mary [name] ?MRN: [id number] ?Date of Birth: [birth year] ?Referring Provider (OT): Pershing Cox, MD ? ? ?Encounter Date: 08/24/2021 ? ? OT End of Session - 08/26/21 1312   ? ? Visit Number 9   ? Number of Visits 36   ? Date for OT Re-Evaluation 10/29/21   ? OT Start Time 0304   ? OT Stop Time 0414   ? OT Time Calculation (min) 70 min   ? Activity Tolerance Patient tolerated treatment well;No increased pain   ? Behavior During Therapy [facility] for tasks assessed/performed   ? ?  ?  ? ?  ? ? ?Past Medical History:  ?Diagnosis Date  ? Arrhythmia   ? Back pain   ? Depression with anxiety   ? Hip pain   ? Hyperlipidemia   ? Hypertension   ? Paroxysmal tachycardia ([place])   ? ? ?Past Surgical History:  ?Procedure Laterality Date  ? ABDOMINAL HYSTERECTOMY    ? BREAST BIOPSY    ? BREAST EXCISIONAL BIOPSY Left 2002  ? benign  ? CARDIAC CATHETERIZATION    ? 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  ? x2  ? TOTAL HIP ARTHROPLASTY Bilateral   ? done twice  ? ? ?There were no vitals filed for this visit. ? ? ? ? ? ? ? ? ? ? ? ? ? ? ? ? OT Treatments/Exercises (OP) - 08/26/21 1318   ? ?  ? ADLs  ? ADL Education Given Yes   ?  ? Manual Therapy  ? Manual Therapy Edema management;Manual Lymphatic Drainage (MLD);Compression Bandaging   ? Manual Lymphatic Drainage (MLD) MLD to LLE/LLQ MLD utilizing short neck sequence, functional L inguinal LN, and proximal to distal J strokes to thigh, knee, leg and foot. Good tolerance.   ? Compression Bandaging LLE multilayer knee length compression wraps using 1 each 8,10 and 12 cm wide short stretch bandages over a single layer of cotton stockinett and circumferentially applied single layer of 0.4 cm thick Rosidal foam.   ? ?  ?  ? ?  ? ? ? ? ? ? ? ? ? OT Education - 08/26/21 1318   ? ? Education Details  Continued Pt/ CG edu for lymphedema self care  and home program throughout session. Topics include multilayer, gradient compression wrapping, simple self-MLD, therapeutic lymphatic pumping exercises, skin/nail care, risk reduction factors and LE precautions, compression garments/recommendations and wear and care schedule and compression garment donning / doffing using assistive devices. All questions answered to the Pt's satisfaction. Pt demonstrates understanding by accurate return.   ? Person(s) Educated Patient   ? Methods Explanation;Demonstration;Handout   ? Comprehension Verbalized understanding;Returned demonstration;Need further instruction   ? ?  ?  ? ?  ? ? ? ? ? ? OT Long Term Goals - 07/31/21 1224   ? ?  ? OT LONG TERM GOAL #1  ? Title Given this patient?s low Intake score of 55/100 on the functional outcomes FOTO tool, patient will experience an increase in function of 2 points to improve basic and instrumental ADLs performance, including lymphedema self-care.   ? Baseline 55/100   ? Time 12   ? Period Weeks   ? Status New   ? Target Date 10/29/21   ?  ? OT LONG TERM GOAL #2  ? Title Pt will demonstrate understanding  of lymphedema prevention strategies by identifying and discussing 5 precautions using printed reference (modified assistance) to reduce risk of progression and to limit infection risk.   ? Baseline Max A   ? Time 4   ? Period Days   ? Status New   ? Target Date --   4th OT visit  ?  ? OT LONG TERM GOAL #3  ? Title Pt will be able to apply knee length, multi-layer, short stretch compression wraps to one limb at a time using gradient techniques with MODIFIED INDEPENDENCE (extra time) to decrease limb volume, to limit infection risk, and to limit lymphedema progression.   ? Baseline Max A   ? Time 4   ? Period Days   ? Status New   ? Target Date --   4th OT visit  ?  ? OT LONG TERM GOAL #4  ? Title Pt will achieve at least a 10% limb volume reduction below the knee bilaterally to return limb  to more normal size and shape, to limit infection risk, to decrease pain, to improve function, and to limit lymphedema progression.   ? Baseline Dependent   ? Time 12   ? Period Weeks   ? Status New   ? Target Date 10/29/21   ?  ? OT LONG TERM GOAL #5  ? Title Pt will achieve and sustain a least 85% compliance with all LE self-care home program components throughout Intensive Phase CDT, including daily skin inspection and care, lymphatic pumping ther ex, 23/7 compression wraps and simple self-MLD, to sustain clinical gains made in CDT and to limit lymphedema progression and further functional decline.   ? Baseline D   ? Time 12   ? Period Weeks   ? Status New   ? Target Date 10/29/21   ?  ? Long Term Additional Goals  ? Additional Long Term Goals Yes   ?  ? OT LONG TERM GOAL #6  ? Title Pt will be able to perform all lymphedema self-care home program components using correct techniques with extra time and assistive devices PRN (modified independence), including simple self MLD, lymphatic pumping exercise, don and doff appropriate compression garments/ devices with Max A, and perform daily skin care regime to limit lymphedema progression and infection risk.   ? Baseline D   ? Time 12   ? Period Weeks   ? Status New   ? Target Date 10/29/21   ? ?  ?  ? ?  ? ? ? ? ? ? ? ? Plan - 08/26/21 1313   ? ? Clinical Impression Statement Pt's DIL applied compression wraps during visit interval and they appear to have reduced limb swelling below the knee slightly since last observed. Positive indentations in skin once wraps removed demonstrate excellent compression gradient. Pt reports wraps were tolerable and pain free. We continued MLD to LLE/LLQ today while also teaching  and demonstrating the J stroke, short neck sequence, inguinal strokes, deep breathing,m and J strokes to medical knee at the "bottle neck. Pt demonstrated excellent return. We'll continue reviewing simple self-MLD daily until patient is able to eprform entire  LQ/LE sequence. Continued MLD to RLE/RLQ as established.Pt tolerated well without pain. Applied wraps as establlished. Pt's questions answered throughout session to her satisfaction. Cont as per POC.   ? OT Occupational Profile and History Detailed Assessment- Review of Records and additional review of physical, cognitive, psychosocial history related to current functional performance   ? Occupational performance deficits (Please  refer to evaluation for details): ADL's;IADL's;Rest and Sleep;Leisure;Social Participation;Work   ? Body Structure / Function / Physical Skills ADL;ROM;IADL;Edema;Balance;Skin integrity;Mobility;Flexibility;Decreased knowledge of precautions;Decreased knowledge of use of DME   ? Rehab Potential Good   ? Clinical Decision Making Several treatment options, min-mod task modification necessary   ? Comorbidities Affecting Occupational Performance: Presence of comorbidities impacting occupational performance   ? Modification or Assistance to Complete Evaluation  Min-Moderate modification of tasks or assist with assess necessary to complete eval   ? OT Frequency 2x / week   ? OT Duration 12 weeks   ? OT Treatment/Interventions Self-care/ADL training;DME and/or AE instruction;Manual lymph drainage;Compression bandaging;Therapeutic activities;Coping strategies training;Therapeutic exercise;Patient/family education;Manual Therapy   ? Consulted and Agree with Plan of Care Patient   ? ?  ?  ? ?  ? ? ?Patient will benefit from skilled therapeutic intervention in order to improve the following deficits and impairments:   ?Body Structure / Function / Physical Skills: ADL, ROM, IADL, Edema, Balance, Skin integrity, Mobility, Flexibility, Decreased knowledge of precautions, Decreased knowledge of use of DME ?  ?  ? ? ?Visit Diagnosis: ?Lymphedema, not elsewhere classified ? ? ? ?Problem List ?Patient Active Problem List  ? Diagnosis Date Noted  ? Diastolic heart failure ([place]) 07/30/2021  ? Hyperlipidemia  07/30/2021  ? Lymphedema 07/30/2021  ? Acute pain of left knee 07/02/2021  ? Mammogram abnormal 07/02/2021  ? Tachycardia 06/02/2021  ? Acute non-recurrent frontal sinusitis 06/02/2021  ? Encounter for scree

## 2021-08-26 NOTE — Patient Instructions (Signed)

## 2021-08-27 ENCOUNTER — Ambulatory Visit: Payer: Medicare Other

## 2021-08-27 DIAGNOSIS — I890 Lymphedema, not elsewhere classified: Secondary | ICD-10-CM | POA: Diagnosis not present

## 2021-08-27 DIAGNOSIS — R2681 Unsteadiness on feet: Secondary | ICD-10-CM

## 2021-08-27 DIAGNOSIS — M6281 Muscle weakness (generalized): Secondary | ICD-10-CM

## 2021-08-27 DIAGNOSIS — R2689 Other abnormalities of gait and mobility: Secondary | ICD-10-CM

## 2021-08-27 NOTE — Therapy (Signed)
[place] ?[facility] ?[place], [place], [address] ?Phone: [phone]   Fax:  [phone] ? ?Physical Therapy Treatment ? ?Patient Details  ?Name: Mary [name] ?MRN: [id number] ?Date of Birth: 1940-10-28 ?Referring Provider (PT): Charlynne Cousins, MD ? ? ?Encounter Date: 08/27/2021 ? ? PT End of Session - 08/27/21 1518   ? ? Visit Number 7   ? Number of Visits 25   ? Date for PT Re-Evaluation 10/27/21   ? PT Start Time 1515   ? PT Stop Time 5631   ? PT Time Calculation (min) 44 min   ? Equipment Utilized During Treatment Gait belt   ? Activity Tolerance Patient tolerated treatment well   ? Behavior During Therapy [facility] for tasks assessed/performed   ? ?  ?  ? ?  ? ? ?Past Medical History:  ?Diagnosis Date  ? Arrhythmia   ? Back pain   ? Depression with anxiety   ? Hip pain   ? Hyperlipidemia   ? Hypertension   ? Paroxysmal tachycardia ([place])   ? ? ?Past Surgical History:  ?Procedure Laterality Date  ? ABDOMINAL HYSTERECTOMY    ? BREAST BIOPSY    ? BREAST EXCISIONAL BIOPSY Left 2002  ? benign  ? CARDIAC CATHETERIZATION    ? 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  ? x2  ? TOTAL HIP ARTHROPLASTY Bilateral   ? done twice  ? ? ?There were no vitals filed for this visit. ? ? Subjective Assessment - 08/27/21 1519   ? ? Subjective Patient reports no falls or LOB since last session. Has been compliant with HEP.   ? Pertinent History Pt is a pleasant 81 y/o female referred to PT for gait instability. Pt ambulates with RW. She reports onset of gait instability started several years ago. 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 Pt has had PT in past following surgeries. Pt reports hx of frequent falls. She has had 2 falls in past 6 months. Pt believes falls are due to difficulty picking up L foot and dragging L foot. Pt reports no injury with falls.  Pt being seen by OT for lymphedema, reports LLE more  affected than RLE. Pt currently lives alone. Her spouse passed away recently in 2022-06-26. Her adult son and DIL live nearby and can assist her. Pt denies any dizziness or lightheadedness. Pt reports no pain currently. 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   ? Limitations Lifting;Standing;Walking;House hold activities   ? How long can you sit comfortably? not limited   ? How long can you stand comfortably? Pt reports only limited due to need to stretch lower back   ? How long can you walk comfortably? At least 30 minutes   ? Diagnostic tests per chart recent imaging (February) of hip/knee unremarkable.   ? Patient Stated Goals Pt would like to be able to walk without a cane or at least not have to use her RW.   ? Currently in Pain? No/denies   ? ?  ?  ? ?  ? ? ? ? ? ? ? ?TREATMENT: ?  ?Neuro Re-ed ?[place] with decreasing UE support 8x length of // bars ?Forward walk SUE support , backwards walk with BUE support 8x length  ? ?Standing with CGA next to support surface:  ?Airex pad:  static stand 30 seconds x 2 trials, noticeable trembling of ankles/LE's with fatigue and challenge to maintain stability ?Airex pad: horizontal head turns 30 seconds scanning room 10x ; cueing for arc of motion  ?Airex pad: vertical head turns 30 seconds, cueing for arc of motion, noticeable sway with upward gaze increasing demand on ankle righting reaction musculature ?Airex HWE:XHBZJ balls at target x 3 minutes ?Airex pad grab ball and throw into basketball hoop x 20 balls  ? ?3 way hedgehog taps (forward, lateral, backwards) 10x each LE; BUE support  ?  ?  ?TherEx ? Nustep Lvl 3 cues for RPM>60 seat position 7; UE and LE for cardiovascular and musculoskeletal challenge  ?10x STS without UE support  ?RTB across thighs  ?-lateral stepping 4x length of // bars  ?SUE support forward/backwards step 10x each LE ?  ?Seated: ?GTB abduction 15x ?GTB adduction 12x each  LE ?GTB around bilateral ankles: alternating LAQ 15x ?  ?  ?Pt educated throughout session about proper posture and technique with exercises. Improved exercise technique, movement at target joints, use of target muscles after min to mod verbal, visual, tactile cues. ?  ? ?Patient tolerates progressive stabilization interventions on unstable surfaces. She is able to reach and throw objects against pertubation's. She is highly motivated throughout physical therapy session and is able to tolerate all interventions. She would benefit from additional skilled PT Intervention to improve strength, balance and mobility. ? ? ? ? ? ? ? ? ? ? ? ? ? ? ? ? ? ? ? ? PT Education - 08/27/21 1519   ? ? Education Details exercise technique, body mechanics   ? Person(s) Educated Patient   ? Methods Explanation;Demonstration;Tactile cues;Verbal cues   ? Comprehension Verbalized understanding;Returned demonstration;Verbal cues required;Tactile cues required   ? ?  ?  ? ?  ? ? ? PT Short Term Goals - 08/05/21 1217   ? ?  ? PT SHORT TERM GOAL #1  ? Title Pt will be independent with HEP in order to improve strength and balance in order to decrease fall risk and improve function at home and work.   ? Baseline 3/7: provided pt with initial HEP   ? Time 6   ? Period Weeks   ? Status New   ? Target Date 09/15/21   ? ?  ?  ? ?  ? ? ? ? PT Long Term Goals - 08/05/21 1217   ? ?  ? PT LONG TERM GOAL #1  ? Title Patient will increase FOTO score to equal to or greater than 52 to demonstrate statistically significant improvement in mobility and quality of life.   ? Baseline 3/7: 50   ? Time 12   ? Period Weeks   ? Status New   ? Target Date 10/27/21   ?  ? PT LONG TERM GOAL #2  ? Title Pt will improve BERG by at least 3 points in order to demonstrate clinically significant improvement in balance.   ? Baseline 3/7: 40/56   ? Time 12   ? Period Weeks   ? Status New   ? Target Date 10/27/21   ?  ? PT LONG TERM GOAL #3  ? Title Pt will decrease 5TSTS by  at least 3 seconds in order to demonstrate clinically significant improvement in LE strength.   ? Baseline 3/7: 15.36 sec with use of BUEs to assist   ? Time 12   ? Period Weeks   ? Status New   ?  Target Date 10/27/21   ?  ? PT LONG TERM GOAL #4  ? Title Pt will improve ABC by at least 13% in order to demonstrate clinically significant improvement in balance confidence.   ? Baseline 3/7: 50.63%   ? Time 12   ? Period Weeks   ? Status New   ? Target Date 10/27/21   ?  ? PT LONG TERM GOAL #5  ? Title Patient will increase 10 meter walk test to >1.63ms as to improve gait speed for better community ambulation and to reduce fall risk.   ? Baseline 3/7: 0.75 m/s with 4WW   ? Time 12   ? Period Weeks   ? Status New   ? Target Date 10/27/21   ? ?  ?  ? ?  ? ? ? ? ? ? ? ? Plan - 08/27/21 1610   ? ? Clinical Impression Statement Patient tolerates progressive stabilization interventions on unstable surfaces. She is able to reach and throw objects against pertubation's. She is highly motivated throughout physical therapy session and is able to tolerate all interventions. She would benefit from additional skilled PT Intervention to improve strength, balance and mobility.   ? Personal Factors and Comorbidities Age;Past/Current Experience;Time since onset of injury/illness/exacerbation;Fitness;Comorbidity 3+   ? 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 Examination-Activity Limitations Bend;Reach Overhead;Stairs;Transfers;Lift;Squat;Locomotion Level;Carry;Stand;Dressing   ? Examination-Participation Restrictions Volunteer;Community Activity;Shop;Cleaning;Laundry;YValla LeaverWork   ? Stability/Clinical Decision Making Evolving/Moderate complexity   ? Rehab Potential Good   ? PT Frequency 2x / week   ? PT Duration 12 weeks   ? PT Treatment/Interventions ADLs/Self Care Home Management;Aquatic Therapy;Biofeedback;Canalith Repostioning;Cryotherapy;Electrical Stimulation;Moist  Heat;Traction;Ultrasound;DME Instruction;Gait training;Functional mBiochemist, clinicalTherapeutic activities;Therapeutic exercise;Balance training;Neuromuscular re-education;Patient/family education;Orthoti

## 2021-08-28 ENCOUNTER — Other Ambulatory Visit: Payer: Self-pay | Admitting: Internal Medicine

## 2021-08-28 ENCOUNTER — Telehealth: Payer: Self-pay

## 2021-08-28 ENCOUNTER — Encounter: Payer: Medicare Other | Admitting: Occupational Therapy

## 2021-08-28 NOTE — Telephone Encounter (Signed)
That's fine. It looks like she is due for a regular follow up in July. OK to schedule with me.  ?

## 2021-08-28 NOTE — Telephone Encounter (Signed)
Medication Refill - Medication: buPROPion (WELLBUTRIN SR) 150 MG 12 hr tablet ? ?Pt is almost completely out and leaves town next week.  ? ?Has the patient contacted their pharmacy? Yes.   ?(Agent: If no, request that the patient contact the pharmacy for the refill. If patient does not wish to contact the pharmacy document the reason why and proceed with request.) ?(Agent: If yes, when and what did the pharmacy advise?) ? ?Preferred Pharmacy (with phone number or street name):  ?[facility] [place], [place]  ?[address] [place] [address]  ?Phone: [phone] Fax: [phone]  ? ?Has the patient been seen for an appointment in the last year OR does the patient have an upcoming appointment? Yes.   ? ?Agent: Please be advised that RX refills may take up to 3 business days. We ask that you follow-up with your pharmacy. ? ?

## 2021-08-28 NOTE — Telephone Encounter (Signed)
Requested medication (s) are due for refill today - unknown ? ?Requested medication (s) are on the active medication list -yes ? ?Future visit scheduled -yes ? ?Last refill: unknown ? ?Notes to clinic: Request RF: historical provider  ? ?Requested Prescriptions  ?Pending Prescriptions Disp Refills  ? buPROPion (WELLBUTRIN SR) 150 MG 12 hr tablet    ?  Sig: Take 1 tablet (150 mg total) by mouth 2 (two) times daily.  ?  ? Psychiatry: Antidepressants - bupropion Failed - 08/28/2021  1:26 PM  ?  ?  Failed - Cr in normal range and within 360 days  ?  Creatinine, Ser  ?Date Value Ref Range Status  ?06/17/2021 1.01 (H) 0.57 - 1.00 mg/dL Final  ?  ?  ?  ?  Passed - AST in normal range and within 360 days  ?  AST  ?Date Value Ref Range Status  ?06/17/2021 16 0 - 40 IU/L Final  ?  ?  ?  ?  Passed - ALT in normal range and within 360 days  ?  ALT  ?Date Value Ref Range Status  ?06/17/2021 13 0 - 32 IU/L Final  ?  ?  ?  ?  Passed - Last BP in normal range  ?  BP Readings from Last 1 Encounters:  ?07/30/21 134/72  ?  ?  ?  ?  Passed - Valid encounter within last 6 months  ?  Recent Outpatient Visits   ? ?      ? 4 weeks ago Diastolic heart failure, unspecified HF chronicity ([place])  ? [place], MD  ? 1 month ago COVID-19  ? [place], NP  ? 1 month ago Acute pain of left knee  ? [facility] Vigg, [place], MD  ? 2 months ago Acute diastolic heart failure ([place])  ? [facility] Vigg, Avanti, MD  ? ?  ?  ?Future Appointments   ? ?        ? In 4 days Gollan, Kathlene November, MD [facility]  ? In 2 months  [place], [facility]  ? ?  ? ?  ?  ?  ? ? ? ?Requested Prescriptions  ?Pending Prescriptions Disp Refills  ? buPROPion (WELLBUTRIN SR) 150 MG 12 hr tablet    ?  Sig: Take 1 tablet (150 mg total) by mouth 2 (two) times daily.  ?  ? Psychiatry: Antidepressants - bupropion Failed - 08/28/2021  1:26 PM  ?  ?  Failed - Cr in  normal range and within 360 days  ?  Creatinine, Ser  ?Date Value Ref Range Status  ?06/17/2021 1.01 (H) 0.57 - 1.00 mg/dL Final  ?  ?  ?  ?  Passed - AST in normal range and within 360 days  ?  AST  ?Date Value Ref Range Status  ?06/17/2021 16 0 - 40 IU/L Final  ?  ?  ?  ?  Passed - ALT in normal range and within 360 days  ?  ALT  ?Date Value Ref Range Status  ?06/17/2021 13 0 - 32 IU/L Final  ?  ?  ?  ?  Passed - Last BP in normal range  ?  BP Readings from Last 1 Encounters:  ?07/30/21 134/72  ?  ?  ?  ?  Passed - Valid encounter within last 6 months  ?  Recent Outpatient Visits   ? ?      ?  4 weeks ago Diastolic heart failure, unspecified HF chronicity ([place])  ? [place], MD  ? 1 month ago COVID-19  ? [place], NP  ? 1 month ago Acute pain of left knee  ? [facility] Vigg, Avanti, MD  ? 2 months ago Acute diastolic heart failure ([place])  ? [facility] Vigg, Avanti, MD  ? ?  ?  ?Future Appointments   ? ?        ? In 4 days Gollan, Kathlene November, MD [facility], [facility]  ? In 2 months  [place], [facility]  ? ?  ? ?  ?  ?  ? ? ? ?

## 2021-08-28 NOTE — Telephone Encounter (Signed)
Copied from [place] [id number]. Topic: Appointment Scheduling - Scheduling Inquiry for Clinic ?>> Aug 28, 2021 10:21 AM Erick Blinks wrote: ?Reason for CRM: Pt wants to change her PCP to Dr. Wynetta Emery, does not need an appt ?

## 2021-08-28 NOTE — Telephone Encounter (Signed)
Called patient regarding request to change PCP to Dr. Wynetta Emery. Patient states she wants to switch because Dr. Neomia Dear is leaving. Informed patient that a temporary APP provider will see Dr. Levada Dy patients until a permanent provider is hired. Patient states she prefers to see a female doctor only and not an APP. Patient requested that a message be sent to Dr. Wynetta Emery for consideration. Please advise. ?

## 2021-08-31 ENCOUNTER — Ambulatory Visit: Payer: Medicare Other | Attending: Cardiovascular Disease | Admitting: Occupational Therapy

## 2021-08-31 ENCOUNTER — Ambulatory Visit: Payer: Medicare Other | Admitting: Physical Therapy

## 2021-08-31 ENCOUNTER — Encounter: Payer: Self-pay | Admitting: Physical Therapy

## 2021-08-31 DIAGNOSIS — R2689 Other abnormalities of gait and mobility: Secondary | ICD-10-CM | POA: Insufficient documentation

## 2021-08-31 DIAGNOSIS — R2681 Unsteadiness on feet: Secondary | ICD-10-CM

## 2021-08-31 DIAGNOSIS — R262 Difficulty in walking, not elsewhere classified: Secondary | ICD-10-CM | POA: Diagnosis present

## 2021-08-31 DIAGNOSIS — I890 Lymphedema, not elsewhere classified: Secondary | ICD-10-CM | POA: Insufficient documentation

## 2021-08-31 DIAGNOSIS — M6281 Muscle weakness (generalized): Secondary | ICD-10-CM | POA: Insufficient documentation

## 2021-08-31 DIAGNOSIS — R278 Other lack of coordination: Secondary | ICD-10-CM | POA: Insufficient documentation

## 2021-08-31 NOTE — Therapy (Signed)
[place] ?[facility] ?[place], [place], [address] ?Phone: [phone]   Fax:  [phone] ? ?Occupational Therapy Treatment ? ?Patient Details  ?Name: Mary [name] ?MRN: [id number] ?Date of Birth: 1941-02-23 ?Referring Provider (OT): Pershing Cox, MD ? ? ?Encounter Date: 08/31/2021 ? ? OT End of Session - 08/31/21 1320   ? ? Number of Visits 36   ? Date for OT Re-Evaluation 10/29/21   ? OT Start Time 0110   ? OT Stop Time 0215   ? OT Time Calculation (min) 65 min   ? Activity Tolerance Patient tolerated treatment well;No increased pain   ? Behavior During Therapy [facility] for tasks assessed/performed   ? ?  ?  ? ?  ? ? ?Past Medical History:  ?Diagnosis Date  ? Arrhythmia   ? Back pain   ? Depression with anxiety   ? Hip pain   ? Hyperlipidemia   ? Hypertension   ? Paroxysmal tachycardia ([place])   ? ? ?Past Surgical History:  ?Procedure Laterality Date  ? ABDOMINAL HYSTERECTOMY    ? BREAST BIOPSY    ? BREAST EXCISIONAL BIOPSY Left 2002  ? benign  ? CARDIAC CATHETERIZATION    ? 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  ? x2  ? TOTAL HIP ARTHROPLASTY Bilateral   ? done twice  ? ? ?There were no vitals filed for this visit. ? ? Subjective Assessment - 08/31/21 1440   ? ? Subjective  [name] presents for OT Rx vist 10/36 to address BLE lymphedema. She denies LE-related leg pain. Pt reports she did not wrap leg over the weekend and will not wrap next week  [place].   ? Pertinent History HTN, B TKA x 2, B THA x2, Diastolic heart failure, back pain, tachycardia   ? Limitations difficulty walking, decreased standing tolerance, impaired transfers and functional mobility, impaired LB dressing, Limited L hip A/PROM, impaired dynamic balance, difficulty w/ LB dressing-fitting shoes, socks, pants   ? Repetition Increases Symptoms   ? Special Tests Intake FOTO score = 55/100; + [place]   ? Patient Stated Goals get leg swelling under control; I want to  be able to get out and walk   ? Currently in Pain? No/denies   ? Pain Onset Other (comment)   ? ?  ?  ? ?  ? ? ? ? ? ? ? ? ? ? ? ? ? ? ? OT Treatments/Exercises (OP) - 08/31/21 1447   ? ?  ? ADLs  ? ADL Education Given Yes   ?  ? Manual Therapy  ? Manual Therapy Edema management;Manual Lymphatic Drainage (MLD);Compression Bandaging   ? Edema Management Completed goal review and progress report   ? Manual Lymphatic Drainage (MLD) MLD to LLE/LLQ MLD utilizing short neck sequence, functional L inguinal LN, and proximal to distal J strokes to thigh, knee, leg and foot. Good tolerance.   ? Compression Bandaging LLE multilayer knee length compression wraps using 1 each 8,10 and 12 cm wide short stretch bandages over a single layer of cotton stockinett and circumferentially applied single layer of 0.4 cm thick Rosidal foam.   ? ?  ?  ? ?  ? ? ? ? ? ? ? ? ? OT Education - 08/31/21 1448   ? ? Education Details Continued Pt/ CG edu for lymphedema self care  and home program throughout session. Topics include multilayer, gradient compression wrapping, simple self-MLD, therapeutic lymphatic pumping exercises,  skin/nail care, risk reduction factors and LE precautions, compression garments/recommendations and wear and care schedule and compression garment donning / doffing using assistive devices. All questions answered to the Pt's satisfaction. Pt demonstrates understanding by accurate return.   ? Person(s) Educated Patient   ? Methods Explanation;Demonstration;Handout   ? Comprehension Verbalized understanding;Returned demonstration;Need further instruction   ? ?  ?  ? ?  ? ? ? ? ? ? OT Long Term Goals - 08/31/21 1321   ? ?  ? OT LONG TERM GOAL #1  ? Title Given this patient?s low Intake score of 55/100 on the functional outcomes FOTO tool, patient will experience an increase in function of 2 points to improve basic and instrumental ADLs performance, including lymphedema self-care.   ? Baseline 55/100   ? Time 12   ? Period  Weeks   ? Status On-going   ? Target Date 10/29/21   ?  ? OT LONG TERM GOAL #2  ? Title Pt will demonstrate understanding of lymphedema prevention strategies by identifying and discussing 5 precautions using printed reference (modified assistance) to reduce risk of progression and to limit infection risk.   ? Baseline Max A   ? Time 4   ? Period Days   ? Status On-going   ? Target Date --   4th OT visit  ?  ? OT LONG TERM GOAL #3  ? Title Pt will be able to apply knee length, multi-layer, short stretch compression wraps to one limb at a time using gradient techniques with MODIFIED INDEPENDENCE (extra time) to decrease limb volume, to limit infection risk, and to limit lymphedema progression. 08/31/21: Goal revised:   "With Maximum CG assistance"   ? Baseline Max A   ? Time 4   ? Period Days   ? Status Partially Met   Patient's family member has learned to apply wraps using gradient techniques, however, limb is not consistently wrapped on a daily basis as is recommended for optimal volume reduction  ? Target Date --   4th OT visit  ?  ? OT LONG TERM GOAL #4  ? Title Pt will achieve at least a 10% limb volume reduction below the knee bilaterally to return limb to more normal size and shape, to limit infection risk, to decrease pain, to improve function, and to limit lymphedema progression.   ? Baseline Dependent   ? Time 12   ? Period Weeks   ? Status On-going   Zero limb volume reduction to date due to Pt not being able to wrap self, and limited CG availabliuty to learn to apply wraps initially, and currently to assist  with wraps daily as recomended  ? Target Date 10/29/21   ?  ? OT LONG TERM GOAL #5  ? Title Pt will achieve and sustain a least 85% compliance with all LE self-care home program components throughout Intensive Phase CDT, including daily skin inspection and care, lymphatic pumping ther ex, 23/7 compression wraps and simple self-MLD, to sustain clinical gains made in CDT and to limit lymphedema  progression and further functional decline.   ? Baseline D   ? Time 12   ? Period Weeks   ? Status On-going   08/31/21: Estimate <25% compliance with home program to date  ? Target Date 10/29/21   ?  ? OT LONG TERM GOAL #6  ? Title Pt will be able to perform all lymphedema self-care home program components using correct techniques with extra time and assistive devices  PRN (modified independence), including simple self MLD, lymphatic pumping exercise, don and doff appropriate compression garments/ devices with Max A, and perform daily skin care regime to limit lymphedema progression and infection risk.   ? Baseline D   ? Time 12   ? Period Weeks   ? Status On-going   ? Target Date 10/29/21   ? ?  ?  ? ?  ? ? ? ? ? ? ? ? Plan - 08/31/21 1441   ? ? Clinical Impression Statement Reviewed progress towards all goals with patient. To date the greatest obstacles to progress is limited family caregiver availability, first to learn to apply wraps, and secondly to apply them on a daily basis as recommended for effective CDT. Pt's daughter in law attended session and learned wrapping, but Pt is not wrapping on weekends, and minimally on days between OT sessions. Volume reduction will be achieved only with optimal compression techniques and frequency. Please review LONG TERM GOALS section for report of limited progress to date. Provided MLD and simultaneous skin care, followed by knee length, gradient compression wraps to LLE. Good tolerance. Cont 2 x weekly per POC.   ? OT Occupational Profile and History Detailed Assessment- Review of Records and additional review of physical, cognitive, psychosocial history related to current functional performance   ? Occupational performance deficits (Please refer to evaluation for details): ADL's;IADL's;Rest and Sleep;Leisure;Social Participation;Work   ? Body Structure / Function / Physical Skills ADL;ROM;IADL;Edema;Balance;Skin integrity;Mobility;Flexibility;Decreased knowledge of  precautions;Decreased knowledge of use of DME   ? Rehab Potential Good   ? Clinical Decision Making Several treatment options, min-mod task modification necessary   ? Comorbidities Affecting Occupational Performance: Prese

## 2021-08-31 NOTE — Therapy (Signed)
[place] ?[facility] ?[place], [place], [address] ?Phone: [phone]   Fax:  [phone] ? ?Physical Therapy Treatment ? ?Patient Details  ?Name: Mary [name] ?MRN: [id number] ?Date of Birth: 1940/09/03 ?Referring Provider (PT): Charlynne Cousins, MD ? ? ?Encounter Date: 08/31/2021 ? ? PT End of Session - 08/31/21 1706   ? ? Visit Number 9   ? Number of Visits 25   ? Date for PT Re-Evaluation 10/27/21   ? PT Start Time 1515   ? PT Stop Time 1600   ? PT Time Calculation (min) 45 min   ? Equipment Utilized During Treatment Gait belt   ? Activity Tolerance Patient tolerated treatment well   ? Behavior During Therapy [facility] for tasks assessed/performed   ? ?  ?  ? ?  ? ? ?Past Medical History:  ?Diagnosis Date  ? Arrhythmia   ? Back pain   ? Depression with anxiety   ? Hip pain   ? Hyperlipidemia   ? Hypertension   ? Paroxysmal tachycardia ([place])   ? ? ?Past Surgical History:  ?Procedure Laterality Date  ? ABDOMINAL HYSTERECTOMY    ? BREAST BIOPSY    ? BREAST EXCISIONAL BIOPSY Left 2002  ? benign  ? CARDIAC CATHETERIZATION    ? 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  ? x2  ? TOTAL HIP ARTHROPLASTY Bilateral   ? done twice  ? ? ?There were no vitals filed for this visit. ? ? Subjective Assessment - 08/31/21 1513   ? ? Subjective Patient reports no falls or LOB since last session.  Patient reports her grandson's wedding is in a couple weeks and she still does not feel completely confident with ambulation tasks required.  Would like to continue working on this this session in future sessions.   ? Pertinent History Pt is a pleasant 81 y/o female referred to PT for gait instability. Pt ambulates with RW. She reports onset of gait instability started several years ago. 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 Pt has had PT in past following surgeries. Pt reports hx of frequent falls. She has had 2 falls in  past 6 months. Pt believes falls are due to difficulty picking up L foot and dragging L foot. Pt reports no injury with falls.  Pt being seen by OT for lymphedema, reports LLE more affected than RLE. Pt currently lives alone. Her spouse passed away recently in 2022/06/15. Her adult son and DIL live nearby and can assist her. Pt denies any dizziness or lightheadedness. Pt reports no pain currently. 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   ? Limitations Lifting;Standing;Walking;House hold activities   ? How long can you sit comfortably? not limited   ? How long can you stand comfortably? Pt reports only limited due to need to stretch lower back   ? How long can you walk comfortably? At least 30 minutes   ? Diagnostic tests per chart recent imaging (February) of hip/knee unremarkable.   ? Patient Stated Goals Pt would like to be able to walk without a cane or at least not have to use her RW.   ? Currently in Pain? No/denies   ? ?  ?  ? ?  ? ? ? ?TREATMENT: ?  ?Neuro Re-ed ?[place] with decreasing UE support 8x length  of // bars ?Forward walk SUE support , backwards walk with BUE support 8x length  ? ?Standing with CGA next to support surface:  ?Airex pad: static stand 30 seconds x 2 trials, noticeable trembling of ankles/LE's with fatigue and challenge to maintain stability ?Airex pad: horizontal head turns 30 seconds scanning room 10x ; cueing for arc of motion  ?Airex pad: vertical head turns 30 seconds, cueing for arc of motion, noticeable sway with upward gaze increasing demand on ankle righting reaction musculature ? ? ? ?  ?  ?TherEx ? STS circuit with forward and retro walking ?- x 5 STS with 2 laps of forward and retro walking, forward walking with R HH assist and retro walking with B HH A  ?RTB across thighs  ?-lateral stepping 4x length of // bars  ?SUE support forward/backwards step 10x each LE ?  ?Seated: ?GTB abduction 15x ?GTB adduction  12x each LE ?GTB around bilateral ankles: alternating LAQ 15x ?  Gait training ?Walking in 10 m increments.  Patient walked 2 x 10 m with physical therapists elbow and contralateral upper extremity for stability.  Patient will to complete this safely but not with good efficiency.  Completed 4 x 10 m with physical therapist elbow as well as cane in right lower upper extremity for stability.  With cane patient able to ambulate more efficiently and with improved stability and safety.  Patient structured this may be the best approach for her to walk down the aisle at her grandsons wedding.  We will continue to work on this and practice in future sessions.  Minimal cues for step length and proper cane utilization throughout. ?  ?Pt educated throughout session about proper posture and technique with exercises. Improved exercise technique, movement at target joints, use of target muscles after min to mod verbal, visual, tactile cues. ?  ? ? ? ? ? ? ? ? ? ? ? ? ? ? ? ? ? ? ? ? ? ? ? ? ? ? ? ? PT Education - 08/31/21 1705   ? ? Education Details exercise technique   ? Person(s) Educated Patient   ? Methods Explanation   ? Comprehension Verbalized understanding   ? ?  ?  ? ?  ? ? ? PT Short Term Goals - 08/05/21 1217   ? ?  ? PT SHORT TERM GOAL #1  ? Title Pt will be independent with HEP in order to improve strength and balance in order to decrease fall risk and improve function at home and work.   ? Baseline 3/7: provided pt with initial HEP   ? Time 6   ? Period Weeks   ? Status New   ? Target Date 09/15/21   ? ?  ?  ? ?  ? ? ? ? PT Long Term Goals - 08/05/21 1217   ? ?  ? PT LONG TERM GOAL #1  ? Title Patient will increase FOTO score to equal to or greater than 52 to demonstrate statistically significant improvement in mobility and quality of life.   ? Baseline 3/7: 50   ? Time 12   ? Period Weeks   ? Status New   ? Target Date 10/27/21   ?  ? PT LONG TERM GOAL #2  ? Title Pt will improve BERG by at least 3 points in order  to demonstrate clinically significant improvement in balance.   ? Baseline 3/7: 40/56   ? Time 12   ? Period Weeks   ?  Status New   ? Target Date 10/27/21   ?  ? PT LONG TERM GOAL #3  ? Title Pt will decrease 5TSTS by at least 3 seconds in order to demonstrate clinically significant improvement in LE strength.   ? Baseline 3/7: 15.36 sec with use of BUEs to assist   ? Time 12   ? Period Weeks   ? Status New   ? Target Date 10/27/21   ?  ? PT LONG TERM GOAL #4  ? Title Pt will improve ABC by at least 13% in order to demonstrate clinically significant improvement in balance confidence.   ? Baseline 3/7: 50.63%   ? Time 12   ? Period Weeks   ? Status New   ? Target Date 10/27/21   ?  ? PT LONG TERM GOAL #5  ? Title Patient will increase 10 meter walk test to >1.36ms as to improve gait speed for better community ambulation and to reduce fall risk.   ? Baseline 3/7: 0.75 m/s with 4WW   ? Time 12   ? Period Weeks   ? Status New   ? Target Date 10/27/21   ? ?  ?  ? ?  ? ? ? ? ? ? ? ? Plan - 08/31/21 1521   ? ? Clinical Impression Statement Patient presents with excellent motivation for completion of physical therapy program.  Patient trialed with several walking techniques for walking down the aisle at upcoming grandsons wedding.  Patient most comfortable and stable with ambulation with cane as well as holding onto PT simulating Groomsman for wedding.  Patient started to continue to practice with ambulation with cane in future sessions as well as at home into proximal until she is comfortable during the rehearsal dinner for this process.  Patient progressed with several balance activities in parallel bars and still has difficulty getting up and down from a standard chair without using her arms.  Patient will continue to benefit from skilled physical therapy intervention in order to improve her strength, balance, and mobility.   ? Personal Factors and Comorbidities Age;Past/Current Experience;Time since onset of  injury/illness/exacerbation;Fitness;Comorbidity 3+   ? 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 Examination-Activity Limitations [place];Reach Overhead;

## 2021-08-31 NOTE — Patient Instructions (Signed)

## 2021-08-31 NOTE — Progress Notes (Signed)
Cardiology Office Note ? ?Date:  09/01/2021  ? ?ID:  Mary [name], DOB April 04, 1941, MRN [id number] ? ?PCP:  Valerie Roys, DO  ? ?Chief Complaint  ?Patient presents with  ? 12 month follow up   ?  Patient c/o fluttering in chest at times. Medications reviewed by the patient verbally.   ? ? ?HPI:  ?Mary [name] is a 81 year old woman with past medical history of ?Lymphedema, has pumps ?Paroxysmal tachycardia of unclear etiology, on beta-blocker (possible SVT but no clear documentation) ?Hypertension ?Hyperlipidemia ?Diastolic CHF on Lasix ?Who presents for follow-up of her leg swelling, tachycardia ? ?Last seen in clinic by myself October 2022 ?Has been maintained on metoprolol succinate 25 daily for paroxysmal tachycardia ? ?Husband died 28-Jun-2021?Stress, having palpiations at times ?Lots of paperwork ? ?Using Lymphedema  ?Daughter in law wraps leg ? ?Doing PT ?Gait instability, walks with a walker ?Plans on walking down the aisle at a wedding this summer ? ?EKG personally reviewed by myself on todays visit ?Normal sinus rhythm rate 65 bpm unable to exclude old anterior MI ? ?Reports having very symptomatic tachycardia in the past, unclear etiology ?Wore a monitor, did not have symptoms while monitor was in place ?Was told monitor showed 4 beat run VT and was started on metoprolol succinate 50 daily ?Secondary to low blood pressure metoprolol decreased from 50 down to 25 ?On this dose denies any orthostatic symptoms ? ?No regular exercise program ?Has lymphedema, worse on the left than the right ?Prior bilateral knee replacements ?Does not use her lymphedema compression pumps as she should ? ?Uses a cane/walker to get around ? ?Lab work reviewed ?a1C 5.9 ?Has had additional lab work with primary care, this has been requested ? ?Weight coming down,  ?Used to be 240 pounds, now 187 through dietary changes ? ? ?PMH:   has a past medical history of Arrhythmia, Back pain, Depression with anxiety, Hip pain, Hyperlipidemia,  Hypertension, and Paroxysmal tachycardia ([place]). ? ?PSH:    ?Past Surgical History:  ?Procedure Laterality Date  ? ABDOMINAL HYSTERECTOMY    ? BREAST BIOPSY    ? BREAST EXCISIONAL BIOPSY Left 2002  ? benign  ? CARDIAC CATHETERIZATION    ? 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  ? x2  ? TOTAL HIP ARTHROPLASTY Bilateral   ? done twice  ? ? ?Current Outpatient Medications  ?Medication Sig Dispense Refill  ? bumetanide (BUMEX) 1 MG tablet Take one tablet ('1mg'$ ) every other day in the AM as needed.    ? buPROPion (WELLBUTRIN SR) 150 MG 12 hr tablet Take 1 tablet (150 mg total) by mouth 2 (two) times daily. 30 tablet 3  ? Cholecalciferol (VITAMIN D3) 25 MCG (1000 UT) CAPS Take 1,000 Units by mouth daily.    ? fexofenadine (ALLEGRA ALLERGY) 180 MG tablet Take 1 tablet (180 mg total) by mouth daily. 10 tablet 1  ? gabapentin (NEURONTIN) 300 MG capsule Take 300 mg by mouth 3 (three) times daily.    ? LORazepam (ATIVAN) 1 MG tablet Take 1 mg by mouth 3 (three) times daily as needed for anxiety.    ? meloxicam (MOBIC) 7.5 MG tablet Mobic 7.5 mg tablet ? Take 1 tablet every day by oral route.    ? metoprolol succinate (TOPROL-XL) 25 MG 24 hr tablet Take 1 tablet (25 mg total) by mouth daily. 90 tablet 3  ? pravastatin (PRAVACHOL) 40 MG tablet Take 40 mg by mouth daily.    ? sertraline (ZOLOFT) 50 MG tablet  Take 1 tablet (50 mg total) by mouth daily. 30 tablet 2  ? traMADol (ULTRAM) 50 MG tablet Take 50 mg by mouth every 6 (six) hours as needed.    ? ?No current facility-administered medications for this visit.  ? ? ?Allergies:   Codeine and Simvastatin  ? ?Social History:  The patient  reports that she has quit smoking. Her smoking use included cigarettes. She has never used smokeless tobacco. She reports that she does not currently use alcohol. She reports that she does not use drugs.  ? ?Family History:   family history includes CAD in her mother; Hypertension in her mother and sister; Lung cancer in her father; Multiple sclerosis in her  sister; Pancreatic cancer in her mother.  ? ? ?Review of Systems: ?Review of Systems  ?Constitutional: Negative.   ?HENT: Negative.    ?Respiratory: Negative.    ?Cardiovascular:  Positive for leg swelling.  ?Gastrointestinal: Negative.   ?Musculoskeletal: Negative.   ?     Gait instability  ?Neurological: Negative.   ?Psychiatric/Behavioral: Negative.    ?All other systems reviewed and are negative. ? ? ?PHYSICAL EXAM: ?VS:  BP 100/60 (BP Location: Left Arm, Patient Position: Sitting, Cuff Size: Normal)   Pulse 65   Ht '5\' 4"'$  (1.626 m)   Wt 82.6 kg   SpO2 97%   BMI 31.24 kg/m?  , BMI Body mass index is 31.24 kg/m?Marland Kitchen ?Constitutional:  oriented to person, place, and time. No distress.  ?HENT:  ?Head: Grossly normal ?Eyes:  no discharge. No scleral icterus.  ?Neck: No JVD, no carotid bruits  ?Cardiovascular: Regular rate and rhythm, no murmurs appreciated ?Nonpitting lower extremity edema left greater than right ?Pulmonary/Chest: Clear to auscultation bilaterally, no wheezes or rails ?Abdominal: Soft.  no distension.  no tenderness.  ?Musculoskeletal: Normal range of motion ?Neurological:  normal muscle tone. Coordination normal. No atrophy ?Skin: Skin warm and dry ?Psychiatric: normal affect, pleasant ? ? ?Recent Labs: ?06/17/2021: ALT 13; BUN 23; Creatinine, Ser 1.01; Hemoglobin 13.4; Platelets 230; Potassium 5.0; Sodium 142; TSH 1.590  ? ? ?Lipid Panel ?Lab Results  ?Component Value Date  ? CHOL 186 06/17/2021  ? HDL 75 06/17/2021  ? New Site 95 06/17/2021  ? TRIG 90 06/17/2021  ? ?  ? ?Wt Readings from Last 3 Encounters:  ?09/01/21 82.6 kg  ?07/30/21 84.4 kg  ?07/02/21 84.9 kg  ?  ? ?ASSESSMENT AND PLAN: ? ?Problem List Items Addressed This Visit   ? ? Lymphedema  ? Relevant Orders  ? EKG 12-Lead  ? Primary hypertension  ? Relevant Orders  ? EKG 12-Lead  ? ?Other Visit Diagnoses   ? ? Paroxysmal tachycardia ([place])    -  Primary  ? Relevant Orders  ? EKG 12-Lead  ? ?  ?Paroxysmal tachycardia ?Continue metoprolol  succinate 25 daily ?Stable symptoms ? ?Essential hypertension ?Blood pressure low, recommend hydration.  It is 2 PM, only had a cup of coffee today ? ?Lymphedema ?Followed by OT ?Recommend she continue wraps, compression pumps on a daily basis, leg elevation, staying active ? ?Hyperlipidemia ?Pravastatin refilled ? ?Preventive care ?Discussed need with importance of regular walking for gait stability that she lives alone ? ? Total encounter time more than 30 minutes ? Greater than 50% was spent in counseling and coordination of care with the patient ? ? ? ?Signed, ?Esmond Plants, M.D., Ph.D. ?[facility] [place], [place] ?[phone] ? ?

## 2021-09-01 ENCOUNTER — Encounter: Payer: Self-pay | Admitting: Cardiovascular Disease

## 2021-09-01 ENCOUNTER — Ambulatory Visit (INDEPENDENT_AMBULATORY_CARE_PROVIDER_SITE_OTHER): Payer: Medicare Other | Admitting: Cardiovascular Disease

## 2021-09-01 VITALS — BP 100/60 | HR 65 | Ht 64.0 in | Wt 182.0 lb

## 2021-09-01 DIAGNOSIS — I10 Essential (primary) hypertension: Secondary | ICD-10-CM | POA: Diagnosis not present

## 2021-09-01 DIAGNOSIS — I890 Lymphedema, not elsewhere classified: Secondary | ICD-10-CM | POA: Diagnosis not present

## 2021-09-01 DIAGNOSIS — I479 Paroxysmal tachycardia, unspecified: Secondary | ICD-10-CM

## 2021-09-01 MED ORDER — FUROSEMIDE 20 MG PO TABS: 20.0000 mg | ORAL_TABLET | ORAL | 3 refills | Status: DC

## 2021-09-01 MED ORDER — BUPROPION HCL ER (SR) 150 MG PO TB12: 150.0000 mg | ORAL_TABLET | Freq: Two times a day (BID) | ORAL | 3 refills | Status: DC

## 2021-09-01 MED ORDER — METOPROLOL SUCCINATE ER 25 MG PO TB24: 25.0000 mg | ORAL_TABLET | Freq: Every day | ORAL | 3 refills | Status: DC

## 2021-09-01 MED ORDER — PRAVASTATIN SODIUM 40 MG PO TABS: 40.0000 mg | ORAL_TABLET | Freq: Every day | ORAL | 3 refills | Status: DC

## 2021-09-01 NOTE — Telephone Encounter (Signed)
Pt notified and has been scheduled for fu in July ?

## 2021-09-01 NOTE — Patient Instructions (Addendum)
Medication Instructions:  ?Stop bumex ? ?Start furosemide/lasix 20 mg every other day ? ?If you need a refill on your cardiac medications before your next appointment, please call your pharmacy.  ? ?Lab work: ?No new labs needed ? ?Testing/Procedures: ?No new testing needed ? ?Follow-Up: ?At [facility], you and your health needs are our priority.  As part of our continuing mission to provide you with exceptional heart care, we have created designated Provider Care Teams.  These Care Teams include your primary Cardiologist (physician) and Advanced Practice Providers (APPs -  Physician Assistants and Nurse Practitioners) who all work together to provide you with the care you need, when you need it. ? ?You will need a follow up appointment in 12 months ? ?Providers on your designated Care Team:   ?Murray Hodgkins, NP ?Christell Faith, PA-C ?Cadence Kathlen Mody, PA-C ? ?COVID-19 Vaccine Information can be found at: [email or web address] For questions related to vaccine distribution or appointments, please email [email or web address] or call [phone].  ? ?

## 2021-09-02 ENCOUNTER — Ambulatory Visit: Payer: Medicare Other | Admitting: Occupational Therapy

## 2021-09-02 ENCOUNTER — Ambulatory Visit: Payer: Medicare Other

## 2021-09-02 DIAGNOSIS — I890 Lymphedema, not elsewhere classified: Secondary | ICD-10-CM

## 2021-09-02 DIAGNOSIS — R2681 Unsteadiness on feet: Secondary | ICD-10-CM

## 2021-09-02 DIAGNOSIS — M6281 Muscle weakness (generalized): Secondary | ICD-10-CM

## 2021-09-02 DIAGNOSIS — R2689 Other abnormalities of gait and mobility: Secondary | ICD-10-CM

## 2021-09-02 NOTE — Therapy (Signed)
[place] [facility] ?[place], [place], [address] ?Phone: [phone]   Fax:  [phone] ? ?Occupational Therapy Treatment ? ?Patient Details  ?Name: Mary [name] ?MRN: [id number] ?Date of Birth: Apr 29, 1941 ?Referring Provider (OT): Pershing Cox, MD ? ? ?Encounter Date: 09/02/2021 ? ? OT End of Session - 09/02/21 1308   ? ? Visit Number 11   ? Number of Visits 36   ? Date for OT Re-Evaluation 10/29/21   ? OT Start Time 0100   ? OT Stop Time 0203   ? OT Time Calculation (min) 63 min   ? Equipment Utilized During Treatment loaned Rosidal foam and 2 short stretch compression wraps as Pt forgot hers today   ? Activity Tolerance Patient tolerated treatment well;No increased pain   ? Behavior During Therapy [facility] for tasks assessed/performed   ? ?  ?  ? ?  ? ? ?Past Medical History:  ?Diagnosis Date  ? Arrhythmia   ? Back pain   ? Depression with anxiety   ? Hip pain   ? Hyperlipidemia   ? Hypertension   ? Paroxysmal tachycardia ([place])   ? ? ?Past Surgical History:  ?Procedure Laterality Date  ? ABDOMINAL HYSTERECTOMY    ? BREAST BIOPSY    ? BREAST EXCISIONAL BIOPSY Left 2002  ? benign  ? CARDIAC CATHETERIZATION    ? 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  ? x2  ? TOTAL HIP ARTHROPLASTY Bilateral   ? done twice  ? ? ?There were no vitals filed for this visit. ? ? ? ? ? ? ? ? ? ? ? ? ? ? ? ? OT Treatments/Exercises (OP) - 09/02/21 1449   ? ?  ? ADLs  ? ADL Education Given Yes   ?  ? Manual Therapy  ? Manual Therapy Edema management;Manual Lymphatic Drainage (MLD);Compression Bandaging   ? Manual Lymphatic Drainage (MLD) MLD to LLE/LLQ MLD utilizing short neck sequence, functional L inguinal LN, and proximal to distal J strokes to thigh, knee, leg and foot. Good tolerance.   ? Compression Bandaging LLE multilayer knee length compression wraps using 1 each 8,10 and 12 cm wide short stretch bandages over a single layer of cotton stockinett and circumferentially applied single layer  of 0.4 cm thick Rosidal foam.   ? ?  ?  ? ?  ? ? ? ? ? ? ? ? ? OT Education - 09/02/21 1449   ? ? Education Details Continued Pt/ CG edu for lymphedema self care  and home program throughout session. Topics include multilayer, gradient compression wrapping, simple self-MLD, therapeutic lymphatic pumping exercises, skin/nail care, risk reduction factors and LE precautions, compression garments/recommendations and wear and care schedule and compression garment donning / doffing using assistive devices. All questions answered to the Pt's satisfaction. Pt demonstrates understanding by accurate return.   ? Person(s) Educated Patient   ? Methods Explanation;Demonstration;Handout   ? Comprehension Verbalized understanding;Returned demonstration;Need further instruction   ? ?  ?  ? ?  ? ? ? ? ? ? OT Long Term Goals - 08/31/21 1321   ? ?  ? OT LONG TERM GOAL #1  ? Title Given this patient?s low Intake score of 55/100 on the functional outcomes FOTO tool, patient will experience an increase in function of 2 points to improve basic and instrumental ADLs performance, including lymphedema self-care.   ? Baseline 55/100   ? Time 12   ? Period Weeks   ? Status On-going   ?  Target Date 10/29/21   ?  ? OT LONG TERM GOAL #2  ? Title Pt will demonstrate understanding of lymphedema prevention strategies by identifying and discussing 5 precautions using printed reference (modified assistance) to reduce risk of progression and to limit infection risk.   ? Baseline Max A   ? Time 4   ? Period Days   ? Status On-going   ? Target Date --   4th OT visit  ?  ? OT LONG TERM GOAL #3  ? Title Pt will be able to apply knee length, multi-layer, short stretch compression wraps to one limb at a time using gradient techniques with MODIFIED INDEPENDENCE (extra time) to decrease limb volume, to limit infection risk, and to limit lymphedema progression. 08/31/21: Goal revised:   "With Maximum CG assistance"   ? Baseline Max A   ? Time 4   ? Period Days    ? Status Partially Met   Patient's family member has learned to apply wraps using gradient techniques, however, limb is not consistently wrapped on a daily basis as is recommended for optimal volume reduction  ? Target Date --   4th OT visit  ?  ? OT LONG TERM GOAL #4  ? Title Pt will achieve at least a 10% limb volume reduction below the knee bilaterally to return limb to more normal size and shape, to limit infection risk, to decrease pain, to improve function, and to limit lymphedema progression.   ? Baseline Dependent   ? Time 12   ? Period Weeks   ? Status On-going   Zero limb volume reduction to date due to Pt not being able to wrap self, and limited CG availabliuty to learn to apply wraps initially, and currently to assist  with wraps daily as recomended  ? Target Date 10/29/21   ?  ? OT LONG TERM GOAL #5  ? Title Pt will achieve and sustain a least 85% compliance with all LE self-care home program components throughout Intensive Phase CDT, including daily skin inspection and care, lymphatic pumping ther ex, 23/7 compression wraps and simple self-MLD, to sustain clinical gains made in CDT and to limit lymphedema progression and further functional decline.   ? Baseline D   ? Time 12   ? Period Weeks   ? Status On-going   08/31/21: Estimate <25% compliance with home program to date  ? Target Date 10/29/21   ?  ? OT LONG TERM GOAL #6  ? Title Pt will be able to perform all lymphedema self-care home program components using correct techniques with extra time and assistive devices PRN (modified independence), including simple self MLD, lymphatic pumping exercise, don and doff appropriate compression garments/ devices with Max A, and perform daily skin care regime to limit lymphedema progression and infection risk.   ? Baseline D   ? Time 12   ? Period Weeks   ? Status On-going   ? Target Date 10/29/21   ? ?  ?  ? ?  ? ? ? ? ? ? ? ? Plan - 09/02/21 1419   ? ? Clinical Impression Statement Continued MLD to RLE/RLQ as  established without reported pain. Applied loaned wraps as establlished. Pt's questions answered throughout session to her satisfaction. Today is Pt's last visit for ~1.5 weeks as she is going on     beach vacation. Cont as per POC.   ? OT Occupational Profile and History Detailed Assessment- Review of Records and additional review of physical,  cognitive, psychosocial history related to current functional performance   ? Occupational performance deficits (Please refer to evaluation for details): ADL's;IADL's;Rest and Sleep;Leisure;Social Participation;Work   ? Body Structure / Function / Physical Skills ADL;ROM;IADL;Edema;Balance;Skin integrity;Mobility;Flexibility;Decreased knowledge of precautions;Decreased knowledge of use of DME   ? Rehab Potential Good   ? Clinical Decision Making Several treatment options, min-mod task modification necessary   ? Comorbidities Affecting Occupational Performance: Presence of comorbidities impacting occupational performance   ? Modification or Assistance to Complete Evaluation  Min-Moderate modification of tasks or assist with assess necessary to complete eval   ? OT Frequency 2x / week   ? OT Duration 12 weeks   ? OT Treatment/Interventions Self-care/ADL training;DME and/or AE instruction;Manual lymph drainage;Compression bandaging;Therapeutic activities;Coping strategies training;Therapeutic exercise;Patient/family education;Manual Therapy   ? Consulted and Agree with Plan of Care Patient   ? ?  ?  ? ?  ? ? ?Patient will benefit from skilled therapeutic intervention in order to improve the following deficits and impairments:   ?Body Structure / Function / Physical Skills: ADL, ROM, IADL, Edema, Balance, Skin integrity, Mobility, Flexibility, Decreased knowledge of precautions, Decreased knowledge of use of DME ?  ?  ? ? ?Visit Diagnosis: ?Lymphedema, not elsewhere classified ? ? ? ?Problem List ?Patient Active Problem List  ? Diagnosis Date Noted  ? Diastolic heart failure  (HCC) 07/30/2021  ? Hyperlipidemia 07/30/2021  ? Lymphedema 07/30/2021  ? Tachycardia 06/02/2021  ? Gait instability 06/02/2021  ? Primary hypertension 06/02/2021  ? ?Theresa Gilliam, MS, OTR/L, CLT-LANA

## 2021-09-02 NOTE — Therapy (Signed)
[place] ?[facility] ?[place], [place], [address] ?Phone: [phone]   Fax:  [phone] ? ?Physical Therapy Treatment/ Physical Therapy Progress Note ? ? ?Dates of reporting period  08/04/21   to   09/02/21  ? ?Patient Details  ?Name: Mary [name] ?MRN: [id number] ?Date of Birth: [birth year] ?Referring Provider (PT): Charlynne Cousins, MD ? ? ?Encounter Date: 09/02/2021 ? ? PT End of Session - 09/02/21 1448   ? ? Visit Number 10   ? Number of Visits 25   ? Date for PT Re-Evaluation 10/27/21   ? Authorization Type next session 1/10 PN 4/5 23   ? PT Start Time 1430   ? PT Stop Time 2353   ? PT Time Calculation (min) 44 min   ? Equipment Utilized During Treatment Gait belt   ? Activity Tolerance Patient tolerated treatment well   ? Behavior During Therapy [facility] for tasks assessed/performed   ? ?  ?  ? ?  ? ? ?Past Medical History:  ?Diagnosis Date  ? Arrhythmia   ? Back pain   ? Depression with anxiety   ? Hip pain   ? Hyperlipidemia   ? Hypertension   ? Paroxysmal tachycardia ([place])   ? ? ?Past Surgical History:  ?Procedure Laterality Date  ? ABDOMINAL HYSTERECTOMY    ? BREAST BIOPSY    ? BREAST EXCISIONAL BIOPSY Left 2002  ? benign  ? CARDIAC CATHETERIZATION    ? 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  ? x2  ? TOTAL HIP ARTHROPLASTY Bilateral   ? done twice  ? ? ?There were no vitals filed for this visit. ? ? Subjective Assessment - 09/02/21 1445   ? ? Subjective Patient's grandsons wedding is in the next few weeks, wants to continue working on walking down the aisle. No falls or LOB since last session. Will be going to the beach next week.   ? Pertinent History Pt is a pleasant 81 y/o female referred to PT for gait instability. Pt ambulates with RW. She reports onset of gait instability started several years ago. 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 Pt has had PT in past following surgeries. Pt  reports hx of frequent falls. She has had 2 falls in past 6 months. Pt believes falls are due to difficulty picking up L foot and dragging L foot. Pt reports no injury with falls.  Pt being seen by OT for lymphedema, reports LLE more affected than RLE. Pt currently lives alone. Her spouse passed away recently in 06-24-22. Her adult son and DIL live nearby and can assist her. Pt denies any dizziness or lightheadedness. Pt reports no pain currently. 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   ? Limitations Lifting;Standing;Walking;House hold activities   ? How long can you sit comfortably? not limited   ? How long can you stand comfortably? Pt reports only limited due to need to stretch lower back   ? How long can you walk comfortably? At least 30 minutes   ? Diagnostic tests per chart recent imaging (February) of hip/knee unremarkable.   ? Patient Stated Goals Pt would like to be able to walk without a cane or at least not have to use her RW.   ? Currently in Pain? No/denies   ? ?  ?  ? ?  ? ? ? ? ?  [facility] PT Assessment - 09/02/21 0001   ? ?  ? Standardized Balance Assessment  ? Standardized Balance Assessment Berg Balance Test   ?  ? Berg Balance Test  ? Sit to Stand Able to stand  independently using hands   ? Standing Unsupported Able to stand safely 2 minutes   ? Sitting with Back Unsupported but Feet Supported on Floor or Stool Able to sit safely and securely 2 minutes   ? Stand to Sit Controls descent by using hands   ? Transfers Able to transfer safely, definite need of hands   ? Standing Unsupported with Eyes Closed Able to stand 10 seconds safely   ? Standing Unsupported with Feet Together Able to place feet together independently and stand 1 minute safely   ? From Standing, Reach Forward with Outstretched Arm Can reach forward >12 cm safely (5")   ? From Standing Position, Pick up Object from [place] to pick up shoe safely and easily   ?  From Standing Position, Turn to Look Behind Over each Shoulder Looks behind from both sides and weight shifts well   ? Turn 360 Degrees Able to turn 360 degrees safely one side only in 4 seconds or less   ? Standing Unsupported, Alternately Place Feet on Step/Stool Able to complete >2 steps/needs minimal assist   ? Standing Unsupported, One Foot in Front Able to plae foot ahead of the other independently and hold 30 seconds   ? Standing on One Leg Tries to lift leg/unable to hold 3 seconds but remains standing independently   ? Total Score 44   ? ?  ?  ? ?  ? ? ? ?Goals:  ?FOTO: 55%  ?BERG: 44/56  ?5x STS: 9 seconds BUE support ; 9.6 seconds SUE support  ?ABC: 62%  ?10 MWT: 10.4 seconds with 4ww =0.96 m/s  ? ? ? ?Treatment:  ?Ambulate with [name] support from PT and CGA from PT to mimic carryover to wedding aisle.  ? ?Stand on airex pad, horizontal and vertical head turns scanning for letters for dual task cognitive word game x 4 minutes x 2 trials  ? ?Patient's condition has the potential to improve in response to therapy. Maximum improvement is yet to be obtained. The anticipated improvement is attainable and reasonable in a generally predictable time.  Patient reports she is improving with balance but still has room for progress.  ? ? ?Patient demonstrates excellent progression towards functional goals at this time. She has progressed her FOTO goal, ABC goal, and 10 MWT goal.  She has met her BERG goal and 5x STS goal. Her stability is improving significantly and she is able to ambulate with UE support to simulate ambulation down the aisle for her grandsons wedding. Patient's condition has the potential to improve in response to therapy. Maximum improvement is yet to be obtained. The anticipated improvement is attainable and reasonable in a generally predictable time.Patient will continue to benefit from skilled physical therapy intervention in order to improve her strength, balance, and  mobility ? ? ? ? ? ? ? ? ? ? ? ? ? PT Education - 09/02/21 1448   ? ? Education Details progress note, goals   ? Person(s) Educated Patient   ? Methods Explanation;Demonstration;Verbal cues;Tactile cues   ? Comprehension Verbalized understanding;Returned demonstration;Verbal cues required;Tactile cues required   ? ?  ?  ? ?  ? ? ? PT Short Term Goals - 09/02/21 1449   ? ?  ? PT  SHORT TERM GOAL #1  ? Title Pt will be independent with HEP in order to improve strength and balance in order to decrease fall risk and improve function at home and work.   ? Baseline 3/7: provided pt with initial HEP 4/5; compliant   ? Time 6   ? Period Weeks   ? Status Achieved   ? Target Date 09/15/21   ? ?  ?  ? ?  ? ? ? ? PT Long Term Goals - 09/02/21 1450   ? ?  ? PT LONG TERM GOAL #1  ? Title Patient will increase FOTO score to equal to or greater than 52 to demonstrate statistically significant improvement in mobility and quality of life.   ? Baseline 3/7: 50 4/5: 55%   ? Time 12   ? Period Weeks   ? Status Partially Met   ? Target Date 10/27/21   ?  ? PT LONG TERM GOAL #2  ? Title Pt will improve BERG by at least 3 points in order to demonstrate clinically significant improvement in balance.   ? Baseline 3/7: 40/56 4/5: 44/56   ? Time 12   ? Period Weeks   ? Status Achieved   ? Target Date 10/27/21   ?  ? PT LONG TERM GOAL #3  ? Title Pt will decrease 5TSTS by at least 3 seconds in order to demonstrate clinically significant improvement in LE strength.   ? Baseline 3/7: 15.36 sec with use of BUEs to assist 4/5: 9.6 seconds SUE support   ? Time 12   ? Period Weeks   ? Status Achieved   ? Target Date 10/27/21   ?  ? PT LONG TERM GOAL #4  ? Title Pt will improve ABC by at least 13% in order to demonstrate clinically significant improvement in balance confidence.   ? Baseline 3/7: 50.63% 4/5: 62%   ? Time 12   ? Period Weeks   ? Status Partially Met   ? Target Date 10/27/21   ?  ? PT LONG TERM GOAL #5  ? Title Patient will increase 10  meter walk test to >1.12ms as to improve gait speed for better community ambulation and to reduce fall risk.   ? Baseline 3/7: 0.75 m/s with 4WW 4/5: 0.96 m/s with 4 WW   ? Time 12   ? Period Weeks   ? Status Partially Met   ? [place]

## 2021-09-02 NOTE — Patient Instructions (Signed)

## 2021-09-03 ENCOUNTER — Encounter: Payer: Medicare Other | Admitting: Occupational Therapy

## 2021-09-04 ENCOUNTER — Other Ambulatory Visit: Payer: Self-pay | Admitting: Internal Medicine

## 2021-09-04 NOTE — Telephone Encounter (Signed)
Requested medication (s) are due for refill today: yes ? ?Requested medication (s) are on the active medication list: yes ? ?Last refill:  07/02/21 ? ?Future visit scheduled: yes ? ?Notes to clinic:  Unable to refill per protocol, cannot delegate.  ? ? ?  ?Requested Prescriptions  ?Pending Prescriptions Disp Refills  ? sertraline (ZOLOFT) 50 MG tablet [Pharmacy Med Name: SERTRALINE '50MG'$  TABLETS] 30 tablet 2  ?  Sig: TAKE 1 TABLET(50 MG) BY MOUTH DAILY  ?  ? Not Delegated - Psychiatry:  Antidepressants - SSRI - sertraline Failed - 09/04/2021  8:00 AM  ?  ?  Failed - This refill cannot be delegated  ?  ?  Passed - AST in normal range and within 360 days  ?  AST  ?Date Value Ref Range Status  ?06/17/2021 16 0 - 40 IU/L Final  ?  ?  ?  ?  Passed - ALT in normal range and within 360 days  ?  ALT  ?Date Value Ref Range Status  ?06/17/2021 13 0 - 32 IU/L Final  ?  ?  ?  ?  Passed - Completed PHQ-2 or PHQ-9 in the last 360 days  ?  ?  Passed - Valid encounter within last 6 months  ?  Recent Outpatient Visits   ? ?      ? 1 month ago Diastolic heart failure, unspecified HF chronicity ([place])  ? [place], MD  ? 1 month ago COVID-19  ? [place], NP  ? 2 months ago Acute pain of left knee  ? [facility] Vigg, Avanti, MD  ? 3 months ago Acute diastolic heart failure ([place])  ? [facility] Vigg, Avanti, MD  ? ?  ?  ?Future Appointments   ? ?        ? In 2 months  [facility]  ? In 3 months Wynetta Emery, Barb Merino, DO [facility], [facility]  ? ?  ? ?  ?  ?  ? ? ?

## 2021-09-08 ENCOUNTER — Encounter: Payer: Medicare Other | Admitting: Occupational Therapy

## 2021-09-09 ENCOUNTER — Ambulatory Visit: Payer: Medicare Other

## 2021-09-09 ENCOUNTER — Encounter: Payer: Medicare Other | Admitting: Occupational Therapy

## 2021-09-11 ENCOUNTER — Encounter: Payer: Medicare Other | Admitting: Occupational Therapy

## 2021-09-14 ENCOUNTER — Ambulatory Visit: Payer: Medicare Other

## 2021-09-14 DIAGNOSIS — M6281 Muscle weakness (generalized): Secondary | ICD-10-CM

## 2021-09-14 DIAGNOSIS — R2681 Unsteadiness on feet: Secondary | ICD-10-CM

## 2021-09-14 DIAGNOSIS — R278 Other lack of coordination: Secondary | ICD-10-CM

## 2021-09-14 DIAGNOSIS — R2689 Other abnormalities of gait and mobility: Secondary | ICD-10-CM

## 2021-09-14 DIAGNOSIS — I890 Lymphedema, not elsewhere classified: Secondary | ICD-10-CM | POA: Diagnosis not present

## 2021-09-14 NOTE — Therapy (Signed)
[facility] ?[place], [place], [address] ?Phone: [phone]   Fax:  [phone] ? ?Physical Therapy Treatment ? ?Patient Details  ?Name: Mary [name] ?MRN: [id number] ?Date of Birth: [birth year] ?Referring Provider (PT): Charlynne Cousins, MD ? ? ?Encounter Date: 09/14/2021 ? ? PT End of Session - 09/14/21 1106   ? ? Visit Number 11   ? Number of Visits 25   ? Date for PT Re-Evaluation 10/27/21   ? Authorization Type Medicare Primary, A[employer] Secondary   ? Authorization Time Period 08/04/21-10/27/21   ? PT Start Time 1102   ? PT Stop Time 0160   ? PT Time Calculation (min) 40 min   ? Equipment Utilized During Treatment Gait belt   ? Activity Tolerance Patient tolerated treatment well;No increased pain   ? Behavior During Therapy [facility] for tasks assessed/performed   ? ?  ?  ? ?  ? ? ?Past Medical History:  ?Diagnosis Date  ? Arrhythmia   ? Back pain   ? Depression with anxiety   ? Hip pain   ? Hyperlipidemia   ? Hypertension   ? Paroxysmal tachycardia ([place])   ? ? ?Past Surgical History:  ?Procedure Laterality Date  ? ABDOMINAL HYSTERECTOMY    ? BREAST BIOPSY    ? BREAST EXCISIONAL BIOPSY Left 2000-07-19  ? benign  ? CARDIAC CATHETERIZATION    ? 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  ? x2  ? TOTAL HIP ARTHROPLASTY Bilateral   ? done twice  ? ? ?There were no vitals filed for this visit. ? ? Subjective Assessment - 09/14/21 1103   ? ? Subjective Pt doing well today, no updates since prior visit. Pt at beach last week.   ? Pertinent History Pt is a pleasant 81 y/o female referred to PT for gait instability. Pt ambulates with RW. She reports onset of gait instability started several years ago. 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 Pt has had PT in past following surgeries. Pt reports hx of frequent falls. She has had 2 falls in past 6 months. Pt believes falls are due to difficulty picking up L  foot and dragging L foot. Pt reports no injury with falls.  Pt being seen by OT for lymphedema, reports LLE more affected than RLE. Pt currently lives alone. Her spouse passed away recently in 07-19-2022. Her adult son and DIL live nearby and can assist her. Pt denies any dizziness or lightheadedness. Pt reports no pain currently. 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   ? Currently in Pain? No/denies   ? ?  ?  ? ?  ? ? ?INTERVENTION THIS DATE:  ? ?Seated: ?GTB abduction 15x ?GTB around bilateral ankles: alternating LAQ 15x ?RTB knee flexion 1x15 ?Yellow Kickball squeezes 10x3secH  ? ?STS frank chair+airex, hands free  ?RTB side stepping in // bars 3 laps (subjectively much harder than last session) ? ?Side stepping in //bars 3 RT  ?Retro stepping in //bars 3RT  ? ?AMB circles around Nustep 4x CW, 4xCCW x SPC  ?*education on how [facility] is helping with both safety and movement economy ?*self selected SPC c pattern, no need to modify at this time ? ? ? PT Education - 09/14/21 1107   ? ? Education Details Form for exercises   ? Person(s)  Educated Patient   ? Methods Explanation;Demonstration   ? Comprehension Verbalized understanding   ? ?  ?  ? ?  ? ? ? PT Short Term Goals - 09/02/21 1449   ? ?  ? PT SHORT TERM GOAL #1  ? Title Pt will be independent with HEP in order to improve strength and balance in order to decrease fall risk and improve function at home and work.   ? Baseline 3/7: provided pt with initial HEP 4/5; compliant   ? Time 6   ? Period Weeks   ? Status Achieved   ? Target Date 09/15/21   ? ?  ?  ? ?  ? ? ? ? PT Long Term Goals - 09/02/21 1450   ? ?  ? PT LONG TERM GOAL #1  ? Title Patient will increase FOTO score to equal to or greater than 52 to demonstrate statistically significant improvement in mobility and quality of life.   ? Baseline 3/7: 50 4/5: 55%   ? Time 12   ? Period Weeks   ? Status Partially Met   ? Target Date 10/27/21   ?   ? PT LONG TERM GOAL #2  ? Title Pt will improve BERG by at least 3 points in order to demonstrate clinically significant improvement in balance.   ? Baseline 3/7: 40/56 4/5: 44/56   ? Time 12   ? Period Weeks   ? Status Achieved   ? Target Date 10/27/21   ?  ? PT LONG TERM GOAL #3  ? Title Pt will decrease 5TSTS by at least 3 seconds in order to demonstrate clinically significant improvement in LE strength.   ? Baseline 3/7: 15.36 sec with use of BUEs to assist 4/5: 9.6 seconds SUE support   ? Time 12   ? Period Weeks   ? Status Achieved   ? Target Date 10/27/21   ?  ? PT LONG TERM GOAL #4  ? Title Pt will improve ABC by at least 13% in order to demonstrate clinically significant improvement in balance confidence.   ? Baseline 3/7: 50.63% 4/5: 62%   ? Time 12   ? Period Weeks   ? Status Partially Met   ? Target Date 10/27/21   ?  ? PT LONG TERM GOAL #5  ? Title Patient will increase 10 meter walk test to >1.33ms as to improve gait speed for better community ambulation and to reduce fall risk.   ? Baseline 3/7: 0.75 m/s with 4WW 4/5: 0.96 m/s with 4 WW   ? Time 12   ? Period Weeks   ? Status Partially Met   ? Target Date 10/27/21   ? ?  ?  ? ?  ? ? ? ? ? ? ? ? Plan - 09/14/21 1112   ? ? Clinical Impression Statement Conitnued with BLE strength and gait stability training. Lateral and retro stepping movement remain heavily compensated and multifactorial but well anticipated and controlled. Practiced motor control in gait stepping in bars, hands free, and overground with SPC in preparation for wedding ceremony.   ? Personal Factors and Comorbidities Age;Past/Current Experience;Time since onset of injury/illness/exacerbation;Fitness;Comorbidity 3+   ? 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 Examination-Activity Limitations Bend;Reach Overhead;Stairs;Transfers;Lift;Squat;Locomotion Level;Carry;Stand;Dressing   ? Examination-Participation Restrictions  Volunteer;Community Activity;Shop;Cleaning;Laundry;[employer] LeaverWork   ? Stability/Clinical Decision Making Evolving/Moderate complexity   ? Clinical Decision Making Moderate   ? Rehab  Potential Good   ? PT Frequency 2x / week   ? PT Duration 12 weeks   ? PT Treatment/Interventions ADLs/Self Care Home Management;Aquatic Therapy;Biofeedback;Canalith Repostioning;Cryotherapy;Electrical Stimulation;Moist Heat;Traction;Ultrasound;DME Instruction;Gait training;Functional Biochemist, clinical;Therapeutic activities;Therapeutic exercise;Balance training;Neuromuscular re-education;Patient/family education;Orthotic Fit/Training;Wheelchair mobility training;Manual techniques;Passive range of motion;Scar mobilization;Dry needling;Energy conservation;Splinting;Taping;Vestibular;Visual/perceptual remediation/compensation;Joint Manipulations   ? PT Next Visit Plan strengthening, gait, balance, introduce to well zone as appropriate.   ? PT Home Exercise Plan Access Code: [email or web address]   ? Consulted and Agree with Plan of Care Patient   ? ?  ?  ? ?  ? ? ?Patient will benefit from skilled therapeutic intervention in order to improve the following deficits and impairments:  Abnormal gait, Decreased balance, Decreased endurance, Decreased mobility, Difficulty walking, Hypomobility, Decreased range of motion, Improper body mechanics, Decreased activity tolerance, Decreased coordination, Decreased strength, Postural dysfunction ? ?Visit Diagnosis: ?Unsteadiness on feet ? ?Other abnormalities of gait and mobility ? ?Muscle weakness (generalized) ? ?Other lack of coordination ? ? ? ? ?Problem List ?Patient Active Problem List  ? Diagnosis Date Noted  ? Diastolic heart failure ([place]) 07/30/2021  ? Hyperlipidemia 07/30/2021  ? Lymphedema 07/30/2021  ? Tachycardia 06/02/2021  ? Gait instability 06/02/2021  ? Primary hypertension 06/02/2021  ? ? ?11:40 AM, 09/14/21 ?Etta Grandchild, PT, DPT ?Physical Therapist - [place] ?[facility]  ?Outpatient Physical Therapy- [place] ?[phone]   ? ? ?Buccola,Allan C, PT ?09/14/2021, 11:24 AM ? ?[facility] ?[place]

## 2021-09-15 ENCOUNTER — Ambulatory Visit: Payer: Medicare Other | Admitting: Occupational Therapy

## 2021-09-15 DIAGNOSIS — I890 Lymphedema, not elsewhere classified: Secondary | ICD-10-CM | POA: Diagnosis not present

## 2021-09-15 NOTE — Therapy (Signed)
[place] [place] [facility] ?[place], [place], [address] ?Phone: [phone]   Fax:  [phone] ? ?Occupational Therapy Treatment ? ?Patient Details  ?Name: Mary [name] ?MRN: [id number] ?Date of Birth: April 12, 1941 ?Referring Provider (OT): Pershing Cox, MD ? ? ?Encounter Date: 09/15/2021 ? ? OT End of Session - 09/15/21 1011   ? ? Visit Number 12   ? Number of Visits 36   ? Date for OT Re-Evaluation 10/29/21   ? OT Start Time 1002   ? OT Stop Time 1105   ? OT Time Calculation (min) 63 min   ? Equipment Utilized During Treatment loaned Rosidal foam and 2 short stretch compression wraps as Pt forgot hers today   ? Activity Tolerance Patient tolerated treatment well;No increased pain   ? Behavior During Therapy [facility] for tasks assessed/performed   ? ?  ?  ? ?  ? ? ?Past Medical History:  ?Diagnosis Date  ? Arrhythmia   ? Back pain   ? Depression with anxiety   ? Hip pain   ? Hyperlipidemia   ? Hypertension   ? Paroxysmal tachycardia ([place])   ? ? ?Past Surgical History:  ?Procedure Laterality Date  ? ABDOMINAL HYSTERECTOMY    ? BREAST BIOPSY    ? BREAST EXCISIONAL BIOPSY Left 2002  ? benign  ? CARDIAC CATHETERIZATION    ? 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  ? x2  ? TOTAL HIP ARTHROPLASTY Bilateral   ? done twice  ? ? ?There were no vitals filed for this visit. ? ? Subjective Assessment - 09/15/21 1012   ? ? Subjective  Otis Burress presents for OT Rx vist 11/36 to address BLE lymphedema. Pt was last seen for OT Lymphedema care a week and a half ago. She enjoyed her family beach trip. She denies LE-related leg pain this morning. She reports she was wrapped last night.   ? Pertinent History HTN, B TKA x 2, B THA x2, Diastolic heart failure, back pain, tachycardia   ? Limitations difficulty walking, decreased standing tolerance, impaired transfers and functional mobility, impaired LB dressing, Limited L hip A/PROM, impaired dynamic balance, difficulty w/ LB dressing-fitting shoes,  socks, pants   ? Repetition Increases Symptoms   ? Special Tests Intake FOTO score = 55/100; + [place]   ? Patient Stated Goals get leg swelling under control; I want to be able to get out and walk   ? Currently in Pain? No/denies   ? Pain Onset Other (comment)   ? ?  ?  ? ?  ? ? ? ? ? ? ? ? ? ? ? ? ? ? ? OT Treatments/Exercises (OP) - 09/15/21 1147   ? ?  ? ADLs  ? ADL Education Given Yes   ?  ? Manual Therapy  ? Manual Therapy Edema management;Manual Lymphatic Drainage (MLD);Compression Bandaging   ? Manual Lymphatic Drainage (MLD) MLD to LLE/LLQ MLD utilizing short neck sequence, functional L inguinal LN, and proximal to distal J strokes to thigh, knee, leg and foot. Good tolerance.   ? Compression Bandaging LLE multilayer knee length compression wraps using 1 each 8,10 and 12 cm wide short stretch bandages over a single layer of cotton stockinett and circumferentially applied single layer of 0.4 cm thick Rosidal foam.   ? ?  ?  ? ?  ? ? ? ? ? ? ? ? ? OT Education - 09/15/21 1014   ? ? Education Details Continued Pt/ CG edu for  lymphedema self care  and home program throughout session. Topics include multilayer, gradient compression wrapping, simple self-MLD, therapeutic lymphatic pumping exercises, skin/nail care, risk reduction factors and LE precautions, compression garments/recommendations and wear and care schedule and compression garment donning / doffing using assistive devices. All questions answered to the Pt's satisfaction. Pt demonstrates understanding by accurate return.   ? Person(s) Educated Patient   ? Methods Explanation;Demonstration;Handout   ? Comprehension Verbalized understanding;Returned demonstration;Need further instruction   ? ?  ?  ? ?  ? ? ? ? ? ? OT Long Term Goals - 08/31/21 1321   ? ?  ? OT LONG TERM GOAL #1  ? Title Given this patient?s low Intake score of 55/100 on the functional outcomes FOTO tool, patient will experience an increase in function of 2 points to improve basic and  instrumental ADLs performance, including lymphedema self-care.   ? Baseline 55/100   ? Time 12   ? Period Weeks   ? Status On-going   ? Target Date 10/29/21   ?  ? OT LONG TERM GOAL #2  ? Title Pt will demonstrate understanding of lymphedema prevention strategies by identifying and discussing 5 precautions using printed reference (modified assistance) to reduce risk of progression and to limit infection risk.   ? Baseline Max A   ? Time 4   ? Period Days   ? Status On-going   ? Target Date --   4th OT visit  ?  ? OT LONG TERM GOAL #3  ? Title Pt will be able to apply knee length, multi-layer, short stretch compression wraps to one limb at a time using gradient techniques with MODIFIED INDEPENDENCE (extra time) to decrease limb volume, to limit infection risk, and to limit lymphedema progression. 08/31/21: Goal revised:   "With Maximum CG assistance"   ? Baseline Max A   ? Time 4   ? Period Days   ? Status Partially Met   Patient's family member has learned to apply wraps using gradient techniques, however, limb is not consistently wrapped on a daily basis as is recommended for optimal volume reduction  ? Target Date --   4th OT visit  ?  ? OT LONG TERM GOAL #4  ? Title Pt will achieve at least a 10% limb volume reduction below the knee bilaterally to return limb to more normal size and shape, to limit infection risk, to decrease pain, to improve function, and to limit lymphedema progression.   ? Baseline Dependent   ? Time 12   ? Period Weeks   ? Status On-going   Zero limb volume reduction to date due to Pt not being able to wrap self, and limited CG availabliuty to learn to apply wraps initially, and currently to assist  with wraps daily as recomended  ? Target Date 10/29/21   ?  ? OT LONG TERM GOAL #5  ? Title Pt will achieve and sustain a least 85% compliance with all LE self-care home program components throughout Intensive Phase CDT, including daily skin inspection and care, lymphatic pumping ther ex, 23/7  compression wraps and simple self-MLD, to sustain clinical gains made in CDT and to limit lymphedema progression and further functional decline.   ? Baseline D   ? Time 12   ? Period Weeks   ? Status On-going   08/31/21: Estimate <25% compliance with home program to date  ? Target Date 10/29/21   ?  ? OT LONG TERM GOAL #6  ? Title  Pt will be able to perform all lymphedema self-care home program components using correct techniques with extra time and assistive devices PRN (modified independence), including simple self MLD, lymphatic pumping exercise, don and doff appropriate compression garments/ devices with Max A, and perform daily skin care regime to limit lymphedema progression and infection risk.   ? Baseline D   ? Time 12   ? Period Weeks   ? Status On-going   ? Target Date 10/29/21   ? ?  ?  ? ?  ? ? ? ? ? ? ? ? Plan - 09/15/21 1142   ? ? Clinical Impression Statement Pt returns today after a week and a half break for family vacation without using compression wraps. She used wraps yesterday and also her lymphapress to reduce swelling before returning to OT. Her LLE swelling appears well managed., and It's admirable that she used her current tools and strategies to improve condition before returning to clinic.. We continued MLD to RLE/RLQ as established, which continues to be well tolerated and pain free. Skin care to R leg and ankle throughout MLD to ensure optimal hydration to limit infection.  Applied wraps as establlished. Pt's questions answered throughout session to her satisfaction. Her DIL continues to assist during visit intervals with wrapping, but this remains somewhat inconsistent. Cont as per POC.   ? OT Occupational Profile and History Detailed Assessment- Review of Records and additional review of physical, cognitive, psychosocial history related to current functional performance   ? Occupational performance deficits (Please refer to evaluation for details): ADL's;IADL's;Rest and  Sleep;Leisure;Social Participation;Work   ? Body Structure / Function / Physical Skills ADL;ROM;IADL;Edema;Balance;Skin integrity;Mobility;Flexibility;Decreased knowledge of precautions;Decreased knowledge of use of DME   ? R

## 2021-09-15 NOTE — Patient Instructions (Signed)

## 2021-09-16 ENCOUNTER — Ambulatory Visit: Payer: Medicare Other | Admitting: Occupational Therapy

## 2021-09-16 ENCOUNTER — Ambulatory Visit: Payer: Medicare Other

## 2021-09-16 DIAGNOSIS — M6281 Muscle weakness (generalized): Secondary | ICD-10-CM

## 2021-09-16 DIAGNOSIS — I890 Lymphedema, not elsewhere classified: Secondary | ICD-10-CM

## 2021-09-16 DIAGNOSIS — R278 Other lack of coordination: Secondary | ICD-10-CM

## 2021-09-16 DIAGNOSIS — R2689 Other abnormalities of gait and mobility: Secondary | ICD-10-CM

## 2021-09-16 DIAGNOSIS — R2681 Unsteadiness on feet: Secondary | ICD-10-CM

## 2021-09-16 NOTE — Therapy (Signed)
[place] [facility] ?[place], [place], [address] ?Phone: [phone]   Fax:  [phone] ? ?Physical Therapy Treatment ? ?Patient Details  ?Name: Mary [name] ?MRN: [id number] ?Date of Birth: [birth year] ?Referring Provider (PT): Charlynne Cousins, MD ? ? ?Encounter Date: 09/16/2021 ? ? PT End of Session - 09/16/21 1354   ? ? Visit Number 12   ? Number of Visits 25   ? Date for PT Re-Evaluation 10/27/21   ? Authorization Type Medicare Primary, A[employer] Secondary   ? Authorization Time Period 08/04/21-10/27/21   ? Progress Note Due on Visit 20   ? PT Start Time 1348   ? PT Stop Time 1428   ? PT Time Calculation (min) 40 min   ? Equipment Utilized During Treatment Gait belt   ? Activity Tolerance Patient tolerated treatment well;No increased pain   ? Behavior During Therapy [facility] for tasks assessed/performed   ? ?  ?  ? ?  ? ? ?Past Medical History:  ?Diagnosis Date  ? Arrhythmia   ? Back pain   ? Depression with anxiety   ? Hip pain   ? Hyperlipidemia   ? Hypertension   ? Paroxysmal tachycardia ([place])   ? ? ?Past Surgical History:  ?Procedure Laterality Date  ? ABDOMINAL HYSTERECTOMY    ? BREAST BIOPSY    ? BREAST EXCISIONAL BIOPSY Left 2002  ? benign  ? CARDIAC CATHETERIZATION    ? 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  ? x2  ? TOTAL HIP ARTHROPLASTY Bilateral   ? done twice  ? ? ?There were no vitals filed for this visit. ? ? Subjective Assessment - 09/16/21 1352   ? ? Subjective Pt doing well today. She brings her cane for inspection/adjustment. Her grandson's wedding is coming up this weekend.   ? Pertinent History Pt is a pleasant 81 y/o female referred to PT for gait instability. Pt ambulates with RW. She reports onset of gait instability started several years ago. 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 Pt has had PT in past following surgeries. Pt reports hx of frequent falls. She has had 2  falls in past 6 months. Pt believes falls are due to difficulty picking up L foot and dragging L foot. Pt reports no injury with falls.  Pt being seen by OT for lymphedema, reports LLE more affected than RLE. Pt currently lives alone. Her spouse passed away recently in 06-15-22. Her adult son and DIL live nearby and can assist her. Pt denies any dizziness or lightheadedness. Pt reports no pain currently. 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   ? Currently in Pain? No/denies   ? ?  ?  ? ?  ? ? ?INTERVENTION  ?-AMB overground AMB c SPC adjustment 131f  ?-AMB overground c SPC fig 8s 6x  ?-AMB overground c SPC lateral side stepping 2x251fbilat  ? ?-STS from chair + airex hands free 2x10  ? ?-GreenTB calms 15x ?-Yellow Kickball Squeeze 10x3sec H  ?-LAQ 1x12 bilat 2.5lb AW  ? ? ? ? PT Education - 09/16/21 1401   ? ? Education Details Use of SPC   ? Person(s) Educated Patient   ? Methods Explanation   ? Comprehension Verbalized understanding   ? ?  ?  ? ?  ? ? ? PT  Short Term Goals - 09/02/21 1449   ? ?  ? PT SHORT TERM GOAL #1  ? Title Pt will be independent with HEP in order to improve strength and balance in order to decrease fall risk and improve function at home and work.   ? Baseline 3/7: provided pt with initial HEP 4/5; compliant   ? Time 6   ? Period Weeks   ? Status Achieved   ? Target Date 09/15/21   ? ?  ?  ? ?  ? ? ? ? PT Long Term Goals - 09/02/21 1450   ? ?  ? PT LONG TERM GOAL #1  ? Title Patient will increase FOTO score to equal to or greater than 52 to demonstrate statistically significant improvement in mobility and quality of life.   ? Baseline 3/7: 50 4/5: 55%   ? Time 12   ? Period Weeks   ? Status Partially Met   ? Target Date 10/27/21   ?  ? PT LONG TERM GOAL #2  ? Title Pt will improve BERG by at least 3 points in order to demonstrate clinically significant improvement in balance.   ? Baseline 3/7: 40/56 4/5: 44/56   ? Time 12    ? Period Weeks   ? Status Achieved   ? Target Date 10/27/21   ?  ? PT LONG TERM GOAL #3  ? Title Pt will decrease 5TSTS by at least 3 seconds in order to demonstrate clinically significant improvement in LE strength.   ? Baseline 3/7: 15.36 sec with use of BUEs to assist 4/5: 9.6 seconds SUE support   ? Time 12   ? Period Weeks   ? Status Achieved   ? Target Date 10/27/21   ?  ? PT LONG TERM GOAL #4  ? Title Pt will improve ABC by at least 13% in order to demonstrate clinically significant improvement in balance confidence.   ? Baseline 3/7: 50.63% 4/5: 62%   ? Time 12   ? Period Weeks   ? Status Partially Met   ? Target Date 10/27/21   ?  ? PT LONG TERM GOAL #5  ? Title Patient will increase 10 meter walk test to >1.51ms as to improve gait speed for better community ambulation and to reduce fall risk.   ? Baseline 3/7: 0.75 m/s with 4WW 4/5: 0.96 m/s with 4 WW   ? Time 12   ? Period Weeks   ? Status Partially Met   ? Target Date 10/27/21   ? ?  ?  ? ?  ? ? ? ? ? ? ? ? Plan - 09/16/21 1402   ? ? Clinical Impression Statement Continued with gait training, practice with SPC today in preparation for wedding this weekend. Pt does well with sequencing in multi directional walking and turning. Pt continues to make excellent progress toward goals of treatment.   ? Personal Factors and Comorbidities Age;Past/Current Experience;Time since onset of injury/illness/exacerbation;Fitness;Comorbidity 3+   ? 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 Examination-Activity Limitations Bend;Reach Overhead;Stairs;Transfers;Lift;Squat;Locomotion Level;Carry;Stand;Dressing   ? Examination-Participation Restrictions Volunteer;Community Activity;Shop;Cleaning;Laundry;Y[employer]alla LeaverWork   ? Stability/Clinical Decision Making Evolving/Moderate complexity   ? Clinical Decision Making Moderate   ? Rehab Potential Good   ? PT Frequency 2x / week   ? PT Duration 12 weeks   ? PT  Treatment/Interventions ADLs/Self Care Home Management;Aquatic Therapy;Biofeedback;Canalith Repostioning;Cryotherapy;Electrical Stimulation;Moist Heat;Traction;Ultrasound;DME Instruction;Gait training;Functional mobility training;Stair  training;Therapeutic activities;Therapeutic exercise;Balance training;Neuromuscular re-education;Patient/family education;Orthotic Fit/Training;Wheelchair mobility training;Manual techniques;Passive range of motion;Scar mobilization;Dry needling;Energy conservation;Splinting;Taping;Vestibular;Visual/perceptual remediation/compensation;Joint Manipulations   ? PT Next Visit Plan strengthening, gait, balance, introduce to well zone as appropriate.   ? PT Home Exercise Plan Access Code: [email or web address]   ? Consulted and Agree with Plan of Care Patient   ? ?  ?  ? ?  ? ? ?Patient will benefit from skilled therapeutic intervention in order to improve the following deficits and impairments:  Abnormal gait, Decreased balance, Decreased endurance, Decreased mobility, Difficulty walking, Hypomobility, Decreased range of motion, Improper body mechanics, Decreased activity tolerance, Decreased coordination, Decreased strength, Postural dysfunction ? ?Visit Diagnosis: ?Lymphedema, not elsewhere classified ? ?Unsteadiness on feet ? ?Other abnormalities of gait and mobility ? ?Muscle weakness (generalized) ? ?Other lack of coordination ? ? ? ? ?Problem List ?Patient Active Problem List  ? Diagnosis Date Noted  ? Diastolic heart failure ([place]) 07/30/2021  ? Hyperlipidemia 07/30/2021  ? Lymphedema 07/30/2021  ? Tachycardia 06/02/2021  ? Gait instability 06/02/2021  ? Primary hypertension 06/02/2021  ? ?2:29 PM, 09/16/21 ?Etta Grandchild, PT, DPT ?Physical Therapist - [place] ?[facility]  ?Outpatient Physical Therapy- [place] ?[phone]   ? ? ?Jag Lenz C, PT ?09/16/2021, 2:06 PM ? ?[place] [facility] ?[place], [place], [address] ?Phone: [phone]   Fax:  [phone] ? ?Name: Mary [name] ?MRN: [id number] ?Date of Birth: February 21, 1941 ? ? ? ?

## 2021-09-18 ENCOUNTER — Ambulatory Visit: Payer: Medicare Other

## 2021-09-21 ENCOUNTER — Ambulatory Visit: Payer: Medicare Other | Admitting: Physical Therapy

## 2021-09-21 ENCOUNTER — Encounter: Payer: Self-pay | Admitting: Physical Therapy

## 2021-09-21 ENCOUNTER — Ambulatory Visit: Payer: Medicare Other | Admitting: Occupational Therapy

## 2021-09-21 DIAGNOSIS — R2689 Other abnormalities of gait and mobility: Secondary | ICD-10-CM

## 2021-09-21 DIAGNOSIS — I890 Lymphedema, not elsewhere classified: Secondary | ICD-10-CM | POA: Diagnosis not present

## 2021-09-21 DIAGNOSIS — M6281 Muscle weakness (generalized): Secondary | ICD-10-CM

## 2021-09-21 DIAGNOSIS — R2681 Unsteadiness on feet: Secondary | ICD-10-CM

## 2021-09-21 NOTE — Therapy (Signed)
[place] [facility] ?[place], [place], [address] ?Phone: [phone]   Fax:  [phone] ? ?Occupational Therapy Treatment ? ?Patient Details  ?Name: Mary [name] ?MRN: [id number] ?Date of Birth: 06-07-1940 ?Referring Provider (OT): Pershing Cox, MD ? ? ?Encounter Date: 09/21/2021 ? ? OT End of Session - 09/21/21 1214   ? ? Visit Number 13   ? Number of Visits 36   ? Date for OT Re-Evaluation 10/29/21   ? OT Start Time 1100   ? OT Stop Time 1200   ? OT Time Calculation (min) 60 min   ? Equipment Utilized During Treatment loaned Rosidal foam and 2 short stretch compression wraps as Pt forgot hers today   ? Activity Tolerance Patient tolerated treatment well;No increased pain   ? Behavior During Therapy [facility] for tasks assessed/performed   ? ?  ?  ? ?  ? ? ?Past Medical History:  ?Diagnosis Date  ? Arrhythmia   ? Back pain   ? Depression with anxiety   ? Hip pain   ? Hyperlipidemia   ? Hypertension   ? Paroxysmal tachycardia ([place])   ? ? ?Past Surgical History:  ?Procedure Laterality Date  ? ABDOMINAL HYSTERECTOMY    ? BREAST BIOPSY    ? BREAST EXCISIONAL BIOPSY Left 2002  ? benign  ? CARDIAC CATHETERIZATION    ? 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  ? x2  ? TOTAL HIP ARTHROPLASTY Bilateral   ? done twice  ? ? ?There were no vitals filed for this visit. ? ? Subjective Assessment - 09/21/21 1212   ? ? Subjective  Mary [name] presents for OT Rx vist 12/36 to address BLE lymphedema. She denies LE-related leg pain this morning. Pt has been unwrapped since last OT visit as she was away for a family wedding. Pt denies new complaints.   ? Pertinent History HTN, B TKA x 2, B THA x2, Diastolic heart failure, back pain, tachycardia   ? Limitations difficulty walking, decreased standing tolerance, impaired transfers and functional mobility, impaired LB dressing, Limited L hip A/PROM, impaired dynamic balance, difficulty w/ LB dressing-fitting shoes, socks, pants   ? Repetition  Increases Symptoms   ? Special Tests Intake FOTO score = 55/100; + [place]   ? Patient Stated Goals get leg swelling under control; I want to be able to get out and walk   ? Currently in Pain? No/denies   ? Pain Onset Other (comment)   ? ?  ?  ? ?  ? ? ? ? ? ? ? ? ? ? ? ? ? ? ? OT Treatments/Exercises (OP) - 09/21/21 1217   ? ?  ? ADLs  ? ADL Education Given Yes   ?  ? Manual Therapy  ? Manual Therapy Edema management;Manual Lymphatic Drainage (MLD);Compression Bandaging   ? Manual Lymphatic Drainage (MLD) MLD to LLE/LLQ MLD utilizing short neck sequence, functional L inguinal LN, and proximal to distal J strokes to thigh, knee, leg and foot. Good tolerance.   ? Compression Bandaging LLE multilayer knee length compression wraps using 1 each 8,10 and 12 cm wide short stretch bandages over a single layer of cotton stockinett and circumferentially applied single layer of 0.4 cm thick Rosidal foam.   ? ?  ?  ? ?  ? ? ? ? ? ? ? ? ? OT Education - 09/21/21 1218   ? ? Education Details Continued Pt/ CG edu for lymphedema self care  and home program  throughout session. Topics include multilayer, gradient compression wrapping, simple self-MLD, therapeutic lymphatic pumping exercises, skin/nail care, risk reduction factors and LE precautions, compression garments/recommendations and wear and care schedule and compression garment donning / doffing using assistive devices. All questions answered to the Pt's satisfaction. Pt demonstrates understanding by accurate return.   ? Person(s) Educated Patient   ? Methods Explanation;Demonstration;Handout   ? Comprehension Verbalized understanding;Returned demonstration;Need further instruction   ? ?  ?  ? ?  ? ? ? ? ? ? OT Long Term Goals - 08/31/21 1321   ? ?  ? OT LONG TERM GOAL #1  ? Title Given this patient?s low Intake score of 55/100 on the functional outcomes FOTO tool, patient will experience an increase in function of 2 points to improve basic and instrumental ADLs performance,  including lymphedema self-care.   ? Baseline 55/100   ? Time 12   ? Period Weeks   ? Status On-going   ? Target Date 10/29/21   ?  ? OT LONG TERM GOAL #2  ? Title Pt will demonstrate understanding of lymphedema prevention strategies by identifying and discussing 5 precautions using printed reference (modified assistance) to reduce risk of progression and to limit infection risk.   ? Baseline Max A   ? Time 4   ? Period Days   ? Status On-going   ? Target Date --   4th OT visit  ?  ? OT LONG TERM GOAL #3  ? Title Pt will be able to apply knee length, multi-layer, short stretch compression wraps to one limb at a time using gradient techniques with MODIFIED INDEPENDENCE (extra time) to decrease limb volume, to limit infection risk, and to limit lymphedema progression. 08/31/21: Goal revised:   "With Maximum CG assistance"   ? Baseline Max A   ? Time 4   ? Period Days   ? Status Partially Met   Patient's family member has learned to apply wraps using gradient techniques, however, limb is not consistently wrapped on a daily basis as is recommended for optimal volume reduction  ? Target Date --   4th OT visit  ?  ? OT LONG TERM GOAL #4  ? Title Pt will achieve at least a 10% limb volume reduction below the knee bilaterally to return limb to more normal size and shape, to limit infection risk, to decrease pain, to improve function, and to limit lymphedema progression.   ? Baseline Dependent   ? Time 12   ? Period Weeks   ? Status On-going   Zero limb volume reduction to date due to Pt not being able to wrap self, and limited CG availabliuty to learn to apply wraps initially, and currently to assist  with wraps daily as recomended  ? Target Date 10/29/21   ?  ? OT LONG TERM GOAL #5  ? Title Pt will achieve and sustain a least 85% compliance with all LE self-care home program components throughout Intensive Phase CDT, including daily skin inspection and care, lymphatic pumping ther ex, 23/7 compression wraps and simple  self-MLD, to sustain clinical gains made in CDT and to limit lymphedema progression and further functional decline.   ? Baseline D   ? Time 12   ? Period Weeks   ? Status On-going   08/31/21: Estimate <25% compliance with home program to date  ? Target Date 10/29/21   ?  ? OT LONG TERM GOAL #6  ? Title Pt will be able to perform all  lymphedema self-care home program components using correct techniques with extra time and assistive devices PRN (modified independence), including simple self MLD, lymphatic pumping exercise, don and doff appropriate compression garments/ devices with Max A, and perform daily skin care regime to limit lymphedema progression and infection risk.   ? Baseline D   ? Time 12   ? Period Weeks   ? Status On-going   ? Target Date 10/29/21   ? ?  ?  ? ?  ? ? ? ? ? ? ? ? Plan - 09/21/21 1215   ? ? Clinical Impression Statement Continued MLD to LLE/LLQ as established.Pt tolerated well without pain. Applied wraps as establlished. Slight reduction in leg swelling below the knee after MLD as evidenced by increased skin wrinkles and increased skin mobility. Spongy, stubborn, generalized fibrosis persists. Check volumetrics next visit to determine if Pt has reached a clinical plateau for swelling reduction and consider measuring for custom compression stocking. Cont as per POC.   ? OT Occupational Profile and History Detailed Assessment- Review of Records and additional review of physical, cognitive, psychosocial history related to current functional performance   ? Occupational performance deficits (Please refer to evaluation for details): ADL's;IADL's;Rest and Sleep;Leisure;Social Participation;Work   ? Body Structure / Function / Physical Skills ADL;ROM;IADL;Edema;Balance;Skin integrity;Mobility;Flexibility;Decreased knowledge of precautions;Decreased knowledge of use of DME   ? Rehab Potential Good   ? Clinical Decision Making Several treatment options, min-mod task modification necessary   ?  Comorbidities Affecting Occupational Performance: Presence of comorbidities impacting occupational performance   ? Modification or Assistance to Complete Evaluation  Min-Moderate modification of tasks or assist with as

## 2021-09-21 NOTE — Patient Instructions (Signed)

## 2021-09-21 NOTE — Therapy (Signed)
[facility] ?[place], [place], [address] ?Phone: [phone]   Fax:  [phone] ? ?Physical Therapy Treatment ? ?Patient Details  ?Name: Mary [name] ?MRN: [id number] ?Date of Birth: 1941-01-17 ?Referring Provider (PT): Charlynne Cousins, MD ? ? ?Encounter Date: 09/21/2021 ? ? PT End of Session - 09/21/21 1027   ? ? Visit Number 13   ? Number of Visits 25   ? Date for PT Re-Evaluation 10/27/21   ? Authorization Type Medicare Primary, [employer] Secondary   ? Authorization Time Period 08/04/21-10/27/21   ? Progress Note Due on Visit 20   ? PT Start Time 1017   ? PT Stop Time 1100   ? PT Time Calculation (min) 43 min   ? Equipment Utilized During Treatment Gait belt   ? Activity Tolerance Patient tolerated treatment well;No increased pain   ? Behavior During Therapy [facility] for tasks assessed/performed   ? ?  ?  ? ?  ? ? ?Past Medical History:  ?Diagnosis Date  ? Arrhythmia   ? Back pain   ? Depression with anxiety   ? Hip pain   ? Hyperlipidemia   ? Hypertension   ? Paroxysmal tachycardia ([place])   ? ? ?Past Surgical History:  ?Procedure Laterality Date  ? ABDOMINAL HYSTERECTOMY    ? BREAST BIOPSY    ? BREAST EXCISIONAL BIOPSY Left 2002  ? benign  ? CARDIAC CATHETERIZATION    ? 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  ? x2  ? TOTAL HIP ARTHROPLASTY Bilateral   ? done twice  ? ? ?There were no vitals filed for this visit. ? ? Subjective Assessment - 09/21/21 1024   ? ? Subjective Patient reports having a great time at her grandson's wedding. She was able to walk around with her cane, she was able to dance. "it was such a nice ceremony."   ? Pertinent History Pt is a pleasant 81 y/o female referred to PT for gait instability. Pt ambulates with RW. She reports onset of gait instability started several years ago. 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 Pt has had PT in past following surgeries. Pt  reports hx of frequent falls. She has had 2 falls in past 6 months. Pt believes falls are due to difficulty picking up L foot and dragging L foot. Pt reports no injury with falls.  Pt being seen by OT for lymphedema, reports LLE more affected than RLE. Pt currently lives alone. Her spouse passed away recently in 13-Jul-2022. Her adult son and DIL live nearby and can assist her. Pt denies any dizziness or lightheadedness. Pt reports no pain currently. 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   ? Currently in Pain? No/denies   ? ?  ?  ? ?  ? ? ? ? ? ? ? ? ? ? ?  ?INTERVENTION THIS DATE:  ?  ?Warm up on Crosstrainer, BUE/BLE, seat/arm #9, interval training level 2-4 x4 min with cues for spm >65 for cardiovascular training; ? ?Standing in parallel bars: ?Standing on 1/2 bolster (round side): ?-BLE heel raises x15 reps with BUE rail assist for strengthening ?-BLE toe raises x15 reps with BUE rail assist for strengthening ?-BLE in neutral position: ? Unsupported standing 30 sec hold ? Progressed to head turns side/side x5 reps each  ? ?  Standing with 3# ankle weight: ?-forward/backward step over 1/2  bolster x10 reps with 2 rail assist (attempted 1 rail assist but too difficulty with increased instability); ?-side stepping over 1/2 bolster x10 reps with 2 HHA and cues to increase step length for better foot clearance; ? ?Seated: ?BLE LAQ 3# ankle weight with ball between legs to challenge quad control x15 reps with cues for core stabilization for  better muscle control and positioning;  ?  ?SLS in parallel bars with BUE rail assist 30 sec hold x2 reps each LE with cues to increase LLE hip abduction when standing on LLE for better gluteal activation and to avoid trendelenburg; ?  ?Reinforced HEP with instruction to work on increasing SLS at home for better stance control and strengthening;  ? ? ? ? ? ? ? ? ? ? ? ? ? ? ? ? PT Education - 09/21/21 1027   ? ?  Education Details SPC/positioning, strengthening;   ? Person(s) Educated Patient   ? Methods Explanation;Verbal cues   ? Comprehension Verbalized understanding;Returned demonstration;Verbal cues required;Need further instruction   ? ?  ?  ? ?  ? ? ? PT Short Term Goals - 09/02/21 1449   ? ?  ? PT SHORT TERM GOAL #1  ? Title Pt will be independent with HEP in order to improve strength and balance in order to decrease fall risk and improve function at home and work.   ? Baseline 3/7: provided pt with initial HEP 4/5; compliant   ? Time 6   ? Period Weeks   ? Status Achieved   ? Target Date 09/15/21   ? ?  ?  ? ?  ? ? ? ? PT Long Term Goals - 09/02/21 1450   ? ?  ? PT LONG TERM GOAL #1  ? Title Patient will increase FOTO score to equal to or greater than 52 to demonstrate statistically significant improvement in mobility and quality of life.   ? Baseline 3/7: 50 4/5: 55%   ? Time 12   ? Period Weeks   ? Status Partially Met   ? Target Date 10/27/21   ?  ? PT LONG TERM GOAL #2  ? Title Pt will improve BERG by at least 3 points in order to demonstrate clinically significant improvement in balance.   ? Baseline 3/7: 40/56 4/5: 44/56   ? Time 12   ? Period Weeks   ? Status Achieved   ? Target Date 10/27/21   ?  ? PT LONG TERM GOAL #3  ? Title Pt will decrease 5TSTS by at least 3 seconds in order to demonstrate clinically significant improvement in LE strength.   ? Baseline 3/7: 15.36 sec with use of BUEs to assist 4/5: 9.6 seconds SUE support   ? Time 12   ? Period Weeks   ? Status Achieved   ? Target Date 10/27/21   ?  ? PT LONG TERM GOAL #4  ? Title Pt will improve ABC by at least 13% in order to demonstrate clinically significant improvement in balance confidence.   ? Baseline 3/7: 50.63% 4/5: 62%   ? Time 12   ? Period Weeks   ? Status Partially Met   ? Target Date 10/27/21   ?  ? PT LONG TERM GOAL #5  ? Title Patient will increase 10 meter walk test to >1.59ms as to improve gait speed for better community ambulation  and to reduce fall risk.   ? Baseline  3/7: 0.75 m/s with 4WW 4/5: 0.96 m/s with 4 WW   ? Time 12   ? Period Weeks   ? Status Partially Met   ? Target Date 10/27/21   ? ?  ?  ? ?  ? ? ? ? ? ? ? ? Plan - 09/21/21 1105   ? ? Clinical Impression Statement Patient motivated and participated well within session; She was instructed in advanced LE strengthening exercise. Utilized ankle weight for increased resistance; She does require rail assist for safety having difficulty reducing rail assist when stepping. Patient required min VCS for proper exercise technique. She does fatigue with prolonged standing requiring short seated rest break. Instructed patient in seated exercise when recovering. Patient also re-educated in importance of HEP, particularly SLS exercise for better stance control. She would benefit from additional skilled PT Intervention to improve strength, balance and mobility;   ? Personal Factors and Comorbidities Age;Past/Current Experience;Time since onset of injury/illness/exacerbation;Fitness;Comorbidity 3+   ? 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 Examination-Activity Limitations Bend;Reach Overhead;Stairs;Transfers;Lift;Squat;Locomotion Level;Carry;Stand;Dressing   ? Examination-Participation Restrictions Volunteer;Community Activity;Shop;Cleaning;Laundry;[employer] Work   ? Stability/Clinical Decision Making Evolving/Moderate complexity   ? Rehab Potential Good   ? PT Frequency 2x / week   ? PT Duration 12 weeks   ? PT Treatment/Interventions ADLs/Self Care Home Management;Aquatic Therapy;Biofeedback;Canalith Repostioning;Cryotherapy;Electrical Stimulation;Moist Heat;Traction;Ultrasound;DME Instruction;Gait training;Functional Biochemist, clinical;Therapeutic activities;Therapeutic exercise;Balance training;Neuromuscular re-education;Patient/family education;Orthotic Fit/Training;Wheelchair mobility training;Manual techniques;Passive  range of motion;Scar mobilization;Dry needling;Energy conservation;Splinting;Taping;Vestibular;Visual/perceptual remediation/compensation;Joint Manipulations   ? PT Next Visit Plan strengthening, gait, balance

## 2021-09-22 ENCOUNTER — Encounter: Payer: Medicare Other | Admitting: Occupational Therapy

## 2021-09-23 ENCOUNTER — Ambulatory Visit: Payer: Medicare Other | Admitting: Physical Therapy

## 2021-09-24 ENCOUNTER — Ambulatory Visit: Payer: Medicare Other

## 2021-09-24 ENCOUNTER — Ambulatory Visit: Payer: Medicare Other | Admitting: Occupational Therapy

## 2021-09-24 DIAGNOSIS — R262 Difficulty in walking, not elsewhere classified: Secondary | ICD-10-CM

## 2021-09-24 DIAGNOSIS — I890 Lymphedema, not elsewhere classified: Secondary | ICD-10-CM | POA: Diagnosis not present

## 2021-09-24 DIAGNOSIS — R2681 Unsteadiness on feet: Secondary | ICD-10-CM

## 2021-09-24 DIAGNOSIS — M6281 Muscle weakness (generalized): Secondary | ICD-10-CM

## 2021-09-24 NOTE — Therapy (Signed)
[facility] ?[place], [place], [address] ?Phone: [phone]   Fax:  [phone] ? ?Physical Therapy Treatment ? ?Patient Details  ?Name: Mary [name] ?MRN: [id number] ?Date of Birth: 1941-04-17 ?Referring Provider (PT): Charlynne Cousins, MD ? ? ?Encounter Date: 09/24/2021 ? ? PT End of Session - 09/25/21 0915   ? ? Visit Number 14   ? Number of Visits 25   ? Date for PT Re-Evaluation 10/27/21   ? Authorization Type Medicare Primary, AARP Secondary   ? Authorization Time Period 08/04/21-10/27/21   ? Progress Note Due on Visit 20   ? PT Start Time 1346   ? PT Stop Time 7121   ? PT Time Calculation (min) 43 min   ? Equipment Utilized During Treatment Gait belt   ? Activity Tolerance Patient tolerated treatment well;No increased pain   ? Behavior During Therapy [facility] for tasks assessed/performed   ? ?  ?  ? ?  ? ? ?Past Medical History:  ?Diagnosis Date  ? Arrhythmia   ? Back pain   ? Depression with anxiety   ? Hip pain   ? Hyperlipidemia   ? Hypertension   ? Paroxysmal tachycardia ([place])   ? ? ?Past Surgical History:  ?Procedure Laterality Date  ? ABDOMINAL HYSTERECTOMY    ? BREAST BIOPSY    ? BREAST EXCISIONAL BIOPSY Left 2002  ? benign  ? CARDIAC CATHETERIZATION    ? 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  ? x2  ? TOTAL HIP ARTHROPLASTY Bilateral   ? done twice  ? ? ?There were no vitals filed for this visit. ? ? Subjective Assessment - 09/24/21 1345   ? ? Subjective Pt reports no aches or pains. Pt reports no stumbles. Pt reports balance has improved.   ? Pertinent History Pt is a pleasant 81 y/o female referred to PT for gait instability. Pt ambulates with RW. She reports onset of gait instability started several years ago. 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 Pt has had PT in past following surgeries. Pt reports hx of frequent falls. She has had 2 falls in past 6 months. Pt  believes falls are due to difficulty picking up L foot and dragging L foot. Pt reports no injury with falls.  Pt being seen by OT for lymphedema, reports LLE more affected than RLE. Pt currently lives alone. Her spouse passed away recently in 07-02-22. Her adult son and DIL live nearby and can assist her. Pt denies any dizziness or lightheadedness. Pt reports no pain currently. 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   ? Limitations Lifting;Standing;Walking;House hold activities   ? How long can you sit comfortably? not limited   ? How long can you stand comfortably? Pt reports only limited due to need to stretch lower back   ? How long can you walk comfortably? At least 30 minutes   ? Diagnostic tests per chart recent imaging (February) of hip/knee unremarkable.   ? Patient Stated Goals Pt would like to be able to walk without a cane or at least not have to use her RW.   ? Currently in Pain? No/denies   ? Pain Onset Other (comment)   ? ?  ?  ? ?  ? ? ? ? ?INTERVENTIONS THIS DATE: gait belt donned and  CGA provided unless otherwise noted. ? ?Ambulation forward/backward // bars without AD 2x length of bars. Noted significant R hip drop, however, no LOB.  ?  ?Standing in parallel bars: ?Standing on 1/2 bolster (round side): ?-BLE heel raises x15, x10 reps with BUE rail assist for strengthening. Pt rates medium - hard  ?-BLE toe raises x15, x10 reps with BUE rail assist for strengthening ? ?-BLE in neutral position: ?            Unsupported standing 30 sec hold, mild increase in sway, but no UE support needed ?            Progressed to head turns horizontal and vertical head turns 10x reps for each. Intermittent UE support.   ?  ?Obstacle course with navigating cones and stepping over hurdle, performed with RW and SPC. Pt with increased BUE weightbearing through RW to clear hurdle. ? ?Toe tap onto half foam alt. BLEs x multiple reps of each. Cuing for  increasing DF and hip flexion. Pt performs with and without UE support. Pt also cued to perform reps with heel tap on foam and then mid-foot tap on foam.   ? ?Static standing one foot on floor one foot on 6" step 2x30 sec each LE. Mild decrease in postural stability, but no UE support required.  ?--progressed to performing with vert and horziontal head turns 10x for each ? ?NBOS on airex  x20 sec ? ?NBOS EC on airex with finger touch support on bar x  20 sec,. Increased sway but no LOB ? ?Pt would like to practice floor transfers next session. ? ?Pt educated throughout session about proper posture and technique with exercises. Improved exercise technique, movement at target joints, use of target muscles after min to mod verbal, visual, tactile cues. ? ? ? ? PT Education - 09/25/21 0914   ? ? Education Details exercise technique   ? Person(s) Educated Patient   ? Methods Explanation;Demonstration;Verbal cues   ? Comprehension Verbalized understanding;Returned demonstration;Need further instruction   ? ?  ?  ? ?  ? ? ? PT Short Term Goals - 09/02/21 1449   ? ?  ? PT SHORT TERM GOAL #1  ? Title Pt will be independent with HEP in order to improve strength and balance in order to decrease fall risk and improve function at home and work.   ? Baseline 3/7: provided pt with initial HEP 4/5; compliant   ? Time 6   ? Period Weeks   ? Status Achieved   ? Target Date 09/15/21   ? ?  ?  ? ?  ? ? ? ? PT Long Term Goals - 09/02/21 1450   ? ?  ? PT LONG TERM GOAL #1  ? Title Patient will increase FOTO score to equal to or greater than 52 to demonstrate statistically significant improvement in mobility and quality of life.   ? Baseline 3/7: 50 4/5: 55%   ? Time 12   ? Period Weeks   ? Status Partially Met   ? Target Date 10/27/21   ?  ? PT LONG TERM GOAL #2  ? Title Pt will improve BERG by at least 3 points in order to demonstrate clinically significant improvement in balance.   ? Baseline 3/7: 40/56 4/5: 44/56   ? Time 12   ?  Period Weeks   ? Status Achieved   ? Target Date 10/27/21   ?  ? PT LONG TERM GOAL #3  ?  Title Pt will decrease 5TSTS by at least 3 seconds in order to demonstrate clinically significant improvement in LE strength.   ? Baseline 3/7: 15.36 sec with use of BUEs to assist 4/5: 9.6 seconds SUE support   ? Time 12   ? Period Weeks   ? Status Achieved   ? Target Date 10/27/21   ?  ? PT LONG TERM GOAL #4  ? Title Pt will improve ABC by at least 13% in order to demonstrate clinically significant improvement in balance confidence.   ? Baseline 3/7: 50.63% 4/5: 62%   ? Time 12   ? Period Weeks   ? Status Partially Met   ? Target Date 10/27/21   ?  ? PT LONG TERM GOAL #5  ? Title Patient will increase 10 meter walk test to >1.19ms as to improve gait speed for better community ambulation and to reduce fall risk.   ? Baseline 3/7: 0.75 m/s with 4WW 4/5: 0.96 m/s with 4 WW   ? Time 12   ? Period Weeks   ? Status Partially Met   ? Target Date 10/27/21   ? ?  ?  ? ?  ? ? ? ? ? ? ? ? Plan - 09/25/21 0920   ? ? Clinical Impression Statement Pt highly motivated to paritcipate in session. Pt shows progress with performing more challenging balance tasks that utilize head turns without LOB. She requires no more than CGA thorughout session. Pt also making gains with strength, tolerating increased reps of therex. During appointment pt asked if she could practice floor transfers future date since this is difficult for her. The pt will benefit from further skilled PT to continue to improve strength, balance, gait and mobility.   ? Personal Factors and Comorbidities Age;Past/Current Experience;Time since onset of injury/illness/exacerbation;Fitness;Comorbidity 3+   ? 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 Examination-Activity Limitations Bend;Reach Overhead;Stairs;Transfers;Lift;Squat;Locomotion Level;Carry;Stand;Dressing   ? Examination-Participation Restrictions  Volunteer;Community Activity;Shop;Cleaning;Laundry;Y[employer]alla LeaverWork   ? Stability/Clinical Decision Making Evolving/Moderate complexity   ? Rehab Potential Good   ? PT Frequency 2x / week   ? PT Duration 12 weeks   ? PT Treatment/Interv

## 2021-09-25 ENCOUNTER — Encounter: Payer: Medicare Other | Admitting: Occupational Therapy

## 2021-09-28 ENCOUNTER — Ambulatory Visit: Payer: Medicare Other

## 2021-09-28 ENCOUNTER — Ambulatory Visit: Payer: Medicare Other | Attending: Cardiovascular Disease | Admitting: Occupational Therapy

## 2021-09-28 DIAGNOSIS — R2681 Unsteadiness on feet: Secondary | ICD-10-CM | POA: Diagnosis present

## 2021-09-28 DIAGNOSIS — R278 Other lack of coordination: Secondary | ICD-10-CM | POA: Diagnosis present

## 2021-09-28 DIAGNOSIS — R2689 Other abnormalities of gait and mobility: Secondary | ICD-10-CM | POA: Insufficient documentation

## 2021-09-28 DIAGNOSIS — I890 Lymphedema, not elsewhere classified: Secondary | ICD-10-CM | POA: Diagnosis present

## 2021-09-28 DIAGNOSIS — M6281 Muscle weakness (generalized): Secondary | ICD-10-CM | POA: Diagnosis present

## 2021-09-28 DIAGNOSIS — R262 Difficulty in walking, not elsewhere classified: Secondary | ICD-10-CM | POA: Insufficient documentation

## 2021-09-28 DIAGNOSIS — R269 Unspecified abnormalities of gait and mobility: Secondary | ICD-10-CM | POA: Insufficient documentation

## 2021-09-28 NOTE — Patient Instructions (Signed)

## 2021-09-28 NOTE — Therapy (Signed)
[place] [facility] ?[place], [place], [address] ?Phone: [phone]   Fax:  [phone] ? ?Physical Therapy Treatment ? ?Patient Details  ?Name: Mary [name] ?MRN: [id number] ?Date of Birth: 11-16-1940 ?Referring Provider (PT): Charlynne Cousins, MD ? ? ?Encounter Date: 09/28/2021 ? ? PT End of Session - 09/28/21 1655   ? ? Visit Number 15   ? Number of Visits 25   ? Date for PT Re-Evaluation 10/27/21   ? Authorization Type Medicare Primary, A[employer] Secondary   ? Authorization Time Period 08/04/21-10/27/21   ? Progress Note Due on Visit 20   ? PT Start Time 1609   ? PT Stop Time 3790   ? PT Time Calculation (min) 35 min   ? Equipment Utilized During Treatment Gait belt   ? Activity Tolerance Patient tolerated treatment well;No increased pain   ? Behavior During Therapy [facility] for tasks assessed/performed   ? ?  ?  ? ?  ? ? ?Past Medical History:  ?Diagnosis Date  ? Arrhythmia   ? Back pain   ? Depression with anxiety   ? Hip pain   ? Hyperlipidemia   ? Hypertension   ? Paroxysmal tachycardia ([place])   ? ? ?Past Surgical History:  ?Procedure Laterality Date  ? ABDOMINAL HYSTERECTOMY    ? BREAST BIOPSY    ? BREAST EXCISIONAL BIOPSY Left 2002  ? benign  ? CARDIAC CATHETERIZATION    ? 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  ? x2  ? TOTAL HIP ARTHROPLASTY Bilateral   ? done twice  ? ? ?There were no vitals filed for this visit. ? ? Subjective Assessment - 09/28/21 1604   ? ? Subjective Pt reports no pain currently. Pt had "charlie horse" prior to PT in L gastroc when trying to put her shoes. Pt reports no stumbles, LOB.   ? Pertinent History Pt is a pleasant 81 y/o female referred to PT for gait instability. Pt ambulates with RW. She reports onset of gait instability started several years ago. 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 Pt has had PT in past following surgeries. Pt reports hx of frequent  falls. She has had 2 falls in past 6 months. Pt believes falls are due to difficulty picking up L foot and dragging L foot. Pt reports no injury with falls.  Pt being seen by OT for lymphedema, reports LLE more affected than RLE. Pt currently lives alone. Her spouse passed away recently in 04-Jul-2022. Her adult son and DIL live nearby and can assist her. Pt denies any dizziness or lightheadedness. Pt reports no pain currently. 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   ? Limitations Lifting;Standing;Walking;House hold activities   ? How long can you sit comfortably? not limited   ? How long can you stand comfortably? Pt reports only limited due to need to stretch lower back   ? How long can you walk comfortably? At least 30 minutes   ? Diagnostic tests per chart recent imaging (February) of hip/knee unremarkable.   ? Patient Stated Goals Pt would like to be able to walk without a cane or at least not have to use her RW.   ? Currently in Pain? No/denies   ? Pain Onset Other (comment)   ? ?  ?  ? ?  ? ? ?  INTERVENTIONS THIS DATE: gait belt donned and CGA provided unless otherwise noted. ? ?Lunge with one foot on 6" step 2x10 each LE, BUE support on // bars ?Lunge both feet on floor 10x each LE ? ?Mat table: ?Instruction in pre-floor transfer training: long-sitting>supine>prone ?Prone > forearm support> table top 5x. Challenging for pt. ? ?Wall push-ups 3x10  ? ?STS 10x  ?TRX STS 2x10 to 15" height (cue used second set with block + airex)  ? ?Standing heel raises 15x BLEs ?Single leg heel raises 10x each LE ? ?2.5 lb each LE standing hip abduction 2x10 each LE. Challenging for RLE. ? ?Access Code: [email or web address] ?URL: [email or web address] ?Date: 09/28/2021 ?Prepared by: Ricard Dillon ? ?Exercises ?- Wall Push Up  - 1 x daily - 5 x weekly - 3 sets - 10 reps ? ?Pt educated throughout session about proper posture and technique with exercises. Improved  exercise technique, movement at target joints, use of target muscles after min to mod verbal, visual, tactile cues. ? ? ? PT Education - 09/28/21 1700   ? ? Education Details exercise technique, wall push-ups   ? Person(s) Educated Patient   ? Methods Explanation;Demonstration;Tactile cues;Verbal cues   ? Comprehension Verbalized understanding;Returned demonstration;Verbal cues required;Tactile cues required;Need further instruction   ? ?  ?  ? ?  ? ? ? PT Short Term Goals - 09/02/21 1449   ? ?  ? PT SHORT TERM GOAL #1  ? Title Pt will be independent with HEP in order to improve strength and balance in order to decrease fall risk and improve function at home and work.   ? Baseline 3/7: provided pt with initial HEP 4/5; compliant   ? Time 6   ? Period Weeks   ? Status Achieved   ? Target Date 09/15/21   ? ?  ?  ? ?  ? ? ? ? PT Long Term Goals - 09/02/21 1450   ? ?  ? PT LONG TERM GOAL #1  ? Title Patient will increase FOTO score to equal to or greater than 52 to demonstrate statistically significant improvement in mobility and quality of life.   ? Baseline 3/7: 50 4/5: 55%   ? Time 12   ? Period Weeks   ? Status Partially Met   ? Target Date 10/27/21   ?  ? PT LONG TERM GOAL #2  ? Title Pt will improve BERG by at least 3 points in order to demonstrate clinically significant improvement in balance.   ? Baseline 3/7: 40/56 4/5: 44/56   ? Time 12   ? Period Weeks   ? Status Achieved   ? Target Date 10/27/21   ?  ? PT LONG TERM GOAL #3  ? Title Pt will decrease 5TSTS by at least 3 seconds in order to demonstrate clinically significant improvement in LE strength.   ? Baseline 3/7: 15.36 sec with use of BUEs to assist 4/5: 9.6 seconds SUE support   ? Time 12   ? Period Weeks   ? Status Achieved   ? Target Date 10/27/21   ?  ? PT LONG TERM GOAL #4  ? Title Pt will improve ABC by at least 13% in order to demonstrate clinically significant improvement in balance confidence.   ? Baseline 3/7: 50.63% 4/5: 62%   ? Time 12   ?  Period Weeks   ? Status Partially Met   ? Target Date 10/27/21   ?  ? PT LONG TERM GOAL #5  ?  Title Patient will increase 10 meter walk test to >1.53ms as to improve gait speed for better community ambulation and to reduce fall risk.   ? Baseline 3/7: 0.75 m/s with 4WW 4/5: 0.96 m/s with 4 WW   ? Time 12   ? Period Weeks   ? Status Partially Met   ? Target Date 10/27/21   ? ?  ?  ? ?  ? ? ? ? ? ? ? ? Plan - 09/28/21 1655   ? ? Clinical Impression Statement Interventions focused on prerequesite strength, technique and mobility for floor transfer training. Pt found table top positioning most challenging due to UE fatigue. PT added wall-push ups to HEP to address this deficit. While pt was challenged with UE strength, she was able to progress depth of STS with TRX interventions and rated this medium. The pt will benefit from further skilled PT to continue to improve strength, balance, gait and mobility.   ? Personal Factors and Comorbidities Age;Past/Current Experience;Time since onset of injury/illness/exacerbation;Fitness;Comorbidity 3+   ? 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 Examination-Activity Limitations Bend;Reach Overhead;Stairs;Transfers;Lift;Squat;Locomotion Level;Carry;Stand;Dressing   ? Examination-Participation Restrictions Volunteer;Community Activity;Shop;Cleaning;Laundry;YValla LeaverWork   ? Stability/Clinical Decision Making Evolving/Moderate complexity   ? Rehab Potential Good   ? PT Frequency 2x / week   ? PT Duration 12 weeks   ? PT Treatment/Interventions ADLs/Self Care Home Management;Aquatic Therapy;Biofeedback;Canalith Repostioning;Cryotherapy;Electrical Stimulation;Moist Heat;Traction;Ultrasound;DME Instruction;Gait training;Functional mBiochemist, clinicalTherapeutic activities;Therapeutic exercise;Balance training;Neuromuscular re-education;Patient/family education;Orthotic Fit/Training;Wheelchair mobility training;Manual  techniques;Passive range of motion;Scar mobilization;Dry needling;Energy conservation;Splinting;Taping;Vestibular;Visual/perceptual remediation/compensation;Joint Manipulations   ? PT Next Visit Plan strength

## 2021-09-28 NOTE — Therapy (Signed)
[place] [facility] ?[place], [place], [address] ?Phone: [phone]   Fax:  [phone] ? ?Occupational Therapy Treatment ? ?Patient Details  ?Name: Mary [name] ?MRN: [id number] ?Date of Birth: [birth year] ?Referring Provider (OT): Pershing Cox, MD ? ? ?Encounter Date: 09/24/2021 ? ? OT End of Session - 09/28/21 0818   ? ? Visit Number 15   ? Number of Visits 36   ? Date for OT Re-Evaluation 10/29/21   ? OT Start Time 0300   ? OT Stop Time 0400   ? OT Time Calculation (min) 60 min   ? Equipment Utilized During Treatment loaned Rosidal foam and 2 short stretch compression wraps as Pt forgot hers today   ? Activity Tolerance Patient tolerated treatment well;No increased pain   ? Behavior During Therapy [facility] for tasks assessed/performed   ? ?  ?  ? ?  ? ? ?Past Medical History:  ?Diagnosis Date  ? Arrhythmia   ? Back pain   ? Depression with anxiety   ? Hip pain   ? Hyperlipidemia   ? Hypertension   ? Paroxysmal tachycardia ([place])   ? ? ?Past Surgical History:  ?Procedure Laterality Date  ? ABDOMINAL HYSTERECTOMY    ? BREAST BIOPSY    ? BREAST EXCISIONAL BIOPSY Left 2002  ? benign  ? CARDIAC CATHETERIZATION    ? 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  ? x2  ? TOTAL HIP ARTHROPLASTY Bilateral   ? done twice  ? ? ?There were no vitals filed for this visit. ? ? Subjective Assessment - 09/28/21 0827   ? ? Subjective  [name] presents for OT Rx vist 15/36 to address BLE lymphedema. Pt presents without compression wraps in place. She denies LE-related leg pain this morning.   ? Pertinent History HTN, B TKA x 2, B THA x2, Diastolic heart failure, back pain, tachycardia   ? Limitations difficulty walking, decreased standing tolerance, impaired transfers and functional mobility, impaired LB dressing, Limited L hip A/PROM, impaired dynamic balance, difficulty w/ LB dressing-fitting shoes, socks, pants   ? Repetition Increases Symptoms   ? Special Tests Intake FOTO score =  55/100; + [place]   ? Patient Stated Goals get leg swelling under control; I want to be able to get out and walk   ? Currently in Pain? No/denies   ? Pain Onset Other (comment)   ? ?  ?  ? ?  ? ? ? ? ? ? LYMPHEDEMA/ONCOLOGY QUESTIONNAIRE - 09/28/21 0828   ? ?  ? Lymphedema Assessments  ? Lymphedema Assessments Lower extremities   ?  ? Left Lower Extremity Lymphedema  ? Other LLE A-D volume= 4619.9 ml   ? Other LLE A-D volume INC by 9.82%   ? ?  ?  ? ?  ? ? ? ? ? ? ? ? ? ? OT Treatments/Exercises (OP) - 09/28/21 8938   ? ?  ? ADLs  ? ADL Education Given Yes   ?  ? Manual Therapy  ? Manual Therapy Edema management;Manual Lymphatic Drainage (MLD);Compression Bandaging   ? Edema Management LLE lim volumetrics   ? Compression Bandaging LLE multilayer knee length compression wraps using 1 each 8,10 and 12 cm wide short stretch bandages over a single layer of cotton stockinett and circumferentially applied single layer of 0.4 cm thick Rosidal foam.   ? ?  ?  ? ?  ? ? ? ? ? ? ? ? ? OT Education - 09/28/21  0829   ? ? Education Details Pt edu focused on differences between custom  flat knit and off-the-shelf circular elastic compression garments.  Educated Pt re indications for both and pros and cons of each, and rational for recommendations  Educated Pt re estimated costs, measuring and fitting process ,DME vendor's involvement and access to insurance benefits.   ? Person(s) Educated Patient   ? Methods Explanation;Demonstration;Handout   ? Comprehension Verbalized understanding;Returned demonstration;Need further instruction   ? ?  ?  ? ?  ? ? ? ? ? ? OT Long Term Goals - 08/31/21 1321   ? ?  ? OT LONG TERM GOAL #1  ? Title Given this patient?s low Intake score of 55/100 on the functional outcomes FOTO tool, patient will experience an increase in function of 2 points to improve basic and instrumental ADLs performance, including lymphedema self-care.   ? Baseline 55/100   ? Time 12   ? Period Weeks   ? Status On-going   ?  Target Date 10/29/21   ?  ? OT LONG TERM GOAL #2  ? Title Pt will demonstrate understanding of lymphedema prevention strategies by identifying and discussing 5 precautions using printed reference (modified assistance) to reduce risk of progression and to limit infection risk.   ? Baseline Max A   ? Time 4   ? Period Days   ? Status On-going   ? Target Date --   4th OT visit  ?  ? OT LONG TERM GOAL #3  ? Title Pt will be able to apply knee length, multi-layer, short stretch compression wraps to one limb at a time using gradient techniques with MODIFIED INDEPENDENCE (extra time) to decrease limb volume, to limit infection risk, and to limit lymphedema progression. 08/31/21: Goal revised:   "With Maximum CG assistance"   ? Baseline Max A   ? Time 4   ? Period Days   ? Status Partially Met   Patient's family member has learned to apply wraps using gradient techniques, however, limb is not consistently wrapped on a daily basis as is recommended for optimal volume reduction  ? Target Date --   4th OT visit  ?  ? OT LONG TERM GOAL #4  ? Title Pt will achieve at least a 10% limb volume reduction below the knee bilaterally to return limb to more normal size and shape, to limit infection risk, to decrease pain, to improve function, and to limit lymphedema progression.   ? Baseline Dependent   ? Time 12   ? Period Weeks   ? Status On-going   Zero limb volume reduction to date due to Pt not being able to wrap self, and limited CG availabliuty to learn to apply wraps initially, and currently to assist  with wraps daily as recomended  ? Target Date 10/29/21   ?  ? OT LONG TERM GOAL #5  ? Title Pt will achieve and sustain a least 85% compliance with all LE self-care home program components throughout Intensive Phase CDT, including daily skin inspection and care, lymphatic pumping ther ex, 23/7 compression wraps and simple self-MLD, to sustain clinical gains made in CDT and to limit lymphedema progression and further functional  decline.   ? Baseline D   ? Time 12   ? Period Weeks   ? Status On-going   08/31/21: Estimate <25% compliance with home program to date  ? Target Date 10/29/21   ?  ? OT LONG TERM GOAL #6  ? Title  Pt will be able to perform all lymphedema self-care home program components using correct techniques with extra time and assistive devices PRN (modified independence), including simple self MLD, lymphatic pumping exercise, don and doff appropriate compression garments/ devices with Max A, and perform daily skin care regime to limit lymphedema progression and infection risk.   ? Baseline D   ? Time 12   ? Period Weeks   ? Status On-going   ? Target Date 10/29/21   ? ?  ?  ? ?  ? ? ? ? ? ? ? ? Plan - 09/28/21 0819   ? ? Clinical Impression Statement LLE comparative limb volumetrics reveal an increased in limb volume from ankle to popliteal fossa of 9.82%. This increase in volume is somewhat unexpected as, although Pt has not been very compliant with compression wraps between sessions ( limited caregiver assistance), she does utilize compression as directed 2 x weekly after clinc visits and is elevating. This increase make be partially due to 2 x weekly PT for strengthening and balance training invcreasing muscler mass in the legs, and Pt's L is dominant. It may also be, in part, systemic, but we did not have time today to measure bilaterally for a comparative increase on the R. After completing measurements Pt was educated re compression garment options and recommendations. Pt's greatest obstacle is limitted hip AROM needed for donning and doffing. Next weel we'll experiment with assistive devices available in clinic in effort to ID devices she may be able to use for access. Wraps aopplied at end of session as established. Cont as per POC.   ? OT Occupational Profile and History Detailed Assessment- Review of Records and additional review of physical, cognitive, psychosocial history related to current functional performance    ? Occupational performance deficits (Please refer to evaluation for details): ADL's;IADL's;Rest and Sleep;Leisure;Social Participation;Work   ? Body Structure / Function / Physical Skills ADL;ROM;IADL;Edema;Balan

## 2021-09-29 ENCOUNTER — Encounter: Payer: Medicare Other | Admitting: Occupational Therapy

## 2021-09-29 ENCOUNTER — Other Ambulatory Visit: Payer: Self-pay | Admitting: Internal Medicine

## 2021-09-29 NOTE — Therapy (Signed)
[facility] ?[place], [place], [address] ?Phone: [phone]   Fax:  [phone] ? ?Occupational Therapy Treatment ? ?Patient Details  ?Name: Mary [name] ?MRN: [id number] ?Date of Birth: 1940/12/14 ?Referring Provider (OT): Pershing Cox, MD ? ? ?Encounter Date: 09/28/2021 ? ? ? ?Past Medical History:  ?Diagnosis Date  ? Arrhythmia   ? Back pain   ? Depression with anxiety   ? Hip pain   ? Hyperlipidemia   ? Hypertension   ? Paroxysmal tachycardia ([place])   ? ? ?Past Surgical History:  ?Procedure Laterality Date  ? ABDOMINAL HYSTERECTOMY    ? BREAST BIOPSY    ? BREAST EXCISIONAL BIOPSY Left 2002  ? benign  ? CARDIAC CATHETERIZATION    ? 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  ? x2  ? TOTAL HIP ARTHROPLASTY Bilateral   ? done twice  ? ? ?There were no vitals filed for this visit. ? ? Subjective Assessment - 09/29/21 0830   ? ? Subjective  Briellah Baik presents for OT Rx vist 15/36 to address BLE lymphedema. Pt presents without compression wraps in place. She denies LE-related leg pain this morning.Pt presents with loaned CircAid compression legging in place on Rx leg. "I was convinced that I wasn't going to like this thing, so I didnt try it over the weekend. I  kind of like it though, after trying it this morning."   ? Pertinent History HTN, B TKA x 2, B THA x2, Diastolic heart failure, back pain, tachycardia   ? Limitations difficulty walking, decreased standing tolerance, impaired transfers and functional mobility, impaired LB dressing, Limited L hip A/PROM, impaired dynamic balance, difficulty w/ LB dressing-fitting shoes, socks, pants   ? Repetition Increases Symptoms   ? Special Tests Intake FOTO score = 55/100; + [place]   ? Patient Stated Goals get leg swelling under control; I want to be able to get out and walk   ? Currently in Pain? No/denies   ? Pain Onset Other (comment)   ? ?  ?  ? ?  ? ? ? ? ? ? ? ? ? ? ? ? ? ? ? OT Treatments/Exercises (OP) -  09/29/21 0001   ? ?  ? Manual Therapy  ? Manual Therapy Edema management;Manual Lymphatic Drainage (MLD);Compression Bandaging   ? Edema Management LLE lim volumetrics   ? Compression Bandaging LLE multilayer knee length compression wraps using 1 each 8,10 and 12 cm wide short stretch bandages over a single layer of cotton stockinett and circumferentially applied single layer of 0.4 cm thick Rosidal foam.   ? ?  ?  ? ?  ? ? ? ? ? ? ? ? ? OT Education - 09/29/21 0833   ? ? Education Details Continued Pt edu  for correctly donning CircAid and using compression guage  card provided by manufacturer  to ensure Py is able to create the correct gradient using 30-40 mmHg. After skilled training Pt is able to adjust Velcro tabs correctly.   ? Person(s) Educated Patient   ? Methods Explanation;Demonstration;Handout;Tactile cues;Verbal cues   ? Comprehension Verbalized understanding;Returned demonstration;Verbal cues required;Tactile cues required   ? ?  ?  ? ?  ? ? ? ? ? ? OT Long Term Goals - 08/31/21 1321   ? ?  ? OT LONG TERM GOAL #1  ? Title Given this patient?s low Intake score of 55/100 on the functional outcomes FOTO tool, patient will experience an increase in function of  2 points to improve basic and instrumental ADLs performance, including lymphedema self-care.   ? Baseline 55/100   ? Time 12   ? Period Weeks   ? Status On-going   ? Target Date 10/29/21   ?  ? OT LONG TERM GOAL #2  ? Title Pt will demonstrate understanding of lymphedema prevention strategies by identifying and discussing 5 precautions using printed reference (modified assistance) to reduce risk of progression and to limit infection risk.   ? Baseline Max A   ? Time 4   ? Period Days   ? Status On-going   ? Target Date --   4th OT visit  ?  ? OT LONG TERM GOAL #3  ? Title Pt will be able to apply knee length, multi-layer, short stretch compression wraps to one limb at a time using gradient techniques with MODIFIED INDEPENDENCE (extra time) to  decrease limb volume, to limit infection risk, and to limit lymphedema progression. 08/31/21: Goal revised:   "With Maximum CG assistance"   ? Baseline Max A   ? Time 4   ? Period Days   ? Status Partially Met   Patient's family member has learned to apply wraps using gradient techniques, however, limb is not consistently wrapped on a daily basis as is recommended for optimal volume reduction  ? Target Date --   4th OT visit  ?  ? OT LONG TERM GOAL #4  ? Title Pt will achieve at least a 10% limb volume reduction below the knee bilaterally to return limb to more normal size and shape, to limit infection risk, to decrease pain, to improve function, and to limit lymphedema progression.   ? Baseline Dependent   ? Time 12   ? Period Weeks   ? Status On-going   Zero limb volume reduction to date due to Pt not being able to wrap self, and limited CG availabliuty to learn to apply wraps initially, and currently to assist  with wraps daily as recomended  ? Target Date 10/29/21   ?  ? OT LONG TERM GOAL #5  ? Title Pt will achieve and sustain a least 85% compliance with all LE self-care home program components throughout Intensive Phase CDT, including daily skin inspection and care, lymphatic pumping ther ex, 23/7 compression wraps and simple self-MLD, to sustain clinical gains made in CDT and to limit lymphedema progression and further functional decline.   ? Baseline D   ? Time 12   ? Period Weeks   ? Status On-going   08/31/21: Estimate <25% compliance with home program to date  ? Target Date 10/29/21   ?  ? OT LONG TERM GOAL #6  ? Title Pt will be able to perform all lymphedema self-care home program components using correct techniques with extra time and assistive devices PRN (modified independence), including simple self MLD, lymphatic pumping exercise, don and doff appropriate compression garments/ devices with Max A, and perform daily skin care regime to limit lymphedema progression and infection risk.   ? Baseline D   ?  Time 12   ? Period Weeks   ? Status On-going   ? Target Date 10/29/21   ? ?  ?  ? ?  ? ? ? ? ? ? ? ? Plan - 09/29/21 0839   ? ? Clinical Impression Statement After skilled edu Pt able to apply CircAid wrap-style compression legging using guage to create compression gradient using 30-40 mmHg from distal to proximal. Pt is able to  adjust compression down and up with extra time. She verbalizes wear and care schedule by end  session. Pt agrees to borrow garment alternative for a couple more days to fully assess before she orders. Continued MLD as established. Utilzed CircAid  today instead of short stretch wraps. Cont as per POC.   ? OT Occupational Profile and History Detailed Assessment- Review of Records and additional review of physical, cognitive, psychosocial history related to current functional performance   ? Occupational performance deficits (Please refer to evaluation for details): ADL's;IADL's;Rest and Sleep;Leisure;Social Participation;Work   ? Body Structure / Function / Physical Skills ADL;ROM;IADL;Edema;Balance;Skin integrity;Mobility;Flexibility;Decreased knowledge of precautions;Decreased knowledge of use of DME   ? Rehab Potential Good   ? Clinical Decision Making Several treatment options, min-mod task modification necessary   ? Comorbidities Affecting Occupational Performance: Presence of comorbidities impacting occupational performance   ? Modification or Assistance to Complete Evaluation  Min-Moderate modification of tasks or assist with assess necessary to complete eval   ? OT Frequency 2x / week   ? OT Duration 12 weeks   ? OT Treatment/Interventions Self-care/ADL training;DME and/or AE instruction;Manual lymph drainage;Compression bandaging;Therapeutic activities;Coping strategies training;Therapeutic exercise;Patient/family education;Manual Therapy   ? Consulted and Agree with Plan of Care Patient   ? ?  ?  ? ?  ? ? ?Patient will benefit from skilled therapeutic intervention in order to  improve the following deficits and impairments:   ?Body Structure / Function / Physical Skills: ADL, ROM, IADL, Edema, Balance, Skin integrity, Mobility, Flexibility, Decreased knowledge of precautions, Decreased knowled

## 2021-09-29 NOTE — Patient Instructions (Signed)

## 2021-09-29 NOTE — Telephone Encounter (Signed)
Requested Prescriptions  ?Pending Prescriptions Disp Refills  ?? sertraline (ZOLOFT) 50 MG tablet [Pharmacy Med Name: SERTRALINE '50MG'$  TABLETS] 30 tablet 2  ?  Sig: TAKE 1 TABLET(50 MG) BY MOUTH DAILY  ?  ? Psychiatry:  Antidepressants - SSRI - sertraline Passed - 09/29/2021 10:52 AM  ?  ?  Passed - AST in normal range and within 360 days  ?  AST  ?Date Value Ref Range Status  ?06/17/2021 16 0 - 40 IU/L Final  ?   ?  ?  Passed - ALT in normal range and within 360 days  ?  ALT  ?Date Value Ref Range Status  ?06/17/2021 13 0 - 32 IU/L Final  ?   ?  ?  Passed - Completed PHQ-2 or PHQ-9 in the last 360 days  ?  ?  Passed - Valid encounter within last 6 months  ?  Recent Outpatient Visits   ?      ? 2 months ago Diastolic heart failure, unspecified HF chronicity ([place])  ? [facility] Vigg, Avanti, MD  ? 2 months ago COVID-19  ? [place], NP  ? 2 months ago Acute pain of left knee  ? [facility] Vigg, Avanti, MD  ? 3 months ago Acute diastolic heart failure ([place])  ? [facility] Vigg, Avanti, MD  ?  ?  ?Future Appointments   ?        ? In 1 month  [place]  ? In 2 months Wynetta Emery, Barb Merino, DO [facility], [facility]  ?  ? ?  ?  ?  ? ? ?

## 2021-09-30 ENCOUNTER — Ambulatory Visit: Payer: Medicare Other

## 2021-09-30 ENCOUNTER — Ambulatory Visit: Payer: Medicare Other | Admitting: Occupational Therapy

## 2021-09-30 DIAGNOSIS — R2689 Other abnormalities of gait and mobility: Secondary | ICD-10-CM

## 2021-09-30 DIAGNOSIS — R278 Other lack of coordination: Secondary | ICD-10-CM

## 2021-09-30 DIAGNOSIS — I890 Lymphedema, not elsewhere classified: Secondary | ICD-10-CM | POA: Diagnosis not present

## 2021-09-30 DIAGNOSIS — R262 Difficulty in walking, not elsewhere classified: Secondary | ICD-10-CM

## 2021-09-30 DIAGNOSIS — R2681 Unsteadiness on feet: Secondary | ICD-10-CM

## 2021-09-30 DIAGNOSIS — R269 Unspecified abnormalities of gait and mobility: Secondary | ICD-10-CM

## 2021-09-30 DIAGNOSIS — M6281 Muscle weakness (generalized): Secondary | ICD-10-CM

## 2021-09-30 NOTE — Therapy (Signed)
[facility] ?[place], [place], [address] ?Phone: [phone]   Fax:  [phone] ? ?Physical Therapy Treatment ? ?Patient Details  ?Name: Mary [name] ?MRN: [id number] ?Date of Birth: 1940-08-23 ?Referring Provider (PT): Charlynne Cousins, MD ? ? ?Encounter Date: 09/30/2021 ? ? PT End of Session - 09/30/21 1414   ? ? Visit Number 16   ? Number of Visits 25   ? Date for PT Re-Evaluation 10/27/21   ? Authorization Type Medicare Primary, [employer] Secondary   ? Authorization Time Period 08/04/21-10/27/21   ? Progress Note Due on Visit 20   ? PT Start Time 1413   ? PT Stop Time 1455   ? PT Time Calculation (min) 42 min   ? Equipment Utilized During Treatment Gait belt   ? Activity Tolerance Patient tolerated treatment well;No increased pain   ? Behavior During Therapy [facility] for tasks assessed/performed   ? ?  ?  ? ?  ? ? ?Past Medical History:  ?Diagnosis Date  ? Arrhythmia   ? Back pain   ? Depression with anxiety   ? Hip pain   ? Hyperlipidemia   ? Hypertension   ? Paroxysmal tachycardia ([place])   ? ? ?Past Surgical History:  ?Procedure Laterality Date  ? ABDOMINAL HYSTERECTOMY    ? BREAST BIOPSY    ? BREAST EXCISIONAL BIOPSY Left 2002  ? benign  ? CARDIAC CATHETERIZATION    ? 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  ? x2  ? TOTAL HIP ARTHROPLASTY Bilateral   ? done twice  ? ? ?There were no vitals filed for this visit. ? ? Subjective Assessment - 09/30/21 1413   ? ? Subjective Patient reports doing okay- interested in floor to sit transfer training.   ? Pertinent History Pt is a pleasant 81 y/o female referred to PT for gait instability. Pt ambulates with RW. She reports onset of gait instability started several years ago. 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 Pt has had PT in past following surgeries. Pt reports hx of frequent falls. She has had 2 falls in past 6 months. Pt believes falls are  due to difficulty picking up L foot and dragging L foot. Pt reports no injury with falls.  Pt being seen by OT for lymphedema, reports LLE more affected than RLE. Pt currently lives alone. Her spouse passed away recently in 07/17/22. Her adult son and DIL live nearby and can assist her. Pt denies any dizziness or lightheadedness. Pt reports no pain currently. 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   ? Limitations Lifting;Standing;Walking;House hold activities   ? How long can you sit comfortably? not limited   ? How long can you stand comfortably? Pt reports only limited due to need to stretch lower back   ? How long can you walk comfortably? At least 30 minutes   ? Diagnostic tests per chart recent imaging (February) of hip/knee unremarkable.   ? Patient Stated Goals Pt would like to be able to walk without a cane or at least not have to use her RW.   ? Currently in Pain? No/denies   ? ?  ?  ? ?  ? ? ? ?INTERVENTIONS:  ? ?Patient discussed desire to work on floor to sit transfers as she has a mult fall  history. She reports lives alone - does not have life alert system but does wear an apple watch with ability to call out if needed.  ? ?Educated in how to use her apple watch phone option to make a call if she was to fall and not near her cell phone and if no one is around. She verbalized and able to return demo on how to successful use her phone.  ? ? ?Floor to chair transfers: ?PT demo technique with patient today and instructed on how to properly and safely perform stand to sit to lying down on red mat with chair positioned close.  ? ?Patient performed floor to sit transfer- able roll from side to stomach then to prone to quadraped and crawl toward chair- raise arms to position foreams on seat of chair and bring knees up and walk it up to standing position to quick turn and pivot to chair. Stand by assist only with VC for best technique.   ? ?Patient performed Quadraped position on large mat table transitioning from (hands to forearms) x 10 reps each arm. Patient reported fatigue in position and UE weakness.  ? ? ?Sit to stand x12 reps (Increased VC to forward lean) without use of UE support. Patient exhibited increased difficulty today with weakness yet if able to lean forward enough- much improved ability.  ? ?Education provided throughout session via VC/TC and demonstration to facilitate movement at target joints and correct muscle activation for all testing and exercises performed.  ? ? ? ? ? ? ? ? ? ? ? ? ? ? ? ? ? ? ? ? PT Education - 09/30/21 1413   ? ? Education Details Exercise Technique   ? Person(s) Educated Patient   ? Methods Explanation;Demonstration;Tactile cues;Verbal cues   ? Comprehension Verbalized understanding;Returned demonstration;Verbal cues required;Tactile cues required;Need further instruction   ? ?  ?  ? ?  ? ? ? PT Short Term Goals - 09/02/21 1449   ? ?  ? PT SHORT TERM GOAL #1  ? Title Pt will be independent with HEP in order to improve strength and balance in order to decrease fall risk and improve function at home and work.   ? Baseline 3/7: provided pt with initial HEP 4/5; compliant   ? Time 6   ? Period Weeks   ? Status Achieved   ? Target Date 09/15/21   ? ?  ?  ? ?  ? ? ? ? PT Long Term Goals - 09/02/21 1450   ? ?  ? PT LONG TERM GOAL #1  ? Title Patient will increase FOTO score to equal to or greater than 52 to demonstrate statistically significant improvement in mobility and quality of life.   ? Baseline 3/7: 50 4/5: 55%   ? Time 12   ? Period Weeks   ? Status Partially Met   ? Target Date 10/27/21   ?  ? PT LONG TERM GOAL #2  ? Title Pt will improve BERG by at least 3 points in order to demonstrate clinically significant improvement in balance.   ? Baseline 3/7: 40/56 4/5: 44/56   ? Time 12   ? Period Weeks   ? Status Achieved   ? Target Date 10/27/21   ?  ? PT LONG TERM GOAL #3  ? Title Pt will decrease 5TSTS  by at least 3 seconds in order to demonstrate clinically significant improvement in LE strength.   ? Baseline 3/7: 15.36 sec with use of  BUEs to assist 4/5: 9.6 seconds SUE support   ? Time 12   ? Period Weeks   ? Status Achieved   ? Target Date 10/27/21   ?  ? PT LONG TERM GOAL #4  ? Title Pt will improve ABC by at least 13% in order to demonstrate clinically significant improvement in balance confidence.   ? Baseline 3/7: 50.63% 4/5: 62%   ? Time 12   ? Period Weeks   ? Status Partially Met   ? Target Date 10/27/21   ?  ? PT LONG TERM GOAL #5  ? Title Patient will increase 10 meter walk test to >1.16ms as to improve gait speed for better community ambulation and to reduce fall risk.   ? Baseline 3/7: 0.75 m/s with 4WW 4/5: 0.96 m/s with 4 WW   ? Time 12   ? Period Weeks   ? Status Partially Met   ? Target Date 10/27/21   ? ?  ?  ? ?  ? ? ? ? ? ? ? ? Plan - 09/30/21 1414   ? ? Clinical Impression Statement Patient presented with good motivation for today's session. She participated well in therapeutic activity -floor to sit transfer. Patient was nervous but stated this training was important to her as she has experienced some falls and would like to be prepared and able to possibly get up on her own if trained. She first watched PT demo to learn technique then performed herself with VC and Stand by assist. She was excited to be able to accomplish although reported very difficult. She will benefit from further training to ensure confidence. The pt will benefit from further skilled PT to continue to improve strength, balance, gait and mobility.   ? Personal Factors and Comorbidities Age;Past/Current Experience;Time since onset of injury/illness/exacerbation;Fitness;Comorbidity 3+   ? 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 Examination-Activity Limitations Bend;Reach Overhead;Stairs;Transfers;Lift;Squat;Locomotion Level;Carry;Stand;Dressing   ?  Examination-Participation Restrictions Volunteer;Community Activity;Shop;Cleaning;Laundry;YValla LeaverWork   ? Stability/Clinical Decision Making Evolving/Moderate complexity   ? Rehab Potential Good   ? PT Frequency 2x / week

## 2021-10-02 ENCOUNTER — Encounter: Payer: Medicare Other | Admitting: Occupational Therapy

## 2021-10-02 NOTE — Therapy (Signed)
[place] [facility] ?[place], [place], [address] ?Phone: [phone]   Fax:  [phone] ? ?Occupational Therapy Treatment ? ?Patient Details  ?Name: Mary [name] ?MRN: [id number] ?Date of Birth: 1941/04/16 ?Referring Provider (OT): Pershing Cox, MD ? ? ?Encounter Date: 09/30/2021 ? ? OT End of Session - 10/02/21 1215   ? ? Visit Number 16   ? Number of Visits 36   ? Date for OT Re-Evaluation 10/29/21   ? OT Start Time 0300   ? OT Stop Time 0410   ? OT Time Calculation (min) 70 min   ? Equipment Utilized During Treatment loaned Rosidal foam and 2 short stretch compression wraps as Pt forgot hers today   ? Activity Tolerance Patient tolerated treatment well;No increased pain   ? Behavior During Therapy [facility] for tasks assessed/performed   ? ?  ?  ? ?  ? ? ?Past Medical History:  ?Diagnosis Date  ? Arrhythmia   ? Back pain   ? Depression with anxiety   ? Hip pain   ? Hyperlipidemia   ? Hypertension   ? Paroxysmal tachycardia ([place])   ? ? ?Past Surgical History:  ?Procedure Laterality Date  ? ABDOMINAL HYSTERECTOMY    ? BREAST BIOPSY    ? BREAST EXCISIONAL BIOPSY Left 2002  ? benign  ? CARDIAC CATHETERIZATION    ? 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  ? x2  ? TOTAL HIP ARTHROPLASTY Bilateral   ? done twice  ? ? ?There were no vitals filed for this visit. ? ? ? ? ? ? ? ? ? ? ? ? ? ? ? ? ? ? ? ? ? ? ? ? OT Education - 10/02/21 1217   ? ? Education Details Continued Pt edu  for correctly donning CircAid and using compression guage  card provided by manufacturer  to ensure Py is able to create the correct gradient using 30-40 mmHg. After skilled training Pt is able to adjust Velcro tabs correctly.   ? Person(s) Educated Patient   ? Methods Explanation;Demonstration;Handout;Tactile cues;Verbal cues   ? Comprehension Verbalized understanding;Returned demonstration;Verbal cues required;Tactile cues required   ? ?  ?  ? ?  ? ? ? ? ? ? OT Long Term Goals - 08/31/21 1321   ? ?  ? OT  LONG TERM GOAL #1  ? Title Given this patient?s low Intake score of 55/100 on the functional outcomes FOTO tool, patient will experience an increase in function of 2 points to improve basic and instrumental ADLs performance, including lymphedema self-care.   ? Baseline 55/100   ? Time 12   ? Period Weeks   ? Status On-going   ? Target Date 10/29/21   ?  ? OT LONG TERM GOAL #2  ? Title Pt will demonstrate understanding of lymphedema prevention strategies by identifying and discussing 5 precautions using printed reference (modified assistance) to reduce risk of progression and to limit infection risk.   ? Baseline Max A   ? Time 4   ? Period Days   ? Status On-going   ? Target Date --   4th OT visit  ?  ? OT LONG TERM GOAL #3  ? Title Pt will be able to apply knee length, multi-layer, short stretch compression wraps to one limb at a time using gradient techniques with MODIFIED INDEPENDENCE (extra time) to decrease limb volume, to limit infection risk, and to limit lymphedema progression. 08/31/21: Goal revised:   "With Maximum  CG assistance"   ? Baseline Max A   ? Time 4   ? Period Days   ? Status Partially Met   Patient's family member has learned to apply wraps using gradient techniques, however, limb is not consistently wrapped on a daily basis as is recommended for optimal volume reduction  ? Target Date --   4th OT visit  ?  ? OT LONG TERM GOAL #4  ? Title Pt will achieve at least a 10% limb volume reduction below the knee bilaterally to return limb to more normal size and shape, to limit infection risk, to decrease pain, to improve function, and to limit lymphedema progression.   ? Baseline Dependent   ? Time 12   ? Period Weeks   ? Status On-going   Zero limb volume reduction to date due to Pt not being able to wrap self, and limited CG availabliuty to learn to apply wraps initially, and currently to assist  with wraps daily as recomended  ? Target Date 10/29/21   ?  ? OT LONG TERM GOAL #5  ? Title Pt will  achieve and sustain a least 85% compliance with all LE self-care home program components throughout Intensive Phase CDT, including daily skin inspection and care, lymphatic pumping ther ex, 23/7 compression wraps and simple self-MLD, to sustain clinical gains made in CDT and to limit lymphedema progression and further functional decline.   ? Baseline D   ? Time 12   ? Period Weeks   ? Status On-going   08/31/21: Estimate <25% compliance with home program to date  ? Target Date 10/29/21   ?  ? OT LONG TERM GOAL #6  ? Title Pt will be able to perform all lymphedema self-care home program components using correct techniques with extra time and assistive devices PRN (modified independence), including simple self MLD, lymphatic pumping exercise, don and doff appropriate compression garments/ devices with Max A, and perform daily skin care regime to limit lymphedema progression and infection risk.   ? Baseline D   ? Time 12   ? Period Weeks   ? Status On-going   ? Target Date 10/29/21   ? ?  ?  ? ?  ? ? ? ? ? ? ? ? Plan - 10/02/21 1215   ? ? Clinical Impression Statement Continued MLD as established. Continued Pt edu for CircAid donning and doffing. Measured for size, recorded and discussed specifications ( OTS Large-LONG Juxtafit Essential)  and provided multiple online resources for ordering. Cont as per POC.   ? OT Occupational Profile and History Detailed Assessment- Review of Records and additional review of physical, cognitive, psychosocial history related to current functional performance   ? Occupational performance deficits (Please refer to evaluation for details): ADL's;IADL's;Rest and Sleep;Leisure;Social Participation;Work   ? Body Structure / Function / Physical Skills ADL;ROM;IADL;Edema;Balance;Skin integrity;Mobility;Flexibility;Decreased knowledge of precautions;Decreased knowledge of use of DME   ? Rehab Potential Good   ? Clinical Decision Making Several treatment options, min-mod task modification  necessary   ? Comorbidities Affecting Occupational Performance: Presence of comorbidities impacting occupational performance   ? Modification or Assistance to Complete Evaluation  Min-Moderate modification of tasks or assist with assess necessary to complete eval   ? OT Frequency 2x / week   ? OT Duration 12 weeks   ? OT Treatment/Interventions Self-care/ADL training;DME and/or AE instruction;Manual lymph drainage;Compression bandaging;Therapeutic activities;Coping strategies training;Therapeutic exercise;Patient/family education;Manual Therapy   ? Consulted and Agree with Plan of Care Patient   ? ?  ?  ? ?  ? ? ?  Patient will benefit from skilled therapeutic intervention in order to improve the following deficits and impairments:   ?Body Structure / Function / Physical Skills: ADL, ROM, IADL, Edema, Balance, Skin integrity, Mobility, Flexibility, Decreased knowledge of precautions, Decreased knowledge of use of DME ?  ?  ? ? ?Visit Diagnosis: ?Lymphedema, not elsewhere classified ? ? ? ?Problem List ?Patient Active Problem List  ? Diagnosis Date Noted  ? Diastolic heart failure ([place]) 07/30/2021  ? Hyperlipidemia 07/30/2021  ? Lymphedema 07/30/2021  ? Tachycardia 06/02/2021  ? Gait instability 06/02/2021  ? Primary hypertension 06/02/2021  ? ?Andrey Spearman, MS, OTR/L, CLT-LANA ?10/02/21 12:21 PM ? ? ?[place] [facility] ?[place], [place], [address] ?Phone: [phone]   Fax:  [phone] ? ?Name: Kewanda Poland ?MRN: [id number] ?Date of Birth: 1941/04/17 ? ?

## 2021-10-02 NOTE — Patient Instructions (Signed)

## 2021-10-06 ENCOUNTER — Encounter: Payer: Medicare Other | Admitting: Occupational Therapy

## 2021-10-06 ENCOUNTER — Ambulatory Visit: Payer: Medicare Other

## 2021-10-08 ENCOUNTER — Encounter: Payer: Medicare Other | Admitting: Occupational Therapy

## 2021-10-08 ENCOUNTER — Ambulatory Visit: Payer: Medicare Other | Admitting: Physical Therapy

## 2021-10-09 ENCOUNTER — Encounter: Payer: Medicare Other | Admitting: Occupational Therapy

## 2021-10-13 ENCOUNTER — Encounter: Payer: Self-pay | Admitting: Physical Therapy

## 2021-10-13 ENCOUNTER — Ambulatory Visit: Payer: Medicare Other | Admitting: Physical Therapy

## 2021-10-13 ENCOUNTER — Ambulatory Visit: Payer: Medicare Other | Admitting: Occupational Therapy

## 2021-10-13 DIAGNOSIS — M6281 Muscle weakness (generalized): Secondary | ICD-10-CM

## 2021-10-13 DIAGNOSIS — R2681 Unsteadiness on feet: Secondary | ICD-10-CM

## 2021-10-13 DIAGNOSIS — I890 Lymphedema, not elsewhere classified: Secondary | ICD-10-CM

## 2021-10-13 DIAGNOSIS — R278 Other lack of coordination: Secondary | ICD-10-CM

## 2021-10-13 DIAGNOSIS — R262 Difficulty in walking, not elsewhere classified: Secondary | ICD-10-CM

## 2021-10-13 DIAGNOSIS — R269 Unspecified abnormalities of gait and mobility: Secondary | ICD-10-CM

## 2021-10-13 DIAGNOSIS — R2689 Other abnormalities of gait and mobility: Secondary | ICD-10-CM

## 2021-10-13 NOTE — Therapy (Signed)
[place] ?[facility] ?[place], [place], [address] ?Phone: [phone]   Fax:  [phone] ? ?Physical Therapy Treatment ? ?Patient Details  ?Name: Mary [name] ?MRN: [id number] ?Date of Birth: [birth year] ?Referring Provider (PT): Charlynne Cousins, MD ? ? ?Encounter Date: 10/13/2021 ? ? PT End of Session - 10/13/21 1027   ? ? Visit Number 17   ? Number of Visits 25   ? Date for PT Re-Evaluation 10/27/21   ? Authorization Type Medicare Primary, [employer] Secondary   ? Authorization Time Period 08/04/21-10/27/21   ? Progress Note Due on Visit 20   ? PT Start Time 1018   ? PT Stop Time 1100   ? PT Time Calculation (min) 42 min   ? Equipment Utilized During Treatment Gait belt   ? Activity Tolerance Patient tolerated treatment well;No increased pain   ? Behavior During Therapy [facility] for tasks assessed/performed   ? ?  ?  ? ?  ? ? ?Past Medical History:  ?Diagnosis Date  ? Arrhythmia   ? Back pain   ? Depression with anxiety   ? Hip pain   ? Hyperlipidemia   ? Hypertension   ? Paroxysmal tachycardia ([place])   ? ? ?Past Surgical History:  ?Procedure Laterality Date  ? ABDOMINAL HYSTERECTOMY    ? BREAST BIOPSY    ? BREAST EXCISIONAL BIOPSY Left 2002  ? benign  ? CARDIAC CATHETERIZATION    ? 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  ? x2  ? TOTAL HIP ARTHROPLASTY Bilateral   ? done twice  ? ? ?There were no vitals filed for this visit. ? ? Subjective Assessment - 10/13/21 1023   ? ? Subjective Patient reports doing okay. She has been traveling and is now getting settled back at home. She reports still feeling concerned about falling. reports her strength and balance is going better; She hasn't been doing some of exercises due to traveling;   ? Pertinent History Pt is a pleasant 81 y/o female referred to PT for gait instability. Pt ambulates with RW. She reports onset of gait instability started several years ago. 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 Pt has had PT in past following surgeries. Pt reports hx of frequent falls. She has had 2 falls in past 6 months. Pt believes falls are due to difficulty picking up L foot and dragging L foot. Pt reports no injury with falls.  Pt being seen by OT for lymphedema, reports LLE more affected than RLE. Pt currently lives alone. Her spouse passed away recently in 18-Jul-2022. Her adult son and DIL live nearby and can assist her. Pt denies any dizziness or lightheadedness. Pt reports no pain currently. 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   ? Limitations Lifting;Standing;Walking;House hold activities   ? How long can you sit comfortably? not limited   ? How long can you stand comfortably? Pt reports only limited due to need to stretch lower back   ? How long can you walk comfortably? At least 30 minutes   ? Diagnostic tests per chart recent imaging (February) of hip/knee unremarkable.   ? Patient Stated Goals Pt would like to be able to walk without a cane or at least not have to use her RW.   ? Currently in Pain? No/denies   ?  Multiple Pain Sites No   ? ?  ?  ? ?  ? ? ? ? ? ? ? ?  ?  ?INTERVENTION THIS DATE:  ?  ?Warm up on Crosstrainer, BUE/BLE, seat/arm #9, level 2x4 min with cues for spm >65 for cardiovascular training; ?  ?Ex:  ?Re-educated patient in HEP: ?Standing with green tband around BLE: ?-side stepping x10 feet x2 laps each direction with cues for erect posture to challenge gluteal activation;  ?- hip abduction x10 reps each LE ?-hip extension x10 reps each LE ?Patient required min VCS for erect posture and to avoid compensation with leaning or rotating hip to isolate gluteal activation;  ? ? ?SLS in parallel bars with BUE rail assist 60 sec hold x1 reps each LE with cues to increase LLE hip abduction when standing on LLE for better gluteal activation and to avoid trendelenburg; ?  ?NMR: ?Standing in  parallel bars: ?Standing on airex pad: ?-alternate toe taps to 6 inch step x15 reps with 2-1-0 rail assist ?Standing one foot on airex and one foot on 6 inch step: ? Unsupported standing 30 sec hold ? Progressed to unsupported standing with trunk rotation x3 reps each direction, moderate difficulty reported ?  ?Reinforced HEP with instruction to work on increasing SLS at home for better stance control and strengthening;  ?  ?  ?  ?  ?  ? ? ? ? ? ? ? ? ? ? ? ? ? ? ? ? ? ? ? ? ? PT Education - 10/13/21 1027   ? ? Education Details exercise technique;   ? Person(s) Educated Patient   ? Methods Explanation;Verbal cues   ? Comprehension Verbalized understanding;Returned demonstration;Verbal cues required;Need further instruction   ? ?  ?  ? ?  ? ? ? PT Short Term Goals - 09/02/21 1449   ? ?  ? PT SHORT TERM GOAL #1  ? Title Pt will be independent with HEP in order to improve strength and balance in order to decrease fall risk and improve function at home and work.   ? Baseline 3/7: provided pt with initial HEP 4/5; compliant   ? Time 6   ? Period Weeks   ? Status Achieved   ? Target Date 09/15/21   ? ?  ?  ? ?  ? ? ? ? PT Long Term Goals - 09/02/21 1450   ? ?  ? PT LONG TERM GOAL #1  ? Title Patient will increase FOTO score to equal to or greater than 52 to demonstrate statistically significant improvement in mobility and quality of life.   ? Baseline 3/7: 50 4/5: 55%   ? Time 12   ? Period Weeks   ? Status Partially Met   ? Target Date 10/27/21   ?  ? PT LONG TERM GOAL #2  ? Title Pt will improve BERG by at least 3 points in order to demonstrate clinically significant improvement in balance.   ? Baseline 3/7: 40/56 4/5: 44/56   ? Time 12   ? Period Weeks   ? Status Achieved   ? Target Date 10/27/21   ?  ? PT LONG TERM GOAL #3  ? Title Pt will decrease 5TSTS by at least 3 seconds in order to demonstrate clinically significant improvement in LE strength.   ? Baseline 3/7: 15.36 sec with use of BUEs to assist 4/5: 9.6  seconds SUE support   ? Time 12   ? Period Weeks   ?  Status Achieved   ? Target Date 10/27/21   ?  ? PT LONG TERM GOAL #4  ? Title Pt will improve ABC by at least 13% in order to demonstrate clinically significant improvement in balance confidence.   ? Baseline 3/7: 50.63% 4/5: 62%   ? Time 12   ? Period Weeks   ? Status Partially Met   ? Target Date 10/27/21   ?  ? PT LONG TERM GOAL #5  ? Title Patient will increase 10 meter walk test to >1.78ms as to improve gait speed for better community ambulation and to reduce fall risk.   ? Baseline 3/7: 0.75 m/s with 4WW 4/5: 0.96 m/s with 4 WW   ? Time 12   ? Period Weeks   ? Status Partially Met   ? Target Date 10/27/21   ? ?  ?  ? ?  ? ? ? ? ? ? ? ? Plan - 10/13/21 1336   ? ? Clinical Impression Statement Patient motivated and participated well within session. She was instructed in advanced gluteal strengthening [place] continues to exhibit increased weakness in LLE gluteal muscle which leads to possible trendelenburg posture with right hip rotated forward and knee flexed in standing. She also exhibits increased flexion at hip in standing. Educated patient how important it is to work on gluteal strengthening/hip extension for better standing posture and hip control. She was also educated to look ahead to facilitate erect posture. Reinforced HEP for better adherence. She would benefit from additoinal skilled PT intervention to improve strength, balance and mobility;   ? Personal Factors and Comorbidities Age;Past/Current Experience;Time since onset of injury/illness/exacerbation;Fitness;Comorbidity 3+   ? 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 Examination-Activity Limitations Bend;Reach Overhead;Stairs;Transfers;Lift;Squat;Locomotion Level;Carry;Stand;Dressing   ? Examination-Participation Restrictions Volunteer;Community Activity;Shop;Cleaning;Laundry;YValla LeaverWork   ? Stability/Clinical  Decision Making Evolving/Moderate complexity   ? Rehab Potential Good   ? PT Frequency 2x / week   ? PT Duration 12 weeks   ? PT Treatment/Interventions ADLs/Self Care Home Management;Aquatic Therapy;Biofeedba

## 2021-10-13 NOTE — Patient Instructions (Signed)

## 2021-10-14 NOTE — Therapy (Signed)
[place] ?[facility] ?[place], [place], [address] ?Phone: [phone]   Fax:  [phone] ? ?Occupational Therapy Treatment ? ?Patient Details  ?Name: Mary [name] ?MRN: [id number] ?Date of Birth: 12-Nov-1940 ?Referring Provider (OT): Pershing Cox, MD ? ? ?Encounter Date: 10/13/2021 ? ? OT End of Session - 10/13/21 1048   ? ? Visit Number 17   ? Number of Visits 36   ? Date for OT Re-Evaluation 10/29/21   ? OT Start Time 1100   ? OT Stop Time 1205   ? OT Time Calculation (min) 65 min   ? Equipment Utilized During Treatment loaned Rosidal foam and 2 short stretch compression wraps as Pt forgot hers today. Loaned CircAid Juxtafit for LLE   ? Activity Tolerance Patient tolerated treatment well;No increased pain   ? Behavior During Therapy [facility] for tasks assessed/performed   ? ?  ?  ? ?  ? ? ?Past Medical History:  ?Diagnosis Date  ? Arrhythmia   ? Back pain   ? Depression with anxiety   ? Hip pain   ? Hyperlipidemia   ? Hypertension   ? Paroxysmal tachycardia ([place])   ? ? ?Past Surgical History:  ?Procedure Laterality Date  ? ABDOMINAL HYSTERECTOMY    ? BREAST BIOPSY    ? BREAST EXCISIONAL BIOPSY Left 2002  ? benign  ? CARDIAC CATHETERIZATION    ? 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  ? x2  ? TOTAL HIP ARTHROPLASTY Bilateral   ? done twice  ? ? ?There were no vitals filed for this visit. ? ? Subjective Assessment - 10/13/21 1054   ? ? Subjective  Ryelle Ruvalcaba presents for OT Rx vist to address BLE lymphedema. Pt presents without compression legging in place. She forgot to bring compression wraps and loaned CircAid to clinic today. Pt denies LE related leg pain below the knees   today.   ? Pertinent History HTN, B TKA x 2, B THA x2, Diastolic heart failure, back pain, tachycardia   ? Limitations difficulty walking, decreased standing tolerance, impaired transfers and functional mobility, impaired LB dressing, Limited L hip A/PROM, impaired dynamic balance, difficulty w/ LB  dressing-fitting shoes, socks, pants   ? Repetition Increases Symptoms   ? Special Tests Intake FOTO score = 55/100; + [place]   ? Patient Stated Goals get leg swelling under control; I want to be able to get out and walk   ? Currently in Pain? No/denies   ? Pain Onset Other (comment)   ? ?  ?  ? ?  ? ? ? ? ? ? ? ? ? ? ? ? ? ? ? OT Treatments/Exercises (OP) - 10/13/21 1056   ? ?  ? ADLs  ? ADL Education Given Yes   ?  ? Manual Therapy  ? Manual Therapy Edema management;Manual Lymphatic Drainage (MLD)   ? Edema Management anatomical measurements for BLE knee length CircAids.   ? Compression Bandaging no compression. Pt left supplies at home   ? ?  ?  ? ?  ? ? ? ? ? ? ? ? ? OT Education - 10/13/21 1050   ? ? Education Details Continued Pt/ CG edu for lymphedema self care  and home program throughout session. Topics include multilayer, gradient compression wrapping, simple self-MLD, therapeutic lymphatic pumping exercises, skin/nail care, risk reduction factors and LE precautions, compression garments/recommendations and wear and care schedule and compression garment donning / doffing using assistive devices. All questions answered to  the Pt's satisfaction. Pt demonstrates understanding by accurate return.   ? Person(s) Educated Patient   ? Methods Explanation;Demonstration;Handout   ? Comprehension Verbalized understanding;Returned demonstration;Need further instruction   ? ?  ?  ? ?  ? ? ? ? ? ? OT Long Term Goals - 08/31/21 1321   ? ?  ? OT LONG TERM GOAL #1  ? Title Given this patient?s low Intake score of 55/100 on the functional outcomes FOTO tool, patient will experience an increase in function of 2 points to improve basic and instrumental ADLs performance, including lymphedema self-care.   ? Baseline 55/100   ? Time 12   ? Period Weeks   ? Status On-going   ? Target Date 10/29/21   ?  ? OT LONG TERM GOAL #2  ? Title Pt will demonstrate understanding of lymphedema prevention strategies by identifying and  discussing 5 precautions using printed reference (modified assistance) to reduce risk of progression and to limit infection risk.   ? Baseline Max A   ? Time 4   ? Period Days   ? Status On-going   ? Target Date --   4th OT visit  ?  ? OT LONG TERM GOAL #3  ? Title Pt will be able to apply knee length, multi-layer, short stretch compression wraps to one limb at a time using gradient techniques with MODIFIED INDEPENDENCE (extra time) to decrease limb volume, to limit infection risk, and to limit lymphedema progression. 08/31/21: Goal revised:   "With Maximum CG assistance"   ? Baseline Max A   ? Time 4   ? Period Days   ? Status Partially Met   Patient's family member has learned to apply wraps using gradient techniques, however, limb is not consistently wrapped on a daily basis as is recommended for optimal volume reduction  ? Target Date --   4th OT visit  ?  ? OT LONG TERM GOAL #4  ? Title Pt will achieve at least a 10% limb volume reduction below the knee bilaterally to return limb to more normal size and shape, to limit infection risk, to decrease pain, to improve function, and to limit lymphedema progression.   ? Baseline Dependent   ? Time 12   ? Period Weeks   ? Status On-going   Zero limb volume reduction to date due to Pt not being able to wrap self, and limited CG availabliuty to learn to apply wraps initially, and currently to assist  with wraps daily as recomended  ? Target Date 10/29/21   ?  ? OT LONG TERM GOAL #5  ? Title Pt will achieve and sustain a least 85% compliance with all LE self-care home program components throughout Intensive Phase CDT, including daily skin inspection and care, lymphatic pumping ther ex, 23/7 compression wraps and simple self-MLD, to sustain clinical gains made in CDT and to limit lymphedema progression and further functional decline.   ? Baseline D   ? Time 12   ? Period Weeks   ? Status On-going   08/31/21: Estimate <25% compliance with home program to date  ? Target Date  10/29/21   ?  ? OT LONG TERM GOAL #6  ? Title Pt will be able to perform all lymphedema self-care home program components using correct techniques with extra time and assistive devices PRN (modified independence), including simple self MLD, lymphatic pumping exercise, don and doff appropriate compression garments/ devices with Max A, and perform daily skin care regime to limit  lymphedema progression and infection risk.   ? Baseline D   ? Time 12   ? Period Weeks   ? Status On-going   ? Target Date 10/29/21   ? ?  ?  ? ?  ? ? ? ? ? ? ? ? Plan - 10/13/21 1051   ? ? Clinical Impression Statement Completed anatomical measurements for CircAid Juxtalite essentials for BLE. Provided measurements to Pt  and printed order example for reference so Pt can order these online herself. Pt participated in decision making to go with these alternatives to standard elastic compression garments due limited hip AROM limits her ability to reach feet to don and doff stocki   ? OT Occupational Profile and History Detailed Assessment- Review of Records and additional review of physical, cognitive, psychosocial history related to current functional performance   ? Occupational performance deficits (Please refer to evaluation for details): ADL's;IADL's;Rest and Sleep;Leisure;Social Participation;Work   ? Body Structure / Function / Physical Skills ADL;ROM;IADL;Edema;Balance;Skin integrity;Mobility;Flexibility;Decreased knowledge of precautions;Decreased knowledge of use of DME   ? Rehab Potential Good   ? Clinical Decision Making Several treatment options, min-mod task modification necessary   ? Comorbidities Affecting Occupational Performance: Presence of comorbidities impacting occupational performance   ? Modification or Assistance to Complete Evaluation  Min-Moderate modification of tasks or assist with assess necessary to complete eval   ? OT Frequency 2x / week   ? OT Duration 12 weeks   ? OT Treatment/Interventions Self-care/ADL  training;DME and/or AE instruction;Manual lymph drainage;Compression bandaging;Therapeutic activities;Coping strategies training;Therapeutic exercise;Patient/family education;Manual Therapy   ? Consulted and Agree wi

## 2021-10-15 ENCOUNTER — Ambulatory Visit: Payer: Medicare Other | Admitting: Physical Therapy

## 2021-10-15 ENCOUNTER — Encounter: Payer: Self-pay | Admitting: Physical Therapy

## 2021-10-15 ENCOUNTER — Ambulatory Visit: Payer: Medicare Other | Admitting: Occupational Therapy

## 2021-10-15 DIAGNOSIS — R269 Unspecified abnormalities of gait and mobility: Secondary | ICD-10-CM

## 2021-10-15 DIAGNOSIS — R262 Difficulty in walking, not elsewhere classified: Secondary | ICD-10-CM

## 2021-10-15 DIAGNOSIS — I890 Lymphedema, not elsewhere classified: Secondary | ICD-10-CM | POA: Diagnosis not present

## 2021-10-15 DIAGNOSIS — M6281 Muscle weakness (generalized): Secondary | ICD-10-CM

## 2021-10-15 DIAGNOSIS — R2689 Other abnormalities of gait and mobility: Secondary | ICD-10-CM

## 2021-10-15 DIAGNOSIS — R278 Other lack of coordination: Secondary | ICD-10-CM

## 2021-10-15 DIAGNOSIS — R2681 Unsteadiness on feet: Secondary | ICD-10-CM

## 2021-10-15 NOTE — Therapy (Signed)
[place] [facility] [address] [place], [place], [address] Phone: [phone]   Fax:  [phone]  Physical Therapy Treatment  Patient Details  Name: Mary [name] MRN: [id number] Date of Birth: 05-29-1941 Referring Provider (PT): Charlynne Cousins, MD   Encounter Date: 10/15/2021   PT End of Session - 10/15/21 1014     Visit Number 18    Number of Visits 25    Date for PT Re-Evaluation 10/27/21    Authorization Type Medicare Primary, [employer] Secondary    Authorization Time Period 08/04/21-10/27/21    Progress Note Due on Visit 20    PT Start Time 1015    PT Stop Time 1058    PT Time Calculation (min) 43 min    Equipment Utilized During Treatment Gait belt    Activity Tolerance Patient tolerated treatment well;No increased pain    Behavior During Therapy WFL for tasks assessed/performed             Past Medical History:  Diagnosis Date   Arrhythmia    Back pain    Depression with anxiety    Hip pain    Hyperlipidemia    Hypertension    Paroxysmal tachycardia ([place])     Past Surgical History:  Procedure Laterality Date   ABDOMINAL HYSTERECTOMY     BREAST BIOPSY     BREAST EXCISIONAL BIOPSY Left 2002   benign   CARDIAC CATHETERIZATION     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   x2   TOTAL HIP ARTHROPLASTY Bilateral    done twice    There were no vitals filed for this visit.   Subjective Assessment - 10/15/21 1257     Subjective Patient reports doing okay. She presents with heel lift that she was given at some point during rehab after previous surgeries. She has not been wearing it and wondered if that would help.    Pertinent History Pt is a pleasant 81 y/o female referred to PT for gait instability. Pt ambulates with RW. She reports onset of gait instability started several years ago. 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 Pt has had PT in past  following surgeries. Pt reports hx of frequent falls. She has had 2 falls in past 6 months. Pt believes falls are due to difficulty picking up L foot and dragging L foot. Pt reports no injury with falls.  Pt being seen by OT for lymphedema, reports LLE more affected than RLE. Pt currently lives alone. Her spouse passed away recently in 05-Jul-2022. Her adult son and DIL live nearby and can assist her. Pt denies any dizziness or lightheadedness. Pt reports no pain currently. 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    Limitations Lifting;Standing;Walking;House hold activities    How long can you sit comfortably? not limited    How long can you stand comfortably? Pt reports only limited due to need to stretch lower back    How long can you walk comfortably? At least 30 minutes    Diagnostic tests per chart recent imaging (February) of hip/knee unremarkable.    Patient Stated Goals Pt would like to be able to walk without a cane or at least not have to use her RW.    Currently in Pain? 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 She exhibits decreased hip/knee ROM which limits tolerance on machine; She denies any  increase in pain but does report moderate difficulty;   SLS beside parallel bars with BUE rail assist 60 sec hold x1 reps each LE with cues to increase LLE hip abduction when standing on LLE for better gluteal activation and to avoid trendelenburg;   Reinforced HEP with instruction to work on increasing SLS at home for better stance control and strengthening; Also advanced with quad sets to facilitate better knee extension;                          PT Education - 10/15/21 1258     Education Details exercise technique/positioning;    Person(s) Educated Patient    Methods Explanation;Verbal cues    Comprehension Verbalized understanding;Returned demonstration;Verbal cues required;Need further instruction              PT Short Term Goals - 09/02/21 1449       PT SHORT TERM GOAL #1   Title Pt will be independent with HEP in order to improve strength and balance in order to decrease fall risk and improve function at home and work.    Baseline 3/7: provided pt with initial HEP 4/5; compliant    Time 6    Period Weeks    Status Achieved    Target Date 09/15/21               PT Long Term Goals - 09/02/21 1450       PT LONG TERM GOAL #1   Title Patient will increase FOTO score to equal to or greater than 52 to demonstrate statistically significant improvement in mobility and quality of life.    Baseline 3/7: 50 4/5: 55%    Time 12    Period Weeks    Status Partially Met    Target Date 10/27/21      PT LONG TERM GOAL #2   Title Pt will improve BERG by at least 3 points in order to demonstrate clinically significant improvement in balance.    Baseline 3/7: 40/56 4/5: 44/56    Time 12    Period Weeks    Status Achieved    Target Date 10/27/21      PT LONG TERM GOAL #3   Title Pt will decrease 5TSTS by at least 3 seconds in order to demonstrate clinically significant improvement in LE strength.    Baseline 3/7: 15.36 sec with use of BUEs to assist 4/5:  9.6 seconds SUE support    Time 12    Period Weeks    Status Achieved    Target Date 10/27/21  PT LONG TERM GOAL #4   Title Pt will improve ABC by at least 13% in order to demonstrate clinically significant improvement in balance confidence.    Baseline 3/7: 50.63% 4/5: 62%    Time 12    Period Weeks    Status Partially Met    Target Date 10/27/21      PT LONG TERM GOAL #5   Title Patient will increase 10 meter walk test to >1.55ms as to improve gait speed for better community ambulation and to reduce fall risk.    Baseline 3/7: 0.75 m/s with 4WW 4/5: 0.96 m/s with 4 WW    Time 12    Period Weeks    Status Partially Met    Target Date 10/27/21                   Plan - 10/15/21 1258     Clinical Impression Statement Patient motivated and participated well within session. She expressed concern over standing with RLE knee flexed often. PT assessed leg length, with discrepency noted, R: 90 cm, L: 91 cm. 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 As a result, PT does not recommend patient wear heel lift at this time as that could worsen contracture. PT performed passive stretches/gentle mobilizations and instructed patient in knee extension exercise. She was able to gain some motion but is still flexed. She would benefit from additional skilled PT Intervention to improve strength, balance and mobility;    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, B THA x2, diastolic heart failure, back pain, tachycardia    Examination-Activity Limitations Bend;Reach Overhead;Stairs;Transfers;Lift;Squat;Locomotion Level;Carry;Stand;Dressing    Examination-Participation Restrictions Volunteer;Community Activity;Shop;Cleaning;Laundry;Yard Work    SCopyEvolving/Moderate complexity    Rehab Potential Good    PT Frequency 2x / week    PT Duration 12 weeks    PT Treatment/Interventions ADLs/Self Care Home Management;Aquatic Therapy;Biofeedback;Canalith Repostioning;Cryotherapy;Electrical Stimulation;Moist Heat;Traction;Ultrasound;DME Instruction;Gait training;Functional mBiochemist, clinicalTherapeutic activities;Therapeutic exercise;Balance training;Neuromuscular re-education;Patient/family education;Orthotic Fit/Training;Wheelchair mobility training;Manual techniques;Passive range of motion;Scar mobilization;Dry needling;Energy conservation;Splinting;Taping;Vestibular;Visual/perceptual remediation/compensation;Joint Manipulations    PT Next Visit Plan strengthening, gait, balance, introduce to well zone as appropriate.    PT Home Exercise Plan Access Code: [email or web address] 5/1: Access Code: [email or web address]   Consulted and Agree with Plan of Care Patient             Patient will benefit from skilled therapeutic intervention in order to improve the following deficits and impairments:  Abnormal gait, Decreased balance, Decreased endurance, Decreased mobility, Difficulty walking, Hypomobility, Decreased range of motion, Improper body mechanics, Decreased activity tolerance, Decreased coordination, Decreased strength, Postural dysfunction  Visit Diagnosis: Muscle weakness (generalized)  Difficulty in walking, not elsewhere classified  Unsteadiness on feet  Other abnormalities of gait and mobility  Other lack of coordination  Abnormality of gait and mobility     Problem List Patient Active Problem List   Diagnosis Date Noted   Diastolic heart failure ([place] 07/30/2021   Hyperlipidemia 07/30/2021   Lymphedema 07/30/2021   Tachycardia 06/02/2021   Gait instability 06/02/2021   Primary hypertension 06/02/2021    Ziyanna Tolin, PT, DPT 10/15/2021, 1:01 PM  [place] [place] [address]Phone: [phone]  Fax:  [phone] Name: Mary LieuranceMRN: [id number] of Birth: 7Jun 20, 1942

## 2021-10-16 ENCOUNTER — Encounter: Payer: Medicare Other | Admitting: Occupational Therapy

## 2021-10-16 NOTE — Therapy (Signed)
[place] MAIN [facility] SERVICES [address] [place], [place], [address] Phone: [phone]   Fax:  [phone]  Occupational Therapy Treatment  Patient Details  Name: Mary [name] MRN: [id number] Date of Birth: February 01, 1941 Referring Provider (OT): Pershing Cox, MD   Encounter Date: 10/15/2021   OT End of Session - 10/15/21 1108     Visit Number 18    Number of Visits 36    Date for OT Re-Evaluation 10/29/21    OT Start Time 1100    OT Stop Time 1205    OT Time Calculation (min) 65 min    Equipment Utilized During Treatment loaned Rosidal foam and 2 short stretch compression wraps as Pt forgot hers today. Loaned CircAid Juxtafit for LLE    Activity Tolerance Patient tolerated treatment well;No increased pain    Behavior During Therapy WFL for tasks assessed/performed             Past Medical History:  Diagnosis Date   Arrhythmia    Back pain    Depression with anxiety    Hip pain    Hyperlipidemia    Hypertension    Paroxysmal tachycardia ([place])     Past Surgical History:  Procedure Laterality Date   ABDOMINAL HYSTERECTOMY     BREAST BIOPSY     BREAST EXCISIONAL BIOPSY Left 2002   benign   CARDIAC CATHETERIZATION     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   x2   TOTAL HIP ARTHROPLASTY Bilateral    done twice    There were no vitals filed for this visit.   Subjective Assessment - 10/15/21 1109     Subjective  Mary [name] presents for OT Rx vist to address BLE lymphedema. Pt presents without compression legging in place, but brings it to clinic. Pt denies LE related leg pain . She has no new complaints.    Pertinent History HTN, B TKA x 2, B THA x2, Diastolic heart failure, back pain, tachycardia    Limitations difficulty walking, decreased standing tolerance, impaired transfers and functional mobility, impaired LB dressing, Limited L hip A/PROM, impaired dynamic balance, difficulty w/ LB dressing-fitting shoes, socks, pants     Repetition Increases Symptoms    Special Tests Intake FOTO score = 55/100; + [place]    Patient Stated Goals get leg swelling under control; I want to be able to get out and walk    Currently in Pain? No/denies    Pain Onset Other (comment)                          OT Treatments/Exercises (OP) - 10/16/21 1100       ADLs   ADL Education Given Yes      Manual Therapy   Manual Therapy Edema management;Manual Lymphatic Drainage (MLD)    Edema Management LLE lim volumetrics    Compression Bandaging Max A to don loaned CircAid to maximize treatment time                    OT Education - 10/16/21 1101     Education Details Continued Pt/ CG edu for lymphedema self care  and home program throughout session. Topics include multilayer, gradient compression wrapping, simple self-MLD, therapeutic lymphatic pumping exercises, skin/nail care, risk reduction factors and LE precautions, compression garments/recommendations and wear and care schedule and compression garment donning / doffing using assistive devices. All questions answered to the Pt's satisfaction. Pt demonstrates understanding by accurate  return.    Person(s) Educated Patient    Methods Explanation;Demonstration;Handout    Comprehension Verbalized understanding;Returned demonstration;Need further instruction                 OT Long Term Goals - 08/31/21 1321       OT LONG TERM GOAL #1   Title Given this patient's low Intake score of 55/100 on the functional outcomes FOTO tool, patient will experience an increase in function of 2 points to improve basic and instrumental ADLs performance, including lymphedema self-care.    Baseline 55/100    Time 12    Period Weeks    Status On-going    Target Date 10/29/21      OT LONG TERM GOAL #2   Title Pt will demonstrate understanding of lymphedema prevention strategies by identifying and discussing 5 precautions using printed reference (modified  assistance) to reduce risk of progression and to limit infection risk.    Baseline Max A    Time 4    Period Days    Status On-going    Target Date --   4th OT visit     OT LONG TERM GOAL #3   Title Pt will be able to apply knee length, multi-layer, short stretch compression wraps to one limb at a time using gradient techniques with MODIFIED INDEPENDENCE (extra time) to decrease limb volume, to limit infection risk, and to limit lymphedema progression. 08/31/21: Goal revised:   "With Maximum CG assistance"    Baseline Max A    Time 4    Period Days    Status Partially Met   Patient's family member has learned to apply wraps using gradient techniques, however, limb is not consistently wrapped on a daily basis as is recommended for optimal volume reduction   Target Date --   4th OT visit     OT LONG TERM GOAL #4   Title Pt will achieve at least a 10% limb volume reduction below the knee bilaterally to return limb to more normal size and shape, to limit infection risk, to decrease pain, to improve function, and to limit lymphedema progression.    Baseline Dependent    Time 12    Period Weeks    Status On-going   Zero limb volume reduction to date due to Pt not being able to wrap self, and limited CG availabliuty to learn to apply wraps initially, and currently to assist  with wraps daily as recomended   Target Date 10/29/21      OT LONG TERM GOAL #5   Title Pt will achieve and sustain a least 85% compliance with all LE self-care home program components throughout Intensive Phase CDT, including daily skin inspection and care, lymphatic pumping ther ex, 23/7 compression wraps and simple self-MLD, to sustain clinical gains made in CDT and to limit lymphedema progression and further functional decline.    Baseline D    Time 12    Period Weeks    Status On-going   08/31/21: Estimate <25% compliance with home program to date   Target Date 10/29/21      OT LONG TERM GOAL #6   Title Pt will be able  to perform all lymphedema self-care home program components using correct techniques with extra time and assistive devices PRN (modified independence), including simple self MLD, lymphatic pumping exercise, don and doff appropriate compression garments/ devices with Max A, and perform daily skin care regime to limit lymphedema progression and infection risk.  Baseline D    Time 12    Period Weeks    Status On-going    Target Date 10/29/21                   Plan - 10/16/21 1057     Clinical Impression Statement Provided LLE /LLQ MLD with simultaneous skin care without increased pain. Pt continues to iuse loaned garment alternative. She has information to order on line, but has not yet done so. Pt reminded that once garment is fitted we will transition her to self-management phase of CDT with reduced frequency. Cont to encourage compliance with all home progfram components.    OT Occupational Profile and History Detailed Assessment- Review of Records and additional review of physical, cognitive, psychosocial history related to current functional performance    Occupational performance deficits (Please refer to evaluation for details): ADL's;IADL's;Rest and Sleep;Leisure;Social Participation;Work    Marketing executive / Function / Physical Skills ADL;ROM;IADL;Edema;Balance;Skin integrity;Mobility;Flexibility;Decreased knowledge of precautions;Decreased knowledge of use of DME    Rehab Potential Good    Clinical Decision Making Several treatment options, min-mod task modification necessary    Comorbidities Affecting Occupational Performance: Presence of comorbidities impacting occupational performance    Modification or Assistance to Complete Evaluation  Min-Moderate modification of tasks or assist with assess necessary to complete eval    OT Frequency 2x / week    OT Duration 12 weeks    OT Treatment/Interventions Self-care/ADL training;DME and/or AE instruction;Manual lymph  drainage;Compression bandaging;Therapeutic activities;Coping strategies training;Therapeutic exercise;Patient/family education;Manual Therapy    Consulted and Agree with Plan of Care Patient             Patient will benefit from skilled therapeutic intervention in order to improve the following deficits and impairments:   Body Structure / Function / Physical Skills: ADL, ROM, IADL, Edema, Balance, Skin integrity, Mobility, Flexibility, Decreased knowledge of precautions, Decreased knowledge of use of DME       Visit Diagnosis: Lymphedema, not elsewhere classified    Problem List Patient Active Problem List   Diagnosis Date Noted   Diastolic heart failure ([place]) 07/30/2021   Hyperlipidemia 07/30/2021   Lymphedema 07/30/2021   Tachycardia 06/02/2021   Gait instability 06/02/2021   Primary hypertension 06/02/2021   Andrey Spearman, MS, OTR/L, CLT-LANA 10/16/21 11:02 AM    [place], [place], [address] Phone: [phone]   Fax:  [phone]  Name: Mary [name] MRN: [id number] Date of Birth: Sep 24, 1940

## 2021-10-21 ENCOUNTER — Ambulatory Visit: Payer: Medicare Other | Admitting: Occupational Therapy

## 2021-10-21 ENCOUNTER — Ambulatory Visit: Payer: Medicare Other | Admitting: Physical Therapy

## 2021-10-21 ENCOUNTER — Encounter: Payer: Self-pay | Admitting: Physical Therapy

## 2021-10-21 DIAGNOSIS — R269 Unspecified abnormalities of gait and mobility: Secondary | ICD-10-CM

## 2021-10-21 DIAGNOSIS — R278 Other lack of coordination: Secondary | ICD-10-CM

## 2021-10-21 DIAGNOSIS — R262 Difficulty in walking, not elsewhere classified: Secondary | ICD-10-CM

## 2021-10-21 DIAGNOSIS — I890 Lymphedema, not elsewhere classified: Secondary | ICD-10-CM

## 2021-10-21 DIAGNOSIS — R2681 Unsteadiness on feet: Secondary | ICD-10-CM

## 2021-10-21 DIAGNOSIS — M6281 Muscle weakness (generalized): Secondary | ICD-10-CM

## 2021-10-21 DIAGNOSIS — R2689 Other abnormalities of gait and mobility: Secondary | ICD-10-CM

## 2021-10-21 NOTE — Patient Instructions (Signed)

## 2021-10-21 NOTE — Therapy (Signed)
[place] MAIN [facility] SERVICES [address] [place], [place], [address] Phone: [phone]   Fax:  [phone]  Physical Therapy Treatment  Patient Details  Name: Mary [name] MRN: [id number] Date of Birth: [birth year] Referring Provider (PT): Charlynne Cousins, MD   Encounter Date: 10/21/2021   PT End of Session - 10/21/21 1306     Visit Number 19    Number of Visits 25    Date for PT Re-Evaluation 10/27/21    Authorization Type Medicare Primary, A[employer] Secondary    Authorization Time Period 08/04/21-10/27/21    Progress Note Due on Visit 20    PT Start Time 1147    PT Stop Time 1230    PT Time Calculation (min) 43 min    Equipment Utilized During Treatment Gait belt    Activity Tolerance Patient tolerated treatment well;No increased pain    Behavior During Therapy WFL for tasks assessed/performed             Past Medical History:  Diagnosis Date   Arrhythmia    Back pain    Depression with anxiety    Hip pain    Hyperlipidemia    Hypertension    Paroxysmal tachycardia ([place])     Past Surgical History:  Procedure Laterality Date   ABDOMINAL HYSTERECTOMY     BREAST BIOPSY     BREAST EXCISIONAL BIOPSY Left 2002   benign   CARDIAC CATHETERIZATION     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   x2   TOTAL HIP ARTHROPLASTY Bilateral    done twice    There were no vitals filed for this visit.   Subjective Assessment - 10/21/21 1152     Subjective Pt reports she has been working on HEP with trying to straighten right knee. She does report increased LLE knee pain when working on SLS but states its not bad and it does not persist.    Pertinent History Pt is a pleasant 81 y/o female referred to PT for gait instability. Pt ambulates with RW. She reports onset of gait instability started several years ago. 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 Pt has had PT in past  following surgeries. Pt reports hx of frequent falls. She has had 2 falls in past 6 months. Pt believes falls are due to difficulty picking up L foot and dragging L foot. Pt reports no injury with falls.  Pt being seen by OT for lymphedema, reports LLE more affected than RLE. Pt currently lives alone. Her spouse passed away recently in 07-01-22. Her adult son and DIL live nearby and can assist her. Pt denies any dizziness or lightheadedness. Pt reports no pain currently. 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    Limitations Lifting;Standing;Walking;House hold activities    How long can you sit comfortably? not limited    How long can you stand comfortably? Pt reports only limited due to need to stretch lower back    How long can you walk comfortably? At least 30 minutes    Diagnostic tests per chart recent imaging (February) of hip/knee unremarkable.    Patient Stated Goals Pt would like to be able to walk without a cane or at least not have to use her RW.    Currently in Pain? No/denies    Multiple Pain Sites No  INTERVENTION THIS DATE:    Patient supine on mat table: PT assessed LE leg length: ASIS to medial malleolus: R: 90 cm, L approximately 91 cm   However knee flexion contracture noted on RLE: Pt has a history of RLE TKA in 2020 and reports her knee never gained full extension Manual therapy:  Extension: R: knee: -6, which is improvement from last session  PT performed grade II-III AP mobs to femur on tibia 15 sec bouts x3 sets PT performed passive hamstring stretch 30 sec hold x2 reps  Progressed to hamstring stretch with  ankle DF/PF for neural stretch x10 reps;   Ex: Instructed patient in quad set RLE 5 sec hold x2 reps with good positioning and good quad activation;  Progressed to SAQ with soccer ball 5 sec hold x10 reps with cues to keep foot oriented forward to avoid hip  ER  Manual therapy:  PT performed passive knee to chest stretch with passive IR to tolerance (minimal ROM) x15 sec hold x3-4 reps each LE;  Ex:  Instructed patient in RLE SLR hip flexion with cues for increased terminal knee extension x10 reps, moderate difficulty reported;    Following manual therapy/stretches RLE knee extension -3 degrees   Ex:  Seated; LAQ with ankle DF AROM x5 reps each LE, alternating to facilitate increased ROM/carryover in RLE;   Standing in parallel bars: Neutral stance on airex pad- required min A for neutral hip position to reduce right hip anterior rotation and verbal cues to straighten shoulder for better neural posture. Pt able to exhibit better stance posture compared to previous session but reports it doesn't feel natural and is challenging;  SLS on airex pad, BUE rail assist 60 sec hold x1 reps each LE with cues to increase LLE hip abduction when standing on LLE for better gluteal activation and to avoid trendelenburg;  Progressed to hip abduction- fire hydrant x10 reps each LE with moderate difficulty reported and required CGA for safety;   Pt ambulated with tripod base cane x80 feet exhibiting increased RLE hip ER, increased knee flexion and uneven cadence/step length.    Reinforced HEP with instruction to work on increasing SLS at home for better stance control and strengthening; Also advanced with quad sets to facilitate better knee extension;                             PT Education - 10/21/21 1306     Education Details exercise technique/positioning;    Person(s) Educated Patient    Methods Explanation;Verbal cues    Comprehension Verbalized understanding;Returned demonstration;Verbal cues required;Need further instruction              PT Short Term Goals - 09/02/21 1449       PT SHORT TERM GOAL #1   Title Pt will be independent with HEP in order to improve strength and balance in order to decrease fall risk and  improve function at home and work.    Baseline 3/7: provided pt with initial HEP 4/5; compliant    Time 6    Period Weeks    Status Achieved    Target Date 09/15/21               PT Long Term Goals - 09/02/21 1450       PT LONG TERM GOAL #1   Title Patient will increase FOTO score to equal to or greater than 52 to demonstrate statistically significant improvement  in mobility and quality of life.    Baseline 3/7: 50 4/5: 55%    Time 12    Period Weeks    Status Partially Met    Target Date 10/27/21      PT LONG TERM GOAL #2   Title Pt will improve BERG by at least 3 points in order to demonstrate clinically significant improvement in balance.    Baseline 3/7: 40/56 4/5: 44/56    Time 12    Period Weeks    Status Achieved    Target Date 10/27/21      PT LONG TERM GOAL #3   Title Pt will decrease 5TSTS by at least 3 seconds in order to demonstrate clinically significant improvement in LE strength.    Baseline 3/7: 15.36 sec with use of BUEs to assist 4/5: 9.6 seconds SUE support    Time 12    Period Weeks    Status Achieved    Target Date 10/27/21      PT LONG TERM GOAL #4   Title Pt will improve ABC by at least 13% in order to demonstrate clinically significant improvement in balance confidence.    Baseline 3/7: 50.63% 4/5: 62%    Time 12    Period Weeks    Status Partially Met    Target Date 10/27/21      PT LONG TERM GOAL #5   Title Patient will increase 10 meter walk test to >1.40ms as to improve gait speed for better community ambulation and to reduce fall risk.    Baseline 3/7: 0.75 m/s with 4WW 4/5: 0.96 m/s with 4 WW    Time 12    Period Weeks    Status Partially Met    Target Date 10/27/21                   Plan - 10/21/21 1307     Clinical Impression Statement Patient motivated and participated well within session. She reports adherence with HEP working on knee extension stretches. Patient does exhibit good carryover with better ROM at  start of session. She continues to have small knee flexion contracture. Patient also has difficulty keeping foot in neutral, often rotating foot out into hip ER. She exhibits significant tightness with minimal hip IR passively on either LE. Patient tolerated passive stretches well with minimal discomfort. She continues to have leg length discrepency with LLE longer than RLE. She was instructed in advanced LE strengthening exercise. Patient does require min VCS for proper positioning. She reports increased fatigue at end of session. Patient was able to exhibit better posture in standing compared to previous sessions. However when walking with tripod base cane, she exhibits hip ER with increased lateral trunk lean, likely from weakness in gluteal muscles. Patient would benefit from additional skilled PT Intervention to improve strength, balance and mobility;    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, B THA x2, diastolic heart failure, back pain, tachycardia    Examination-Activity Limitations Bend;Reach Overhead;Stairs;Transfers;Lift;Squat;Locomotion Level;Carry;Stand;Dressing    Examination-Participation Restrictions Volunteer;Community Activity;Shop;Cleaning;Laundry;Yard Work    SMerchant navy officerEvolving/Moderate complexity    Rehab Potential Good    PT Frequency 2x / week    PT Duration 12 weeks    PT Treatment/Interventions ADLs/Self Care Home Management;Aquatic Therapy;Biofeedback;Canalith Repostioning;Cryotherapy;Electrical Stimulation;Moist Heat;Traction;Ultrasound;DME Instruction;Gait training;Functional mBiochemist, clinicalTherapeutic activities;Therapeutic exercise;Balance training;Neuromuscular re-education;Patient/family education;Orthotic Fit/Training;Wheelchair mobility training;Manual techniques;Passive range of motion;Scar  mobilization;Dry needling;Energy conservation;Splinting;Taping;Vestibular;Visual/perceptual  remediation/compensation;Joint Manipulations    PT Next Visit Plan strengthening, gait, balance, introduce to well zone as appropriate.    PT Home Exercise Plan Access Code: [email or web address]; 5/1: Access Code: [email or web address]    Consulted and Agree with Plan of Care Patient             Patient will benefit from skilled therapeutic intervention in order to improve the following deficits and impairments:  Abnormal gait, Decreased balance, Decreased endurance, Decreased mobility, Difficulty walking, Hypomobility, Decreased range of motion, Improper body mechanics, Decreased activity tolerance, Decreased coordination, Decreased strength, Postural dysfunction  Visit Diagnosis: Muscle weakness (generalized)  Difficulty in walking, not elsewhere classified  Unsteadiness on feet  Other abnormalities of gait and mobility  Other lack of coordination  Abnormality of gait and mobility     Problem List Patient Active Problem List   Diagnosis Date Noted   Diastolic heart failure ([place]) 07/30/2021   Hyperlipidemia 07/30/2021   Lymphedema 07/30/2021   Tachycardia 06/02/2021   Gait instability 06/02/2021   Primary hypertension 06/02/2021    Ahmon Tosi, PT, DPT 10/21/2021, 1:12 PM  [place] [address] [place], [place], [address] Phone: [phone]   Fax:  [phone]  Name: Nancie Bocanegra MRN: [id number] Date of Birth: 1941-03-22

## 2021-10-21 NOTE — Therapy (Signed)
[place] MAIN [facility] SERVICES [address] [place], [place], [address] Phone: [phone]   Fax:  [phone]  Occupational Therapy Treatment  Patient Details  Name: Mary [name] MRN: [id number] Date of Birth: 1940/06/18 Referring Provider (OT): Mary Cox, MD   Encounter Date: 10/21/2021   OT End of Session - 10/21/21 1312     Visit Number 19    Number of Visits 36    Date for OT Re-Evaluation 10/29/21    OT Start Time 0105    OT Stop Time 0225    OT Time Calculation (min) 80 min    Equipment Utilized During Treatment loaned Rosidal foam and 2 short stretch compression wraps as Pt forgot hers today. Loaned CircAid Juxtafit for LLE    Activity Tolerance Patient tolerated treatment well;No increased pain    Behavior During Therapy WFL for tasks assessed/performed             Past Medical History:  Diagnosis Date   Arrhythmia    Back pain    Depression with anxiety    Hip pain    Hyperlipidemia    Hypertension    Paroxysmal tachycardia ([place])     Past Surgical History:  Procedure Laterality Date   ABDOMINAL HYSTERECTOMY     BREAST BIOPSY     BREAST EXCISIONAL BIOPSY Left 2002   benign   CARDIAC CATHETERIZATION     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   x2   TOTAL HIP ARTHROPLASTY Bilateral    done twice    There were no vitals filed for this visit.   Subjective Assessment - 10/21/21 1312     Subjective  Mary [name] presents for OT Rx vist to address BLE lymphedema. Pt presents without compression legging in place, but brings it to clinic. Pt denies LE related leg pain . She has no new complaints. She reports she has ordered recommended compression leggings this past monday and they are expected any day. OT agrees with Pt request to cancel visit scheduled tomorrow. unless CircAids have arrived.    Pertinent History HTN, B TKA x 2, B THA x2, Diastolic heart failure, back pain, tachycardia    Limitations difficulty walking,  decreased standing tolerance, impaired transfers and functional mobility, impaired LB dressing, Limited L hip A/PROM, impaired dynamic balance, difficulty w/ LB dressing-fitting shoes, socks, pants    Repetition Increases Symptoms    Special Tests Intake FOTO score = 55/100; + [place]    Patient Stated Goals get leg swelling under control; I want to be able to get out and walk    Currently in Pain? No/denies    Pain Score 0-No pain    Pain Onset Other (comment)                          OT Treatments/Exercises (OP) - 10/21/21 1505       ADLs   ADL Education Given Yes      Manual Therapy   Manual Therapy Edema management;Manual Lymphatic Drainage (MLD)    Manual Lymphatic Drainage (MLD) MLD to LLE/LLQ MLD utilizing short neck sequence, functional L inguinal LN, and proximal to distal J strokes to thigh, knee, leg and foot. Good tolerance.    Compression Bandaging Max A to don loaned CircAid to maximize treatment time                    OT Education - 10/21/21 1507     Education  Details Pt edu for simple self MLD    Person(s) Educated Patient    Methods Explanation;Demonstration;Handout;Tactile cues;Verbal cues    Comprehension Verbalized understanding;Returned demonstration;Need further instruction;Verbal cues required;Tactile cues required                 OT Long Term Goals - 08/31/21 1321       OT LONG TERM GOAL #1   Title Given this patient's low Intake score of 55/100 on the functional outcomes FOTO tool, patient will experience an increase in function of 2 points to improve basic and instrumental ADLs performance, including lymphedema self-care.    Baseline 55/100    Time 12    Period Weeks    Status On-going    Target Date 10/29/21      OT LONG TERM GOAL #2   Title Pt will demonstrate understanding of lymphedema prevention strategies by identifying and discussing 5 precautions using printed reference (modified assistance) to reduce risk  of progression and to limit infection risk.    Baseline Max A    Time 4    Period Days    Status On-going    Target Date --   4th OT visit     OT LONG TERM GOAL #3   Title Pt will be able to apply knee length, multi-layer, short stretch compression wraps to one limb at a time using gradient techniques with MODIFIED INDEPENDENCE (extra time) to decrease limb volume, to limit infection risk, and to limit lymphedema progression. 08/31/21: Goal revised:   "With Maximum CG assistance"    Baseline Max A    Time 4    Period Days    Status Partially Met   Patient's family member has learned to apply wraps using gradient techniques, however, limb is not consistently wrapped on a daily basis as is recommended for optimal volume reduction   Target Date --   4th OT visit     OT LONG TERM GOAL #4   Title Pt will achieve at least a 10% limb volume reduction below the knee bilaterally to return limb to more normal size and shape, to limit infection risk, to decrease pain, to improve function, and to limit lymphedema progression.    Baseline Dependent    Time 12    Period Weeks    Status On-going   Zero limb volume reduction to date due to Pt not being able to wrap self, and limited CG availabliuty to learn to apply wraps initially, and currently to assist  with wraps daily as recomended   Target Date 10/29/21      OT LONG TERM GOAL #5   Title Pt will achieve and sustain a least 85% compliance with all LE self-care home program components throughout Intensive Phase CDT, including daily skin inspection and care, lymphatic pumping ther ex, 23/7 compression wraps and simple self-MLD, to sustain clinical gains made in CDT and to limit lymphedema progression and further functional decline.    Baseline D    Time 12    Period Weeks    Status On-going   08/31/21: Estimate <25% compliance with home program to date   Target Date 10/29/21      OT LONG TERM GOAL #6   Title Pt will be able to perform all lymphedema  self-care home program components using correct techniques with extra time and assistive devices PRN (modified independence), including simple self MLD, lymphatic pumping exercise, don and doff appropriate compression garments/ devices with Max A, and perform daily skin  care regime to limit lymphedema progression and infection risk.    Baseline D    Time 12    Period Weeks    Status On-going    Target Date 10/29/21                   Plan - 10/21/21 1501     Clinical Impression Statement Continued MLD as established. while providing Pt edu and demonstration throughout session for simple self MLD. By end of session Pt able to perform J stroke with min A. OT provided handouts, including a picture with MLD sequence numbered for reference. Briefly discussed lymphatic pumping and how this also follows same sequence as MLD. Pt given handout with instructions for repetitions and sequence. She will try at home and we'll review in clinic. Pt has ordered recommended compression alternatives- CircAid compression leggings. Cont as per POC.    OT Occupational Profile and History Detailed Assessment- Review of Records and additional review of physical, cognitive, psychosocial history related to current functional performance    Occupational performance deficits (Please refer to evaluation for details): ADL's;IADL's;Rest and Sleep;Leisure;Social Participation;Work    Marketing executive / Function / Physical Skills ADL;ROM;IADL;Edema;Balance;Skin integrity;Mobility;Flexibility;Decreased knowledge of precautions;Decreased knowledge of use of DME    Rehab Potential Good    Clinical Decision Making Several treatment options, min-mod task modification necessary    Comorbidities Affecting Occupational Performance: Presence of comorbidities impacting occupational performance    Modification or Assistance to Complete Evaluation  Min-Moderate modification of tasks or assist with assess necessary to complete eval     OT Frequency 2x / week    OT Duration 12 weeks    OT Treatment/Interventions Self-care/ADL training;DME and/or AE instruction;Manual lymph drainage;Compression bandaging;Therapeutic activities;Coping strategies training;Therapeutic exercise;Patient/family education;Manual Therapy    Consulted and Agree with Plan of Care Patient             Patient will benefit from skilled therapeutic intervention in order to improve the following deficits and impairments:   Body Structure / Function / Physical Skills: ADL, ROM, IADL, Edema, Balance, Skin integrity, Mobility, Flexibility, Decreased knowledge of precautions, Decreased knowledge of use of DME       Visit Diagnosis: Lymphedema, not elsewhere classified    Problem List Patient Active Problem List   Diagnosis Date Noted   Diastolic heart failure ([place]) 07/30/2021   Hyperlipidemia 07/30/2021   Lymphedema 07/30/2021   Tachycardia 06/02/2021   Gait instability 06/02/2021   Primary hypertension 06/02/2021   Andrey Spearman, MS, OTR/L, CLT-LANA 10/21/21 3:10 PM   [place] MAIN [facility] SERVICES [place], [place], [address] Phone: [phone]   Fax:  [phone]  Name: Akiya Morr MRN: [id number] Date of Birth: 07-14-1940

## 2021-10-22 ENCOUNTER — Ambulatory Visit: Payer: Medicare Other | Admitting: Occupational Therapy

## 2021-10-22 ENCOUNTER — Ambulatory Visit: Payer: Medicare Other

## 2021-10-23 ENCOUNTER — Other Ambulatory Visit: Payer: Self-pay | Admitting: Internal Medicine

## 2021-10-27 NOTE — Telephone Encounter (Signed)
Requested Prescriptions  Pending Prescriptions Disp Refills  . buPROPion (WELLBUTRIN SR) 150 MG 12 hr tablet [Pharmacy Med Name: BUPROPION SR '150MG'$  TABLETS (12 H)] 60 tablet 2    Sig: TAKE 1 TABLET(150 MG) BY MOUTH TWICE DAILY     Psychiatry: Antidepressants - bupropion Failed - 10/23/2021  8:00 AM      Failed - Cr in normal range and within 360 days    Creatinine, Ser  Date Value Ref Range Status  06/17/2021 1.01 (H) 0.57 - 1.00 mg/dL Final         Passed - AST in normal range and within 360 days    AST  Date Value Ref Range Status  06/17/2021 16 0 - 40 IU/L Final         Passed - ALT in normal range and within 360 days    ALT  Date Value Ref Range Status  06/17/2021 13 0 - 32 IU/L Final         Passed - Last BP in normal range    BP Readings from Last 1 Encounters:  09/01/21 100/60         Passed - Valid encounter within last 6 months    Recent Outpatient Visits          2 months ago Diastolic heart failure, unspecified HF chronicity ([place])   [facility] Vigg, Avanti, MD   3 months ago [place], Santiago Glad, NP   3 months ago Acute pain of left knee   [facility] Vigg, Avanti, MD   4 months ago Acute diastolic heart failure ([place])   [place], Avanti, MD      Future Appointments            In 1 week  [place], [place]   In 1 month [place], Megan P, DO [employer], [facility]

## 2021-10-28 ENCOUNTER — Ambulatory Visit: Payer: Medicare Other | Admitting: Occupational Therapy

## 2021-10-28 ENCOUNTER — Ambulatory Visit: Payer: Medicare Other

## 2021-10-28 DIAGNOSIS — M6281 Muscle weakness (generalized): Secondary | ICD-10-CM

## 2021-10-28 DIAGNOSIS — I890 Lymphedema, not elsewhere classified: Secondary | ICD-10-CM

## 2021-10-28 DIAGNOSIS — R269 Unspecified abnormalities of gait and mobility: Secondary | ICD-10-CM

## 2021-10-28 DIAGNOSIS — R278 Other lack of coordination: Secondary | ICD-10-CM

## 2021-10-28 DIAGNOSIS — R2689 Other abnormalities of gait and mobility: Secondary | ICD-10-CM

## 2021-10-28 DIAGNOSIS — R262 Difficulty in walking, not elsewhere classified: Secondary | ICD-10-CM

## 2021-10-28 DIAGNOSIS — R2681 Unsteadiness on feet: Secondary | ICD-10-CM

## 2021-10-28 NOTE — Therapy (Signed)
[place] MAIN [facility] SERVICES [address] [place], [place], [address] Phone: [phone]   Fax:  [phone]  Physical Therapy Treatment/Physical Therapy Progress Note   Dates of reporting period  09/02/2021  to   10/28/2021  Patient Details  Name: Mary [name] MRN: [id number] Date of Birth: Nov 13, 1940 Referring Provider (PT): Charlynne Cousins, MD   Encounter Date: 10/28/2021   PT End of Session - 10/28/21 1440     Visit Number 20    Number of Visits 44    Date for PT Re-Evaluation 01/20/22    Authorization Type Medicare Primary, [employer] Secondary    Authorization Time Period 08/04/21-10/27/21; 10/28/2021- 01/20/2022    Progress Note Due on Visit 20    PT Start Time 1347    PT Stop Time 1432    PT Time Calculation (min) 45 min    Equipment Utilized During Treatment Gait belt    Activity Tolerance Patient tolerated treatment well;No increased pain    Behavior During Therapy WFL for tasks assessed/performed             Past Medical History:  Diagnosis Date   Arrhythmia    Back pain    Depression with anxiety    Hip pain    Hyperlipidemia    Hypertension    Paroxysmal tachycardia ([place])     Past Surgical History:  Procedure Laterality Date   ABDOMINAL HYSTERECTOMY     BREAST BIOPSY     BREAST EXCISIONAL BIOPSY Left 2002   benign   CARDIAC CATHETERIZATION     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   x2   TOTAL HIP ARTHROPLASTY Bilateral    done twice    There were no vitals filed for this visit.   INTERVENTIONS:   Subjective Assessment - 10/28/21 1445     Subjective Patient reports difficulty with balance and continued weakness in LE and tightness in Right LE    Pertinent History Pt is a pleasant 81 y/o female referred to PT for gait instability. Pt ambulates with RW. She reports onset of gait instability started several years ago. 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 Pt has had PT in past following surgeries. Pt reports hx of frequent falls. She has had 2 falls in past 6 months. Pt believes falls are due to difficulty picking up L foot and dragging L foot. Pt reports no injury with falls.  Pt being seen by OT for lymphedema, reports LLE more affected than RLE. Pt currently lives alone. Her spouse passed away recently in 2022-07-11. Her adult son and DIL live nearby and can assist her. Pt denies any dizziness or lightheadedness. Pt reports no pain currently. 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    Limitations Lifting;Standing;Walking;House hold activities    How long can you sit comfortably? not limited    How long can you stand comfortably? Pt reports only limited due to need to stretch lower back    How long can you walk comfortably? At least 30 minutes    Diagnostic tests per chart recent imaging (February) of hip/knee unremarkable.    Patient Stated Goals Pt would like to be able to walk without a cane or at least not have to use her RW.    Currently in Pain? No/denies  Reassessed all remaining goals for today's progress note/recert visit.   FOTO: 53% (goal met)   ABC = 63% (progressing)   10MWT= 0.97% (progressing)   Added new goals:   Right knee ext AROM: 15 deg from zero  Single leg stance time= 1 sec at best each LE (mult attempts)   Patient requested to add floor to sit transfer goal due to fall history and the fact that she lives at home alone.   Added below knee stretching to focus on knee ext ROM:   Access Code: [email or web address] URL: [email or web address] Date: 10/28/2021 Prepared by: Sande Brothers  Exercises - Supine Hamstring Stretch with Strap  - 3 x daily - 3 sets - 10 reps - 20-30 sec hold - Seated Hamstring Stretch  - 3 x daily - 3 sets - 10 reps - 20-30 sec hold - Seated Hamstring Stretch with Chair  - 1 x daily -  10-15 min hold - Single Leg Stance  - 1 x daily - 3 x weekly - 3 sets - 10 reps - as long as possible hold                             PT Education - 10/28/21 1446     Education Details Discussed plan for PT recert today and HEP involving hamstring lengthening.    Person(s) Educated Patient    Methods Explanation;Demonstration;Tactile cues;Verbal cues;Handout    Comprehension Verbalized understanding;Verbal cues required;Returned demonstration;Tactile cues required;Need further instruction              PT Short Term Goals - 09/02/21 1449       PT SHORT TERM GOAL #1   Title Pt will be independent with HEP in order to improve strength and balance in order to decrease fall risk and improve function at home and work.    Baseline 3/7: provided pt with initial HEP 4/5; compliant    Time 6    Period Weeks    Status Achieved    Target Date 09/15/21               PT Long Term Goals - 10/28/21 1410       PT LONG TERM GOAL #1   Title Patient will increase FOTO score to equal to or greater than 52 to demonstrate statistically significant improvement in mobility and quality of life.    Baseline 3/7: 50 4/5: 55%; 10/28/2021=53%    Time 12    Period Weeks    Status Achieved    Target Date 10/27/21      PT LONG TERM GOAL #2   Title Pt will improve BERG by at least 3 points in order to demonstrate clinically significant improvement in balance.    Baseline 3/7: 40/56 4/5: 44/56    Time 12    Period Weeks    Status Achieved    Target Date 10/27/21      PT LONG TERM GOAL #3   Title Pt will decrease 5TSTS by at least 3 seconds in order to demonstrate clinically significant improvement in LE strength.    Baseline 3/7: 15.36 sec with use of BUEs to assist 4/5: 9.6 seconds SUE support    Time 12    Period Weeks    Status Achieved    Target Date 10/27/21      PT LONG TERM GOAL #4   Title Pt will improve ABC by at least 13% in order to demonstrate clinically  significant improvement in balance confidence.    Baseline 3/7: 50.63% 4/5: 62% 5/31= 63%    Time 12    Period Weeks    Status Partially Met    Target Date 01/20/22      PT LONG TERM GOAL #5   Title Patient will increase 10 meter walk test to >1.59ms as to improve gait speed for better community ambulation and to reduce fall risk.    Baseline 3/7: 0.75 m/s with 4WW 4/5: 0.96 m/s with 4 WW; 10/28/2021= 0.97 m/s with 4WW    Time 12    Period Weeks    Status Partially Met    Target Date 01/20/22      Additional Long Term Goals   Additional Long Term Goals Yes      PT LONG TERM GOAL #6   Title Patient will demonstrate improved balance by exhibiting ability to single leg stance > 3 sec consistently without UE support for improved dynamic balance.    Baseline 10/28/2021= 1 sec each LE prior to requiring UE support    Time 12    Period Weeks    Status New    Target Date 01/20/22      PT LONG TERM GOAL #7   Title Patient will present with improved right knee AROM <10 deg knee ext from zero for decreased limp and leg length discrepancy to aide in walking and weight shifting.    Baseline 10/28/2021= 15 deg from zero (right knee ext)    Time 12    Period Weeks    Status New    Target Date 01/20/22      PT LONG TERM GOAL #8   Title Patient will demo modified Ind floor to sit transfer using chair for improved ability to get up off the floor for decreased caregiver or EMS Support.    Baseline 10/28/2021= Not formally assessed yet patient reports difficulty and desire to practice to be able to perform on her own.    Time 12    Period Weeks    Status New    Target Date 01/20/22                   Plan - 10/28/21 1442     Clinical Impression Statement Patient presents with good motivation today for today's recert/progress note visit. She is able to demo improved self perceived ability vs initial evaluation as well as improved overall strength (meeting strength goal earlier in the  cert period) and improved gait speed as seen by 10 Meter walk test. Despite these gains she continues to present with BLE muscle weakness- primarily in gluteal/hip region as well as limited right knee ROM affecting her gait quality and decreased overall balance with poor ability to perform single leg stance. Patient's condition has the potential to improve in response to therapy. Maximum improvement is yet to be obtained. The anticipated improvement is attainable and reasonable in a generally predictable time    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, B THA x2, diastolic heart failure, back pain, tachycardia    Examination-Activity Limitations Bend;Reach Overhead;Stairs;Transfers;Lift;Squat;Locomotion Level;Carry;Stand;Dressing    Examination-Participation Restrictions Volunteer;Community Activity;Shop;Cleaning;Laundry;Yard Work    SMerchant navy officerEvolving/Moderate complexity    Rehab Potential Good    PT Frequency 2x / week    PT Duration 12 weeks    PT Treatment/Interventions ADLs/Self Care Home Management;Aquatic Therapy;Biofeedback;Canalith Repostioning;Cryotherapy;Electrical Stimulation;Moist Heat;Traction;Ultrasound;DME Instruction;Gait training;Functional mobility  training;Stair training;Therapeutic activities;Therapeutic exercise;Balance training;Neuromuscular re-education;Patient/family education;Orthotic Fit/Training;Wheelchair mobility training;Manual techniques;Passive range of motion;Scar mobilization;Dry needling;Energy conservation;Splinting;Taping;Vestibular;Visual/perceptual remediation/compensation;Joint Manipulations    PT Next Visit Plan strengthening, gait, balance, ROM- manual therapy for Improved ROM RIght knee.    PT Home Exercise Plan Access Code: [email or web address]; 5/1: Access Code: [email or web address]; 10/28/2021=Access Code: [email or web address]  URL:  [email or web address]    Consulted and Agree with Plan of Care Patient             Patient will benefit from skilled therapeutic intervention in order to improve the following deficits and impairments:  Abnormal gait, Decreased balance, Decreased endurance, Decreased mobility, Difficulty walking, Hypomobility, Decreased range of motion, Improper body mechanics, Decreased activity tolerance, Decreased coordination, Decreased strength, Postural dysfunction  Visit Diagnosis: Muscle weakness (generalized)  Difficulty in walking, not elsewhere classified  Unsteadiness on feet  Other abnormalities of gait and mobility  Other lack of coordination  Abnormality of gait and mobility     Problem List Patient Active Problem List   Diagnosis Date Noted   Diastolic heart failure ([place]) 07/30/2021   Hyperlipidemia 07/30/2021   Lymphedema 07/30/2021   Tachycardia 06/02/2021   Gait instability 06/02/2021   Primary hypertension 06/02/2021    Lewis Moccasin, PT 10/28/2021, 3:13 PM  [place] MAIN [facility] SERVICES [address] [place], [place], [address] Phone: [phone]   Fax:  [phone]  Name: Quida Glasser MRN: [id number] Date of Birth: [birth year]

## 2021-10-29 ENCOUNTER — Ambulatory Visit: Payer: Medicare Other | Attending: Cardiovascular Disease | Admitting: Physical Therapy

## 2021-10-29 ENCOUNTER — Encounter: Payer: Self-pay | Admitting: Physical Therapy

## 2021-10-29 DIAGNOSIS — R278 Other lack of coordination: Secondary | ICD-10-CM | POA: Insufficient documentation

## 2021-10-29 DIAGNOSIS — R262 Difficulty in walking, not elsewhere classified: Secondary | ICD-10-CM | POA: Insufficient documentation

## 2021-10-29 DIAGNOSIS — R2681 Unsteadiness on feet: Secondary | ICD-10-CM | POA: Diagnosis present

## 2021-10-29 DIAGNOSIS — M6281 Muscle weakness (generalized): Secondary | ICD-10-CM | POA: Diagnosis present

## 2021-10-29 DIAGNOSIS — R269 Unspecified abnormalities of gait and mobility: Secondary | ICD-10-CM | POA: Insufficient documentation

## 2021-10-29 DIAGNOSIS — R2689 Other abnormalities of gait and mobility: Secondary | ICD-10-CM | POA: Diagnosis present

## 2021-10-29 DIAGNOSIS — I890 Lymphedema, not elsewhere classified: Secondary | ICD-10-CM | POA: Diagnosis present

## 2021-10-29 NOTE — Patient Instructions (Signed)

## 2021-10-29 NOTE — Therapy (Signed)
[place] MAIN [facility] SERVICES [address] [place], [place], [address] Phone: [phone]   Fax:  [phone]  Occupational Therapy Treatment Note and Progress Report: Lymphedema Care  Patient Details  Name: Mary [name] MRN: [id number] Date of Birth: [birth year] Referring Provider (OT): Pershing Cox, MD Reporting Period: 07/31/21 -10/28/21   Encounter Date: 10/28/2021   OT End of Session - 10/28/21 1525     Visit Number 20    Number of Visits 36    Date for OT Re-Evaluation 01/27/22    OT Start Time 0315    OT Stop Time 0420    OT Time Calculation (min) 65 min    Equipment Utilized During Treatment loaned Rosidal foam and 2 short stretch compression wraps as Pt forgot hers today. Loaned CircAid Juxtafit for LLE    Activity Tolerance Patient tolerated treatment well;No increased pain    Behavior During Therapy WFL for tasks assessed/performed             Past Medical History:  Diagnosis Date   Arrhythmia    Back pain    Depression with anxiety    Hip pain    Hyperlipidemia    Hypertension    Paroxysmal tachycardia ([place])     Past Surgical History:  Procedure Laterality Date   ABDOMINAL HYSTERECTOMY     BREAST BIOPSY     BREAST EXCISIONAL BIOPSY Left 2002   benign   CARDIAC CATHETERIZATION     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   x2   TOTAL HIP ARTHROPLASTY Bilateral    done twice    There were no vitals filed for this visit.   Subjective Assessment - 10/29/21 1803     Subjective  Mary [name] presents for OT Rx vist to address BLE lymphedema. Pt presents without compression legging in place. She brings LLE CircAid adjustable compression garment alternative for fitting. She is in agreement with plan to review progress towards all OT goals today as it's her 20th visit.    Pertinent History HTN, B TKA x 2, B THA x2, Diastolic heart failure, back pain, tachycardia    Limitations difficulty walking, decreased standing tolerance,  impaired transfers and functional mobility, impaired LB dressing, Limited L hip A/PROM, impaired dynamic balance, difficulty w/ LB dressing-fitting shoes, socks, pants    Repetition Increases Symptoms    Special Tests Intake FOTO score = 55/100; + [place]    Patient Stated Goals get leg swelling under control; I want to be able to get out and walk    Currently in Pain? No/denies    Pain Onset Other (comment)                 LYMPHEDEMA/ONCOLOGY QUESTIONNAIRE - 10/29/21 1805       Left Lower Extremity Lymphedema   Other LLE A-D volume= 4594.6  ml    Other LLE A-D volume INC by 9.23%                     OT Treatments/Exercises (OP) - 10/29/21 1805       ADLs   ADL Education Given Yes      Manual Therapy   Manual Therapy Edema management;Manual Lymphatic Drainage (MLD)    Compression Bandaging Max A to don new CircAid to maximize treatment time                    OT Education - 10/29/21 1806     Education Details Continued Pt/ CG  edu for lymphedema self care  and home program throughout session. Topics include multilayer, gradient compression wrapping, simple self-MLD, therapeutic lymphatic pumping exercises, skin/nail care, risk reduction factors and LE precautions, compression garments/recommendations and wear and care schedule and compression garment donning / doffing using assistive devices. All questions answered to the Pt's satisfaction. Pt demonstrates understanding by accurate return.    Person(s) Educated Patient    Methods Explanation;Demonstration;Handout    Comprehension Verbalized understanding;Returned demonstration;Need further instruction                 OT Long Term Goals - 10/28/21 1526       OT LONG TERM GOAL #1   Title Given this patient's low Intake score of 55/100 on the functional outcomes FOTO tool, patient will experience an increase in function of 2 points to improve basic and instrumental ADLs performance, including  lymphedema self-care.    Baseline 55/100    Time 12    Period Weeks    Status Not Met   Intake FOTO score: 55/100%. Final FOTO 5/51/23 = 52/100%. 3 point decrease overall. Goal not met.   Target Date --      OT LONG TERM GOAL #2   Title Pt will demonstrate understanding of lymphedema prevention strategies by identifying and discussing 5 precautions using printed reference (modified assistance) to reduce risk of progression and to limit infection risk.    Baseline Max A    Time 4    Period Days    Status Achieved    Target Date --   4th OT visit     OT LONG TERM GOAL #3   Title Pt will be able to apply knee length, multi-layer, short stretch compression wraps to one limb at a time using gradient techniques with MODIFIED INDEPENDENCE (extra time) to decrease limb volume, to limit infection risk, and to limit lymphedema progression. 08/31/21: Goal revised:   "With Maximum CG assistance"    Baseline Max A    Time 4    Period Days    Status Not Met   Patient's family member has learned to apply wraps using gradient techniques, however, limb is not consistently wrapped on a daily basis as is recommended for optimal volume reduction. 10/28/21: Pt never achieved compression wrap awpplication goal.   Target Date --   4th OT visit     OT LONG TERM GOAL #4   Title Pt will achieve at least a 10% limb volume reduction below the knee bilaterally to return limb to more normal size and shape, to limit infection risk, to decrease pain, to improve function, and to limit lymphedema progression.    Baseline Dependent    Time 12    Period Weeks    Status Not Met   LLE overall INCREASED by 9.23% in volume below the knee. GOAL not met due to inconsistent compression. No CG support as recommended.   Target Date 10/29/21      OT LONG TERM GOAL #5   Title Pt will achieve and sustain a least 85% compliance with all LE self-care home program components throughout Intensive Phase CDT, including daily skin inspection  and care, lymphatic pumping ther ex, 23/7 compression wraps and simple self-MLD, to sustain clinical gains made in CDT and to limit lymphedema progression and further functional decline.    Baseline D    Time 12    Period Weeks    Status Not Met   minimal compliance w LE self care home program between sessions  Target Date 10/29/21      OT LONG TERM GOAL #6   Title Pt will be able to perform all lymphedema self-care home program components using correct techniques with extra time and assistive devices PRN (modified independence), including simple self MLD, lymphatic pumping exercise, don and doff appropriate compression garments/ devices with Max A, and perform daily skin care regime to limit lymphedema progression and infection risk.    Baseline D    Time 12    Period Weeks    Status Not Met    Target Date 10/29/21                   Plan - 10/29/21 1750     Clinical Impression Statement Completed 20th visit BLE comparative limb volumetrics bilaterally to check status of limb volume reduction goal. unfortunately the LLE, MEASURED ON 09/24/21 was 9.82% LARGER IN VOLUME THAN IT WAS BELOW THE KNEE WHEN MEASURED INITIALLY ON 08/04/19  The LLE  today is decreased by 0.55%, and is increased by 9.23% overall. This value does not meet the 10% volume reduction goal., The RLE remains untreated. In reviewing goals , Pt had difficulty with several others. Please review the LONG TERM GOALS section of this note for detailed information. After reviewing goals we fit LLE off-the-shelf , adjustable , Circ Aid compression garment alternative (legging). The garment fits well and Pt is able to don garment and adjust compression correctly using measurement card- with extra time. I suspect we'll see improved limb volume reduction in the near future with this method of compression as it is more accessible to Pt. Cont OT 1 x weekly for 2 weeks , then reduce frequency to floow along and PRN  q 3-6 months.    OT  Occupational Profile and History Detailed Assessment- Review of Records and additional review of physical, cognitive, psychosocial history related to current functional performance    Occupational performance deficits (Please refer to evaluation for details): ADL's;IADL's;Rest and Sleep;Leisure;Social Participation;Work    Marketing executive / Function / Physical Skills ADL;ROM;IADL;Edema;Balance;Skin integrity;Mobility;Flexibility;Decreased knowledge of precautions;Decreased knowledge of use of DME    Rehab Potential Good    Clinical Decision Making Several treatment options, min-mod task modification necessary    Comorbidities Affecting Occupational Performance: Presence of comorbidities impacting occupational performance    Modification or Assistance to Complete Evaluation  Min-Moderate modification of tasks or assist with assess necessary to complete eval    OT Frequency 2x / week    OT Duration 12 weeks    OT Treatment/Interventions Self-care/ADL training;DME and/or AE instruction;Manual lymph drainage;Compression bandaging;Therapeutic activities;Coping strategies training;Therapeutic exercise;Patient/family education;Manual Therapy    Consulted and Agree with Plan of Care Patient             Patient will benefit from skilled therapeutic intervention in order to improve the following deficits and impairments:   Body Structure / Function / Physical Skills: ADL, ROM, IADL, Edema, Balance, Skin integrity, Mobility, Flexibility, Decreased knowledge of precautions, Decreased knowledge of use of DME       Visit Diagnosis: Lymphedema, not elsewhere classified - Plan: Ot plan of care cert/re-cert    Problem List Patient Active Problem List   Diagnosis Date Noted   Diastolic heart failure ([place]) 07/30/2021   Hyperlipidemia 07/30/2021   Lymphedema 07/30/2021   Tachycardia 06/02/2021   Gait instability 06/02/2021   Primary hypertension 06/02/2021    Andrey Spearman, MS, OTR/L,  CLT-LANA 10/29/21 6:12 PM   [place] [facility]  SERVICES [place], [place], [address] Phone: [phone]   Fax:  [phone]  Name: Mary [name] MRN: [id number] Date of Birth: [birth year]

## 2021-10-29 NOTE — Therapy (Signed)
[place] MAIN [facility] SERVICES [address] [place], [place], [address] Phone: [phone]   Fax:  [phone]  Physical Therapy Treatment  Patient Details  Name: Mary [name] MRN: [id number] Date of Birth: June 04, 1940 Referring Provider (PT): Charlynne Cousins, MD   Encounter Date: 10/29/2021   PT End of Session - 10/29/21 0939     Visit Number 21    Number of Visits 43    Date for PT Re-Evaluation 01/20/22    Authorization Type Medicare Primary, AARP Secondary    Authorization Time Period 08/04/21-10/27/21; 10/28/2021- 01/20/2022    Progress Note Due on Visit 20    PT Start Time 0932    PT Stop Time 1014    PT Time Calculation (min) 42 min    Equipment Utilized During Treatment Gait belt    Activity Tolerance Patient tolerated treatment well;No increased pain    Behavior During Therapy WFL for tasks assessed/performed             Past Medical History:  Diagnosis Date   Arrhythmia    Back pain    Depression with anxiety    Hip pain    Hyperlipidemia    Hypertension    Paroxysmal tachycardia ([place])     Past Surgical History:  Procedure Laterality Date   ABDOMINAL HYSTERECTOMY     BREAST BIOPSY     BREAST EXCISIONAL BIOPSY Left 2002   benign   CARDIAC CATHETERIZATION     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   x2   TOTAL HIP ARTHROPLASTY Bilateral    done twice    There were no vitals filed for this visit.   Subjective Assessment - 10/29/21 0938     Subjective Patient reports doing okay today. She reports a little soreness in LLE knee after yesterday but states its better today.    Pertinent History Pt is a pleasant 81 y/o female referred to PT for gait instability. Pt ambulates with RW. She reports onset of gait instability started several years ago. 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 Pt has had PT in past following surgeries. Pt reports hx of frequent  falls. She has had 2 falls in past 6 months. Pt believes falls are due to difficulty picking up L foot and dragging L foot. Pt reports no injury with falls.  Pt being seen by OT for lymphedema, reports LLE more affected than RLE. Pt currently lives alone. Her spouse passed away recently in 07/01/22. Her adult son and DIL live nearby and can assist her. Pt denies any dizziness or lightheadedness. Pt reports no pain currently. 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    Limitations Lifting;Standing;Walking;House hold activities    How long can you sit comfortably? not limited    How long can you stand comfortably? Pt reports only limited due to need to stretch lower back    How long can you walk comfortably? At least 30 minutes    Diagnostic tests per chart recent imaging (February) of hip/knee unremarkable.    Patient Stated Goals Pt would like to be able to walk without a cane or at least not have to use her RW.    Currently in Pain? No/denies              TREATMENT: Warm up on Crosstrainer level 2 x4  min, seat #5 (unbilled);  Ex:  Leg press: BLE 55# x10 reps, moderate difficulty RLE only 40# x10 x2 sets; Pt required mod A to get LE onto machine. She exhibits decreased hip/knee ROM which limits tolerance on machine; She denies any increase in pain but does report moderate difficulty; Required cues to slow down LE movement for better motor control;   Standing on step: -forward toe tap from 1st step to 2nd step against green tband for hip flexor strengthening, challenging gluteal activation in stance leg x15 reps each LE with BUE rail assist  Standing on firm surface with verbal/tactile cues to achieve neutral positioning  Progressed to trunk rotation x5 reps each with cues for increased ROM and erect posture to challenge stretch in trunk/hip;    Forward backward step over 1/2 bolster with 2-1 rail assist x10  reps;  Seated LAQ with ankle DF x10 reps  RLE: knee extension: -7 degrees   Reinforced HEP with instruction to work on increasing SLS at home for better stance control and strengthening;   Patient tolerated session well, denies any pain at end of session;                              PT Education - 10/29/21 0939     Education Details exercise technique/positioning;    Person(s) Educated Patient    Methods Explanation;Verbal cues    Comprehension Verbalized understanding;Returned demonstration;Verbal cues required;Need further instruction              PT Short Term Goals - 09/02/21 1449       PT SHORT TERM GOAL #1   Title Pt will be independent with HEP in order to improve strength and balance in order to decrease fall risk and improve function at home and work.    Baseline 3/7: provided pt with initial HEP 4/5; compliant    Time 6    Period Weeks    Status Achieved    Target Date 09/15/21               PT Long Term Goals - 10/28/21 1410       PT LONG TERM GOAL #1   Title Patient will increase FOTO score to equal to or greater than 52 to demonstrate statistically significant improvement in mobility and quality of life.    Baseline 3/7: 50 4/5: 55%; 10/28/2021=53%    Time 12    Period Weeks    Status Achieved    Target Date 10/27/21      PT LONG TERM GOAL #2   Title Pt will improve BERG by at least 3 points in order to demonstrate clinically significant improvement in balance.    Baseline 3/7: 40/56 4/5: 44/56    Time 12    Period Weeks    Status Achieved    Target Date 10/27/21      PT LONG TERM GOAL #3   Title Pt will decrease 5TSTS by at least 3 seconds in order to demonstrate clinically significant improvement in LE strength.    Baseline 3/7: 15.36 sec with use of BUEs to assist 4/5: 9.6 seconds SUE support    Time 12    Period Weeks    Status Achieved    Target Date 10/27/21      PT LONG TERM GOAL #4   Title Pt will  improve ABC by at least 13% in order to demonstrate clinically significant improvement in balance  confidence.    Baseline 3/7: 50.63% 4/5: 62% 5/31= 63%    Time 12    Period Weeks    Status Partially Met    Target Date 01/20/22      PT LONG TERM GOAL #5   Title Patient will increase 10 meter walk test to >1.54ms as to improve gait speed for better community ambulation and to reduce fall risk.    Baseline 3/7: 0.75 m/s with 4WW 4/5: 0.96 m/s with 4 WW; 10/28/2021= 0.97 m/s with 4WW    Time 12    Period Weeks    Status Partially Met    Target Date 01/20/22      Additional Long Term Goals   Additional Long Term Goals Yes      PT LONG TERM GOAL #6   Title Patient will demonstrate improved balance by exhibiting ability to single leg stance > 3 sec consistently without UE support for improved dynamic balance.    Baseline 10/28/2021= 1 sec each LE prior to requiring UE support    Time 12    Period Weeks    Status New    Target Date 01/20/22      PT LONG TERM GOAL #7   Title Patient will present with improved right knee AROM <10 deg knee ext from zero for decreased limp and leg length discrepancy to aide in walking and weight shifting.    Baseline 10/28/2021= 15 deg from zero (right knee ext)    Time 12    Period Weeks    Status New    Target Date 01/20/22      PT LONG TERM GOAL #8   Title Patient will demo modified Ind floor to sit transfer using chair for improved ability to get up off the floor for decreased caregiver or EMS Support.    Baseline 10/28/2021= Not formally assessed yet patient reports difficulty and desire to practice to be able to perform on her own.    Time 12    Period Weeks    Status New    Target Date 01/20/22                   Plan - 10/29/21 0947     Clinical Impression Statement Patient motivated and participated well within session. She was instructed in advanced LE ROM/strengthening exercise. Patient does require min A for proper positioning and  min VCS for proper exercise technique. She required cues to slow down LE movement for better motor control. Patient initially reports soreness with increased stretch/ROM but was able to tolerate increased ROM with increased repetition. Patient would benefit from additional skilled PT Intervention to improve strength, ROM and mobility while also improving balance and gait safety;    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, B THA x2, diastolic heart failure, back pain, tachycardia    Examination-Activity Limitations Bend;Reach Overhead;Stairs;Transfers;Lift;Squat;Locomotion Level;Carry;Stand;Dressing    Examination-Participation Restrictions Volunteer;Community Activity;Shop;Cleaning;Laundry;Yard Work    SMerchant navy officerEvolving/Moderate complexity    Rehab Potential Good    PT Frequency 2x / week    PT Duration 12 weeks    PT Treatment/Interventions ADLs/Self Care Home Management;Aquatic Therapy;Biofeedback;Canalith Repostioning;Cryotherapy;Electrical Stimulation;Moist Heat;Traction;Ultrasound;DME Instruction;Gait training;Functional mBiochemist, clinicalTherapeutic activities;Therapeutic exercise;Balance training;Neuromuscular re-education;Patient/family education;Orthotic Fit/Training;Wheelchair mobility training;Manual techniques;Passive range of motion;Scar mobilization;Dry needling;Energy conservation;Splinting;Taping;Vestibular;Visual/perceptual remediation/compensation;Joint Manipulations    PT Next Visit Plan strengthening, gait, balance, ROM- manual therapy for Improved ROM RIght knee.  PT Home Exercise Plan Access Code: [email or web address]; 5/1: Access Code: [email or web address]; 10/28/2021=Access Code: [email or web address]  URL: [email or web address]    Consulted and Agree with Plan of Care Patient             Patient will benefit from skilled  therapeutic intervention in order to improve the following deficits and impairments:  Abnormal gait, Decreased balance, Decreased endurance, Decreased mobility, Difficulty walking, Hypomobility, Decreased range of motion, Improper body mechanics, Decreased activity tolerance, Decreased coordination, Decreased strength, Postural dysfunction  Visit Diagnosis: Muscle weakness (generalized)  Difficulty in walking, not elsewhere classified  Unsteadiness on feet  Other abnormalities of gait and mobility  Other lack of coordination  Abnormality of gait and mobility     Problem List Patient Active Problem List   Diagnosis Date Noted   Diastolic heart failure ([place]) 07/30/2021   Hyperlipidemia 07/30/2021   Lymphedema 07/30/2021   Tachycardia 06/02/2021   Gait instability 06/02/2021   Primary hypertension 06/02/2021    Mary [name], PT, DPT 10/29/2021, 11:45 AM  [place] [address] [place], [place], [address] Phone: [phone]   Fax:  [phone]  Name: Mary [name] MRN: [id number] Date of Birth: January 07, 1941

## 2021-11-03 ENCOUNTER — Ambulatory Visit: Payer: Medicare Other | Admitting: Physical Therapy

## 2021-11-03 ENCOUNTER — Ambulatory Visit (INDEPENDENT_AMBULATORY_CARE_PROVIDER_SITE_OTHER): Payer: Medicare Other | Admitting: *Deleted

## 2021-11-03 ENCOUNTER — Encounter: Payer: Self-pay | Admitting: Physical Therapy

## 2021-11-03 VITALS — BP 133/69 | HR 68 | Wt 182.2 lb

## 2021-11-03 DIAGNOSIS — R2689 Other abnormalities of gait and mobility: Secondary | ICD-10-CM

## 2021-11-03 DIAGNOSIS — Z780 Asymptomatic menopausal state: Secondary | ICD-10-CM | POA: Diagnosis not present

## 2021-11-03 DIAGNOSIS — Z0000 Encounter for general adult medical examination without abnormal findings: Secondary | ICD-10-CM | POA: Diagnosis not present

## 2021-11-03 DIAGNOSIS — R2681 Unsteadiness on feet: Secondary | ICD-10-CM

## 2021-11-03 DIAGNOSIS — Z1231 Encounter for screening mammogram for malignant neoplasm of breast: Secondary | ICD-10-CM | POA: Diagnosis not present

## 2021-11-03 DIAGNOSIS — R262 Difficulty in walking, not elsewhere classified: Secondary | ICD-10-CM

## 2021-11-03 DIAGNOSIS — M6281 Muscle weakness (generalized): Secondary | ICD-10-CM

## 2021-11-03 DIAGNOSIS — R278 Other lack of coordination: Secondary | ICD-10-CM

## 2021-11-03 DIAGNOSIS — R269 Unspecified abnormalities of gait and mobility: Secondary | ICD-10-CM

## 2021-11-03 NOTE — Progress Notes (Signed)
Subjective:   Mary [name] is a 81 y.o. female who presents for Medicare Annual (Subsequent) preventive examination.  I connected with  Mary [name] on 11/03/21 by a telephone enabled telemedicine application and verified that I am speaking with the correct person using two identifiers.   I discussed the limitations of evaluation and management by telemedicine. The patient expressed understanding and agreed to proceed.  Patient location: home  Provider location: Tele-Health- home    Review of Systems     Cardiac Risk Factors include: advanced age (>6mn, >>60women);obesity (BMI >30kg/m2);hypertension     Objective:    Today's Vitals   11/03/21 1202 11/03/21 1212  PainSc: 0-No pain 0-No pain   There is no height or weight on file to calculate BMI.     11/03/2021   12:24 PM 07/31/2021   10:08 AM  Advanced Directives  Does Patient Have a Medical Advance Directive? Yes Yes  Type of Advance Directive Healthcare Power of [place]  CNorth Englishin [name]? No - copy requested     Current Medications (verified) Outpatient Encounter Medications as of 11/03/2021  Medication Sig   buPROPion (WELLBUTRIN SR) 150 MG 12 hr tablet TAKE 1 TABLET(150 MG) BY MOUTH TWICE DAILY   Cholecalciferol (VITAMIN D3) 25 MCG (1000 UT) CAPS Take 1,000 Units by mouth daily.   furosemide (LASIX) 20 MG tablet Take 1 tablet (20 mg total) by mouth every other day.   LORazepam (ATIVAN) 1 MG tablet Take 1 mg by mouth 3 (three) times daily as needed for anxiety.   metoprolol succinate (TOPROL-XL) 25 MG 24 hr tablet Take 1 tablet (25 mg total) by mouth daily.   pravastatin (PRAVACHOL) 40 MG tablet Take 1 tablet (40 mg total) by mouth daily.   sertraline (ZOLOFT) 50 MG tablet TAKE 1 TABLET(50 MG) BY MOUTH DAILY   fexofenadine (ALLEGRA ALLERGY) 180 MG tablet Take 1 tablet (180 mg total) by mouth daily. (Patient not taking: Reported on 11/03/2021)   gabapentin (NEURONTIN) 300 MG  capsule Take 300 mg by mouth 3 (three) times daily. (Patient not taking: Reported on 11/03/2021)   meloxicam (MOBIC) 7.5 MG tablet Mobic 7.5 mg tablet  Take 1 tablet every day by oral route. (Patient not taking: Reported on 11/03/2021)   traMADol (ULTRAM) 50 MG tablet Take 50 mg by mouth every 6 (six) hours as needed. (Patient not taking: Reported on 11/03/2021)   No facility-administered encounter medications on file as of 11/03/2021.    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   x2   TOTAL HIP ARTHROPLASTY Bilateral    done twice   Family History  Problem Relation Age of Onset   Hypertension Mother    CAD Mother    Pancreatic cancer Mother    Lung cancer Father    Hypertension Sister    Multiple sclerosis Sister    Breast cancer Neg Hx    Social History   Socioeconomic History  Marital status: Widowed    Spouse name: Not on file   Number of children: Not on file   Years of education: Not on file   Highest education level: Not on file  Occupational History   Not on file  Tobacco Use   Smoking status: Former    Types: Cigarettes   Smokeless tobacco: Never  Vaping Use   Vaping Use: Never used  Substance and Sexual Activity   Alcohol use: Not Currently   Drug use: Never   Sexual activity: Not Currently  Other Topics Concern   Not on file  Social History Narrative   Not on file   Social Determinants of Health   Financial Resource Strain: Low Risk    Difficulty of Paying Living Expenses: Not hard at all  Food Insecurity: No Food Insecurity    Worried About Charity fundraiser in the Last Year: Never true   [place] in the Last Year: Never true  Transportation Needs: No Transportation Needs   Lack of Transportation (Medical): No   Lack of Transportation (Non-Medical): No  Physical Activity: Inactive   Days of Exercise per Week: 0 days   Minutes of Exercise per Session: 0 min  Stress: No Stress Concern Present   Feeling of Stress : Not at all  Social Connections: Moderately Integrated   Frequency of Communication with Friends and Family: More than three times a week   Frequency of Social Gatherings with Friends and Family: Twice a week   Attends Religious Services: More than 4 times per year   Active Member of [employer] or Organizations: Yes   Attends Archivist Meetings: More than 4 times per year   Marital Status: Widowed    Tobacco Counseling Counseling given: Not Answered   Clinical Intake:  Pre-visit preparation completed: Yes  Pain : No/denies pain Pain Score: 0-No pain     Nutritional Risks: None Diabetes: No  How often do you need to have someone help you when you read instructions, pamphlets, or other written materials from your doctor or pharmacy?: 1 - Never  Diabetic?  no  Interpreter Needed?: No  Information entered by :: Mary Kennedy LPN   Activities of Daily Living    11/03/2021   12:25 PM  In your present state of health, do you have any difficulty performing the following activities:  Hearing? 0  Vision? 0  Difficulty concentrating or making decisions? 0  Walking or climbing stairs? 1  Dressing or bathing? 0  Doing errands, shopping? 0  Preparing Food and eating ? N  Using the Toilet? N  In the past six months, have you accidently leaked urine? Y  Do you have problems with loss of bowel control? N  Managing your Medications? N  Managing your Finances? N  Housekeeping or managing your Housekeeping? N    Patient Care Team: Mary Roys, DO as PCP - General (Family  Medicine) Mary Merritts, MD as Consulting Physician (Cardiology)  Indicate any recent Medical Services you may have received from other than [facility] providers in the past year (date may be approximate).     Assessment:   This is a routine wellness examination for Mary [name].  Hearing/Vision screen Hearing Screening - Comments:: No trouble hearing Vision Screening - Comments:: Not up to date [facility]    Dietary issues and exercise activities discussed: Current Exercise Habits: The patient does not participate in regular exercise at present (physical therapy 2 days a week.), Intensity: Mild, Exercise  limited by: orthopedic condition(s)   Goals Addressed             This Visit's Progress    Patient Stated       Would like to be able to walk without a walker       Depression Screen    11/03/2021   12:28 PM 07/30/2021   10:55 AM 07/02/2021    3:05 PM  PHQ 2/9 Scores  PHQ - 2 Score 0 3 2  PHQ- 9 Score  4 4    Fall Risk    11/03/2021   12:14 PM 07/30/2021   10:55 AM 07/02/2021    3:05 PM  Fall Risk   Falls in the past year? 1 0 1  Number falls in past yr: 0 0 1  Injury with Fall? 0 0 1  Risk for fall due to : Impaired balance/gait No Fall Risks History of fall(s);Impaired balance/gait  Follow up Falls evaluation completed;Education provided;Falls prevention discussed Falls evaluation completed Falls evaluation completed    FALL RISK PREVENTION PERTAINING TO THE HOME:  Any stairs in or around the home? No  If so, are there any without handrails? No  Home free of loose throw rugs in walkways, pet beds, electrical cords, etc? Yes  Adequate lighting in your home to reduce risk of falls? Yes   ASSISTIVE DEVICES UTILIZED TO PREVENT FALLS:  Life alert? No  Use of a cane, walker or w/c? Yes  Grab bars in the bathroom? Yes  Shower chair or bench in shower? Yes  Elevated toilet seat or a handicapped toilet? Yes   TIMED UP AND GO:    Cognitive Function:        11/03/2021   12:21  PM  6CIT Screen  What Year? 0 points  What month? 0 points  What time? 0 points  Count back from 20 0 points  Months in reverse 0 points  Repeat phrase 0 points  Total Score 0 points    Immunizations  There is no immunization history on file for this patient.  TDAP status: Due, Education has been provided regarding the importance of this vaccine. Advised may receive this vaccine at local pharmacy or Health Dept. Aware to provide a copy of the vaccination record if obtained from local pharmacy or Health Dept. Verbalized acceptance and understanding.  Flu Vaccine status: Due, Education has been provided regarding the importance of this vaccine. Advised may receive this vaccine at local pharmacy or Health Dept. Aware to provide a copy of the vaccination record if obtained from local pharmacy or Health Dept. Verbalized acceptance and understanding.  Pneumococcal  patient is unsure if she has had two .  Had Vaccine in [place]  will discuss with MD at upcoming visit  Covid-19 vaccine status: Information provided on how to obtain vaccines.   Qualifies for Shingles Vaccine? Yes   Zostavax completed Yes   Shingrix Completed?: No.    Education has been provided regarding the importance of this vaccine. Patient has been advised to call insurance company to determine out of pocket expense if they have not yet received this vaccine. Advised may also receive vaccine at local pharmacy or Health Dept. Verbalized acceptance and understanding.  Screening Tests Health Maintenance  Topic Date Due   Hepatitis C Screening  Never done   DEXA SCAN  Never done   COVID-19 Vaccine (3 - Booster) 11/19/2021 (Originally 11/15/2019)   Zoster Vaccines- Shingrix (1 of 2) 02/03/2022 (Originally 12/01/1992)   Pneumonia Vaccine 65+  Years old (1 - PCV) 11/04/2022 (Originally 12/01/1948)   TETANUS/TDAP  11/04/2022 (Originally 12/01/1961)   INFLUENZA VACCINE  12/29/2021   HPV VACCINES  Aged Out    Health  Maintenance  Health Maintenance Due  Topic Date Due   Hepatitis C Screening  Never done   DEXA SCAN  Never done   Colonoscopy no longer required.  Mammogram status: Ordered  . Pt provided with contact info and advised to call to schedule appt.   Bone Density status: Ordered  . Pt provided with contact info and advised to call to schedule appt.  Lung Cancer Screening: (Low Dose CT Chest recommended if Age 59-80 years, 30 pack-year currently smoking OR have quit w/in 15years.) does not qualify.   Lung Cancer Screening Referral:   Additional Screening:  Hepatitis C Screening: does not qualify;  Vision Screening: Recommended annual ophthalmology exams for early detection of glaucoma and other disorders of the eye. Is the patient up to date with their annual eye exam?  Yes  Who is the provider or what is the name of the office in which the patient attends annual eye exams? [facility] If pt is not established with a provider, would they like to be referred to a provider to establish care? No .   Dental Screening: Recommended annual dental exams for proper oral hygiene  Community Resource Referral / Chronic Care Management: CRR required this visit?  No   CCM required this visit?  No      Plan:     I have personally reviewed and noted the following in the patient's [name]:   Medical and social history Use of alcohol, tobacco or illicit drugs  Current medications and supplements including opioid prescriptions.  Functional ability and status Nutritional status Physical activity Advanced directives List of other physicians Hospitalizations, surgeries, and ER visits in previous 12 months Vitals Screenings to include cognitive, depression, and falls Referrals and appointments  In addition, I have reviewed and discussed with patient certain preventive protocols, quality metrics, and best practice recommendations. A written personalized care plan for preventive services as well as  general preventive health recommendations were provided to patient.     Mary Kennedy, LPN   09/03/2701   Nurse Notes:

## 2021-11-03 NOTE — Patient Instructions (Addendum)
Ms. Mary [name] , Thank you for taking time to come for your Medicare Wellness Visit. I appreciate your ongoing commitment to your health goals. Please review the following plan we discussed and let me know if I can assist you in the future.   Screening recommendations/referrals: Colonoscopy: Education provided Mammogram: Education provided ordered Bone Density: Education provided ordered Recommended yearly ophthalmology/optometry visit for glaucoma screening and checkup Recommended yearly dental visit for hygiene and checkup  Vaccinations: Influenza vaccine: Education provided Pneumococcal vaccine: Education provided Tdap vaccine: Education provided Shingles vaccine: Education provided    Advanced directives: not on file  Conditions/risks identified:   Next appointment: 12-16-2021 @ 11:20  [facility] 79 Years and Older, Female Preventive care refers to lifestyle choices and visits with your health care provider that can promote health and wellness. What does preventive care include? A yearly physical exam. This is also called an annual well check. Dental exams once or twice a year. Routine eye exams. Ask your health care provider how often you should have your eyes checked. Personal lifestyle choices, including: Daily care of your teeth and gums. Regular physical activity. Eating a healthy diet. Avoiding tobacco and drug use. Limiting alcohol use. Practicing safe sex. Taking low-dose aspirin every day. Taking vitamin and mineral supplements as recommended by your health care provider. What happens during an annual well check? The services and screenings done by your health care provider during your annual well check will depend on your age, overall health, lifestyle risk factors, and family history of disease. Counseling  Your health care provider may ask you questions about your: Alcohol use. Tobacco use. Drug use. Emotional well-being. Home and relationship  well-being. Sexual activity. Eating habits. History of falls. Memory and ability to understand (cognition). Work and work Statistician. Reproductive health. Screening  You may have the following tests or measurements: Height, weight, and BMI. Blood pressure. Lipid and cholesterol levels. These may be checked every 5 years, or more frequently if you are over 79 years old. Skin check. Lung cancer screening. You may have this screening every year starting at age 79 if you have a 30-pack-year history of smoking and currently smoke or have quit within the past 15 years. Fecal occult blood test (FOBT) of the stool. You may have this test every year starting at age 79. Flexible sigmoidoscopy or colonoscopy. You may have a sigmoidoscopy every 5 years or a colonoscopy every 10 years starting at age 79. Hepatitis C blood test. Hepatitis B blood test. Sexually transmitted disease (STD) testing. Diabetes screening. This is done by checking your blood sugar (glucose) after you have not eaten for a while (fasting). You may have this done every 1-3 years. Bone density scan. This is done to screen for osteoporosis. You may have this done starting at age 79. Mammogram. This may be done every 1-2 years. Talk to your health care provider about how often you should have regular mammograms. Talk with your health care provider about your test results, treatment options, and if necessary, the need for more tests. Vaccines  Your health care provider may recommend certain vaccines, such as: Influenza vaccine. This is recommended every year. Tetanus, diphtheria, and acellular pertussis (Tdap, Td) vaccine. You may need a Td booster every 10 years. Zoster vaccine. You may need this after age 36. Pneumococcal 13-valent conjugate (PCV13) vaccine. One dose is recommended after age 45. Pneumococcal polysaccharide (PPSV23) vaccine. One dose is recommended after age 26. Talk to your health care provider about which  screenings and vaccines you need and how often you need them. This information is not intended to replace advice given to you by your health care provider. Make sure you discuss any questions you have with your health care provider. Document Released: 06/13/2015 Document Revised: 02/04/2016 Document Reviewed: 03/18/2015 Elsevier Interactive Patient Education  2017 [place] Prevention in the Home Falls can cause injuries. They can happen to people of all ages. There are many things you can do to make your home safe and to help prevent falls. What can I do on the outside of my home? Regularly fix the edges of walkways and driveways and fix any cracks. Remove anything that might make you trip as you walk through a door, such as a raised step or threshold. Trim any bushes or trees on the path to your home. Use bright outdoor lighting. Clear any walking paths of anything that might make someone trip, such as rocks or tools. Regularly check to see if handrails are loose or broken. Make sure that both sides of any steps have handrails. Any raised decks and porches should have guardrails on the edges. Have any leaves, snow, or ice cleared regularly. Use sand or salt on walking paths during winter. Clean up any spills in your garage right away. This includes oil or grease spills. What can I do in the bathroom? Use night lights. Install grab bars by the toilet and in the tub and shower. Do not use towel bars as grab bars. Use non-skid mats or decals in the tub or shower. If you need to sit down in the shower, use a plastic, non-slip stool. Keep the floor dry. Clean up any water that spills on the floor as soon as it happens. Remove soap buildup in the tub or shower regularly. Attach bath mats securely with double-sided non-slip rug tape. Do not have throw rugs and other things on the floor that can make you trip. What can I do in the bedroom? Use night lights. Make sure that you have a  light by your bed that is easy to reach. Do not use any sheets or blankets that are too big for your bed. They should not hang down onto the floor. Have a firm chair that has side arms. You can use this for support while you get dressed. Do not have throw rugs and other things on the floor that can make you trip. What can I do in the kitchen? Clean up any spills right away. Avoid walking on wet floors. Keep items that you use a lot in easy-to-reach places. If you need to reach something above you, use a strong step stool that has a grab bar. Keep electrical cords out of the way. Do not use floor polish or wax that makes floors slippery. If you must use wax, use non-skid floor wax. Do not have throw rugs and other things on the floor that can make you trip. What can I do with my stairs? Do not leave any items on the stairs. Make sure that there are handrails on both sides of the stairs and use them. Fix handrails that are broken or loose. Make sure that handrails are as long as the stairways. Check any carpeting to make sure that it is firmly attached to the stairs. Fix any carpet that is loose or worn. Avoid having throw rugs at the top or bottom of the stairs. If you do have throw rugs, attach them to the floor with carpet tape.  Make sure that you have a light switch at the top of the stairs and the bottom of the stairs. If you do not have them, ask someone to add them for you. What else can I do to help prevent falls? Wear shoes that: Do not have high heels. Have rubber bottoms. Are comfortable and fit you well. Are closed at the toe. Do not wear sandals. If you use a stepladder: Make sure that it is fully opened. Do not climb a closed stepladder. Make sure that both sides of the stepladder are locked into place. Ask someone to hold it for you, if possible. Clearly mark and make sure that you can see: Any grab bars or handrails. First and last steps. Where the edge of each step  is. Use tools that help you move around (mobility aids) if they are needed. These include: Canes. Walkers. Scooters. Crutches. Turn on the lights when you go into a dark area. Replace any light bulbs as soon as they burn out. Set up your furniture so you have a clear path. Avoid moving your furniture around. If any of your floors are uneven, fix them. If there are any pets around you, be aware of where they are. Review your medicines with your doctor. Some medicines can make you feel dizzy. This can increase your chance of falling. Ask your doctor what other things that you can do to help prevent falls. This information is not intended to replace advice given to you by your health care provider. Make sure you discuss any questions you have with your health care provider. Document Released: 03/13/2009 Document Revised: 10/23/2015 Document Reviewed: 06/21/2014 Elsevier Interactive Patient Education  2017 [employer].

## 2021-11-03 NOTE — Therapy (Signed)
[facility] MAIN [facility] SERVICES [address] [place], [place], [address] Phone: [phone]   Fax:  [phone]  Physical Therapy Treatment  Patient Details  Name: Anessa Galanis MRN: [id number] Date of Birth: 1941/03/25 Referring Provider (PT): Loura Pardon, MD   Encounter Date: 11/03/2021   PT End of Session - 11/03/21 1353     Visit Number 22    Number of Visits 44    Date for PT Re-Evaluation 01/20/22    Authorization Type Medicare Primary, A[employer] Secondary    Authorization Time Period 08/04/21-10/27/21; 10/28/2021- 01/20/2022    Progress Note Due on Visit 20    PT Start Time 1346    PT Stop Time 1430    PT Time Calculation (min) 44 min    Equipment Utilized During Treatment Gait belt    Activity Tolerance Patient tolerated treatment well;No increased pain    Behavior During Therapy WFL for tasks assessed/performed             Past Medical History:  Diagnosis Date   Arrhythmia    Back pain    Depression with anxiety    Hip pain    Hyperlipidemia    Hypertension    Neuromuscular disorder (HCC)    Paroxysmal tachycardia (HCC)     Past Surgical History:  Procedure Laterality Date   ABDOMINAL HYSTERECTOMY     BREAST BIOPSY     BREAST EXCISIONAL BIOPSY Left 06-24-2000   benign   CARDIAC CATHETERIZATION     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   x2   TOTAL HIP ARTHROPLASTY Bilateral    done twice    There were no vitals filed for this visit.   Subjective Assessment - 11/03/21 1353     Subjective Patient reports doing well. She feels that she may be standing a little straighter. She still feels some unsteadiness when walking.    Pertinent History Pt is a pleasant 81 y/o female referred to PT for gait instability. Pt ambulates with RW. She reports onset of gait instability started several years ago. 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 Pt has had PT in past  following surgeries. Pt reports hx of frequent falls. She has had 2 falls in past 6 months. Pt believes falls are due to difficulty picking up L foot and dragging L foot. Pt reports no injury with falls.  Pt being seen by OT for lymphedema, reports LLE more affected than RLE. Pt currently lives alone. Her spouse passed away recently in 06-25-23. Her adult son and DIL live nearby and can assist her. Pt denies any dizziness or lightheadedness. Pt reports no pain currently. 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    Limitations Lifting;Standing;Walking;House hold activities    How long can you sit comfortably? not limited    How long can you stand comfortably? Pt reports only limited due to need to stretch lower back    How long can you walk comfortably? At least 30 minutes    Diagnostic tests per chart recent imaging (February) of hip/knee unremarkable.    Patient Stated Goals Pt would like to be able to walk without a cane or at least not have to use her RW.    Currently in Pain? No/denies  TREATMENT: Patient supine on mat table: Initial RLE knee ROM: extension: -8 degrees PT performed passive RLE hamstring stretch 30 sec hold x2 reps PT performed passive RLE hip IR for ER stretch 15 sec hold x3-4 reps PT performed passive LLE hip ER for IR stretch 15 sec hold x3=4 reps PT performed grade II-III AP mobs to right femur on tibia 15 sec bouts x3-4 sets  Instructed patient in RLE quad set 3 sec hold x10 reps Following quad sets, RLE knee extension: -4 degrees   Seated: Hamstring curl green tband x15 reps each LE with cues to avoid hip ER on RLE for better knee flexion ROM and better muscle strengthening;  LAQ RLE only 5 sec hold x5 reps with good positioning/control;   Ex:  Leg press: BLE 70# x15 reps, min difficulty RLE only 40# x15 reps; Pt required min A to get LE onto machine. She exhibits  decreased hip/knee ROM which limits tolerance on machine; She was able to get LE positioned easier compared to previous sessions requiring less compensation.    Standing on airex pad: Alternate toe taps to 6 inch step with 1 rail assist x10 reps each LE Standing one foot on airex, one foot on 8 inch step (2nd step)   Unsupported 30 sec hold x2 reps each LE Pt able to exhibit good hip position with minimal trendelenburg; She did require min tactile cues to avoid forward hip rotation for better positioning;    Reinforced HEP with instruction to work on increasing SLS at home for better stance control and strengthening;    Patient tolerated session well, denies any pain at end of session;                           PT Short Term Goals - 09/02/21 1449       PT SHORT TERM GOAL #1   Title Pt will be independent with HEP in order to improve strength and balance in order to decrease fall risk and improve function at home and work.    Baseline 3/7: provided pt with initial HEP 4/5; compliant    Time 6    Period Weeks    Status Achieved    Target Date 09/15/21               PT Long Term Goals - 10/28/21 1410       PT LONG TERM GOAL #1   Title Patient will increase FOTO score to equal to or greater than 52 to demonstrate statistically significant improvement in mobility and quality of life.    Baseline 3/7: 50 4/5: 55%; 10/28/2021=53%    Time 12    Period Weeks    Status Achieved    Target Date 10/27/21      PT LONG TERM GOAL #2   Title Pt will improve BERG by at least 3 points in order to demonstrate clinically significant improvement in balance.    Baseline 3/7: 40/56 4/5: 44/56    Time 12    Period Weeks    Status Achieved    Target Date 10/27/21      PT LONG TERM GOAL #3   Title Pt will decrease 5TSTS by at least 3 seconds in order to demonstrate clinically significant improvement in LE strength.    Baseline 3/7: 15.36 sec with use of BUEs to assist  4/5: 9.6 seconds SUE support    Time 12    Period Weeks  Status Achieved    Target Date 10/27/21      PT LONG TERM GOAL #4   Title Pt will improve ABC by at least 13% in order to demonstrate clinically significant improvement in balance confidence.    Baseline 3/7: 50.63% 4/5: 62% 5/31= 63%    Time 12    Period Weeks    Status Partially Met    Target Date 01/20/22      PT LONG TERM GOAL #5   Title Patient will increase 10 meter walk test to >1.57m/s as to improve gait speed for better community ambulation and to reduce fall risk.    Baseline 3/7: 0.75 m/s with 4WW 4/5: 0.96 m/s with 4 WW; 10/28/2021= 0.97 m/s with 4WW    Time 12    Period Weeks    Status Partially Met    Target Date 01/20/22      Additional Long Term Goals   Additional Long Term Goals Yes      PT LONG TERM GOAL #6   Title Patient will demonstrate improved balance by exhibiting ability to single leg stance > 3 sec consistently without UE support for improved dynamic balance.    Baseline 10/28/2021= 1 sec each LE prior to requiring UE support    Time 12    Period Weeks    Status New    Target Date 01/20/22      PT LONG TERM GOAL #7   Title Patient will present with improved right knee AROM <10 deg knee ext from zero for decreased limp and leg length discrepancy to aide in walking and weight shifting.    Baseline 10/28/2021= 15 deg from zero (right knee ext)    Time 12    Period Weeks    Status New    Target Date 01/20/22      PT LONG TERM GOAL #8   Title Patient will demo modified Ind floor to sit transfer using chair for improved ability to get up off the floor for decreased caregiver or EMS Support.    Baseline 10/28/2021= Not formally assessed yet patient reports difficulty and desire to practice to be able to perform on her own.    Time 12    Period Weeks    Status New    Target Date 01/20/22                   Plan - 11/04/21 1011     Clinical Impression Statement Patient motivated and  participated well within session. She continues to have stiffness in RLE knee and BLE hip which limits neutral position. She tolerated manual stretches and joint mobilizations well with better joint mobility. Patient able to tolerate Leg press/strengthening better requiring less assistance to get on/off machine. She was able to exhibit better neutral position at end of session with less trendelenburg/forward hip. Patient continues to have gait instability with uneven cadence/step length. She requires CGA to min A with standing unsupported for safety. She would benefit from additional skilled PT Intervention to improve strength, ROM, balance and gait safety;    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, B THA x2, diastolic heart failure, back pain, tachycardia    Examination-Activity Limitations Bend;Reach Overhead;Stairs;Transfers;Lift;Squat;Locomotion Level;Carry;Stand;Dressing    Examination-Participation Restrictions Volunteer;Community Activity;Shop;Cleaning;Laundry;Yard Work    Stability/Clinical Decision Making Evolving/Moderate complexity    Rehab Potential Good    PT Frequency 2x / week    PT  Duration 12 weeks    PT Treatment/Interventions ADLs/Self Care Home Management;Aquatic Therapy;Biofeedback;Canalith Repostioning;Cryotherapy;Electrical Stimulation;Moist Heat;Traction;Ultrasound;DME Instruction;Gait training;Functional Therapist, sports;Therapeutic activities;Therapeutic exercise;Balance training;Neuromuscular re-education;Patient/family education;Orthotic Fit/Training;Wheelchair mobility training;Manual techniques;Passive range of motion;Scar mobilization;Dry needling;Energy conservation;Splinting;Taping;Vestibular;Visual/perceptual remediation/compensation;Joint Manipulations    PT Next Visit Plan strengthening, gait, balance, ROM- manual therapy  for Improved ROM RIght knee.    PT Home Exercise Plan Access Code: [email or web address]; 5/1: Access Code: [email or web address]; 10/28/2021=Access Code: [email or web address]  URL: [email or web address]    Consulted and Agree with Plan of Care Patient             Patient will benefit from skilled therapeutic intervention in order to improve the following deficits and impairments:  Abnormal gait, Decreased balance, Decreased endurance, Decreased mobility, Difficulty walking, Hypomobility, Decreased range of motion, Improper body mechanics, Decreased activity tolerance, Decreased coordination, Decreased strength, Postural dysfunction  Visit Diagnosis: Muscle weakness (generalized)  Difficulty in walking, not elsewhere classified  Unsteadiness on feet  Other abnormalities of gait and mobility  Other lack of coordination  Abnormality of gait and mobility     Problem List Patient Active Problem List   Diagnosis Date Noted   Diastolic heart failure (HCC) 07/30/2021   Hyperlipidemia 07/30/2021   Lymphedema 07/30/2021   Tachycardia 06/02/2021   Gait instability 06/02/2021   Primary hypertension 06/02/2021    Rashay Barnette, PT, DPT 11/04/2021, 10:20 AM  [facility] MAIN [facility] SERVICES [address] [place], [place], [address] Phone: [phone]   Fax:  [phone]  Name: Milo Siegle MRN: [id number] Date of Birth: 02-18-1941

## 2021-11-04 ENCOUNTER — Encounter: Payer: Self-pay | Admitting: Physical Therapy

## 2021-11-04 ENCOUNTER — Ambulatory Visit: Payer: Medicare Other | Admitting: Occupational Therapy

## 2021-11-04 ENCOUNTER — Ambulatory Visit: Payer: Medicare Other | Admitting: Physical Therapy

## 2021-11-04 DIAGNOSIS — M6281 Muscle weakness (generalized): Secondary | ICD-10-CM

## 2021-11-04 DIAGNOSIS — R262 Difficulty in walking, not elsewhere classified: Secondary | ICD-10-CM

## 2021-11-04 DIAGNOSIS — R278 Other lack of coordination: Secondary | ICD-10-CM

## 2021-11-04 DIAGNOSIS — R269 Unspecified abnormalities of gait and mobility: Secondary | ICD-10-CM

## 2021-11-04 DIAGNOSIS — R2681 Unsteadiness on feet: Secondary | ICD-10-CM

## 2021-11-04 DIAGNOSIS — R2689 Other abnormalities of gait and mobility: Secondary | ICD-10-CM

## 2021-11-04 NOTE — Therapy (Signed)
[place] MAIN [facility] SERVICES [address] [place], [place], [address] Phone: [phone]   Fax:  [phone]  Physical Therapy Treatment  Patient Details  Name: Mary [name] MRN: [id number] Date of Birth: 05-Apr-1941 Referring Provider (PT): Charlynne Cousins, MD   Encounter Date: 11/04/2021   PT End of Session - 11/04/21 1148     Visit Number 23    Number of Visits 22    Date for PT Re-Evaluation 01/20/22    Authorization Type Medicare Primary, [employer] Secondary    Authorization Time Period 08/04/21-10/27/21; 10/28/2021- 01/20/2022    Progress Note Due on Visit 20    PT Start Time 07-18-16    PT Stop Time 1100    PT Time Calculation (min) 42 min    Equipment Utilized During Treatment Gait belt    Activity Tolerance Patient tolerated treatment well;No increased pain    Behavior During Therapy WFL for tasks assessed/performed             Past Medical History:  Diagnosis Date   Arrhythmia    Back pain    Depression with anxiety    Hip pain    Hyperlipidemia    Hypertension    Neuromuscular disorder ([place])    Paroxysmal tachycardia ([place])     Past Surgical History:  Procedure Laterality Date   ABDOMINAL HYSTERECTOMY     BREAST BIOPSY     BREAST EXCISIONAL BIOPSY Left 2000/07/18   benign   CARDIAC CATHETERIZATION     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   x2   TOTAL HIP ARTHROPLASTY Bilateral    done twice    There were no vitals filed for this visit.   Subjective Assessment - 11/04/21 1026     Subjective Patient reports doing well. She reports feeling a little soreness in her left knee. She reports working on trying to keep foot oriented forward and to avoid hip ER.    Pertinent History Pt is a pleasant 81 y/o female referred to PT for gait instability. Pt ambulates with RW. She reports onset of gait instability started several years ago. 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 Pt has had PT in past following surgeries. Pt reports hx of frequent falls. She has had 2 falls in past 6 months. Pt believes falls are due to difficulty picking up L foot and dragging L foot. Pt reports no injury with falls.  Pt being seen by OT for lymphedema, reports LLE more affected than RLE. Pt currently lives alone. Her spouse passed away recently in 07/18/2022. Her adult son and DIL live nearby and can assist her. Pt denies any dizziness or lightheadedness. Pt reports no pain currently. 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    Limitations Lifting;Standing;Walking;House hold activities    How long can you sit comfortably? not limited    How long can you stand comfortably? Pt reports only limited due to need to stretch lower back    How long can you walk comfortably? At least 30 minutes    Diagnostic tests per chart recent imaging (February) of hip/knee unremarkable.    Patient Stated Goals Pt would like to be able to walk without a cane or at least not have to use her RW.    Currently in Pain? Yes    Pain Score 4  Pain Location Knee    Pain Orientation Left    Pain Descriptors / Indicators Aching;Sore    Pain Type Acute pain;Chronic pain    Pain Onset More than a month ago    Pain Frequency Intermittent    Aggravating Factors  positioning    Pain Relieving Factors rest/stretch    Effect of Pain on Daily Activities decreased activity tolerance;    Multiple Pain Sites No                      TREATMENT: Warm up on Crosstrainer level 2 x4 min, seat #5 (unbilled);   Ex:    Standing on step: -forward toe tap from 1st step to 2nd step against green tband for hip flexor strengthening, challenging gluteal activation in stance leg x15 reps each LE with BUE rail assist Required tactile and verbal cues for proper positioning for optimal muscle activation  Seated hamstring curl: Green tband x15  reps each LE; Does report increased fatigue and soreness in LLE knee;   Standing with red tband around BLE: -hip abduction 2x10  -hip extension 2x10 Patient required min-moderate verbal/tactile cues for correct exercise technique including to avoid hip ER and isolate hip gluteal muscle for strengthening;    Forward backward step over 1/2 bolster single leg with 1 rail assist x10 reps each with increased difficulty stepping over with LLE;   Gait around gym x50 feet with tripod base cane, pt exhibits less trendelenburg gait with short stance on RLE and quick step with LLE due to decreased stance control. She is able to exhibit better reciprocal gait pattern with improved step length;    Reinforced HEP with instruction to work on increasing SLS at home for better stance control and strengthening;    Patient tolerated session well, denies any pain at end of session;                         PT Education - 11/04/21 1148     Education Details exercise technique/positioning;    Person(s) Educated Patient    Methods Explanation;Verbal cues    Comprehension Verbalized understanding;Returned demonstration;Verbal cues required;Need further instruction              PT Short Term Goals - 09/02/21 1449       PT SHORT TERM GOAL #1   Title Pt will be independent with HEP in order to improve strength and balance in order to decrease fall risk and improve function at home and work.    Baseline 3/7: provided pt with initial HEP 4/5; compliant    Time 6    Period Weeks    Status Achieved    Target Date 09/15/21               PT Long Term Goals - 10/28/21 1410       PT LONG TERM GOAL #1   Title Patient will increase FOTO score to equal to or greater than 52 to demonstrate statistically significant improvement in mobility and quality of life.    Baseline 3/7: 50 4/5: 55%; 10/28/2021=53%    Time 12    Period Weeks    Status Achieved    Target Date 10/27/21      PT  LONG TERM GOAL #2   Title Pt will improve BERG by at least 3 points in order to demonstrate clinically significant improvement in balance.    Baseline 3/7: 40/56 4/5: 44/56  Time 12    Period Weeks    Status Achieved    Target Date 10/27/21      PT LONG TERM GOAL #3   Title Pt will decrease 5TSTS by at least 3 seconds in order to demonstrate clinically significant improvement in LE strength.    Baseline 3/7: 15.36 sec with use of BUEs to assist 4/5: 9.6 seconds SUE support    Time 12    Period Weeks    Status Achieved    Target Date 10/27/21      PT LONG TERM GOAL #4   Title Pt will improve ABC by at least 13% in order to demonstrate clinically significant improvement in balance confidence.    Baseline 3/7: 50.63% 4/5: 62% 5/31= 63%    Time 12    Period Weeks    Status Partially Met    Target Date 01/20/22      PT LONG TERM GOAL #5   Title Patient will increase 10 meter walk test to >1.77ms as to improve gait speed for better community ambulation and to reduce fall risk.    Baseline 3/7: 0.75 m/s with 4WW 4/5: 0.96 m/s with 4 WW; 10/28/2021= 0.97 m/s with 4WW    Time 12    Period Weeks    Status Partially Met    Target Date 01/20/22      Additional Long Term Goals   Additional Long Term Goals Yes      PT LONG TERM GOAL #6   Title Patient will demonstrate improved balance by exhibiting ability to single leg stance > 3 sec consistently without UE support for improved dynamic balance.    Baseline 10/28/2021= 1 sec each LE prior to requiring UE support    Time 12    Period Weeks    Status New    Target Date 01/20/22      PT LONG TERM GOAL #7   Title Patient will present with improved right knee AROM <10 deg knee ext from zero for decreased limp and leg length discrepancy to aide in walking and weight shifting.    Baseline 10/28/2021= 15 deg from zero (right knee ext)    Time 12    Period Weeks    Status New    Target Date 01/20/22      PT LONG TERM GOAL #8   Title  Patient will demo modified Ind floor to sit transfer using chair for improved ability to get up off the floor for decreased caregiver or EMS Support.    Baseline 10/28/2021= Not formally assessed yet patient reports difficulty and desire to practice to be able to perform on her own.    Time 12    Period Weeks    Status New    Target Date 01/20/22                   Plan - 11/04/21 1149     Clinical Impression Statement Patient motivated and participated well within session. She was instructed in advanced LE strengthening execise. She does require min VCS for proper exercise technique to target optimal muscle activation. Patient was able to exhibit better hip stability with less trendelenburg with gait. She continues to have some hip compensation with right hip rotated forward in stance with RLE knee flexion. Patient reports mild soreness with some exercise but denies any significant pain. She also reports mild-moderate fatigue at end of session. She would benefit from additional skilled PT Intervention to improve strength, balance and mobility;  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, B THA x2, diastolic heart failure, back pain, tachycardia    Examination-Activity Limitations Bend;Reach Overhead;Stairs;Transfers;Lift;Squat;Locomotion Level;Carry;Stand;Dressing    Examination-Participation Restrictions Volunteer;Community Activity;Shop;Cleaning;Laundry;Yard Work    Merchant navy officer Evolving/Moderate complexity    Rehab Potential Good    PT Frequency 2x / week    PT Duration 12 weeks    PT Treatment/Interventions ADLs/Self Care Home Management;Aquatic Therapy;Biofeedback;Canalith Repostioning;Cryotherapy;Electrical Stimulation;Moist Heat;Traction;Ultrasound;DME Instruction;Gait training;Functional Research scientist (physical sciences);Therapeutic activities;Therapeutic exercise;Balance training;Neuromuscular re-education;Patient/family education;Orthotic Fit/Training;Wheelchair mobility training;Manual techniques;Passive range of motion;Scar mobilization;Dry needling;Energy conservation;Splinting;Taping;Vestibular;Visual/perceptual remediation/compensation;Joint Manipulations    PT Next Visit Plan strengthening, gait, balance, ROM- manual therapy for Improved ROM RIght knee.    PT Home Exercise Plan Access Code: [email or web address]; 5/1: Access Code: [email or web address]; 10/28/2021=Access Code: [email or web address]  URL: [email or web address]    Consulted and Agree with Plan of Care Patient             Patient will benefit from skilled therapeutic intervention in order to improve the following deficits and impairments:  Abnormal gait, Decreased balance, Decreased endurance, Decreased mobility, Difficulty walking, Hypomobility, Decreased range of motion, Improper body mechanics, Decreased activity tolerance, Decreased coordination, Decreased strength, Postural dysfunction  Visit Diagnosis: Muscle weakness (generalized)  Difficulty in walking, not elsewhere classified  Unsteadiness on feet  Other abnormalities of gait and mobility  Other lack of coordination  Abnormality of gait and mobility     Problem List Patient Active Problem List   Diagnosis Date Noted   Diastolic heart failure ([place]) 07/30/2021   Hyperlipidemia 07/30/2021   Lymphedema 07/30/2021   Tachycardia 06/02/2021   Gait instability 06/02/2021   Primary hypertension 06/02/2021    Sherrell Farish, PT, DPT 11/04/2021, 11:52 AM  [place] [address] [place], [place], [address] Phone: [phone]   Fax:  [phone]  Name: Mary [name] MRN: [id number] Date of Birth: 12-12-1940

## 2021-11-05 ENCOUNTER — Ambulatory Visit: Payer: Medicare Other | Admitting: Physical Therapy

## 2021-11-09 ENCOUNTER — Ambulatory Visit: Payer: Medicare Other

## 2021-11-09 ENCOUNTER — Ambulatory Visit: Payer: Medicare Other | Admitting: Occupational Therapy

## 2021-11-09 DIAGNOSIS — I890 Lymphedema, not elsewhere classified: Secondary | ICD-10-CM

## 2021-11-09 DIAGNOSIS — M6281 Muscle weakness (generalized): Secondary | ICD-10-CM | POA: Diagnosis not present

## 2021-11-09 DIAGNOSIS — R2681 Unsteadiness on feet: Secondary | ICD-10-CM

## 2021-11-09 DIAGNOSIS — R278 Other lack of coordination: Secondary | ICD-10-CM

## 2021-11-09 DIAGNOSIS — R262 Difficulty in walking, not elsewhere classified: Secondary | ICD-10-CM

## 2021-11-09 DIAGNOSIS — R2689 Other abnormalities of gait and mobility: Secondary | ICD-10-CM

## 2021-11-09 DIAGNOSIS — R269 Unspecified abnormalities of gait and mobility: Secondary | ICD-10-CM

## 2021-11-09 NOTE — Patient Instructions (Signed)
Lymphedema Self- Care Instructions   1. EXERCISE: Perform lymphatic pumping there ex at least 2 x a day while wearing your compression wraps or garments. Perform 10 reps of each exercise bilaterally and be sure to perform them in order. Don't skip around!  OMIT PARTIAL SIT UPs.  2. MLD: Perform simple self-manual lymphatic drainage (MLD) at least once a day as directed. Take your time! Breathe! ;-)  3. If you have a Flexitouch advanced "pump" use it 1 time each day on a single limb only. The Flexitouch moves lymphatic fluid out of your affected body part and back to your heart, so DO NOT use the Flexi on 2 legs at a time, and DO NOT ues it on 2 legs on the same day. If you experience any atypical shortness of breath, sudden onset of pain, or feelings of heart arhythmia, or racing, discontinue use of the Flexitouch and report these symptoms to your doctor right away. Discontinue Flexi if you have an infection or a fever. It's OK to resume using the device 72 hours AFTER your first dose of oral antibiotic.   4. 4. WRAPS: Compression wraps are to be worn 23 hrs/ 7 days/wk during Intensive Phase of Complete Decongestive Therapy (CDT).Building tolerance may take time and practice, so don't get discouraged. If bandages begin to feel tight during periods of inactivity and/or during the night, try performing your exercises to loosen them. Do not leave short stretch wraps in place for > 23 hours. It is very important that you remove all wraps daily to inspect skin, bathe and perform skin care before reapplying your wraps.  5. Daytime GARMENTS/ HOS DEVICES: During the Self-Management Phase CDT your compression garments are to be worn during waking hours when active. Do NOT sleep in your garments!!  Don daytime garments first thing in the morning. Do not wear your HOS devices all day instead of garments. These will not contain your swelling.  6. PUT YOUR FEET UP! Elevate your feet and legs and feet to the level of  your heart whenever you are sitting down.   7. SKIN: Carefully monitor skin condition and perform impeccable hygiene daily. Bathe skin with mild soap and water and apply low pH lotion (aka Eucerin ) to improve hydration and limit infection risk.

## 2021-11-09 NOTE — Therapy (Signed)
[place] MAIN [facility] SERVICES [address] [place], [place], [address] Phone: [phone]   Fax:  [phone]  Physical Therapy Treatment  Patient Details  Name: Mary [name] MRN: [id number] Date of Birth: 02-07-1941 Referring Provider (PT): Charlynne Cousins, MD   Encounter Date: 11/09/2021   PT End of Session - 11/09/21 1020     Visit Number 24    Number of Visits 73    Date for PT Re-Evaluation 01/20/22    Authorization Type Medicare Primary, AARP Secondary    Authorization Time Period 08/04/21-10/27/21; 10/28/2021- 01/20/2022    Progress Note Due on Visit 20    PT Start Time 1017    PT Stop Time 1059    PT Time Calculation (min) 42 min    Equipment Utilized During Treatment Gait belt    Activity Tolerance Patient tolerated treatment well;No increased pain    Behavior During Therapy WFL for tasks assessed/performed             Past Medical History:  Diagnosis Date   Arrhythmia    Back pain    Depression with anxiety    Hip pain    Hyperlipidemia    Hypertension    Neuromuscular disorder ([place])    Paroxysmal tachycardia ([place])     Past Surgical History:  Procedure Laterality Date   ABDOMINAL HYSTERECTOMY     BREAST BIOPSY     BREAST EXCISIONAL BIOPSY Left 2002   benign   CARDIAC CATHETERIZATION     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   x2   TOTAL HIP ARTHROPLASTY Bilateral    done twice    There were no vitals filed for this visit.   Subjective Assessment - 11/09/21 1017     Subjective Patient reports doing okay today- Went to Dentist this morning so little numb.    Pertinent History Pt is a pleasant 81 y/o female referred to PT for gait instability. Pt ambulates with RW. She reports onset of gait instability started several years ago. 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 Pt has had PT in past following surgeries. Pt reports hx of frequent falls.  She has had 2 falls in past 6 months. Pt believes falls are due to difficulty picking up L foot and dragging L foot. Pt reports no injury with falls.  Pt being seen by OT for lymphedema, reports LLE more affected than RLE. Pt currently lives alone. Her spouse passed away recently in 07-16-22. Her adult son and DIL live nearby and can assist her. Pt denies any dizziness or lightheadedness. Pt reports no pain currently. 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    Limitations Lifting;Standing;Walking;House hold activities    How long can you sit comfortably? not limited    How long can you stand comfortably? Pt reports only limited due to need to stretch lower back    How long can you walk comfortably? At least 30 minutes    Diagnostic tests per chart recent imaging (February) of hip/knee unremarkable.    Patient Stated Goals Pt would like to be able to walk without a cane or at least not have to use her RW.    Currently in Pain? No/denies    Pain Onset --  INTERVENTIONS.     Therapeutic Exercises:   -Step tap with 1 UE support x 20 steps each LE  -Standing slow march with  UE support x 20 reps  -Standing Hip External Rotation x 12 reps alt LE  -Standing donkey kick - BLE x 12 reps  -Standing hip abd BLE x 15 reps  -Side step up onto airex pad x BLE x 12 reps each side.   Seated Hip add squeeze with ball - hold 5 sec x   Seated hamstring curl with BTB x 12 reps BLE    Standing hip hike (standing with 1 LE on edge of aired pad and other on floor with cues to hike LE) x 12 reps each LE   Education provided throughout session via VC/TC and demonstration to facilitate movement at target joints and correct muscle activation for all testing and exercises performed. Rationale for Evaluation and Treatment Rehabilitation                   PT Education - 11/09/21 1019     Education  Details Exercise technique    Person(s) Educated Patient    Methods Explanation;Demonstration;Tactile cues;Verbal cues    Comprehension Verbalized understanding;Returned demonstration;Verbal cues required;Tactile cues required              PT Short Term Goals - 09/02/21 1449       PT SHORT TERM GOAL #1   Title Pt will be independent with HEP in order to improve strength and balance in order to decrease fall risk and improve function at home and work.    Baseline 3/7: provided pt with initial HEP 4/5; compliant    Time 6    Period Weeks    Status Achieved    Target Date 09/15/21               PT Long Term Goals - 10/28/21 1410       PT LONG TERM GOAL #1   Title Patient will increase FOTO score to equal to or greater than 52 to demonstrate statistically significant improvement in mobility and quality of life.    Baseline 3/7: 50 4/5: 55%; 10/28/2021=53%    Time 12    Period Weeks    Status Achieved    Target Date 10/27/21      PT LONG TERM GOAL #2   Title Pt will improve BERG by at least 3 points in order to demonstrate clinically significant improvement in balance.    Baseline 3/7: 40/56 4/5: 44/56    Time 12    Period Weeks    Status Achieved    Target Date 10/27/21      PT LONG TERM GOAL #3   Title Pt will decrease 5TSTS by at least 3 seconds in order to demonstrate clinically significant improvement in LE strength.    Baseline 3/7: 15.36 sec with use of BUEs to assist 4/5: 9.6 seconds SUE support    Time 12    Period Weeks    Status Achieved    Target Date 10/27/21      PT LONG TERM GOAL #4   Title Pt will improve ABC by at least 13% in order to demonstrate clinically significant improvement in balance confidence.    Baseline 3/7: 50.63% 4/5: 62% 5/31= 63%    Time 12    Period Weeks    Status Partially Met    Target Date 01/20/22      PT LONG TERM GOAL #5  Title Patient will increase 10 meter walk test to >1.5ms as to improve gait speed for better  community ambulation and to reduce fall risk.    Baseline 3/7: 0.75 m/s with 4WW 4/5: 0.96 m/s with 4 WW; 10/28/2021= 0.97 m/s with 4WW    Time 12    Period Weeks    Status Partially Met    Target Date 01/20/22      Additional Long Term Goals   Additional Long Term Goals Yes      PT LONG TERM GOAL #6   Title Patient will demonstrate improved balance by exhibiting ability to single leg stance > 3 sec consistently without UE support for improved dynamic balance.    Baseline 10/28/2021= 1 sec each LE prior to requiring UE support    Time 12    Period Weeks    Status New    Target Date 01/20/22      PT LONG TERM GOAL #7   Title Patient will present with improved right knee AROM <10 deg knee ext from zero for decreased limp and leg length discrepancy to aide in walking and weight shifting.    Baseline 10/28/2021= 15 deg from zero (right knee ext)    Time 12    Period Weeks    Status New    Target Date 01/20/22      PT LONG TERM GOAL #8   Title Patient will demo modified Ind floor to sit transfer using chair for improved ability to get up off the floor for decreased caregiver or EMS Support.    Baseline 10/28/2021= Not formally assessed yet patient reports difficulty and desire to practice to be able to perform on her own.    Time 12    Period Weeks    Status New    Target Date 01/20/22                   Plan - 11/09/21 1021     Clinical Impression Statement Patient performed well overall today with focus on hip strengthening. She was able to follow VC well and return demo of prescribed exercises today. She exhibited increased right LE weakness with difficulty step tapping and marching without Increased UE Support. She would benefit from additional skilled PT Intervention to improve strength, balance and mobility.    Personal Factors and Comorbidities Age;Past/Current Experience;Time since onset of injury/illness/exacerbation;Fitness;Comorbidity 3+    Comorbidities HTN, BTKA x1  R and 2xL, B THA x2, diastolic heart failure, back pain, tachycardia    Examination-Activity Limitations Bend;Reach Overhead;Stairs;Transfers;Lift;Squat;Locomotion Level;Carry;Stand;Dressing    Examination-Participation Restrictions Volunteer;Community Activity;Shop;Cleaning;Laundry;Yard Work    SMerchant navy officerEvolving/Moderate complexity    Rehab Potential Good    PT Frequency 2x / week    PT Duration 12 weeks    PT Treatment/Interventions ADLs/Self Care Home Management;Aquatic Therapy;Biofeedback;Canalith Repostioning;Cryotherapy;Electrical Stimulation;Moist Heat;Traction;Ultrasound;DME Instruction;Gait training;Functional mBiochemist, clinicalTherapeutic activities;Therapeutic exercise;Balance training;Neuromuscular re-education;Patient/family education;Orthotic Fit/Training;Wheelchair mobility training;Manual techniques;Passive range of motion;Scar mobilization;Dry needling;Energy conservation;Splinting;Taping;Vestibular;Visual/perceptual remediation/compensation;Joint Manipulations    PT Next Visit Plan strengthening, gait, balance, ROM- manual therapy for Improved ROM RIght knee.    PT Home Exercise Plan Access Code: [email or web address] 5/1: Access Code: [email or web address] 10/28/2021=Access Code: [email or web address] URL: [email or web address]    Consulted and Agree with Plan of Care Patient             Patient will benefit from skilled therapeutic intervention in order to improve the following deficits and impairments:  Abnormal gait, Decreased balance, Decreased endurance, Decreased mobility, Difficulty walking, Hypomobility, Decreased range  of motion, Improper body mechanics, Decreased activity tolerance, Decreased coordination, Decreased strength, Postural dysfunction  Visit Diagnosis: Muscle weakness (generalized)  Difficulty in walking, not elsewhere classified  Unsteadiness on feet  Other abnormalities of gait and mobility  Other lack of  coordination  Abnormality of gait and mobility     Problem List Patient Active Problem List   Diagnosis Date Noted   Diastolic heart failure ([place]) 07/30/2021   Hyperlipidemia 07/30/2021   Lymphedema 07/30/2021   Tachycardia 06/02/2021   Gait instability 06/02/2021   Primary hypertension 06/02/2021    Lewis Moccasin, PT 11/09/2021, 11:23 AM  [place] MAIN [facility] SERVICES [place], [place], [address] Phone: [phone]   Fax:  [phone]  Name: Nakeshia Waldeck MRN: [id number] Date of Birth: [birth year]

## 2021-11-09 NOTE — Therapy (Signed)
[place] MAIN [facility] SERVICES [address] [place], [place], [address] Phone: [phone]   Fax:  [phone]  Occupational Therapy Treatment  Patient Details  Name: Mary [name] MRN: [id number] Date of Birth: [birth year] Referring Provider (OT): Pershing Cox, MD   Encounter Date: 11/09/2021   OT End of Session - 11/09/21 1257     Visit Number 21    Number of Visits 36    Date for OT Re-Evaluation 01/27/22    OT Start Time 85    OT Stop Time 1215    OT Time Calculation (min) 65 min             Past Medical History:  Diagnosis Date   Arrhythmia    Back pain    Depression with anxiety    Hip pain    Hyperlipidemia    Hypertension    Neuromuscular disorder ([place])    Paroxysmal tachycardia ([place])     Past Surgical History:  Procedure Laterality Date   ABDOMINAL HYSTERECTOMY     BREAST BIOPSY     BREAST EXCISIONAL BIOPSY Left 2002   benign   CARDIAC CATHETERIZATION     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   x2   TOTAL HIP ARTHROPLASTY Bilateral    done twice    There were no vitals filed for this visit.   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 Manufacturer's rep from  [employer], Mary [name], is here today to assist w/ a trial of the advanced Flexitouch sequential pneumatic compression device (V5643) medically necesary for optimal LE self-management over time at home. Pt reports she has used her basic  Lymphapress device  ( [id number])  on both legs at a time, 3-4 x weekly sinmce she obtained it around 2019.Marland Kitchen    Pertinent History HTN, B TKA x 2, B THA x2, Diastolic heart failure, back pain, tachycardia    Limitations difficulty walking, decreased standing tolerance, impaired transfers and functional mobility, impaired LB dressing, Limited L hip A/PROM, impaired dynamic balance, difficulty w/ LB dressing-fitting shoes, socks, pants     Repetition Increases Symptoms    Special Tests Intake FOTO score = 55/100; + [place]    Patient Stated Goals get leg swelling under control; I want to be able to get out and walk    Currently in Pain? Yes    Pain Score --   not rated numerically   Pain Location Leg    Pain Orientation Right;Left    Pain Descriptors / Indicators Heaviness;Tightness;Tender;Discomfort;Tiring    Pain Type Chronic pain    Pain Onset Other (comment)                          OT Treatments/Exercises (OP) - 11/09/21 1423       ADLs   ADL Education Given Yes      Manual Therapy   Manual Therapy Edema management    Edema Management Trial of advanced sequential pneumatic compression device ([id number]) , or "pump" for 1 hr  utilizing LLE and LLQ garments with 30-40 m mmHg    Compression Bandaging no compression after session. Pt will apppy loaned wraps when she arrives home                    OT Education - 11/09/21 1305     Education Details Pt/ family  education for basic and advanced sequential pneumatic compression devices, or "pump", including clinical indications, how thy work, how the basic pneumatic "pump" differs from the advanced Flexitouch, indications, contraindications and precautions and care and use routines for pneumatic compression devices. Patient's questions were answered throughout trial and handouts and Internet resources given for reference.    Person(s) Educated Patient    Methods Explanation;Demonstration;Handout    Comprehension Verbalized understanding;Returned demonstration;Need further instruction                 OT Long Term Goals - 10/28/21 1526       OT LONG TERM GOAL #1   Title Given this patient's low Intake score of 55/100 on the functional outcomes FOTO tool, patient will experience an increase in function of 2 points to improve basic and instrumental ADLs performance, including lymphedema self-care.    Baseline 55/100    Time 12    Period  Weeks    Status Not Met   Intake FOTO score: 55/100%. Final FOTO 5/51/23 = 52/100%. 3 point decrease overall. Goal not met.   Target Date --      OT LONG TERM GOAL #2   Title Pt will demonstrate understanding of lymphedema prevention strategies by identifying and discussing 5 precautions using printed reference (modified assistance) to reduce risk of progression and to limit infection risk.    Baseline Max A    Time 4    Period Days    Status Achieved    Target Date --   4th OT visit     OT LONG TERM GOAL #3   Title Pt will be able to apply knee length, multi-layer, short stretch compression wraps to one limb at a time using gradient techniques with MODIFIED INDEPENDENCE (extra time) to decrease limb volume, to limit infection risk, and to limit lymphedema progression. 08/31/21: Goal revised:   "With Maximum CG assistance"    Baseline Max A    Time 4    Period Days    Status Not Met   Patient's family member has learned to apply wraps using gradient techniques, however, limb is not consistently wrapped on a daily basis as is recommended for optimal volume reduction. 10/28/21: Pt never achieved compression wrap awpplication goal.   Target Date --   4th OT visit     OT LONG TERM GOAL #4   Title Pt will achieve at least a 10% limb volume reduction below the knee bilaterally to return limb to more normal size and shape, to limit infection risk, to decrease pain, to improve function, and to limit lymphedema progression.    Baseline Dependent    Time 12    Period Weeks    Status Not Met   LLE overall INCREASED by 9.23% in volume below the knee. GOAL not met due to inconsistent compression. No CG support as recommended.   Target Date 10/29/21      OT LONG TERM GOAL #5   Title Pt will achieve and sustain a least 85% compliance with all LE self-care home program components throughout Intensive Phase CDT, including daily skin inspection and care, lymphatic pumping ther ex, 23/7 compression wraps and  simple self-MLD, to sustain clinical gains made in CDT and to limit lymphedema progression and further functional decline.    Baseline D    Time 12    Period Weeks    Status Not Met   minimal compliance w LE self care home program between sessions   Target Date 10/29/21  OT LONG TERM GOAL #6   Title Pt will be able to perform all lymphedema self-care home program components using correct techniques with extra time and assistive devices PRN (modified independence), including simple self MLD, lymphatic pumping exercise, don and doff appropriate compression garments/ devices with Max A, and perform daily skin care regime to limit lymphedema progression and infection risk.    Baseline D    Time 12    Period Weeks    Status Not Met    Target Date 10/29/21                   Plan - 11/09/21 1438     Clinical Impression Statement Pt has used a basic sequential pneumatic compression "pump" (Z7096), the Lymphapress, 3-4 x weekly for the last 4 years. The basic device has failed to control chronic lipo-lymphedema, including progressive swelling of lower limbs, buttocks and hips,  and to limit lipo-lymphedema progression. Mary [name] commenced Occupational Therapy for conservative Complete Decongestive Therapy on 07/31/21 and has undergone CDT 2 x weekly for more than 10 weeks. Treatment includes manual lymphatic drainage (MLD), skin care, therapeutic exercise and multilayer compression bandaging, as well as extensive self-care education and BLE elevation. The Flexitouch pneumatic compression device is medically necessary for optimal long term LE self-management to limit progression and to limit further functional decline.    OT Occupational Profile and History Detailed Assessment- Review of Records and additional review of physical, cognitive, psychosocial history related to current functional performance    Occupational performance deficits (Please refer to evaluation for details):  ADL's;IADL's;Rest and Sleep;Leisure;Social Participation;Work    Marketing executive / Function / Physical Skills ADL;ROM;IADL;Edema;Balance;Skin integrity;Mobility;Flexibility;Decreased knowledge of precautions;Decreased knowledge of use of DME    Rehab Potential Good    Clinical Decision Making Several treatment options, min-mod task modification necessary    Comorbidities Affecting Occupational Performance: Presence of comorbidities impacting occupational performance    Modification or Assistance to Complete Evaluation  Min-Moderate modification of tasks or assist with assess necessary to complete eval    OT Frequency 2x / week    OT Duration 12 weeks    OT Treatment/Interventions Self-care/ADL training;DME and/or AE instruction;Manual lymph drainage;Compression bandaging;Therapeutic activities;Coping strategies training;Therapeutic exercise;Patient/family education;Manual Therapy    Consulted and Agree with Plan of Care Patient             Patient will benefit from skilled therapeutic intervention in order to improve the following deficits and impairments:   Body Structure / Function / Physical Skills: ADL, ROM, IADL, Edema, Balance, Skin integrity, Mobility, Flexibility, Decreased knowledge of precautions, Decreased knowledge of use of DME       Visit Diagnosis: Lymphedema, not elsewhere classified    Problem List Patient Active Problem List   Diagnosis Date Noted   Diastolic heart failure ([place]) 07/30/2021   Hyperlipidemia 07/30/2021   Lymphedema 07/30/2021   Tachycardia 06/02/2021   Gait instability 06/02/2021   Primary hypertension 06/02/2021    Andrey Spearman, MS, OTR/L, CLT-LANA 11/09/21 2:57 PM   [place] MAIN [facility] SERVICES [place], [place], [address] Phone: [phone]   Fax:  [phone]  Name: Mary [name] MRN: [id number] Date of Birth: [birth year]

## 2021-11-11 ENCOUNTER — Ambulatory Visit: Payer: Medicare Other | Admitting: Physical Therapy

## 2021-11-12 ENCOUNTER — Encounter: Payer: Self-pay | Admitting: Physical Therapy

## 2021-11-12 ENCOUNTER — Encounter: Payer: Medicare Other | Admitting: Occupational Therapy

## 2021-11-12 ENCOUNTER — Ambulatory Visit: Payer: Medicare Other | Admitting: Physical Therapy

## 2021-11-12 DIAGNOSIS — R2689 Other abnormalities of gait and mobility: Secondary | ICD-10-CM

## 2021-11-12 DIAGNOSIS — M6281 Muscle weakness (generalized): Secondary | ICD-10-CM | POA: Diagnosis not present

## 2021-11-12 DIAGNOSIS — R269 Unspecified abnormalities of gait and mobility: Secondary | ICD-10-CM

## 2021-11-12 DIAGNOSIS — R2681 Unsteadiness on feet: Secondary | ICD-10-CM

## 2021-11-12 DIAGNOSIS — R262 Difficulty in walking, not elsewhere classified: Secondary | ICD-10-CM

## 2021-11-12 NOTE — Therapy (Signed)
[place] MAIN [facility] SERVICES [address] [place], [place], [address] Phone: [phone]   Fax:  [phone]  Physical Therapy Treatment  Patient Details  Name: Mary [name] MRN: [id number] Date of Birth: 1940-06-21 Referring Provider (PT): Charlynne Cousins, MD   Encounter Date: 11/12/2021   PT End of Session - 11/12/21 1330     Visit Number 25    Number of Visits 44    Date for PT Re-Evaluation 01/20/22    Authorization Type Medicare Primary, A[employer] Secondary    Authorization Time Period 08/04/21-10/27/21; 10/28/2021- 01/20/2022    Progress Note Due on Visit 20    PT Start Time 1149    PT Stop Time 1231    PT Time Calculation (min) 42 min    Equipment Utilized During Treatment Gait belt    Activity Tolerance Patient tolerated treatment well;No increased pain    Behavior During Therapy WFL for tasks assessed/performed             Past Medical History:  Diagnosis Date   Arrhythmia    Back pain    Depression with anxiety    Hip pain    Hyperlipidemia    Hypertension    Neuromuscular disorder ([place])    Paroxysmal tachycardia ([place])     Past Surgical History:  Procedure Laterality Date   ABDOMINAL HYSTERECTOMY     BREAST BIOPSY     BREAST EXCISIONAL BIOPSY Left 2002   benign   CARDIAC CATHETERIZATION     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   x2   TOTAL HIP ARTHROPLASTY Bilateral    done twice    There were no vitals filed for this visit.   Subjective Assessment - 11/12/21 1154     Subjective Pt reports she went to a water aerobics class and enjoyed it. She reports she would like to continue with these in her pool.    Pertinent History Pt is a pleasant 81 y/o female referred to PT for gait instability. Pt ambulates with RW. She reports onset of gait instability started several years ago. 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 Pt has had PT in past  following surgeries. Pt reports hx of frequent falls. She has had 2 falls in past 6 months. Pt believes falls are due to difficulty picking up L foot and dragging L foot. Pt reports no injury with falls.  Pt being seen by OT for lymphedema, reports LLE more affected than RLE. Pt currently lives alone. Her spouse passed away recently in 20-Jun-2022. Her adult son and DIL live nearby and can assist her. Pt denies any dizziness or lightheadedness. Pt reports no pain currently. 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    Limitations Lifting;Standing;Walking;House hold activities    How long can you sit comfortably? not limited    How long can you stand comfortably? Pt reports only limited due to need to stretch lower back    How long can you walk comfortably? At least 30 minutes    Diagnostic tests per chart recent imaging (February) of hip/knee unremarkable.    Patient Stated Goals Pt would like to be able to walk without a cane or at least not have to use her RW.                INTERVENTIONS.  Therapeutic Exercises:   -Step tap with 1 UE support x 20 steps each LE  -Standing slow march with  UE support x 20 reps total  -cues for slow and controlled motion.   -Standing Hip External Rotation x 12 reps alt LE  -Standing donkey kick - BLE x 12 reps  -Standing hip abd BLE x 15 reps -cues for R side to prevent excessive UE support and upper trap activation.   Seated Hip add squeeze with ball - hold 5 sec x 15  Seated hamstring curl with BTB x 12 reps BLE  -X10 on each lower extremity in standing position in order to ensure patient had to complete similar activities at home.  NMR -  Side step up onto/off airex pad x BLE x 8 reps each side.   Standing on Airex with self-selected stance and having patient play white board games x6 minutes -No loss of balance noted and good endurance shown in ankle  musculature.    Education provided throughout session via VC/TC and demonstration to facilitate movement at target joints and correct muscle activation for all testing and exercises performed. Rationale for Evaluation and Treatment Rehabilitation                                 PT Short Term Goals - 09/02/21 1449       PT SHORT TERM GOAL #1   Title Pt will be independent with HEP in order to improve strength and balance in order to decrease fall risk and improve function at home and work.    Baseline 3/7: provided pt with initial HEP 4/5; compliant    Time 6    Period Weeks    Status Achieved    Target Date 09/15/21               PT Long Term Goals - 10/28/21 1410       PT LONG TERM GOAL #1   Title Patient will increase FOTO score to equal to or greater than 52 to demonstrate statistically significant improvement in mobility and quality of life.    Baseline 3/7: 50 4/5: 55%; 10/28/2021=53%    Time 12    Period Weeks    Status Achieved    Target Date 10/27/21      PT LONG TERM GOAL #2   Title Pt will improve BERG by at least 3 points in order to demonstrate clinically significant improvement in balance.    Baseline 3/7: 40/56 4/5: 44/56    Time 12    Period Weeks    Status Achieved    Target Date 10/27/21      PT LONG TERM GOAL #3   Title Pt will decrease 5TSTS by at least 3 seconds in order to demonstrate clinically significant improvement in LE strength.    Baseline 3/7: 15.36 sec with use of BUEs to assist 4/5: 9.6 seconds SUE support    Time 12    Period Weeks    Status Achieved    Target Date 10/27/21      PT LONG TERM GOAL #4   Title Pt will improve ABC by at least 13% in order to demonstrate clinically significant improvement in balance confidence.    Baseline 3/7: 50.63% 4/5: 62% 5/31= 63%    Time 12    Period Weeks    Status Partially Met    Target Date 01/20/22      PT  LONG TERM GOAL #5   Title Patient will increase 10  meter walk test to >1.37ms as to improve gait speed for better community ambulation and to reduce fall risk.    Baseline 3/7: 0.75 m/s with 4WW 4/5: 0.96 m/s with 4 WW; 10/28/2021= 0.97 m/s with 4WW    Time 12    Period Weeks    Status Partially Met    Target Date 01/20/22      Additional Long Term Goals   Additional Long Term Goals Yes      PT LONG TERM GOAL #6   Title Patient will demonstrate improved balance by exhibiting ability to single leg stance > 3 sec consistently without UE support for improved dynamic balance.    Baseline 10/28/2021= 1 sec each LE prior to requiring UE support    Time 12    Period Weeks    Status New    Target Date 01/20/22      PT LONG TERM GOAL #7   Title Patient will present with improved right knee AROM <10 deg knee ext from zero for decreased limp and leg length discrepancy to aide in walking and weight shifting.    Baseline 10/28/2021= 15 deg from zero (right knee ext)    Time 12    Period Weeks    Status New    Target Date 01/20/22      PT LONG TERM GOAL #8   Title Patient will demo modified Ind floor to sit transfer using chair for improved ability to get up off the floor for decreased caregiver or EMS Support.    Baseline 10/28/2021= Not formally assessed yet patient reports difficulty and desire to practice to be able to perform on her own.    Time 12    Period Weeks    Status New    Target Date 01/20/22                   Plan - 11/12/21 1332     Clinical Impression Statement Patient is pleasant and presents with excellent motivation for completion of physical therapy activities.  Patient did have some discomfort in the right upper extremity with upper extremity support this session several exercises were monitored for upper extremity pain throughout.  Patient responded well to laxation techniques for upper traps which improve up upper extremity pain and decrease amount of upper extremity support patient was providing.  Patient  progressed with several balance activities with good tolerance.  Patient will continue to benefit from skilled physical therapy to improve her strength, balance, and mobility.    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, B THA x2, diastolic heart failure, back pain, tachycardia    Examination-Activity Limitations Bend;Reach Overhead;Stairs;Transfers;Lift;Squat;Locomotion Level;Carry;Stand;Dressing    Examination-Participation Restrictions Volunteer;Community Activity;Shop;Cleaning;Laundry;Yard Work    SMerchant navy officerEvolving/Moderate complexity    Rehab Potential Good    PT Frequency 2x / week    PT Duration 12 weeks    PT Treatment/Interventions ADLs/Self Care Home Management;Aquatic Therapy;Biofeedback;Canalith Repostioning;Cryotherapy;Electrical Stimulation;Moist Heat;Traction;Ultrasound;DME Instruction;Gait training;Functional mBiochemist, clinicalTherapeutic activities;Therapeutic exercise;Balance training;Neuromuscular re-education;Patient/family education;Orthotic Fit/Training;Wheelchair mobility training;Manual techniques;Passive range of motion;Scar mobilization;Dry needling;Energy conservation;Splinting;Taping;Vestibular;Visual/perceptual remediation/compensation;Joint Manipulations    PT Next Visit Plan strengthening, gait, balance, ROM- manual therapy for Improved ROM RIght knee.    PT Home Exercise Plan Access Code: [email or web address] 5/1: Access Code: [email or web address] 10/28/2021=Access Code: [email or web address] URL: [email or web address]    Consulted and Agree with Plan  of Care Patient             Patient will benefit from skilled therapeutic intervention in order to improve the following deficits and impairments:  Abnormal gait, Decreased balance, Decreased endurance, Decreased mobility, Difficulty walking, Hypomobility, Decreased  range of motion, Improper body mechanics, Decreased activity tolerance, Decreased coordination, Decreased strength, Postural dysfunction  Visit Diagnosis: Difficulty in walking, not elsewhere classified  Unsteadiness on feet  Other abnormalities of gait and mobility  Abnormality of gait and mobility     Problem List Patient Active Problem List   Diagnosis Date Noted   Diastolic heart failure ([place]) 07/30/2021   Hyperlipidemia 07/30/2021   Lymphedema 07/30/2021   Tachycardia 06/02/2021   Gait instability 06/02/2021   Primary hypertension 06/02/2021    Particia Lather, PT 11/12/2021, 1:33 PM  [place] MAIN [facility] SERVICES [address] [place], [place], [address] Phone: [phone]   Fax:  [phone]  Name: Mary [name] MRN: [id number] Date of Birth: [birth year]

## 2021-11-16 ENCOUNTER — Other Ambulatory Visit: Payer: Self-pay | Admitting: Internal Medicine

## 2021-11-16 DIAGNOSIS — R921 Mammographic calcification found on diagnostic imaging of breast: Secondary | ICD-10-CM

## 2021-11-17 ENCOUNTER — Ambulatory Visit: Payer: Medicare Other

## 2021-11-17 NOTE — Therapy (Addendum)
[place] MAIN [facility] SERVICES [address] [place], [place], [address] Phone: [phone]   Fax:  [phone]  Occupational Therapy Treatment  Patient Details  Name: Mary [name] MRN: [id number] Date of Birth: 09/01/1940 Referring Provider (OT): Pershing Cox, MD   Encounter Date: 11/09/2021   OT End of Session - 11/17/21 0856     Visit Number 21   Number of Visits 36    Date for OT Re-Evaluation 01/27/22    OT Start Time 1110    OT Stop Time 1215    OT Time Calculation (min) 65 min    Equipment Utilized During Treatment [employer] Flexitouch    Activity Tolerance Patient tolerated treatment well;Patient limited by pain;No increased pain    Behavior During Therapy WFL for tasks assessed/performed             Past Medical History:  Diagnosis Date   Arrhythmia    Back pain    Depression with anxiety    Hip pain    Hyperlipidemia    Hypertension    Neuromuscular disorder ([place])    Paroxysmal tachycardia ([place])     Past Surgical History:  Procedure Laterality Date   ABDOMINAL HYSTERECTOMY     BREAST BIOPSY     BREAST EXCISIONAL BIOPSY Left 2002   benign   CARDIAC CATHETERIZATION     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   x2   TOTAL HIP ARTHROPLASTY Bilateral    done twice    There were no vitals filed for this visit.   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 Manufacturer's rep from  [employer], Luan Pulling, is here today to assist w/ a trial of the advanced Flexitouch sequential pneumatic compression device (T2481) medically necesary for optimal LE self-management over time at home. Pt reports she has used her basic  Lymphapress device  ( [id number]), the Lymphapress PCD-52 at 30 mmHg , on both legs at a time, 3-4 x weekly since she obtained it in 2019.Marland Kitchen    Pertinent History HTN, B TKA x 2, B THA x2, Diastolic heart failure,  back pain, tachycardia    Limitations difficulty walking, decreased standing tolerance, impaired transfers and functional mobility, impaired LB dressing, Limited L hip A/PROM, impaired dynamic balance, difficulty w/ LB dressing-fitting shoes, socks, pants    Repetition Increases Symptoms    Special Tests Intake FOTO score = 55/100; + Stemmer    Patient Stated Goals get leg swelling under control; I want to be able to get out and walk    Pain Onset Other (comment)                          OT Treatments/Exercises (OP) - 12/21/21 1352       ADLs   ADL Education Given Yes      Manual Therapy   Manual Therapy Edema management;Other (comment)   Hip circumference measures 125 cm.   Edema Management Trial of advanced sequential pneumatic compression device (J1216) , or "pump" for 1 hr  utilizing LLE and LLQ garments with 30-40 m mmHg    Compression Bandaging no compression after session. Pt will apppy loaned wraps when she arrives home                    OT Education - 12/21/21 1353     Education Details  Pt/ family education for basic and advanced sequential pneumatic compression devices, or "pump", including clinical indications, how thy work, how the basic pneumatic "pump" differs from the advanced Flexitouch, indications, contraindications and precautions and care and use routines for pneumatic compression devices. Patient's questions were answered throughout trial and handouts and Internet resources given for reference.    Person(s) Educated Patient    Methods Explanation;Demonstration;Handout    Comprehension Verbalized understanding;Returned demonstration;Need further instruction                 OT Long Term Goals - 12/21/21 1351       OT LONG TERM GOAL #1   Title Given this patient's low Intake score of 55/100 on the functional outcomes FOTO tool, patient will experience an increase in function of 2 points to improve basic and instrumental ADLs  performance, including lymphedema self-care.    Baseline 55/100    Time 12    Period Weeks    Status Not Met   Intake FOTO score: 55/100%. Final FOTO 5/51/23 = 52/100%. 3 point decrease overall. Goal not met.     OT LONG TERM GOAL #2   Title Pt will demonstrate understanding of lymphedema prevention strategies by identifying and discussing 5 precautions using printed reference (modified assistance) to reduce risk of progression and to limit infection risk.    Baseline Max A    Time 4    Period Days    Status Achieved    Target Date --   4th OT visit     OT LONG TERM GOAL #3   Title Pt will be able to apply knee length, multi-layer, short stretch compression wraps to one limb at a time using gradient techniques with MODIFIED INDEPENDENCE (extra time) to decrease limb volume, to limit infection risk, and to limit lymphedema progression. 08/31/21: Goal revised:   "With Maximum CG assistance"    Baseline Max A    Time 4    Period Days    Status Not Met   Patient's family member has learned to apply wraps using gradient techniques, however, limb is not consistently wrapped on a daily basis as is recommended for optimal volume reduction. 10/28/21: Pt never achieved compression wrap awpplication goal.   Target Date --   4th OT visit     OT LONG TERM GOAL #4   Title Pt will achieve at least a 10% limb volume reduction below the knee bilaterally to return limb to more normal size and shape, to limit infection risk, to decrease pain, to improve function, and to limit lymphedema progression.    Baseline Dependent    Time 12    Period Weeks    Status Not Met   LLE overall INCREASED by 9.23% in volume below the knee. GOAL not met due to inconsistent compression. No CG support as recommended.   Target Date 10/29/21      OT LONG TERM GOAL #5   Title Pt will achieve and sustain a least 85% compliance with all LE self-care home program components throughout Intensive Phase CDT, including daily skin  inspection and care, lymphatic pumping ther ex, 23/7 compression wraps and simple self-MLD, to sustain clinical gains made in CDT and to limit lymphedema progression and further functional decline.    Baseline D    Time 12    Period Weeks    Status Not Met   minimal compliance w LE self care home program between sessions   Target Date 10/29/21  OT LONG TERM GOAL #6   Title Pt will be able to perform all lymphedema self-care home program components using correct techniques with extra time and assistive devices PRN (modified independence), including simple self MLD, lymphatic pumping exercise, don and doff appropriate compression garments/ devices with Max A, and perform daily skin care regime to limit lymphedema progression and infection risk.    Baseline D    Time 12    Period Weeks    Status Not Met    Target Date 10/29/21                   Plan - 11/25/21 1058     Clinical Impression Statement Pt has used a basic Lym,phapress PCD-51 731A  sequential pneumatic compression "pump" (E7209), 3-4 x weekly since 2019. The basic device has failed to control chronic lipo-lymphedema, including progressive swelling of lower limbs, buttocks and hips,  and lipo-lymphedema has progressed. Mary [name] commenced Occupational Therapy for conservative Complete Decongestive Therapy on 07/31/21 and has undergone CDT 2 x weekly for more than 10 weeks. Treatment includes manual lymphatic drainage (MLD), skin care, therapeutic exercise and multilayer compression bandaging, as well as extensive self-care education and BLE elevation. Pt has limited hip A/PROM and is unable to reach distal legs and feet to perform simple self MLD. Hip circumference measures 125 cm today. The Flexitouch pneumatic compression device is medically necessary for her to sustain optimal long term LE self-management to limit progression and to limit further functional decline.    OT Occupational Profile and History Detailed  Assessment- Review of Records and additional review of physical, cognitive, psychosocial history related to current functional performance    Occupational performance deficits (Please refer to evaluation for details): ADL's;IADL's;Rest and Sleep;Leisure;Social Participation;Work    Marketing executive / Function / Physical Skills ADL;ROM;IADL;Edema;Balance;Skin integrity;Mobility;Flexibility;Decreased knowledge of precautions;Decreased knowledge of use of DME    Rehab Potential Good    Clinical Decision Making Several treatment options, min-mod task modification necessary    Comorbidities Affecting Occupational Performance: Presence of comorbidities impacting occupational performance    Modification or Assistance to Complete Evaluation  Min-Moderate modification of tasks or assist with assess necessary to complete eval    OT Frequency 2x / week    OT Duration 12 weeks    OT Treatment/Interventions Self-care/ADL training;DME and/or AE instruction;Manual lymph drainage;Compression bandaging;Therapeutic activities;Coping strategies training;Therapeutic exercise;Patient/family education;Manual Therapy    Consulted and Agree with Plan of Care Patient             Patient will benefit from skilled therapeutic intervention in order to improve the following deficits and impairments:   Body Structure / Function / Physical Skills: ADL, ROM, IADL, Edema, Balance, Skin integrity, Mobility, Flexibility, Decreased knowledge of precautions, Decreased knowledge of use of DME       Visit Diagnosis: Lymphedema, not elsewhere classified    Problem List Patient Active Problem List   Diagnosis Date Noted   Insomnia 12/16/2021   Depression, recurrent ([place]) 47/01/6282   Diastolic heart failure ([place]) 07/30/2021   Hyperlipidemia 07/30/2021   Lymphedema 07/30/2021   Gait instability 06/02/2021   Primary hypertension 06/02/2021    Andrey Spearman, MS, OTR/L, CLT-LANA 12/21/21 1:54 PM  [place] MAIN [facility] SERVICES [place], [place], [address] Phone: [phone]   Fax:  [phone]  Name: Mary [name] MRN: [id number] Date of Birth: Oct 17, 1940

## 2021-11-19 ENCOUNTER — Ambulatory Visit: Payer: Medicare Other | Admitting: Physical Therapy

## 2021-11-19 ENCOUNTER — Encounter: Payer: Self-pay | Admitting: Physical Therapy

## 2021-11-19 ENCOUNTER — Ambulatory Visit: Payer: Medicare Other | Admitting: Occupational Therapy

## 2021-11-19 DIAGNOSIS — R278 Other lack of coordination: Secondary | ICD-10-CM

## 2021-11-19 DIAGNOSIS — M6281 Muscle weakness (generalized): Secondary | ICD-10-CM

## 2021-11-19 DIAGNOSIS — R2689 Other abnormalities of gait and mobility: Secondary | ICD-10-CM

## 2021-11-19 DIAGNOSIS — R262 Difficulty in walking, not elsewhere classified: Secondary | ICD-10-CM

## 2021-11-19 DIAGNOSIS — I890 Lymphedema, not elsewhere classified: Secondary | ICD-10-CM

## 2021-11-19 DIAGNOSIS — R2681 Unsteadiness on feet: Secondary | ICD-10-CM

## 2021-11-19 DIAGNOSIS — R269 Unspecified abnormalities of gait and mobility: Secondary | ICD-10-CM

## 2021-11-19 NOTE — Therapy (Signed)
[place] MAIN [facility] SERVICES [address] [place], [place], [address] Phone: [phone]   Fax:  [phone]  Occupational Therapy Treatment  Patient Details  Name: Mary [name] MRN: [id number] Date of Birth: 26-Nov-1940 Referring Provider (OT): Pershing Cox, MD   Encounter Date: 11/19/2021   OT End of Session - 11/19/21 1611     Visit Number 23    Number of Visits 36    Date for OT Re-Evaluation 01/27/22    OT Start Time 0300    OT Stop Time 0405    OT Time Calculation (min) 65 min    Equipment Utilized During Treatment [employer] Flexitouch    Activity Tolerance Patient tolerated treatment well;Patient limited by pain;No increased pain    Behavior During Therapy WFL for tasks assessed/performed             Past Medical History:  Diagnosis Date   Arrhythmia    Back pain    Depression with anxiety    Hip pain    Hyperlipidemia    Hypertension    Neuromuscular disorder ([place])    Paroxysmal tachycardia ([place])     Past Surgical History:  Procedure Laterality Date   ABDOMINAL HYSTERECTOMY     BREAST BIOPSY     BREAST EXCISIONAL BIOPSY Left 2002   benign   CARDIAC CATHETERIZATION     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   x2   TOTAL HIP ARTHROPLASTY Bilateral    done twice    There were no vitals filed for this visit.   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    Pertinent History HTN, B TKA x 2, B THA x2, Diastolic heart failure, back pain, tachycardia    Limitations difficulty walking, decreased standing tolerance, impaired transfers and functional mobility, impaired LB dressing, Limited L hip A/PROM, impaired dynamic balance, difficulty w/ LB dressing-fitting shoes, socks, pants    Repetition Increases Symptoms    Special Tests Intake  FOTO score = 55/100; + Stemmer    Patient Stated Goals get leg swelling under control; I want to be able to get out and walk    Pain Onset Other (comment)                          OT Treatments/Exercises (OP) - 11/19/21 1619       ADLs   ADL Education Given Yes      Manual Therapy   Manual Therapy Edema management    Manual Lymphatic Drainage (MLD) MLD to LLE/LLQ MLD utilizing short neck sequence, functional L inguinal LN, and proximal to distal J strokes to thigh, knee, leg and foot. Good tolerance.    Compression Bandaging Circ Aid with Mod I (extra time and foot stool)                    OT Education - 11/19/21 1620     Education Details Continued Pt/ CG edu for lymphedema self care  and home program throughout session. Topics include multilayer, gradient compression wrapping, simple self-MLD, therapeutic lymphatic pumping exercises, skin/nail care, risk reduction factors and LE precautions, compression garments/recommendations and wear and care schedule and compression garment donning / doffing using assistive devices. All questions answered to the Pt's satisfaction. Pt demonstrates understanding  by accurate return.    Person(s) Educated Patient    Methods Explanation;Demonstration;Handout    Comprehension Verbalized understanding;Returned demonstration;Need further instruction                 OT Long Term Goals - 11/19/21 1617       OT LONG TERM GOAL #1   Title Given this patient's low Intake score of 55/100 on the functional outcomes FOTO tool, patient will experience an increase in function of 2 points to improve basic and instrumental ADLs performance, including lymphedema self-care.    Baseline 55/100    Time 12    Period Weeks    Status Not Met   Intake FOTO score: 55/100%. Final FOTO 5/51/23 = 52/100%. 3 point decrease overall. Goal not met.     OT LONG TERM GOAL #2   Title Pt will demonstrate understanding of lymphedema prevention  strategies by identifying and discussing 5 precautions using printed reference (modified assistance) to reduce risk of progression and to limit infection risk.    Baseline Max A    Time 4    Period Days    Status Achieved    Target Date --   4th OT visit     OT LONG TERM GOAL #3   Title Pt will be able to apply knee length, multi-layer, short stretch compression wraps to one limb at a time using gradient techniques with MODIFIED INDEPENDENCE (extra time) to decrease limb volume, to limit infection risk, and to limit lymphedema progression. 08/31/21: Goal revised:   "With Maximum CG assistance"    Baseline Max A    Time 4    Period Days    Status Not Met   Patient's family member has learned to apply wraps using gradient techniques, however, limb is not consistently wrapped on a daily basis as is recommended for optimal volume reduction. 10/28/21: Pt never achieved compression wrap awpplication goal.   Target Date --   4th OT visit     OT LONG TERM GOAL #4   Title Pt will achieve at least a 10% limb volume reduction below the knee bilaterally to return limb to more normal size and shape, to limit infection risk, to decrease pain, to improve function, and to limit lymphedema progression.    Baseline Dependent    Time 12    Period Weeks    Status Not Met   LLE overall INCREASED by 9.23% in volume below the knee. GOAL not met due to inconsistent compression. No CG support as recommended.   Target Date 10/29/21      OT LONG TERM GOAL #5   Title Pt will achieve and sustain a least 85% compliance with all LE self-care home program components throughout Intensive Phase CDT, including daily skin inspection and care, lymphatic pumping ther ex, 23/7 compression wraps and simple self-MLD, to sustain clinical gains made in CDT and to limit lymphedema progression and further functional decline.    Baseline D    Time 12    Period Weeks    Status Not Met   minimal compliance w LE self care home program  between sessions   Target Date 10/29/21      OT LONG TERM GOAL #6   Title Pt will be able to perform all lymphedema self-care home program components using correct techniques with extra time and assistive devices PRN (modified independence), including simple self MLD, lymphatic pumping exercise, don and doff appropriate compression garments/ devices with Max A, and perform daily skin  care regime to limit lymphedema progression and infection risk.    Baseline D    Time 12    Period Weeks    Status Not Met    Target Date 10/29/21                   Plan - 11/19/21 1613     Clinical Impression Statement Pt's L leg more swollen than last visit. It's doubtful that she is being diligent with LLE compression. Provided MLD utilizing functional inguinal LN without increased pain. Pt applied compression garment alternative with ax A to reposition on footstool. Once left leg repositioned on stool and hip is flexed and externally rotated with knee bent, Mary [name] is able to fininsh donning her CircAid with modified Independence. She agrees with plan to return for one month follow along , when hopefully she has obtained Flexitouch device for home self care. She'll call in the meantime PRN. Her progress has reached a clinical plateau and today marks her transition to LE self management phase of CDT.    OT Occupational Profile and History Detailed Assessment- Review of Records and additional review of physical, cognitive, psychosocial history related to current functional performance    Occupational performance deficits (Please refer to evaluation for details): ADL's;IADL's;Rest and Sleep;Leisure;Social Participation;Work    Marketing executive / Function / Physical Skills ADL;ROM;IADL;Edema;Balance;Skin integrity;Mobility;Flexibility;Decreased knowledge of precautions;Decreased knowledge of use of DME    Rehab Potential Good    Clinical Decision Making Several treatment options, min-mod task modification  necessary    Comorbidities Affecting Occupational Performance: Presence of comorbidities impacting occupational performance    Modification or Assistance to Complete Evaluation  Min-Moderate modification of tasks or assist with assess necessary to complete eval    OT Frequency 2x / week    OT Duration 12 weeks    OT Treatment/Interventions Self-care/ADL training;DME and/or AE instruction;Manual lymph drainage;Compression bandaging;Therapeutic activities;Coping strategies training;Therapeutic exercise;Patient/family education;Manual Therapy    Consulted and Agree with Plan of Care Patient             Patient will benefit from skilled therapeutic intervention in order to improve the following deficits and impairments:   Body Structure / Function / Physical Skills: ADL, ROM, IADL, Edema, Balance, Skin integrity, Mobility, Flexibility, Decreased knowledge of precautions, Decreased knowledge of use of DME       Visit Diagnosis: Lymphedema, not elsewhere classified    Problem List Patient Active Problem List   Diagnosis Date Noted   Diastolic heart failure ([place]) 07/30/2021   Hyperlipidemia 07/30/2021   Lymphedema 07/30/2021   Tachycardia 06/02/2021   Gait instability 06/02/2021   Primary hypertension 06/02/2021    Ansel Bong, OT 11/19/2021, 4:20 PM  [place] MAIN [facility] SERVICES [address] [place], [place], [address] Phone: [phone]   Fax:  [phone]  Name: Mary [name] MRN: [id number] Date of Birth: February 26, 1941

## 2021-11-19 NOTE — Therapy (Signed)
OUTPATIENT PHYSICAL THERAPY TREATMENT NOTE   Patient Name: Mary [name] MRN: [id number] DOB:06-06-1940, 81 y.o., female Today's Date: 11/19/2021  PCP: Valerie Roys, DO REFERRING PROVIDER: Minna Merritts, MD   PT End of Session - 11/19/21 1532     Visit Number 26    Number of Visits 72    Date for PT Re-Evaluation 01/20/22    Authorization Type Medicare Primary, [employer] Secondary    Authorization Time Period 08/04/21-10/27/21; 10/28/2021- 01/20/2022    Progress Note Due on Visit 30    PT Start Time 1600    PT Stop Time 1645    PT Time Calculation (min) 45 min    Equipment Utilized During Treatment Gait belt    Activity Tolerance Patient tolerated treatment well;No increased pain    Behavior During Therapy WFL for tasks assessed/performed             Past Medical History:  Diagnosis Date   Arrhythmia    Back pain    Depression with anxiety    Hip pain    Hyperlipidemia    Hypertension    Neuromuscular disorder (HCC)    Paroxysmal tachycardia ([place])    Past Surgical History:  Procedure Laterality Date   ABDOMINAL HYSTERECTOMY     BREAST BIOPSY     BREAST EXCISIONAL BIOPSY Left 2002   benign   CARDIAC CATHETERIZATION     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   x2   TOTAL HIP ARTHROPLASTY Bilateral    done twice   Patient Active Problem List   Diagnosis Date Noted   Diastolic heart failure ([place]) 07/30/2021   Hyperlipidemia 07/30/2021   Lymphedema 07/30/2021   Tachycardia 06/02/2021   Gait instability 06/02/2021   Primary hypertension 06/02/2021    REFERRING DIAG: R26.81 (ICD-10-CM) - Gait instability  THERAPY DIAG:  Difficulty in walking, not elsewhere classified  Unsteadiness on feet  Other abnormalities of gait and mobility  Abnormality of gait and mobility  Muscle weakness (generalized)  Other lack of coordination  Rationale for Evaluation and Treatment Rehabilitation  PERTINENT HISTORY: Pt is a pleasant 81 y/o female referred to PT for gait instability.  Pt ambulates with RW. She reports onset of gait instability started several years ago. 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 Pt has had PT in past following surgeries. Pt reports hx of frequent falls. She has had 2 falls in past 6 months. Pt believes falls are due to difficulty picking up L foot and dragging L foot. Pt reports no injury with falls.  Pt being seen by OT for lymphedema, reports LLE more affected than RLE. Pt currently lives alone. Her spouse passed away recently in June 13, 2022. Her adult son and DIL live nearby and can assist her. Pt denies any dizziness or lightheadedness. Pt reports no pain currently. 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   PRECAUTIONS: N/A  SUBJECTIVE: Pt reports nothing new since last session. Has not attended water aerobics due to weather. Has performed some exercises in HEP. Has had company all week with sister in town.    PAIN:  Are you having pain? No     TODAY'S TREATMENT:   INTERVENTIONS.    Therapeutic Exercises:    -Standing slow march with BUE support x 20 reps total   -6" step-up with BUE support x10 each LE   -  Standing donkey kick - BLE 2x15 reps   -Standing hip abd BLE 2x15 reps  -Lateral stepping over 6" hurdle, 2x15 reps   -Seated Hip add squeeze with ball with 3 sec isometric 2x15 reps   -Seated hamstring curl with BTB 2x15 reps BLE    -Seated hip abduction with 3 sec isometric using RTB x15, BTB x15   Neuromuscular Rehabilitation   -Lateral stepping over 6" hurdle, 2x15 reps  -Step tap with 1 UE support x 20 steps each LE     Education provided throughout session via VC/TC and demonstration to facilitate movement at target joints and correct muscle activation for all testing and exercises performed.    PATIENT EDUCATION: Education details: POC, body mechanics,  muscle activation  Person educated: Patient Education method: Explanation, Demonstration, Tactile cues, and Verbal cues Education comprehension: verbalized understanding, returned demonstration, verbal cues required, tactile cues required, and needs further education   HOME EXERCISE PROGRAM: 08/04/2021: Access Code: [email or web address] 09/28/2021 Access Code: [email or web address] 10/28/2021 Access Code: [email or web address]     PT Short Term Goals      PT SHORT TERM GOAL #1   Title Pt will be independent with HEP in order to improve strength and balance in order to decrease fall risk and improve function at home and work.    Baseline 3/7: provided pt with initial HEP 4/5; compliant    Time 6    Period Weeks    Status Achieved    Target Date 09/15/21              PT Long Term Goals       PT LONG TERM GOAL #1   Title Patient will increase FOTO score to equal to or greater than 52 to demonstrate statistically significant improvement in mobility and quality of life.    Baseline 3/7: 50 4/5: 55%; 10/28/2021=53%    Time 12    Period Weeks    Status Achieved    Target Date 10/27/21      PT LONG TERM GOAL #2   Title Pt will improve BERG by at least 3 points in order to demonstrate clinically significant improvement in balance.    Baseline 3/7: 40/56 4/5: 44/56    Time 12    Period Weeks    Status Achieved    Target Date 10/27/21      PT LONG TERM GOAL #3   Title Pt will decrease 5TSTS by at least 3 seconds in order to demonstrate clinically significant improvement in LE strength.    Baseline 3/7: 15.36 sec with use of BUEs to assist 4/5: 9.6 seconds SUE support    Time 12    Period Weeks    Status Achieved    Target Date 10/27/21      PT LONG TERM GOAL #4   Title Pt will improve ABC by at least 13% in order to demonstrate clinically significant improvement in balance confidence.    Baseline 3/7: 50.63% 4/5: 62% 5/31= 63%    Time 12    Period Weeks    Status Partially Met    Target Date 01/20/22      PT  LONG TERM GOAL #5   Title Patient will increase 10 meter walk test to >1.66ms as to improve gait speed for better community ambulation and to reduce fall risk.    Baseline 3/7: 0.75 m/s with 4WW 4/5: 0.96 m/s with 4 WW; 10/28/2021= 0.97 m/s with 4WW    Time 12    Period Weeks  Status Partially Met    Target Date 01/20/22      Additional Long Term Goals   Additional Long Term Goals Yes      PT LONG TERM GOAL #6   Title Patient will demonstrate improved balance by exhibiting ability to single leg stance > 3 sec consistently without UE support for improved dynamic balance.    Baseline 10/28/2021= 1 sec each LE prior to requiring UE support    Time 12    Period Weeks    Status New    Target Date 01/20/22      PT LONG TERM GOAL #7   Title Patient will present with improved right knee AROM <10 deg knee ext from zero for decreased limp and leg length discrepancy to aide in walking and weight shifting.    Baseline 10/28/2021= 15 deg from zero (right knee ext)    Time 12    Period Weeks    Status New    Target Date 01/20/22      PT LONG TERM GOAL #8   Title Patient will demo modified Ind floor to sit transfer using chair for improved ability to get up off the floor for decreased caregiver or EMS Support.    Baseline 10/28/2021= Not formally assessed yet patient reports difficulty and desire to practice to be able to perform on her own.    Time 12    Period Weeks    Status New    Target Date 01/20/22              Plan     Clinical Impression Statement Patient is pleasant and motivated in session. Strengthening was progressed with an additional set of each exercise; pt did report feeling increased fatigue by end of session. Pt demonstrated good foot clearance during hurdles activity, only knocking hurdle once during both sets. Occasional seated rest breaks taken. Patient will continue to benefit from skilled physical therapy to improve her strength, balance, and mobility.    Personal  Factors and Comorbidities Age;Past/Current Experience;Time since onset of injury/illness/exacerbation;Fitness;Comorbidity 3+    Comorbidities HTN, BTKA x1 R and 2xL, B THA x2, diastolic heart failure, back pain, tachycardia    Examination-Activity Limitations Bend;Reach Overhead;Stairs;Transfers;Lift;Squat;Locomotion Level;Carry;Stand;Dressing    Examination-Participation Restrictions Volunteer;Community Activity;Shop;Cleaning;Laundry;Yard Work    [employer] officer Evolving/Moderate complexity    Rehab Potential Good    PT Frequency 2x / week    PT Duration 12 weeks    PT Treatment/Interventions ADLs/Self Care Home Management;Aquatic Therapy;Biofeedback;Canalith Repostioning;Cryotherapy;Electrical Stimulation;Moist Heat;Traction;Ultrasound;DME Instruction;Gait training;Functional Biochemist, clinical;Therapeutic activities;Therapeutic exercise;Balance training;Neuromuscular re-education;Patient/family education;Orthotic Fit/Training;Wheelchair mobility training;Manual techniques;Passive range of motion;Scar mobilization;Dry needling;Energy conservation;Splinting;Taping;Vestibular;Visual/perceptual remediation/compensation;Joint Manipulations    PT Next Visit Plan strengthening, gait, balance, ROM- manual therapy for Improved ROM RIght knee.    PT Home Exercise Plan Access Code: [email or web address]; 5/1: Access Code: [email or web address]; 10/28/2021=Access Code: [email or web address]  URL: [email or web address]    Consulted and Agree with Plan of Care Patient              Patrina Levering PT, DPT

## 2021-11-24 ENCOUNTER — Ambulatory Visit: Payer: Medicare Other

## 2021-11-24 ENCOUNTER — Encounter: Payer: Medicare Other | Admitting: Occupational Therapy

## 2021-11-24 DIAGNOSIS — R2681 Unsteadiness on feet: Secondary | ICD-10-CM

## 2021-11-24 DIAGNOSIS — R262 Difficulty in walking, not elsewhere classified: Secondary | ICD-10-CM

## 2021-11-24 DIAGNOSIS — M6281 Muscle weakness (generalized): Secondary | ICD-10-CM

## 2021-11-24 NOTE — Therapy (Deleted)
OUTPATIENT PHYSICAL THERAPY TREATMENT NOTE   Patient Name: Mary [name] MRN: [id number] DOB:1940-06-18, 81 y.o., female Today's Date: 11/24/2021  PCP: Valerie Roys, DO REFERRING PROVIDER: Minna Merritts, MD   PT End of Session - 11/24/21 1456     Visit Number 27    Number of Visits 71    Date for PT Re-Evaluation 01/20/22    Authorization Type Medicare Primary, [employer] Secondary    Authorization Time Period 08/04/21-10/27/21; 10/28/2021- 01/20/2022    Progress Note Due on Visit 30    PT Start Time 1355    PT Stop Time 1430    PT Time Calculation (min) 35 min    Equipment Utilized During Treatment Gait belt    Activity Tolerance Patient tolerated treatment well;No increased pain    Behavior During Therapy WFL for tasks assessed/performed              Past Medical History:  Diagnosis Date   Arrhythmia    Back pain    Depression with anxiety    Hip pain    Hyperlipidemia    Hypertension    Neuromuscular disorder (HCC)    Paroxysmal tachycardia ([place])    Past Surgical History:  Procedure Laterality Date   ABDOMINAL HYSTERECTOMY     BREAST BIOPSY     BREAST EXCISIONAL BIOPSY Left 2002   benign   CARDIAC CATHETERIZATION     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   x2   TOTAL HIP ARTHROPLASTY Bilateral    done twice   Patient Active Problem List   Diagnosis Date Noted   Diastolic heart failure ([place]) 07/30/2021   Hyperlipidemia 07/30/2021   Lymphedema 07/30/2021   Tachycardia 06/02/2021   Gait instability 06/02/2021   Primary hypertension 06/02/2021    REFERRING DIAG: R26.81 (ICD-10-CM) - Gait instability  THERAPY DIAG:  Muscle weakness (generalized)  Unsteadiness on feet  Difficulty in walking, not elsewhere classified  Rationale for Evaluation and Treatment Rehabilitation  PERTINENT HISTORY: Pt is a pleasant 81 y/o female referred to PT for gait instability. Pt ambulates with RW. She reports onset of gait instability started several years ago. 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 Pt has had PT in past following surgeries. Pt reports hx of frequent falls. She has had 2 falls in past 6 months. Pt believes falls are due to difficulty picking up L foot and dragging L foot. Pt reports no injury with falls.  Pt being seen by OT for lymphedema, reports LLE more affected than RLE. Pt currently lives alone. Her spouse passed away recently in 2022-07-19. Her adult son and DIL live nearby and can assist her. Pt denies any dizziness or lightheadedness. Pt reports no pain currently. 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   PRECAUTIONS: N/A  SUBJECTIVE: Pt with R elbow soreness. No other pains or falls/LOB.    PAIN:  Are you having pain? No     TODAY'S TREATMENT:   INTERVENTIONS.    Therapeutic Exercises:    -Standing slow march with BUE support 3x 20 reps alt LE. Pt uses full grasp with LUE only due to R UE pain with intervention (elbow)  -6" step-up with BUE support alt lead LE, reduced to 3 finger support and full contralateral UE support. Rates medium. Requires recovery intervals  -Standing hip ext. - BLE 2x15 reps.  Pt rates as hard.   STS 10x 2 sets. Cuing for anterior weight shift, technique  -Standing hip abd BLE 2x15 reps. Rates challenging. Requires recovery intervals    PATIENT EDUCATION: Education details: Pt educated throughout session about proper posture and technique with exercises. Improved exercise technique, movement at target joints, use of target muscles after min to mod verbal, visual, tactile cues. Person educated: Patient Education method: Explanation, Demonstration, Tactile cues, and Verbal cues Education comprehension: verbalized understanding, returned demonstration, verbal cues required, and needs further education   HOME EXERCISE PROGRAM: No updates as of  11/24/21 08/04/2021: Access Code: [email or web address] 09/28/2021 Access Code: [email or web address] 10/28/2021 Access Code: [email or web address]     PT Short Term Goals      PT SHORT TERM GOAL #1   Title Pt will be independent with HEP in order to improve strength and balance in order to decrease fall risk and improve function at home and work.    Baseline 3/7: provided pt with initial HEP 4/5; compliant    Time 6    Period Weeks    Status Achieved    Target Date 09/15/21              PT Long Term Goals       PT LONG TERM GOAL #1   Title Patient will increase FOTO score to equal to or greater than 52 to demonstrate statistically significant improvement in mobility and quality of life.    Baseline 3/7: 50 4/5: 55%; 10/28/2021=53%    Time 12    Period Weeks    Status Achieved    Target Date 10/27/21      PT LONG TERM GOAL #2   Title Pt will improve BERG by at least 3 points in order to demonstrate clinically significant improvement in balance.    Baseline 3/7: 40/56 4/5: 44/56    Time 12    Period Weeks    Status Achieved    Target Date 10/27/21      PT LONG TERM GOAL #3   Title Pt will decrease 5TSTS by at least 3 seconds in order to demonstrate clinically significant improvement in LE strength.    Baseline 3/7: 15.36 sec with use of BUEs to assist 4/5: 9.6 seconds SUE support    Time 12    Period Weeks    Status Achieved    Target Date 10/27/21      PT LONG TERM GOAL #4   Title Pt will improve ABC by at least 13% in order to demonstrate clinically significant improvement in balance confidence.    Baseline 3/7: 50.63% 4/5: 62% 5/31= 63%    Time 12    Period Weeks    Status Partially Met    Target Date 01/20/22      PT LONG TERM GOAL #5   Title Patient will increase 10 meter walk test to >1.61ms as to improve gait speed for better community ambulation and to reduce fall risk.    Baseline 3/7: 0.75 m/s with 4WW 4/5: 0.96 m/s with 4 WW; 10/28/2021= 0.97 m/s with 4WW    Time 12    Period Weeks    Status  Partially Met    Target Date 01/20/22      Additional Long Term Goals   Additional Long Term Goals Yes      PT LONG TERM GOAL #6   Title Patient will demonstrate improved balance by exhibiting ability to single leg stance > 3 sec consistently without UE support for improved  dynamic balance.    Baseline 10/28/2021= 1 sec each LE prior to requiring UE support    Time 12    Period Weeks    Status New    Target Date 01/20/22      PT LONG TERM GOAL #7   Title Patient will present with improved right knee AROM <10 deg knee ext from zero for decreased limp and leg length discrepancy to aide in walking and weight shifting.    Baseline 10/28/2021= 15 deg from zero (right knee ext)    Time 12    Period Weeks    Status New    Target Date 01/20/22      PT LONG TERM GOAL #8   Title Patient will demo modified Ind floor to sit transfer using chair for improved ability to get up off the floor for decreased caregiver or EMS Support.    Baseline 10/28/2021= Not formally assessed yet patient reports difficulty and desire to practice to be able to perform on her own.    Time 12    Period Weeks    Status New    Target Date 01/20/22              Plan     Clinical Impression Statement Patient pleasant and with excellent motivation to participate in session. Part of session dedicated to instructing pt through STS technique. Pt with initial difficulty with anterior weight shift necessary to stand without UE support from standard chair. Following cues she successfully completed multiple STS reps hands-free. Occasional seated rest breaks still required. Patient will continue to benefit from skilled physical therapy to improve her strength, balance, and mobility.    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, B THA x2, diastolic heart failure, back pain, tachycardia     Examination-Activity Limitations Bend;Reach Overhead;Stairs;Transfers;Lift;Squat;Locomotion Level;Carry;Stand;Dressing    Examination-Participation Restrictions Volunteer;Community Activity;Shop;Cleaning;Laundry;Yard Work    [employer] officer Evolving/Moderate complexity    Rehab Potential Good    PT Frequency 2x / week    PT Duration 12 weeks    PT Treatment/Interventions ADLs/Self Care Home Management;Aquatic Therapy;Biofeedback;Canalith Repostioning;Cryotherapy;Electrical Stimulation;Moist Heat;Traction;Ultrasound;DME Instruction;Gait training;Functional Biochemist, clinical;Therapeutic activities;Therapeutic exercise;Balance training;Neuromuscular re-education;Patient/family education;Orthotic Fit/Training;Wheelchair mobility training;Manual techniques;Passive range of motion;Scar mobilization;Dry needling;Energy conservation;Splinting;Taping;Vestibular;Visual/perceptual remediation/compensation;Joint Manipulations    PT Next Visit Plan strengthening, gait, balance, ROM- manual therapy for Improved ROM RIght knee.    PT Home Exercise Plan Access Code: [email or web address]; 5/1: Access Code: [email or web address]; 10/28/2021=Access Code: [email or web address]  URL: [email or web address]    Consulted and Agree with Plan of Care Patient              Ricard Dillon PT, DPT

## 2021-11-24 NOTE — Progress Notes (Deleted)
{  PT ALL NOTES:151004} 

## 2021-11-25 ENCOUNTER — Other Ambulatory Visit: Payer: Self-pay | Admitting: Family Medicine

## 2021-11-25 NOTE — Therapy (Signed)
OUTPATIENT PHYSICAL THERAPY TREATMENT NOTE   Patient Name: Mary [name] MRN: [id number] DOB:February 18, 1941, 81 y.o., female Today's Date: 11/25/2021  PCP: Valerie Roys, DO REFERRING PROVIDER: Minna Merritts, MD   PT End of Session - 11/24/21 1456     Visit Number 27    Number of Visits 19    Date for PT Re-Evaluation 01/20/22    Authorization Type Medicare Primary, [employer] Secondary    Authorization Time Period 08/04/21-10/27/21; 10/28/2021- 01/20/2022    Progress Note Due on Visit 30    PT Start Time 1355    PT Stop Time 1430    PT Time Calculation (min) 35 min    Equipment Utilized During Treatment Gait belt    Activity Tolerance Patient tolerated treatment well;No increased pain    Behavior During Therapy WFL for tasks assessed/performed             Past Medical History:  Diagnosis Date   Arrhythmia    Back pain    Depression with anxiety    Hip pain    Hyperlipidemia    Hypertension    Neuromuscular disorder (HCC)    Paroxysmal tachycardia ([place])    Past Surgical History:  Procedure Laterality Date   ABDOMINAL HYSTERECTOMY     BREAST BIOPSY     BREAST EXCISIONAL BIOPSY Left 2002   benign   CARDIAC CATHETERIZATION     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   x2   TOTAL HIP ARTHROPLASTY Bilateral    done twice   Patient Active Problem List   Diagnosis Date Noted   Diastolic heart failure ([place]) 07/30/2021   Hyperlipidemia 07/30/2021   Lymphedema 07/30/2021   Tachycardia 06/02/2021   Gait instability 06/02/2021   Primary hypertension 06/02/2021    REFERRING DIAG: R26.81 (ICD-10-CM) - Gait instability  THERAPY DIAG:  Muscle weakness (generalized)  Unsteadiness on feet  Difficulty in walking, not elsewhere classified  Rationale for Evaluation and Treatment Rehabilitation  PERTINENT HISTORY: Pt is a pleasant 81 y/o female referred to PT for gait instability. Pt ambulates with RW. She reports onset of gait instability started several years ago. 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 Pt has had PT in past following surgeries. Pt reports hx of frequent falls. She has had 2 falls in past 6 months. Pt believes falls are due to difficulty picking up L foot and dragging L foot. Pt reports no injury with falls.  Pt being seen by OT for lymphedema, reports LLE more affected than RLE. Pt currently lives alone. Her spouse passed away recently in 06/14/2022. Her adult son and DIL live nearby and can assist her. Pt denies any dizziness or lightheadedness. Pt reports no pain currently. 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   PRECAUTIONS: N/A  SUBJECTIVE: Pt with R elbow soreness. No other pains or falls/LOB.    PAIN:  Are you having pain? No     TODAY'S TREATMENT:   11/24/2021:  INTERVENTIONS.    Therapeutic Exercises:    -Standing slow march with BUE support 3x 20 reps alt LE. Pt uses full grasp with LUE only due to R UE pain with intervention (elbow)   -6" step-up with BUE support alt lead LE, reduced to 3 finger support and full contralateral UE support. Rates medium. Requires recovery intervals   -Standing hip ext. -  BLE 2x15 reps. Pt rates as hard.   STS 10x 2 sets. Cuing for anterior weight shift, technique   -Standing hip abd BLE 2x15 reps. Rates challenging. Requires recovery intervals  PATIENT EDUCATION: Education details: POC, body mechanics, muscle activation  Person educated: Patient Education method: Explanation, Demonstration, and Verbal cues Education comprehension: verbalized understanding, returned demonstration, verbal cues required, and needs further education   HOME EXERCISE PROGRAM: no updates as of 11/24/21 08/04/2021: Access Code: [email or web address] 09/28/2021 Access Code: [email or web address] 10/28/2021 Access Code: [email or web address]     PT Short Term Goals      PT SHORT TERM GOAL #1   Title Pt will  be independent with HEP in order to improve strength and balance in order to decrease fall risk and improve function at home and work.    Baseline 3/7: provided pt with initial HEP 4/5; compliant    Time 6    Period Weeks    Status Achieved    Target Date 09/15/21              PT Long Term Goals       PT LONG TERM GOAL #1   Title Patient will increase FOTO score to equal to or greater than 52 to demonstrate statistically significant improvement in mobility and quality of life.    Baseline 3/7: 50 4/5: 55%; 10/28/2021=53%    Time 12    Period Weeks    Status Achieved    Target Date 10/27/21      PT LONG TERM GOAL #2   Title Pt will improve BERG by at least 3 points in order to demonstrate clinically significant improvement in balance.    Baseline 3/7: 40/56 4/5: 44/56    Time 12    Period Weeks    Status Achieved    Target Date 10/27/21      PT LONG TERM GOAL #3   Title Pt will decrease 5TSTS by at least 3 seconds in order to demonstrate clinically significant improvement in LE strength.    Baseline 3/7: 15.36 sec with use of BUEs to assist 4/5: 9.6 seconds SUE support    Time 12    Period Weeks    Status Achieved    Target Date 10/27/21      PT LONG TERM GOAL #4   Title Pt will improve ABC by at least 13% in order to demonstrate clinically significant improvement in balance confidence.    Baseline 3/7: 50.63% 4/5: 62% 5/31= 63%    Time 12    Period Weeks    Status Partially Met    Target Date 01/20/22      PT LONG TERM GOAL #5   Title Patient will increase 10 meter walk test to >1.96ms as to improve gait speed for better community ambulation and to reduce fall risk.    Baseline 3/7: 0.75 m/s with 4WW 4/5: 0.96 m/s with 4 WW; 10/28/2021= 0.97 m/s with 4WW    Time 12    Period Weeks    Status Partially Met    Target Date 01/20/22      Additional Long Term Goals   Additional Long Term Goals Yes      PT LONG TERM GOAL #6   Title Patient will demonstrate improved  balance by exhibiting ability to single leg stance > 3 sec consistently without UE support for improved dynamic balance.    Baseline 10/28/2021= 1 sec each LE prior to requiring UE support    Time 12  Period Weeks    Status New    Target Date 01/20/22      PT LONG TERM GOAL #7   Title Patient will present with improved right knee AROM <10 deg knee ext from zero for decreased limp and leg length discrepancy to aide in walking and weight shifting.    Baseline 10/28/2021= 15 deg from zero (right knee ext)    Time 12    Period Weeks    Status New    Target Date 01/20/22      PT LONG TERM GOAL #8   Title Patient will demo modified Ind floor to sit transfer using chair for improved ability to get up off the floor for decreased caregiver or EMS Support.    Baseline 10/28/2021= Not formally assessed yet patient reports difficulty and desire to practice to be able to perform on her own.    Time 12    Period Weeks    Status New    Target Date 01/20/22              Plan     Clinical Impression Statement Patient pleasant and with excellent motivation to participate in session. Part of session dedicated to instructing pt through STS technique. Pt with initial difficulty with anterior weight shift necessary to stand without UE support from standard chair. Following cues she successfully completed multiple STS reps hands-free. Occasional seated rest breaks still required. Patient will continue to benefit from skilled physical therapy to improve her strength, balance, and mobility.    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, B THA x2, diastolic heart failure, back pain, tachycardia    Examination-Activity Limitations Bend;Reach Overhead;Stairs;Transfers;Lift;Squat;Locomotion Level;Carry;Stand;Dressing    Examination-Participation Restrictions Volunteer;Community  Activity;Shop;Cleaning;Laundry;Yard Work    [employer] officer Evolving/Moderate complexity    Rehab Potential Good    PT Frequency 2x / week    PT Duration 12 weeks    PT Treatment/Interventions ADLs/Self Care Home Management;Aquatic Therapy;Biofeedback;Canalith Repostioning;Cryotherapy;Electrical Stimulation;Moist Heat;Traction;Ultrasound;DME Instruction;Gait training;Functional Biochemist, clinical;Therapeutic activities;Therapeutic exercise;Balance training;Neuromuscular re-education;Patient/family education;Orthotic Fit/Training;Wheelchair mobility training;Manual techniques;Passive range of motion;Scar mobilization;Dry needling;Energy conservation;Splinting;Taping;Vestibular;Visual/perceptual remediation/compensation;Joint Manipulations    PT Next Visit Plan strengthening, gait, balance, ROM- manual therapy for Improved ROM RIght knee.    PT Home Exercise Plan Access Code: [email or web address]; 5/1: Access Code: [email or web address]; 10/28/2021=Access Code: [email or web address]  URL: [email or web address]    Consulted and Agree with Plan of Care Patient            Ricard Dillon PT, DPT

## 2021-11-25 NOTE — Telephone Encounter (Signed)
Requested medication (s) are due for refill today:   No but no refills remain on this rx  Requested medication (s) are on the active medication list:   Yes  Future visit scheduled:   Yes   Last ordered: 09/29/2021 #30, 2 refills  Returned because no refills remain on this rx   Requested Prescriptions  Pending Prescriptions Disp Refills   sertraline (ZOLOFT) 50 MG tablet [Pharmacy Med Name: SERTRALINE '50MG'$  TABLETS] 30 tablet 2    Sig: TAKE 1 TABLET(50 MG) BY MOUTH DAILY     Psychiatry:  Antidepressants - SSRI - sertraline Passed - 11/25/2021  8:00 AM      Passed - AST in normal range and within 360 days    AST  Date Value Ref Range Status  06/17/2021 16 0 - 40 IU/L Final         Passed - ALT in normal range and within 360 days    ALT  Date Value Ref Range Status  06/17/2021 13 0 - 32 IU/L Final         Passed - Completed PHQ-2 or PHQ-9 in the last 360 days      Passed - Valid encounter within last 6 months    Recent Outpatient Visits           3 months ago Diastolic heart failure, unspecified HF chronicity ([place])   [place], Avanti, MD   4 months ago [place], Santiago Glad, NP   4 months ago Acute pain of left knee   [facility], Avanti, MD   5 months ago Acute diastolic heart failure [facility])   [place], Avanti, MD       Future Appointments             In 3 weeks Wynetta Emery, Barb Merino, DO [place], [place]   In 4 months Nehemiah Massed, Monia Sabal, MD [place]

## 2021-11-26 ENCOUNTER — Ambulatory Visit: Payer: Medicare Other

## 2021-11-30 ENCOUNTER — Ambulatory Visit: Payer: Medicare Other | Admitting: Physical Therapy

## 2021-12-02 ENCOUNTER — Ambulatory Visit: Payer: Medicare Other | Admitting: Physical Therapy

## 2021-12-02 ENCOUNTER — Encounter: Payer: Medicare Other | Admitting: Occupational Therapy

## 2021-12-07 ENCOUNTER — Ambulatory Visit: Payer: Medicare Other | Admitting: Physical Therapy

## 2021-12-09 ENCOUNTER — Encounter: Payer: Medicare Other | Admitting: Occupational Therapy

## 2021-12-09 ENCOUNTER — Ambulatory Visit: Payer: Medicare Other | Attending: Cardiovascular Disease

## 2021-12-09 DIAGNOSIS — R2689 Other abnormalities of gait and mobility: Secondary | ICD-10-CM

## 2021-12-09 DIAGNOSIS — M6281 Muscle weakness (generalized): Secondary | ICD-10-CM | POA: Diagnosis present

## 2021-12-09 DIAGNOSIS — R262 Difficulty in walking, not elsewhere classified: Secondary | ICD-10-CM | POA: Diagnosis present

## 2021-12-09 DIAGNOSIS — R2681 Unsteadiness on feet: Secondary | ICD-10-CM

## 2021-12-09 NOTE — Therapy (Signed)
OUTPATIENT PHYSICAL THERAPY TREATMENT NOTE   Patient Name: Mary [name] MRN: [id number] DOB:Jan 28, 1941, 81 y.o., female Today's Date: 12/09/2021  PCP: Valerie Roys, DO REFERRING PROVIDER: Minna Merritts, MD   PT End of Session - 12/09/21 1603     Visit Number 28    Number of Visits 36    Date for PT Re-Evaluation 01/20/22    Authorization Type Medicare Primary, [employer] Secondary    Authorization Time Period 08/04/21-10/27/21; 10/28/2021- 01/20/2022    Progress Note Due on Visit 30    PT Start Time 1517    PT Stop Time 1600    PT Time Calculation (min) 43 min    Equipment Utilized During Treatment Gait belt    Activity Tolerance Patient tolerated treatment well;No increased pain    Behavior During Therapy WFL for tasks assessed/performed              Past Medical History:  Diagnosis Date   Arrhythmia    Back pain    Depression with anxiety    Hip pain    Hyperlipidemia    Hypertension    Neuromuscular disorder (HCC)    Paroxysmal tachycardia ([place])    Past Surgical History:  Procedure Laterality Date   ABDOMINAL HYSTERECTOMY     BREAST BIOPSY     BREAST EXCISIONAL BIOPSY Left 2002   benign   CARDIAC CATHETERIZATION     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   x2   TOTAL HIP ARTHROPLASTY Bilateral    done twice   Patient Active Problem List   Diagnosis Date Noted   Diastolic heart failure ([place]) 07/30/2021   Hyperlipidemia 07/30/2021   Lymphedema 07/30/2021   Tachycardia 06/02/2021   Gait instability 06/02/2021   Primary hypertension 06/02/2021    REFERRING DIAG: R26.81 (ICD-10-CM) - Gait instability  THERAPY DIAG:  Muscle weakness (generalized)  Unsteadiness on feet  Difficulty in walking, not elsewhere classified  Other abnormalities of gait and mobility  Rationale for Evaluation and Treatment Rehabilitation  PERTINENT HISTORY: Pt is a pleasant 81 y/o female referred to PT for gait instability. Pt ambulates with RW. She reports onset of gait instability  started several years ago. 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 Pt has had PT in past following surgeries. Pt reports hx of frequent falls. She has had 2 falls in past 6 months. Pt believes falls are due to difficulty picking up L foot and dragging L foot. Pt reports no injury with falls.  Pt being seen by OT for lymphedema, reports LLE more affected than RLE. Pt currently lives alone. Her spouse passed away recently in Jul 07, 2022. Her adult son and DIL live nearby and can assist her. Pt denies any dizziness or lightheadedness. Pt reports no pain currently. 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   PRECAUTIONS: N/A  SUBJECTIVE: Pt reports 1 fall since last session on Saturday. Fell on grass when ambulating with cane and unsure exactly what happened but reports falling forward and hit face on ground, denies any injuries. Reports slight ache in L calf, reports been "its off and on for months". Pt reports doing lots of exercises in the pool while on vacation recently.   PAIN:  Are you having pain? No     TODAY'S TREATMENT:   12/09/2021:  INTERVENTIONS.    STS: 2 x 12, rates  as "tiring but OK"    Standing hip abd: 2 x 15 each LE  Standing hamstring curl: 2 x 15 each LE  Ambulation with hurrycane for endurance: 150 ft total, VC for heel strike and upright posture  Toe raises: 2 x 15 with hold at the top of movement  Ambulation on red mat for dynamic balance: 10x, with hurrycane  LAQ: 2 x 15 each LE    PATIENT EDUCATION: Education details: exercise technique, body mechanics Person educated: Patient Education method: Explanation, Demonstration, and Verbal cues Education comprehension: verbalized understanding, returned demonstration, verbal cues required, and needs further education   HOME EXERCISE PROGRAM:  no updates as of  12/09/21 08/04/2021: Access Code: [email or web address] 09/28/2021 Access Code: [email or web address] 10/28/2021 Access Code: [email or web address]     PT Short Term Goals      PT SHORT TERM GOAL #1   Title Pt will be independent with HEP in order to improve strength and balance in order to decrease fall risk and improve function at home and work.    Baseline 3/7: provided pt with initial HEP 4/5; compliant    Time 6    Period Weeks    Status Achieved    Target Date 09/15/21              PT Long Term Goals       PT LONG TERM GOAL #1   Title Patient will increase FOTO score to equal to or greater than 52 to demonstrate statistically significant improvement in mobility and quality of life.    Baseline 3/7: 50 4/5: 55%; 10/28/2021=53%    Time 12    Period Weeks    Status Achieved    Target Date 10/27/21      PT LONG TERM GOAL #2   Title Pt will improve BERG by at least 3 points in order to demonstrate clinically significant improvement in balance.    Baseline 3/7: 40/56 4/5: 44/56    Time 12    Period Weeks    Status Achieved    Target Date 10/27/21      PT LONG TERM GOAL #3   Title Pt will decrease 5TSTS by at least 3 seconds in order to demonstrate clinically significant improvement in LE strength.    Baseline 3/7: 15.36 sec with use of BUEs to assist 4/5: 9.6 seconds SUE support    Time 12    Period Weeks    Status Achieved    Target Date 10/27/21      PT LONG TERM GOAL #4   Title Pt will improve ABC by at least 13% in order to demonstrate clinically significant improvement in balance confidence.    Baseline 3/7: 50.63% 4/5: 62% 5/31= 63%    Time 12    Period Weeks    Status Partially Met    Target Date 01/20/22      PT LONG TERM GOAL #5   Title Patient will increase 10 meter walk test to >1.48ms as to improve gait speed for better community ambulation and to reduce fall risk.    Baseline 3/7: 0.75 m/s with 4WW 4/5: 0.96 m/s with 4 WW; 10/28/2021= 0.97 m/s with 4WW    Time 12    Period Weeks    Status  Partially Met    Target Date 01/20/22      Additional Long Term Goals   Additional Long Term Goals Yes      PT LONG TERM GOAL #6   Title Patient will demonstrate improved balance  by exhibiting ability to single leg stance > 3 sec consistently without UE support for improved dynamic balance.    Baseline 10/28/2021= 1 sec each LE prior to requiring UE support    Time 12    Period Weeks    Status New    Target Date 01/20/22      PT LONG TERM GOAL #7   Title Patient will present with improved right knee AROM <10 deg knee ext from zero for decreased limp and leg length discrepancy to aide in walking and weight shifting.    Baseline 10/28/2021= 15 deg from zero (right knee ext)    Time 12    Period Weeks    Status New    Target Date 01/20/22      PT LONG TERM GOAL #8   Title Patient will demo modified Ind floor to sit transfer using chair for improved ability to get up off the floor for decreased caregiver or EMS Support.    Baseline 10/28/2021= Not formally assessed yet patient reports difficulty and desire to practice to be able to perform on her own.    Time 12    Period Weeks    Status New    Target Date 01/20/22              Plan     Clinical Impression Statement Pt pleasant and highly motivated for today's session. Focus on strengthening BLE and dynamic balance. Pt continues to demonstrate difficulties with balance when ambulating over uneven surfaces. Occasional seated rest breaks still required throughout session. Patient will continue to benefit from skilled PT to improve her strength, balance, and mobility.    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, B THA x2, diastolic heart failure, back pain, tachycardia    Examination-Activity Limitations Bend;Reach Overhead;Stairs;Transfers;Lift;Squat;Locomotion Level;Carry;Stand;Dressing     Examination-Participation Restrictions Volunteer;Community Activity;Shop;Cleaning;Laundry;Yard Work    [employer] officer Evolving/Moderate complexity    Rehab Potential Good    PT Frequency 2x / week    PT Duration 12 weeks    PT Treatment/Interventions ADLs/Self Care Home Management;Aquatic Therapy;Biofeedback;Canalith Repostioning;Cryotherapy;Electrical Stimulation;Moist Heat;Traction;Ultrasound;DME Instruction;Gait training;Functional Biochemist, clinical;Therapeutic activities;Therapeutic exercise;Balance training;Neuromuscular re-education;Patient/family education;Orthotic Fit/Training;Wheelchair mobility training;Manual techniques;Passive range of motion;Scar mobilization;Dry needling;Energy conservation;Splinting;Taping;Vestibular;Visual/perceptual remediation/compensation;Joint Manipulations    PT Next Visit Plan strengthening, gait, balance, ROM- manual therapy for Improved ROM R knee, continue POC   PT Home Exercise Plan Access Code: [email or web address]; 5/1: Access Code: [email or web address]; 10/28/2021=Access Code: [email or web address]  URL: [email or web address]    Consulted and Agree with Plan of Care Patient             Izola Price, SPT This entire session was performed under direct supervision and direction of a licensed therapist. I have personally read, edited and approve of the note as written. Ricard Dillon PT, DPT    12/09/2021 4:10 PM

## 2021-12-14 ENCOUNTER — Ambulatory Visit: Payer: Medicare Other | Admitting: Family Medicine

## 2021-12-15 ENCOUNTER — Ambulatory Visit: Payer: Medicare Other

## 2021-12-15 ENCOUNTER — Encounter: Payer: Medicare Other | Admitting: Occupational Therapy

## 2021-12-15 DIAGNOSIS — R2681 Unsteadiness on feet: Secondary | ICD-10-CM

## 2021-12-15 DIAGNOSIS — R2689 Other abnormalities of gait and mobility: Secondary | ICD-10-CM

## 2021-12-15 DIAGNOSIS — R262 Difficulty in walking, not elsewhere classified: Secondary | ICD-10-CM

## 2021-12-15 DIAGNOSIS — M6281 Muscle weakness (generalized): Secondary | ICD-10-CM

## 2021-12-15 NOTE — Therapy (Signed)
OUTPATIENT PHYSICAL THERAPY TREATMENT NOTE   Patient Name: Mary [name] MRN: [id number] DOB:25-Jan-1941, 81 y.o., female Today's Date: 12/15/2021  PCP: Valerie Roys, DO REFERRING PROVIDER: Minna Merritts, MD   PT End of Session - 12/15/21 1516     Visit Number 29    Number of Visits 94    Date for PT Re-Evaluation 01/20/22    Authorization Type Medicare Primary, [employer] Secondary    Authorization Time Period 08/04/21-10/27/21; 10/28/2021- 01/20/2022    Progress Note Due on Visit 30    PT Start Time 1518    PT Stop Time 1600    PT Time Calculation (min) 42 min    Equipment Utilized During Treatment Gait belt    Activity Tolerance Patient tolerated treatment well;No increased pain    Behavior During Therapy WFL for tasks assessed/performed               Past Medical History:  Diagnosis Date   Arrhythmia    Back pain    Depression with anxiety    Hip pain    Hyperlipidemia    Hypertension    Neuromuscular disorder (HCC)    Paroxysmal tachycardia ([place])    Past Surgical History:  Procedure Laterality Date   ABDOMINAL HYSTERECTOMY     BREAST BIOPSY     BREAST EXCISIONAL BIOPSY Left 2002   benign   CARDIAC CATHETERIZATION     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   x2   TOTAL HIP ARTHROPLASTY Bilateral    done twice   Patient Active Problem List   Diagnosis Date Noted   Diastolic heart failure ([place]) 07/30/2021   Hyperlipidemia 07/30/2021   Lymphedema 07/30/2021   Tachycardia 06/02/2021   Gait instability 06/02/2021   Primary hypertension 06/02/2021    REFERRING DIAG: R26.81 (ICD-10-CM) - Gait instability  THERAPY DIAG:  Muscle weakness (generalized)  Unsteadiness on feet  Difficulty in walking, not elsewhere classified  Other abnormalities of gait and mobility  Rationale for Evaluation and Treatment Rehabilitation  PERTINENT HISTORY: Pt is a pleasant 81 y/o female referred to PT for gait instability. Pt ambulates with RW. She reports onset of gait instability  started several years ago. 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 Pt has had PT in past following surgeries. Pt reports hx of frequent falls. She has had 2 falls in past 6 months. Pt believes falls are due to difficulty picking up L foot and dragging L foot. Pt reports no injury with falls.  Pt being seen by OT for lymphedema, reports LLE more affected than RLE. Pt currently lives alone. Her spouse passed away recently in Jul 04, 2022. Her adult son and DIL live nearby and can assist her. Pt denies any dizziness or lightheadedness. Pt reports no pain currently. 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   PRECAUTIONS: N/A  SUBJECTIVE: Pt reports she has been doing well since last session. Reports no stumbles/falls. Denies any aches/pains.    PAIN:  Are you having pain? No     TODAY'S TREATMENT:  12/15/2021 - gait belt donned and CGA provided throughout session unless otherwise noted  Seated hip add: ball squeezes, 2 x 15 with 3 sec hold  Seated heel raises: 2 x 15 with 1-2 sec hold  Seated toe raises: 2 x 15 with 1-2 sec hold    Standing hip abd:  2 x 15 each LE  Standing hip ext: 2 x 15 each LE  6" step ups: 2 x 10 each LE, tiring for pt  Obstacle course for dynamic balance: 4x, rates as challenging; ambulating over red mat, 2 half foam rolls, & weaving through cones  LAQ: 20x each LE    PATIENT EDUCATION: Education details: exercise technique, body mechanics Person educated: Patient Education method: Explanation, Demonstration, and Verbal cues Education comprehension: verbalized understanding, returned demonstration, verbal cues required, and needs further education   HOME EXERCISE PROGRAM:  no updates as of 12/15/21 08/04/2021: Access Code: [email or web address] 09/28/2021 Access Code: [email or web address] 10/28/2021 Access Code: [email or web address]     PT  Short Term Goals      PT SHORT TERM GOAL #1   Title Pt will be independent with HEP in order to improve strength and balance in order to decrease fall risk and improve function at home and work.    Baseline 3/7: provided pt with initial HEP 4/5; compliant    Time 6    Period Weeks    Status Achieved    Target Date 09/15/21              PT Long Term Goals       PT LONG TERM GOAL #1   Title Patient will increase FOTO score to equal to or greater than 52 to demonstrate statistically significant improvement in mobility and quality of life.    Baseline 3/7: 50 4/5: 55%; 10/28/2021=53%    Time 12    Period Weeks    Status Achieved    Target Date 10/27/21      PT LONG TERM GOAL #2   Title Pt will improve BERG by at least 3 points in order to demonstrate clinically significant improvement in balance.    Baseline 3/7: 40/56 4/5: 44/56    Time 12    Period Weeks    Status Achieved    Target Date 10/27/21      PT LONG TERM GOAL #3   Title Pt will decrease 5TSTS by at least 3 seconds in order to demonstrate clinically significant improvement in LE strength.    Baseline 3/7: 15.36 sec with use of BUEs to assist 4/5: 9.6 seconds SUE support    Time 12    Period Weeks    Status Achieved    Target Date 10/27/21      PT LONG TERM GOAL #4   Title Pt will improve ABC by at least 13% in order to demonstrate clinically significant improvement in balance confidence.    Baseline 3/7: 50.63% 4/5: 62% 5/31= 63%    Time 12    Period Weeks    Status Partially Met    Target Date 01/20/22      PT LONG TERM GOAL #5   Title Patient will increase 10 meter walk test to >1.43ms as to improve gait speed for better community ambulation and to reduce fall risk.    Baseline 3/7: 0.75 m/s with 4WW 4/5: 0.96 m/s with 4 WW; 10/28/2021= 0.97 m/s with 4WW    Time 12    Period Weeks    Status Partially Met    Target Date 01/20/22      Additional Long Term Goals   Additional Long Term Goals Yes       PT LONG TERM GOAL #6   Title Patient will demonstrate improved balance by exhibiting ability to single leg stance > 3 sec consistently without UE support for improved dynamic balance.  Baseline 10/28/2021= 1 sec each LE prior to requiring UE support    Time 12    Period Weeks    Status New    Target Date 01/20/22      PT LONG TERM GOAL #7   Title Patient will present with improved right knee AROM <10 deg knee ext from zero for decreased limp and leg length discrepancy to aide in walking and weight shifting.    Baseline 10/28/2021= 15 deg from zero (right knee ext)    Time 12    Period Weeks    Status New    Target Date 01/20/22      PT LONG TERM GOAL #8   Title Patient will demo modified Ind floor to sit transfer using chair for improved ability to get up off the floor for decreased caregiver or EMS Support.    Baseline 10/28/2021= Not formally assessed yet patient reports difficulty and desire to practice to be able to perform on her own.    Time 12    Period Weeks    Status New    Target Date 01/20/22              Plan     Clinical Impression Statement Continued focus on BLE strengthening and dynamic balance. Pt demonstrated improved balance with obstacle course noted today with no LOB negotiating obstacles. Pt with increased tightness in R hip, however, reports no pain/discomfort with strengthening exercises. Occasional seated rest breaks still required throughout session. The pt will continue to benefit from skilled PT to improve her strength, balance, and mobility.   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, B THA x2, diastolic heart failure, back pain, tachycardia    Examination-Activity Limitations Bend;Reach Overhead;Stairs;Transfers;Lift;Squat;Locomotion Level;Carry;Stand;Dressing    Examination-Participation Restrictions Volunteer;Community  Activity;Shop;Cleaning;Laundry;Yard Work    [employer] officer Evolving/Moderate complexity    Rehab Potential Good    PT Frequency 2x / week    PT Duration 12 weeks    PT Treatment/Interventions ADLs/Self Care Home Management;Aquatic Therapy;Biofeedback;Canalith Repostioning;Cryotherapy;Electrical Stimulation;Moist Heat;Traction;Ultrasound;DME Instruction;Gait training;Functional Biochemist, clinical;Therapeutic activities;Therapeutic exercise;Balance training;Neuromuscular re-education;Patient/family education;Orthotic Fit/Training;Wheelchair mobility training;Manual techniques;Passive range of motion;Scar mobilization;Dry needling;Energy conservation;Splinting;Taping;Vestibular;Visual/perceptual remediation/compensation;Joint Manipulations    PT Next Visit Plan strengthening, gait, balance, ROM as indicated, continue POC       Consulted and Agree with Plan of Care Patient             Izola Price, SPT  This entire session was performed under direct supervision and direction of a licensed therapist. I have personally read, edited and approve of the note as written. Ricard Dillon PT, DPT   12/15/2021 4:16PM

## 2021-12-16 ENCOUNTER — Encounter: Payer: Self-pay | Admitting: Family Medicine

## 2021-12-16 ENCOUNTER — Ambulatory Visit (INDEPENDENT_AMBULATORY_CARE_PROVIDER_SITE_OTHER): Payer: Medicare Other | Admitting: Family Medicine

## 2021-12-16 VITALS — BP 101/58 | HR 64 | Temp 98.3°F | Wt 179.8 lb

## 2021-12-16 DIAGNOSIS — R300 Dysuria: Secondary | ICD-10-CM | POA: Diagnosis not present

## 2021-12-16 DIAGNOSIS — S41101A Unspecified open wound of right upper arm, initial encounter: Secondary | ICD-10-CM

## 2021-12-16 DIAGNOSIS — I10 Essential (primary) hypertension: Secondary | ICD-10-CM

## 2021-12-16 DIAGNOSIS — E785 Hyperlipidemia, unspecified: Secondary | ICD-10-CM | POA: Diagnosis not present

## 2021-12-16 DIAGNOSIS — F339 Major depressive disorder, recurrent, unspecified: Secondary | ICD-10-CM | POA: Diagnosis not present

## 2021-12-16 DIAGNOSIS — Z23 Encounter for immunization: Secondary | ICD-10-CM | POA: Diagnosis not present

## 2021-12-16 DIAGNOSIS — G4700 Insomnia, unspecified: Secondary | ICD-10-CM | POA: Insufficient documentation

## 2021-12-16 DIAGNOSIS — Z1231 Encounter for screening mammogram for malignant neoplasm of breast: Secondary | ICD-10-CM

## 2021-12-16 DIAGNOSIS — I5030 Unspecified diastolic (congestive) heart failure: Secondary | ICD-10-CM

## 2021-12-16 DIAGNOSIS — F5102 Adjustment insomnia: Secondary | ICD-10-CM

## 2021-12-16 DIAGNOSIS — Z1159 Encounter for screening for other viral diseases: Secondary | ICD-10-CM

## 2021-12-16 LAB — URINALYSIS, ROUTINE W REFLEX MICROSCOPIC
Bilirubin, UA: NEGATIVE
Glucose, UA: NEGATIVE
Ketones, UA: NEGATIVE
Leukocytes,UA: NEGATIVE
Nitrite, UA: NEGATIVE
Protein,UA: NEGATIVE
RBC, UA: NEGATIVE
Specific Gravity, UA: >1.03 — ABNORMAL HIGH (ref 1.005–1.030)
Urobilinogen, Ur: 0.2 mg/dL (ref 0.2–1.0)
pH, UA: 5.5 (ref 5.0–7.5)

## 2021-12-16 LAB — MICROALBUMIN, URINE WAIVED
Creatinine, Urine Waived: 100 mg/dL (ref 10–300)
Microalb, Ur Waived: 10 mg/L (ref 0–19)
Microalb/Creat Ratio: <30 mg/g (ref <30)

## 2021-12-16 MED ORDER — TRAZODONE HCL 50 MG PO TABS: 25.0000 mg | ORAL_TABLET | Freq: Every evening | ORAL | 3 refills | Status: DC | PRN

## 2021-12-16 MED ORDER — SERTRALINE HCL 50 MG PO TABS
ORAL_TABLET | ORAL | 1 refills | Status: DC
Start: 2021-12-16 — End: 2022-06-17

## 2021-12-16 MED ORDER — BUPROPION HCL ER (XL) 150 MG PO TB24: 150.0000 mg | ORAL_TABLET | Freq: Every day | ORAL | 1 refills | Status: DC

## 2021-12-16 MED ORDER — METOPROLOL SUCCINATE ER 25 MG PO TB24: 12.5000 mg | ORAL_TABLET | Freq: Every day | ORAL | 1 refills | Status: DC

## 2021-12-16 NOTE — Assessment & Plan Note (Signed)
Euvolemic today. Call with any concerns. Continue to monitor.

## 2021-12-16 NOTE — Progress Notes (Signed)
BP (!) 101/58   Pulse 64   Temp 98.3 F (36.8 C)   Wt 179 lb 12.8 oz (81.6 kg)   SpO2 97%   BMI 30.86 kg/m    Subjective:    Patient ID: Mary [name], female    DOB: [birth year], 81 y.o.   MRN: [id number]  HPI: Mary [name] is a 81 y.o. female  Chief Complaint  Patient presents with   Hypertension   Hyperlipidemia   Diastolic heart failure    Insomnia    Patient states she is having trouble sleeping, only can stay asleep about 2 hours. Patient states her husband passed away in Jun 25, 2022 and since then has been having trouble sleeping some nights. Patient states this past week it has been every day she can't sleep.    Urinary Frequency    Patient states last night she got up several times to use the bathroom, and feels pressure in her lower abdomen.    HYPERTENSION / HYPERLIPIDEMIA Satisfied with current treatment? yes Duration of hypertension: chronic BP monitoring frequency: not checking BP medication side effects: no Past BP meds: lasix, metoprolol Duration of hyperlipidemia: chronic Cholesterol medication side effects: no Cholesterol supplements: none Past cholesterol medications: pravastatin Medication compliance: excellent compliance Aspirin: no Recent stressors: no Recurrent headaches: no Visual changes: no Palpitations: no Dyspnea: no Chest pain: no Lower extremity edema: no Dizzy/lightheaded: no  URINARY SYMPTOMS Duration: 1 day Dysuria: yes Urinary frequency: yes Urgency: no Small volume voids: no Symptom severity:  mild Urinary incontinence: no Foul odor: no Hematuria: no Abdominal pain: no Back pain: no Suprapubic pain/pressure: no Flank pain: no Fever:  no Vomiting: no Relief with cranberry juice: no Relief with pyridium: no Status: stable Previous urinary tract infection: yes Recurrent urinary tract infection: no History of sexually transmitted disease: no Vaginal discharge: no Treatments attempted: none   DEPRESSION Mood status:  stable Satisfied with current treatment?: yes Symptom severity: mild  Duration of current treatment : chronic Side effects: no Medication compliance: excellent compliance Psychotherapy/counseling: no  Previous psychiatric medications: sertraline, wellbutrin Depressed mood: yes Anxious mood: no Anhedonia: no Significant weight loss or gain: no Insomnia: yes hard to fall asleep Fatigue: yes Feelings of worthlessness or guilt: no Impaired concentration/indecisiveness: no Suicidal ideations: no Hopelessness: no Crying spells: no    12/16/2021   11:41 AM 11/03/2021   12:28 PM 07/30/2021   10:55 AM 07/02/2021    3:05 PM  Depression screen PHQ 2/9  Decreased Interest 0 0 1   Down, Depressed, Hopeless 1 0 2 2  PHQ - 2 Score 1 0 3 2  Altered sleeping 1  0 0  Tired, decreased energy 0  0 0  Change in appetite 0  0 0  Feeling bad or failure about yourself  1  0 0  Trouble concentrating 0  0 2  Moving slowly or fidgety/restless 0  1 0  Suicidal thoughts 0   0  PHQ-9 Score '3  4 4  '$ Difficult doing work/chores    Somewhat difficult     Relevant past medical, surgical, family and social history reviewed and updated as indicated. Interim medical history since our last visit reviewed. Allergies and medications reviewed and updated.  Review of Systems  Constitutional: Negative.   Respiratory: Negative.    Cardiovascular: Negative.   Gastrointestinal: Negative.   Genitourinary:  Positive for dysuria and frequency. Negative for decreased urine volume, difficulty urinating, dyspareunia, enuresis, flank pain, genital sores, hematuria, menstrual problem, pelvic pain, urgency, vaginal bleeding,  vaginal discharge and vaginal pain.  Musculoskeletal: Negative.   Psychiatric/Behavioral: Negative.      Per HPI unless specifically indicated above     Objective:    BP (!) 101/58   Pulse 64   Temp 98.3 F (36.8 C)   Wt 179 lb 12.8 oz (81.6 kg)   SpO2 97%   BMI 30.86 kg/m   Wt Readings from  Last 3 Encounters:  12/16/21 179 lb 12.8 oz (81.6 kg)  11/03/21 182 lb 3.2 oz (82.6 kg)  09/01/21 182 lb (82.6 kg)    Physical Exam Vitals and nursing note reviewed.  Constitutional:      General: She is not in acute distress.    Appearance: Normal appearance. She is obese. She is not ill-appearing, toxic-appearing or diaphoretic.  HENT:     Head: Normocephalic and atraumatic.     Right Ear: External ear normal.     Left Ear: External ear normal.     Nose: Nose normal.     Mouth/Throat:     Mouth: Mucous membranes are moist.     Pharynx: Oropharynx is clear.  Eyes:     General: No scleral icterus.       Right eye: No discharge.        Left eye: No discharge.     Extraocular Movements: Extraocular movements intact.     Conjunctiva/sclera: Conjunctivae normal.     Pupils: Pupils are equal, round, and reactive to light.  Cardiovascular:     Rate and Rhythm: Normal rate and regular rhythm.     Pulses: Normal pulses.     Heart sounds: Normal heart sounds. No murmur heard.    No friction rub. No gallop.  Pulmonary:     Effort: Pulmonary effort is normal. No respiratory distress.     Breath sounds: Normal breath sounds. No stridor. No wheezing, rhonchi or rales.  Chest:     Chest wall: No tenderness.  Musculoskeletal:        General: Normal range of motion.     Cervical back: Normal range of motion and neck supple.     Right lower leg: Edema present.     Left lower leg: Edema present.  Skin:    General: Skin is warm and dry.     Capillary Refill: Capillary refill takes less than 2 seconds.     Coloration: Skin is not jaundiced or pale.     Findings: No bruising, erythema, lesion or rash.     Comments: Well healing wound on R arm consistent with scratch  Neurological:     General: No focal deficit present.     Mental Status: She is alert and oriented to person, place, and time. Mental status is at baseline.  Psychiatric:        Mood and Affect: Mood normal.        Behavior:  Behavior normal.        Thought Content: Thought content normal.        Judgment: Judgment normal.     Results for orders placed or performed in visit on 12/16/21  Urinalysis, Routine w reflex microscopic  Result Value Ref Range   Specific Gravity, UA >1.030 (H) 1.005 - 1.030   pH, UA 5.5 5.0 - 7.5   Color, UA Yellow Yellow   Appearance Ur Clear Clear   Leukocytes,UA Negative Negative   Protein,UA Negative Negative/Trace   Glucose, UA Negative Negative   Ketones, UA Negative Negative   RBC, UA Negative Negative  Bilirubin, UA Negative Negative   Urobilinogen, Ur 0.2 0.2 - 1.0 mg/dL   Nitrite, UA Negative Negative  Microalbumin, Urine Waived  Result Value Ref Range   Microalb, Ur Waived 10 0 - 19 mg/L   Creatinine, Urine Waived 100 10 - 300 mg/dL   Microalb/Creat Ratio <30 <30 mg/g      Assessment & Plan:   Problem List Items Addressed This Visit       Cardiovascular and Mediastinum   Primary hypertension    Running low. Will cut her metoprolol to 12.'5mg'$  and recheck 1 month. Call with any concerns.      Relevant Medications   metoprolol succinate (TOPROL-XL) 25 MG 24 hr tablet   Other Relevant Orders   Comprehensive metabolic panel   CBC with Differential/Platelet   Microalbumin, Urine Waived (Completed)   TSH   Diastolic heart failure ([place])    Euvolemic today. Call with any concerns. Continue to monitor.       Relevant Medications   metoprolol succinate (TOPROL-XL) 25 MG 24 hr tablet     Other   Hyperlipidemia    Under good control on current regimen. Continue current regimen. Continue to monitor. Call with any concerns. Refills given.        Relevant Medications   metoprolol succinate (TOPROL-XL) 25 MG 24 hr tablet   Other Relevant Orders   Comprehensive metabolic panel   CBC with Differential/Platelet   Lipid Panel w/o Chol/HDL Ratio   Insomnia    Will start trazodone. Call if not getting better or getting worse.       Depression, recurrent ([place])  - Primary    Under good control on current regimen. Continue current regimen. Continue to monitor. Call with any concerns. Refills given.        Relevant Medications   buPROPion (WELLBUTRIN XL) 150 MG 24 hr tablet   sertraline (ZOLOFT) 50 MG tablet   traZODone (DESYREL) 50 MG tablet   Other Visit Diagnoses     Dysuria       Relevant Orders   Urinalysis, Routine w reflex microscopic (Completed)   Urine Culture   Need for pneumococcal vaccination       Relevant Orders   Pneumococcal conjugate vaccine 20-valent (Prevnar 20) (Completed)   Open wound of right upper arm, initial encounter       Healing well. Due for Td. Given today.    Relevant Orders   Tdap vaccine greater than or equal to 7yo IM (Completed)   Need for hepatitis C screening test       Labs drawn today. Await results.    Relevant Orders   Hepatitis C Antibody   Encounter for screening mammogram for malignant neoplasm of breast       Mammogram ordered.    Relevant Orders   MM 3D SCREEN BREAST BILATERAL        Follow up plan: Return in about 4 weeks (around 01/13/2022).

## 2021-12-16 NOTE — Assessment & Plan Note (Signed)
Under good control on current regimen. Continue current regimen. Continue to monitor. Call with any concerns. Refills given.   

## 2021-12-16 NOTE — Assessment & Plan Note (Signed)
Will start trazodone. Call if not getting better or getting worse.

## 2021-12-16 NOTE — Assessment & Plan Note (Signed)
Running low. Will cut her metoprolol to 12.'5mg'$  and recheck 1 month. Call with any concerns.

## 2021-12-17 ENCOUNTER — Ambulatory Visit: Payer: Medicare Other

## 2021-12-17 DIAGNOSIS — M6281 Muscle weakness (generalized): Secondary | ICD-10-CM | POA: Diagnosis not present

## 2021-12-17 DIAGNOSIS — R2689 Other abnormalities of gait and mobility: Secondary | ICD-10-CM

## 2021-12-17 DIAGNOSIS — R2681 Unsteadiness on feet: Secondary | ICD-10-CM

## 2021-12-17 LAB — COMPREHENSIVE METABOLIC PANEL
ALT: 14 IU/L (ref 0–32)
AST: 20 IU/L (ref 0–40)
Albumin/Globulin Ratio: 1.9 (ref 1.2–2.2)
Albumin: 4.6 g/dL (ref 3.8–4.8)
Alkaline Phosphatase: 114 IU/L (ref 44–121)
BUN/Creatinine Ratio: 18 (ref 12–28)
BUN: 17 mg/dL (ref 8–27)
Bilirubin Total: 0.6 mg/dL (ref 0.0–1.2)
CO2: 24 mmol/L (ref 20–29)
Calcium: 9.7 mg/dL (ref 8.7–10.3)
Chloride: 103 mmol/L (ref 96–106)
Creatinine, Ser: 0.94 mg/dL (ref 0.57–1.00)
Globulin, Total: 2.4 g/dL (ref 1.5–4.5)
Glucose: 147 mg/dL — ABNORMAL HIGH (ref 70–99)
Potassium: 4.7 mmol/L (ref 3.5–5.2)
Sodium: 142 mmol/L (ref 134–144)
Total Protein: 7 g/dL (ref 6.0–8.5)
eGFR: 62 mL/min/{1.73_m2} (ref >59)

## 2021-12-17 LAB — CBC WITH DIFFERENTIAL/PLATELET
Basophils Absolute: 0 10*3/uL (ref 0.0–0.2)
Basos: 1 %
EOS (ABSOLUTE): 0 10*3/uL (ref 0.0–0.4)
Eos: 1 %
Hematocrit: 41.5 % (ref 34.0–46.6)
Hemoglobin: 13.3 g/dL (ref 11.1–15.9)
Immature Grans (Abs): 0 10*3/uL (ref 0.0–0.1)
Immature Granulocytes: 0 %
Lymphocytes Absolute: 1.3 10*3/uL (ref 0.7–3.1)
Lymphs: 28 %
MCH: 30.5 pg (ref 26.6–33.0)
MCHC: 32 g/dL (ref 31.5–35.7)
MCV: 95 fL (ref 79–97)
Monocytes Absolute: 0.3 10*3/uL (ref 0.1–0.9)
Monocytes: 6 %
Neutrophils Absolute: 3 10*3/uL (ref 1.4–7.0)
Neutrophils: 64 %
Platelets: 214 10*3/uL (ref 150–450)
RBC: 4.36 x10E6/uL (ref 3.77–5.28)
RDW: 13.1 % (ref 11.7–15.4)
WBC: 4.6 10*3/uL (ref 3.4–10.8)

## 2021-12-17 LAB — HEPATITIS C ANTIBODY: Hep C Virus Ab: NONREACTIVE

## 2021-12-17 LAB — LIPID PANEL W/O CHOL/HDL RATIO
Cholesterol, Total: 170 mg/dL (ref 100–199)
HDL: 76 mg/dL (ref >39)
LDL Chol Calc (NIH): 78 mg/dL (ref 0–99)
Triglycerides: 88 mg/dL (ref 0–149)
VLDL Cholesterol Cal: 16 mg/dL (ref 5–40)

## 2021-12-17 LAB — TSH: TSH: 1.49 u[IU]/mL (ref 0.450–4.500)

## 2021-12-17 NOTE — Therapy (Signed)
OUTPATIENT PHYSICAL THERAPY TREATMENT NOTE/Physical Therapy Progress Note   Dates of reporting period  10/28/2021   to   12/17/2021    Patient Name: Mary [name] MRN: [id number] DOB:Jul 09, 1940, 81 y.o., female Today's Date: 12/17/2021  PCP: Valerie Roys, DO REFERRING PROVIDER: Minna Merritts, MD   PT End of Session - 12/17/21 1525     Visit Number 30    Number of Visits 17    Date for PT Re-Evaluation 01/20/22    Authorization Type Medicare Primary, [employer] Secondary    Authorization Time Period 08/04/21-10/27/21; 10/28/2021- 01/20/2022    Progress Note Due on Visit 30    PT Start Time 1519    Equipment Utilized During Treatment Gait belt    Activity Tolerance Patient tolerated treatment well;No increased pain    Behavior During Therapy WFL for tasks assessed/performed                Past Medical History:  Diagnosis Date   Arrhythmia    Back pain    Depression with anxiety    Hip pain    Hyperlipidemia    Hypertension    Neuromuscular disorder (HCC)    Paroxysmal tachycardia ([place])    Past Surgical History:  Procedure Laterality Date   ABDOMINAL HYSTERECTOMY     BREAST BIOPSY     BREAST EXCISIONAL BIOPSY Left 2002   benign   CARDIAC CATHETERIZATION     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   x2   TOTAL HIP ARTHROPLASTY Bilateral    done twice   Patient Active Problem List   Diagnosis Date Noted   Insomnia 12/16/2021   Depression, recurrent ([place]) 44/92/0100   Diastolic heart failure ([place]) 07/30/2021   Hyperlipidemia 07/30/2021   Lymphedema 07/30/2021   Gait instability 06/02/2021   Primary hypertension 06/02/2021    REFERRING DIAG: R26.81 (ICD-10-CM) - Gait instability  THERAPY DIAG:  Unsteadiness on feet  Muscle weakness (generalized)  Other abnormalities of gait and mobility  Rationale for Evaluation and Treatment Rehabilitation  PERTINENT HISTORY: Pt is a pleasant 81 y/o female referred to PT for gait instability. Pt ambulates with RW. She reports  onset of gait instability started several years ago. 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 Pt has had PT in past following surgeries. Pt reports hx of frequent falls. She has had 2 falls in past 6 months. Pt believes falls are due to difficulty picking up L foot and dragging L foot. Pt reports no injury with falls.  Pt being seen by OT for lymphedema, reports LLE more affected than RLE. Pt currently lives alone. Her spouse passed away recently in 07-02-22. Her adult son and DIL live nearby and can assist her. Pt denies any dizziness or lightheadedness. Pt reports no pain currently. 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   PRECAUTIONS: N/A  SUBJECTIVE: Pt reports she is fine, no pain, no stumbles or falls. No concerns currently.     PAIN:  Are you having pain? No    TODAY'S TREATMENT  12/17/2021 - gait belt donned and CGA provided throughout session unless otherwise noted  Goals reassessed for progress note, see goal section for details. Pt instructed in technique with testing and performance indications, plan throughout.   Other interventions  Standing heel raises 12x  Standing DF/toe raises 12x  Mini-squats 12x. Min VC/demo  for technique    Standing hip abd 15x each LE    PATIENT EDUCATION: Education details: exercise technique, goals, testing and indications, plan Person educated: Patient Education method: Explanation, Demonstration, and Verbal cues Education comprehension: verbalized understanding, returned demonstration, verbal cues required, and needs further education   HOME EXERCISE PROGRAM:  no updates as of 12/17/21 08/04/2021: Access Code: [email or web address] 09/28/2021 Access Code: [email or web address] 10/28/2021 Access Code: [email or web address]     PT Short Term Goals      PT SHORT TERM GOAL #1   Title Pt will be independent with HEP in  order to improve strength and balance in order to decrease fall risk and improve function at home and work.    Baseline 3/7: provided pt with initial HEP 4/5; compliant    Time 6    Period Weeks    Status Achieved    Target Date 09/15/21              PT Long Term Goals       PT LONG TERM GOAL #1   Title Patient will increase FOTO score to equal to or greater than 52 to demonstrate statistically significant improvement in mobility and quality of life.    Baseline 3/7: 50 4/5: 55%; 10/28/2021=53%    Time 12    Period Weeks    Status Achieved    Target Date 10/27/21      PT LONG TERM GOAL #2   Title Pt will improve BERG by at least 3 points in order to demonstrate clinically significant improvement in balance.    Baseline 3/7: 40/56 4/5: 44/56    Time 12    Period Weeks    Status Achieved    Target Date 10/27/21      PT LONG TERM GOAL #3   Title Pt will decrease 5TSTS by at least 3 seconds in order to demonstrate clinically significant improvement in LE strength.    Baseline 3/7: 15.36 sec with use of BUEs to assist 4/5: 9.6 seconds SUE support    Time 12    Period Weeks    Status Achieved    Target Date 10/27/21      PT LONG TERM GOAL #4   Title Pt will improve ABC by at least 13% in order to demonstrate clinically significant improvement in balance confidence.    Baseline 3/7: 50.63% 4/5: 62% 5/31= 63%; 7/20: 70.31%   Time 12    Period Weeks    Status Achieved   Target Date 01/20/22      PT LONG TERM GOAL #5   Title Patient will increase 10 meter walk test to >1.57ms as to improve gait speed for better community ambulation and to reduce fall risk.    Baseline 3/7: 0.75 m/s with 4WW 4/5: 0.96 m/s with 4 WW; 10/28/2021= 0.97 m/s with 45MW 7/20: 0.96 m/s with 4WW   Time 12    Period Weeks    Status Partially Met    Target Date 01/20/22      Additional Long Term Goals   Additional Long Term Goals Yes      PT LONG TERM GOAL #6   Title Patient will demonstrate  improved balance by exhibiting ability to single leg stance > 3 sec consistently without UE support for improved dynamic balance.    Baseline 10/28/2021= 1 sec each LE prior to requiring UE support 7/20: 3 sec RLE, <1 sec LLE.   Time 12    Period Weeks    Status On-going  Target Date 01/20/22      PT LONG TERM GOAL #7   Title Patient will present with improved right knee AROM <10 deg knee ext from zero for decreased limp and leg length discrepancy to aide in walking and weight shifting.    Baseline 10/28/2021= 15 deg from zero (right knee ext) ; 7/20: 3 deg from zero    Time 12    Period Weeks    Status Achieved    Target Date 01/20/22      PT LONG TERM GOAL #8   Title Patient will demo modified Ind floor to sit transfer using chair for improved ability to get up off the floor for decreased caregiver or EMS Support.    Baseline 10/28/2021= Not formally assessed yet patient reports difficulty and desire to practice to be able to perform on her own.; 7/20: CGA and min VC/demo to complete floor to stand transfer, use of chair for UE support   Time 12    Period Weeks    Status New    Target Date 01/20/22              Plan     Clinical Impression Statement Goals reassessed on this date for progress note. Pt with similar gait speed per 10MWT compared to previous assessment. Pt with modest improvement in SLB on RLE, still challenged with SLB on LLE. Pt has achieved her knee ext. goal for RLE, achieving within 3 deg from full extension. Pt has also achieved ABC scale goal, indicating improved balance confidence. Pt still requires CGA and min VC/demo to complete floor to stand transfers.The pt will continue to benefit from skilled PT to improve her strength, balance, and mobility.   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, B THA x2, diastolic heart failure, back  pain, tachycardia    Examination-Activity Limitations Bend;Reach Overhead;Stairs;Transfers;Lift;Squat;Locomotion Level;Carry;Stand;Dressing    Examination-Participation Restrictions Volunteer;Community Activity;Shop;Cleaning;Laundry;Yard Work    [employer] officer Evolving/Moderate complexity    Rehab Potential Good    PT Frequency 2x / week    PT Duration 12 weeks    PT Treatment/Interventions ADLs/Self Care Home Management;Aquatic Therapy;Biofeedback;Canalith Repostioning;Cryotherapy;Electrical Stimulation;Moist Heat;Traction;Ultrasound;DME Instruction;Gait training;Functional Biochemist, clinical;Therapeutic activities;Therapeutic exercise;Balance training;Neuromuscular re-education;Patient/family education;Orthotic Fit/Training;Wheelchair mobility training;Manual techniques;Passive range of motion;Scar mobilization;Dry needling;Energy conservation;Splinting;Taping;Vestibular;Visual/perceptual remediation/compensation;Joint Manipulations    PT Next Visit Plan strengthening, gait, balance, ROM as indicated, continue POC       Consulted and Agree with Plan of Care Patient            Izola Price, SPT  Ricard Dillon PT, DPT This entire session was performed under direct supervision and direction of a licensed therapist. I have personally read, edited and approve of the note as written.

## 2021-12-18 ENCOUNTER — Telehealth: Payer: Self-pay

## 2021-12-18 DIAGNOSIS — R921 Mammographic calcification found on diagnostic imaging of breast: Secondary | ICD-10-CM

## 2021-12-18 LAB — URINE CULTURE

## 2021-12-18 NOTE — Telephone Encounter (Signed)
Order in.

## 2021-12-18 NOTE — Telephone Encounter (Signed)
-----   Message from Valerie Roys, [place] sent at 12/16/2021 10:47 PM EDT ----- Scheduled for bone density in August- would like to have mammogram at same time- can we see if we can get her scheduled for it at the same time?

## 2021-12-18 NOTE — Telephone Encounter (Signed)
Per jennifer at [facility], patient is due for 6 month diagnostic mammogram for Right Breast calcification. 6 month follow up due in August, so should be able to get patient scheduled with DEXA scan after receiving order.

## 2021-12-18 NOTE — Telephone Encounter (Signed)
Patient has appointment scheduled for both DEXA and Diagnostic mammo.  Also advised patient handicap placard form has been completed, will mail per patient request.

## 2021-12-21 ENCOUNTER — Ambulatory Visit: Payer: Medicare Other | Admitting: Physical Therapy

## 2021-12-23 ENCOUNTER — Encounter: Payer: Medicare Other | Admitting: Occupational Therapy

## 2021-12-30 ENCOUNTER — Ambulatory Visit: Payer: Medicare Other | Attending: Cardiovascular Disease

## 2021-12-30 ENCOUNTER — Encounter: Payer: Medicare Other | Admitting: Occupational Therapy

## 2021-12-30 DIAGNOSIS — R278 Other lack of coordination: Secondary | ICD-10-CM | POA: Diagnosis present

## 2021-12-30 DIAGNOSIS — M6281 Muscle weakness (generalized): Secondary | ICD-10-CM | POA: Diagnosis present

## 2021-12-30 DIAGNOSIS — R2681 Unsteadiness on feet: Secondary | ICD-10-CM | POA: Insufficient documentation

## 2021-12-30 DIAGNOSIS — I890 Lymphedema, not elsewhere classified: Secondary | ICD-10-CM | POA: Diagnosis present

## 2021-12-30 DIAGNOSIS — R262 Difficulty in walking, not elsewhere classified: Secondary | ICD-10-CM | POA: Diagnosis present

## 2021-12-30 DIAGNOSIS — R269 Unspecified abnormalities of gait and mobility: Secondary | ICD-10-CM | POA: Diagnosis present

## 2021-12-30 DIAGNOSIS — R2689 Other abnormalities of gait and mobility: Secondary | ICD-10-CM | POA: Diagnosis present

## 2021-12-30 NOTE — Therapy (Signed)
OUTPATIENT PHYSICAL THERAPY TREATMENT NOTE   Patient Name: Mary [name] MRN: [id number] DOB:11/06/1940, 81 y.o., female Today's Date: 12/31/2021  PCP: Valerie Roys, DO REFERRING PROVIDER: Minna Merritts, MD   PT End of Session - 12/30/21 1205     Visit Number 31    Number of Visits 46    Date for PT Re-Evaluation 01/20/22    Authorization Type Medicare Primary, A[employer] Secondary    Authorization Time Period 08/04/21-10/27/21; 10/28/2021- 01/20/2022    Progress Note Due on Visit 30    PT Start Time 1151    PT Stop Time 1230    PT Time Calculation (min) 39 min    Equipment Utilized During Treatment Gait belt    Activity Tolerance Patient tolerated treatment well;No increased pain    Behavior During Therapy WFL for tasks assessed/performed                Past Medical History:  Diagnosis Date   Arrhythmia    Back pain    Depression with anxiety    Hip pain    Hyperlipidemia    Hypertension    Neuromuscular disorder (HCC)    Paroxysmal tachycardia ([place])    Past Surgical History:  Procedure Laterality Date   ABDOMINAL HYSTERECTOMY     BREAST BIOPSY     BREAST EXCISIONAL BIOPSY Left 2002   benign   CARDIAC CATHETERIZATION     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   x2   TOTAL HIP ARTHROPLASTY Bilateral    done twice   Patient Active Problem List   Diagnosis Date Noted   Insomnia 12/16/2021   Depression, recurrent ([place]) 98/26/4158   Diastolic heart failure ([place]) 07/30/2021   Hyperlipidemia 07/30/2021   Lymphedema 07/30/2021   Gait instability 06/02/2021   Primary hypertension 06/02/2021    REFERRING DIAG: R26.81 (ICD-10-CM) - Gait instability  THERAPY DIAG:  Unsteadiness on feet  Other abnormalities of gait and mobility  Muscle weakness (generalized)  Difficulty in walking, not elsewhere classified  Abnormality of gait and mobility  Other lack of coordination  Rationale for Evaluation and Treatment Rehabilitation  PERTINENT HISTORY: Pt is a pleasant 81 y/o  female referred to PT for gait instability. Pt ambulates with RW. She reports onset of gait instability started several years ago. 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 Pt has had PT in past following surgeries. Pt reports hx of frequent falls. She has had 2 falls in past 6 months. Pt believes falls are due to difficulty picking up L foot and dragging L foot. Pt reports no injury with falls.  Pt being seen by OT for lymphedema, reports LLE more affected than RLE. Pt currently lives alone. Her spouse passed away recently in July 12, 2022. Her adult son and DIL live nearby and can assist her. Pt denies any dizziness or lightheadedness. Pt reports no pain currently. 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   PRECAUTIONS: N/A  SUBJECTIVE: Patient reports having a good vacation and no issues last week.  Denies any pain and no falls.  PAIN:  Are you having pain? No    TODAY'S TREATMENT  12/30/2021 - gait belt donned and CGA provided throughout session unless otherwise noted   Step tap with 2.5 lb. AW Alt LE  x 12 reps each  Lateral Step tap with 2.5 lb BLE x  12 reps   Forward lunge squat in // bars with BUE Support x 12 reps. VC and visual cues for correct technique  Reverse Lunge squat in // bars with BUE support x 12 reps.    Standing heel raises 12x x 2 sets  Standing  in // bars ( feet slightly staggered) forward lean toward bars then concentrating on pushing up to erect stand using mostly BUE's 2x12 reps  Mini-squats 12x. Min VC/demo for technique    Chair dips x 12 reps   PATIENT EDUCATION: Education details: exercise technique, goals, testing and indications, plan Person educated: Patient Education method: Explanation, Demonstration, and Verbal cues Education comprehension: verbalized understanding, returned demonstration, verbal  cues required, and needs further education   HOME EXERCISE PROGRAM:  no updates as of 12/17/21 08/04/2021: Access Code: [email or web address] 09/28/2021 Access Code: [email or web address] 10/28/2021 Access Code: [email or web address]     PT Short Term Goals      PT SHORT TERM GOAL #1   Title Pt will be independent with HEP in order to improve strength and balance in order to decrease fall risk and improve function at home and work.    Baseline 3/7: provided pt with initial HEP 4/5; compliant    Time 6    Period Weeks    Status Achieved    Target Date 09/15/21              PT Long Term Goals       PT LONG TERM GOAL #1   Title Patient will increase FOTO score to equal to or greater than 52 to demonstrate statistically significant improvement in mobility and quality of life.    Baseline 3/7: 50 4/5: 55%; 10/28/2021=53%    Time 12    Period Weeks    Status Achieved    Target Date 10/27/21      PT LONG TERM GOAL #2   Title Pt will improve BERG by at least 3 points in order to demonstrate clinically significant improvement in balance.    Baseline 3/7: 40/56 4/5: 44/56    Time 12    Period Weeks    Status Achieved    Target Date 10/27/21      PT LONG TERM GOAL #3   Title Pt will decrease 5TSTS by at least 3 seconds in order to demonstrate clinically significant improvement in LE strength.    Baseline 3/7: 15.36 sec with use of BUEs to assist 4/5: 9.6 seconds SUE support    Time 12    Period Weeks    Status Achieved    Target Date 10/27/21      PT LONG TERM GOAL #4   Title Pt will improve ABC by at least 13% in order to demonstrate clinically significant improvement in balance confidence.    Baseline 3/7: 50.63% 4/5: 62% 5/31= 63%; 7/20: 70.31%   Time 12    Period Weeks    Status Achieved   Target Date 01/20/22      PT LONG TERM GOAL #5   Title Patient will increase 10 meter walk test to >1.69ms as to improve gait speed for better community ambulation and to reduce fall risk.    Baseline 3/7: 0.75 m/s with  4WW 4/5: 0.96 m/s with 4 WW; 10/28/2021= 0.97 m/s with 4WW; 7/20: 0.96 m/s with 4WW   Time 12    Period Weeks    Status Partially Met    Target Date 01/20/22      Additional Long Term Goals   Additional  Long Term Goals Yes      PT LONG TERM GOAL #6   Title Patient will demonstrate improved balance by exhibiting ability to single leg stance > 3 sec consistently without UE support for improved dynamic balance.    Baseline 10/28/2021= 1 sec each LE prior to requiring UE support 7/20: 3 sec RLE, <1 sec LLE.   Time 12    Period Weeks    Status On-going    Target Date 01/20/22      PT LONG TERM GOAL #7   Title Patient will present with improved right knee AROM <10 deg knee ext from zero for decreased limp and leg length discrepancy to aide in walking and weight shifting.    Baseline 10/28/2021= 15 deg from zero (right knee ext) ; 7/20: 3 deg from zero    Time 12    Period Weeks    Status Achieved    Target Date 01/20/22      PT LONG TERM GOAL #8   Title Patient will demo modified Ind floor to sit transfer using chair for improved ability to get up off the floor for decreased caregiver or EMS Support.    Baseline 10/28/2021= Not formally assessed yet patient reports difficulty and desire to practice to be able to perform on her own.; 7/20: CGA and min VC/demo to complete floor to stand transfer, use of chair for UE support   Time 12    Period Weeks    Status New    Target Date 01/20/22              Plan     Clinical Impression Statement Patient presents with good motivation to continue to focus on LE strengthening to prepare for floor to stand transfers and improve mobility. She presents with improving overall strength as seen by increased resistance and reps. She performed well with addition of UE strengthening and will benefit from further training. The pt will continue to benefit from skilled PT to improve her strength, balance, and mobility.   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, B THA x2, diastolic heart failure, back pain, tachycardia    Examination-Activity Limitations Bend;Reach Overhead;Stairs;Transfers;Lift;Squat;Locomotion Level;Carry;Stand;Dressing    Examination-Participation Restrictions Volunteer;Community Activity;Shop;Cleaning;Laundry;Yard Work    [employer]chant [employer] officer Evolving/Moderate complexity    Rehab Potential Good    PT Frequency 2x / week    PT Duration 12 weeks    PT Treatment/Interventions ADLs/Self Care Home Management;Aquatic Therapy;Biofeedback;Canalith Repostioning;Cryotherapy;Electrical Stimulation;Moist Heat;Traction;Ultrasound;DME Instruction;Gait training;Functional Biochemist, clinical;Therapeutic activities;Therapeutic exercise;Balance training;Neuromuscular re-education;Patient/family education;Orthotic Fit/Training;Wheelchair mobility training;Manual techniques;Passive range of motion;Scar mobilization;Dry needling;Energy conservation;Splinting;Taping;Vestibular;Visual/perceptual remediation/compensation;Joint Manipulations    PT Next Visit Plan strengthening, gait, balance, ROM as indicated, continue POC       Consulted and Agree with Plan of Care Patient           Ollen Bowl, PT

## 2022-01-04 NOTE — Therapy (Unsigned)
OUTPATIENT PHYSICAL THERAPY TREATMENT NOTE   Patient Name: Mary [name] MRN: [id number] DOB:1941/01/15, 81 y.o., female Today's Date: 01/05/2022  PCP: Valerie Roys, DO REFERRING PROVIDER: Minna Merritts, MD   PT End of Session - 01/05/22 1245     Visit Number 32    Number of Visits 61    Date for PT Re-Evaluation 01/20/22    Authorization Type Medicare Primary, A[employer] Secondary    Authorization Time Period 08/04/21-10/27/21; 10/28/2021- 01/20/2022    Progress Note Due on Visit 40    PT Start Time 1103    PT Stop Time 1145    PT Time Calculation (min) 42 min    Equipment Utilized During Treatment Gait belt    Activity Tolerance Patient tolerated treatment well;No increased pain    Behavior During Therapy WFL for tasks assessed/performed                 Past Medical History:  Diagnosis Date   Arrhythmia    Back pain    Depression with anxiety    Hip pain    Hyperlipidemia    Hypertension    Neuromuscular disorder (HCC)    Paroxysmal tachycardia ([place])    Past Surgical History:  Procedure Laterality Date   ABDOMINAL HYSTERECTOMY     BREAST BIOPSY     BREAST EXCISIONAL BIOPSY Left 2002   benign   CARDIAC CATHETERIZATION     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   x2   TOTAL HIP ARTHROPLASTY Bilateral    done twice   Patient Active Problem List   Diagnosis Date Noted   Insomnia 12/16/2021   Depression, recurrent ([place]) 32/99/2426   Diastolic heart failure ([place]) 07/30/2021   Hyperlipidemia 07/30/2021   Lymphedema 07/30/2021   Gait instability 06/02/2021   Primary hypertension 06/02/2021    REFERRING DIAG: R26.81 (ICD-10-CM) - Gait instability  THERAPY DIAG:  Unsteadiness on feet  Other abnormalities of gait and mobility  Muscle weakness (generalized)  Difficulty in walking, not elsewhere classified  Rationale for Evaluation and Treatment Rehabilitation  PERTINENT HISTORY: Pt is a pleasant 81 y/o female referred to PT for gait instability. Pt ambulates with  RW. She reports onset of gait instability started several years ago. 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 Pt has had PT in past following surgeries. Pt reports hx of frequent falls. She has had 2 falls in past 6 months. Pt believes falls are due to difficulty picking up L foot and dragging L foot. Pt reports no injury with falls.  Pt being seen by OT for lymphedema, reports LLE more affected than RLE. Pt currently lives alone. Her spouse passed away recently in 06/25/2022. Her adult son and DIL live nearby and can assist her. Pt denies any dizziness or lightheadedness. Pt reports no pain currently. 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   PRECAUTIONS: N/A  SUBJECTIVE: Pt reports se has been doing well. She has been leaving cane in car and using walker in the house.   PAIN:  Are you having pain? No    TODAY'S TREATMENT  12/30/2021 - gait belt donned and CGA provided throughout session unless otherwise noted  Ambulation with SPC ( hugo cane), not obtaining full knee extension on the R LE, cane in R UE ( pt reports increased weakness with L > R)  -  x 160 feet, no LOB, unequal step length, good sequencing with cane.   - Balance course 2 x 1/2 bolsters and 4 cones weaving and turning.   -cues for proper cane sequencing for improved safety    - Step tap with 2.5 lb. AW Alt LE  x 10 reps each  UE support   - Lateral Step tap with 2.5 lb BLE x 12 reps    -Ue support    Standing heel raises 12x x 2 sets with 2.5# AW   UE support   Mini-squats 12x. Min VC/demo for technique  UE support     On airex pad    - NBOS x 30 seconds , easy   - semi-tandem stance x 60 seconds each side.    PATIENT EDUCATION: Education details: exercise technique, goals, testing and indications, plan Person educated: Patient Education method: Explanation,  Demonstration, and Verbal cues Education comprehension: verbalized understanding, returned demonstration, verbal cues required, and needs further education   HOME EXERCISE PROGRAM:  no updates as of 12/17/21 08/04/2021: Access Code: [email or web address] 09/28/2021 Access Code: [email or web address] 10/28/2021 Access Code: [email or web address]     PT Short Term Goals      PT SHORT TERM GOAL #1   Title Pt will be independent with HEP in order to improve strength and balance in order to decrease fall risk and improve function at home and work.    Baseline 3/7: provided pt with initial HEP 4/5; compliant    Time 6    Period Weeks    Status Achieved    Target Date 09/15/21              PT Long Term Goals       PT LONG TERM GOAL #1   Title Patient will increase FOTO score to equal to or greater than 52 to demonstrate statistically significant improvement in mobility and quality of life.    Baseline 3/7: 50 4/5: 55%; 10/28/2021=53%    Time 12    Period Weeks    Status Achieved    Target Date 10/27/21      PT LONG TERM GOAL #2   Title Pt will improve BERG by at least 3 points in order to demonstrate clinically significant improvement in balance.    Baseline 3/7: 40/56 4/5: 44/56    Time 12    Period Weeks    Status Achieved    Target Date 10/27/21      PT LONG TERM GOAL #3   Title Pt will decrease 5TSTS by at least 3 seconds in order to demonstrate clinically significant improvement in LE strength.    Baseline 3/7: 15.36 sec with use of BUEs to assist 4/5: 9.6 seconds SUE support    Time 12    Period Weeks    Status Achieved    Target Date 10/27/21      PT LONG TERM GOAL #4   Title Pt will improve ABC by at least 13% in order to demonstrate clinically significant improvement in balance confidence.    Baseline 3/7: 50.63% 4/5: 62% 5/31= 63%; 7/20: 70.31%   Time 12    Period Weeks    Status Achieved   Target Date 01/20/22      PT LONG TERM GOAL #5   Title Patient will increase 10 meter walk test to >1.59ms  as to improve gait speed for better community ambulation and to reduce fall risk.    Baseline 3/7: 0.75 m/s with 4WW 4/5: 0.96 m/s with 4 WW;  10/28/2021= 0.97 m/s with 4WW; 7/20: 0.96 m/s with 4WW   Time 12    Period Weeks    Status Partially Met    Target Date 01/20/22      Additional Long Term Goals   Additional Long Term Goals Yes      PT LONG TERM GOAL #6   Title Patient will demonstrate improved balance by exhibiting ability to single leg stance > 3 sec consistently without UE support for improved dynamic balance.    Baseline 10/28/2021= 1 sec each LE prior to requiring UE support 7/20: 3 sec RLE, <1 sec LLE.   Time 12    Period Weeks    Status On-going    Target Date 01/20/22      PT LONG TERM GOAL #7   Title Patient will present with improved right knee AROM <10 deg knee ext from zero for decreased limp and leg length discrepancy to aide in walking and weight shifting.    Baseline 10/28/2021= 15 deg from zero (right knee ext) ; 7/20: 3 deg from zero    Time 12    Period Weeks    Status Achieved    Target Date 01/20/22      PT LONG TERM GOAL #8   Title Patient will demo modified Ind floor to sit transfer using chair for improved ability to get up off the floor for decreased caregiver or EMS Support.    Baseline 10/28/2021= Not formally assessed yet patient reports difficulty and desire to practice to be able to perform on her own.; 7/20: CGA and min VC/demo to complete floor to stand transfer, use of chair for UE support   Time 12    Period Weeks    Status New    Target Date 01/20/22              Plan     Clinical Impression Statement Patient presents with good motivation for completion of PT activities. Pt progresses with ambulation with cane with balance course with good efficacy following cues. Pt also continues to progress with strengthening activities and activities to improved her balance for improved independent ambulation. Pt will continue to benefit from skilled  physical therapy intervention to address impairments, improve QOL, and attain therapy goals.     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, B THA x2, diastolic heart failure, back pain, tachycardia    Examination-Activity Limitations Bend;Reach Overhead;Stairs;Transfers;Lift;Squat;Locomotion Level;Carry;Stand;Dressing    Examination-Participation Restrictions Volunteer;Community Activity;Shop;Cleaning;Laundry;Yard Work    [employer] officer Evolving/Moderate complexity    Rehab Potential Good    PT Frequency 2x / week    PT Duration 12 weeks    PT Treatment/Interventions ADLs/Self Care Home Management;Aquatic Therapy;Biofeedback;Canalith Repostioning;Cryotherapy;Electrical Stimulation;Moist Heat;Traction;Ultrasound;DME Instruction;Gait training;Functional Biochemist, clinical;Therapeutic activities;Therapeutic exercise;Balance training;Neuromuscular re-education;Patient/family education;Orthotic Fit/Training;Wheelchair mobility training;Manual techniques;Passive range of motion;Scar mobilization;Dry needling;Energy conservation;Splinting;Taping;Vestibular;Visual/perceptual remediation/compensation;Joint Manipulations    PT Next Visit Plan strengthening, gait, balance, ROM as indicated, continue POC       Consulted and Agree with Plan of Care Patient           Ollen Bowl, PT

## 2022-01-05 ENCOUNTER — Ambulatory Visit: Payer: Medicare Other | Admitting: Physical Therapy

## 2022-01-05 DIAGNOSIS — R2681 Unsteadiness on feet: Secondary | ICD-10-CM | POA: Diagnosis not present

## 2022-01-05 DIAGNOSIS — M6281 Muscle weakness (generalized): Secondary | ICD-10-CM

## 2022-01-05 DIAGNOSIS — R2689 Other abnormalities of gait and mobility: Secondary | ICD-10-CM

## 2022-01-05 DIAGNOSIS — R262 Difficulty in walking, not elsewhere classified: Secondary | ICD-10-CM

## 2022-01-06 ENCOUNTER — Ambulatory Visit: Payer: Medicare Other | Admitting: Physical Therapy

## 2022-01-06 ENCOUNTER — Encounter: Payer: Medicare Other | Admitting: Occupational Therapy

## 2022-01-07 ENCOUNTER — Ambulatory Visit: Payer: Medicare Other

## 2022-01-07 ENCOUNTER — Encounter: Payer: Self-pay | Admitting: Physical Therapy

## 2022-01-07 DIAGNOSIS — R2681 Unsteadiness on feet: Secondary | ICD-10-CM

## 2022-01-07 DIAGNOSIS — R269 Unspecified abnormalities of gait and mobility: Secondary | ICD-10-CM

## 2022-01-07 DIAGNOSIS — R262 Difficulty in walking, not elsewhere classified: Secondary | ICD-10-CM

## 2022-01-07 DIAGNOSIS — M6281 Muscle weakness (generalized): Secondary | ICD-10-CM

## 2022-01-07 DIAGNOSIS — R2689 Other abnormalities of gait and mobility: Secondary | ICD-10-CM

## 2022-01-07 NOTE — Therapy (Signed)
OUTPATIENT PHYSICAL THERAPY TREATMENT NOTE   Patient Name: Mary [name] MRN: [id number] DOB:March 01, 1941, 81 y.o., female Today's Date: 01/07/2022  PCP: Valerie Roys, DO REFERRING PROVIDER: Minna Merritts, MD   PT End of Session - 01/07/22 1205     Visit Number 33    Number of Visits 24    Date for PT Re-Evaluation 01/20/22    Authorization Type Medicare Primary, AARP Secondary    Authorization Time Period 08/04/21-10/27/21; 10/28/2021- 01/20/2022    Progress Note Due on Visit 40    PT Start Time 1155    PT Stop Time 1239    PT Time Calculation (min) 44 min    Equipment Utilized During Treatment Gait belt    Activity Tolerance Patient tolerated treatment well;No increased pain    Behavior During Therapy WFL for tasks assessed/performed                 Past Medical History:  Diagnosis Date   Arrhythmia    Back pain    Depression with anxiety    Hip pain    Hyperlipidemia    Hypertension    Neuromuscular disorder (HCC)    Paroxysmal tachycardia ([place])    Past Surgical History:  Procedure Laterality Date   ABDOMINAL HYSTERECTOMY     BREAST BIOPSY     BREAST EXCISIONAL BIOPSY Left 2002   benign   CARDIAC CATHETERIZATION     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   x2   TOTAL HIP ARTHROPLASTY Bilateral    done twice   Patient Active Problem List   Diagnosis Date Noted   Insomnia 12/16/2021   Depression, recurrent ([place]) 93/81/0175   Diastolic heart failure ([place]) 07/30/2021   Hyperlipidemia 07/30/2021   Lymphedema 07/30/2021   Gait instability 06/02/2021   Primary hypertension 06/02/2021    REFERRING DIAG: R26.81 (ICD-10-CM) - Gait instability  THERAPY DIAG:  Unsteadiness on feet  Other abnormalities of gait and mobility  Muscle weakness (generalized)  Difficulty in walking, not elsewhere classified  Abnormality of gait and mobility  Rationale for Evaluation and Treatment Rehabilitation  PERTINENT HISTORY: Pt is a pleasant 81 y/o female referred to PT for  gait instability. Pt ambulates with RW. She reports onset of gait instability started several years ago. 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 Pt has had PT in past following surgeries. Pt reports hx of frequent falls. She has had 2 falls in past 6 months. Pt believes falls are due to difficulty picking up L foot and dragging L foot. Pt reports no injury with falls.  Pt being seen by OT for lymphedema, reports LLE more affected than RLE. Pt currently lives alone. Her spouse passed away recently in June 28, 2022. Her adult son and DIL live nearby and can assist her. Pt denies any dizziness or lightheadedness. Pt reports no pain currently. 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   PRECAUTIONS: N/A  SUBJECTIVE: Pt reports she is doing well. No falls or LOB noted. Really wanting to get to ambulate with [facility] and not have to rely on the RW.  PAIN:  Are you having pain? No    TODAY'S TREATMENT  01/07/2022 - gait belt donned and CGA provided throughout session unless otherwise noted  There.ex:   86x160' with hugo cane. Noted limited R knee extension during stance phase, step through gait.  Decreased step length on L with R lateral lean ot R during R sided stance phase.   Standing exercise: 2x12/exercise/LE with BUE's supported on balance bar. Frequent multimodal cuing for form/technique primarily with hip exercises due to weakness.   Toe raises    Alt marches    Hip abduction. Very poor eccentric control with RLE and limited AROM with concentric contraction compared to LLE.     // bars:   Lateral side steps x8 laps for outside hip strengthening with light BUE support  Alternating 4" step ups with contralateral UE support: 2x12/LE. Poor eccentric control on BLE's.       PATIENT EDUCATION: Education details: exercise technique, goals, testing and  indications, plan Person educated: Patient Education method: Explanation, Demonstration, and Verbal cues Education comprehension: verbalized understanding, returned demonstration, verbal cues required, and needs further education   HOME EXERCISE PROGRAM:  no updates as of 12/17/21 08/04/2021: Access Code: [email or web address] 09/28/2021 Access Code: [email or web address] 10/28/2021 Access Code: [email or web address]     PT Short Term Goals      PT SHORT TERM GOAL #1   Title Pt will be independent with HEP in order to improve strength and balance in order to decrease fall risk and improve function at home and work.    Baseline 3/7: provided pt with initial HEP 4/5; compliant    Time 6    Period Weeks    Status Achieved    Target Date 09/15/21              PT Long Term Goals       PT LONG TERM GOAL #1   Title Patient will increase FOTO score to equal to or greater than 52 to demonstrate statistically significant improvement in mobility and quality of life.    Baseline 3/7: 50 4/5: 55%; 10/28/2021=53%    Time 12    Period Weeks    Status Achieved    Target Date 10/27/21      PT LONG TERM GOAL #2   Title Pt will improve BERG by at least 3 points in order to demonstrate clinically significant improvement in balance.    Baseline 3/7: 40/56 4/5: 44/56    Time 12    Period Weeks    Status Achieved    Target Date 10/27/21      PT LONG TERM GOAL #3   Title Pt will decrease 5TSTS by at least 3 seconds in order to demonstrate clinically significant improvement in LE strength.    Baseline 3/7: 15.36 sec with use of BUEs to assist 4/5: 9.6 seconds SUE support    Time 12    Period Weeks    Status Achieved    Target Date 10/27/21      PT LONG TERM GOAL #4   Title Pt will improve ABC by at least 13% in order to demonstrate clinically significant improvement in balance confidence.    Baseline 3/7: 50.63% 4/5: 62% 5/31= 63%; 7/20: 70.31%   Time 12    Period Weeks    Status Achieved   Target Date 01/20/22      PT  LONG TERM GOAL #5   Title Patient will increase 10 meter walk test to >1.66ms as to improve gait speed for better community ambulation and to reduce fall risk.    Baseline 3/7: 0.75 m/s with 4WW 4/5: 0.96 m/s with 4 WW; 10/28/2021= 0.97 m/s with 40QQ 7/20: 0.96 m/s with 4WW   Time 12    Period Weeks    Status  Partially Met    Target Date 01/20/22      Additional Long Term Goals   Additional Long Term Goals Yes      PT LONG TERM GOAL #6   Title Patient will demonstrate improved balance by exhibiting ability to single leg stance > 3 sec consistently without UE support for improved dynamic balance.    Baseline 10/28/2021= 1 sec each LE prior to requiring UE support 7/20: 3 sec RLE, <1 sec LLE.   Time 12    Period Weeks    Status On-going    Target Date 01/20/22      PT LONG TERM GOAL #7   Title Patient will present with improved right knee AROM <10 deg knee ext from zero for decreased limp and leg length discrepancy to aide in walking and weight shifting.    Baseline 10/28/2021= 15 deg from zero (right knee ext) ; 7/20: 3 deg from zero    Time 12    Period Weeks    Status Achieved    Target Date 01/20/22      PT LONG TERM GOAL #8   Title Patient will demo modified Ind floor to sit transfer using chair for improved ability to get up off the floor for decreased caregiver or EMS Support.    Baseline 10/28/2021= Not formally assessed yet patient reports difficulty and desire to practice to be able to perform on her own.; 7/20: CGA and min VC/demo to complete floor to stand transfer, use of chair for UE support   Time 12    Period Weeks    Status New    Target Date 01/20/22              Plan     Clinical Impression Statement Continuing PT POC but with primary focus on strengthening today due to noted gait deficits during walking bouts. Pt demonstrating weakness in ankle DF, lateral and posterior hip musculature leading to significant R sided truncal lean on RLE stance phase with step  to pattern due to poor hip extension in toe off and L sided trendelenburg with L sided stance phase. Mod multimodal cues provided for LE exercise due to weakness and truncal compensations. Pt remains highly motivated with treatment. Will continue to benefit from skilled PT services to improve strength, gait, and balance with LRAD to reduce risk of falls and optimize independence with functional mobility.   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, B THA x2, diastolic heart failure, back pain, tachycardia    Examination-Activity Limitations Bend;Reach Overhead;Stairs;Transfers;Lift;Squat;Locomotion Level;Carry;Stand;Dressing    Examination-Participation Restrictions Volunteer;Community Activity;Shop;Cleaning;Laundry;Yard Work    [employer]chant [employer] officer Evolving/Moderate complexity    Rehab Potential Good    PT Frequency 2x / week    PT Duration 12 weeks    PT Treatment/Interventions ADLs/Self Care Home Management;Aquatic Therapy;Biofeedback;Canalith Repostioning;Cryotherapy;Electrical Stimulation;Moist Heat;Traction;Ultrasound;DME Instruction;Gait training;Functional Biochemist, clinical;Therapeutic activities;Therapeutic exercise;Balance training;Neuromuscular re-education;Patient/family education;Orthotic Fit/Training;Wheelchair mobility training;Manual techniques;Passive range of motion;Scar mobilization;Dry needling;Energy conservation;Splinting;Taping;Vestibular;Visual/perceptual remediation/compensation;Joint Manipulations    PT Next Visit Plan strengthening, gait, balance, ROM as indicated, continue POC       Consulted and Agree with Plan of Care Patient           Salem Caster. Fairly IV, PT, DPT Physical Therapist- [facility]

## 2022-01-08 ENCOUNTER — Other Ambulatory Visit: Payer: Self-pay | Admitting: Family Medicine

## 2022-01-08 ENCOUNTER — Ambulatory Visit: Payer: Medicare Other | Admitting: Physical Therapy

## 2022-01-08 NOTE — Telephone Encounter (Signed)
Change in therapy 12/16/21 Requested Prescriptions  Pending Prescriptions Disp Refills  . buPROPion (WELLBUTRIN SR) 150 MG 12 hr tablet [Pharmacy Med Name: BUPROPION SR '150MG'$  TABLETS (12 H)] 60 tablet 2    Sig: TAKE 1 TABLET(150 MG) BY MOUTH TWICE DAILY     Psychiatry: Antidepressants - bupropion Passed - 01/08/2022  8:00 AM      Passed - Cr in normal range and within 360 days    Creatinine, Ser  Date Value Ref Range Status  12/16/2021 0.94 0.57 - 1.00 mg/dL Final         Passed - AST in normal range and within 360 days    AST  Date Value Ref Range Status  12/16/2021 20 0 - 40 IU/L Final         Passed - ALT in normal range and within 360 days    ALT  Date Value Ref Range Status  12/16/2021 14 0 - 32 IU/L Final         Passed - Completed PHQ-2 or PHQ-9 in the last 360 days      Passed - Last BP in normal range    BP Readings from Last 1 Encounters:  12/16/21 (!) 101/58         Passed - Valid encounter within last 6 months    Recent Outpatient Visits          3 weeks ago Depression, recurrent ([place])   [place], Megan P, DO   5 months ago Diastolic heart failure, unspecified HF chronicity ([place])   [facility] Vigg, Avanti, MD   5 months ago [place], Santiago Glad, NP   6 months ago Acute pain of left knee   [facility] Vigg, Avanti, MD   7 months ago Acute diastolic heart failure [facility])   [place], Avanti, MD      Future Appointments            In 1 week Wynetta Emery, Barb Merino, DO [place], [place]   In 3 months Ralene Bathe, MD [place]

## 2022-01-11 ENCOUNTER — Ambulatory Visit: Payer: Medicare Other

## 2022-01-11 DIAGNOSIS — R2681 Unsteadiness on feet: Secondary | ICD-10-CM | POA: Diagnosis not present

## 2022-01-11 DIAGNOSIS — M6281 Muscle weakness (generalized): Secondary | ICD-10-CM

## 2022-01-11 DIAGNOSIS — R269 Unspecified abnormalities of gait and mobility: Secondary | ICD-10-CM

## 2022-01-11 DIAGNOSIS — R2689 Other abnormalities of gait and mobility: Secondary | ICD-10-CM

## 2022-01-11 DIAGNOSIS — R278 Other lack of coordination: Secondary | ICD-10-CM

## 2022-01-11 DIAGNOSIS — I890 Lymphedema, not elsewhere classified: Secondary | ICD-10-CM

## 2022-01-11 DIAGNOSIS — R262 Difficulty in walking, not elsewhere classified: Secondary | ICD-10-CM

## 2022-01-11 NOTE — Therapy (Signed)
OUTPATIENT PHYSICAL THERAPY TREATMENT NOTE   Patient Name: Mary [name] MRN: [id number] DOB:05/07/1941, 81 y.o., female Today's Date: 01/11/2022  PCP: Valerie Roys, DO REFERRING PROVIDER: Minna Merritts, MD         Past Medical History:  Diagnosis Date   Arrhythmia    Back pain    Depression with anxiety    Hip pain    Hyperlipidemia    Hypertension    Neuromuscular disorder ([place])    Paroxysmal tachycardia ([place])    Past Surgical History:  Procedure Laterality Date   ABDOMINAL HYSTERECTOMY     BREAST BIOPSY     BREAST EXCISIONAL BIOPSY Left 2002   benign   CARDIAC CATHETERIZATION     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   x2   TOTAL HIP ARTHROPLASTY Bilateral    done twice   Patient Active Problem List   Diagnosis Date Noted   Insomnia 12/16/2021   Depression, recurrent ([place]) 26/37/8588   Diastolic heart failure ([place]) 07/30/2021   Hyperlipidemia 07/30/2021   Lymphedema 07/30/2021   Gait instability 06/02/2021   Primary hypertension 06/02/2021    REFERRING DIAG: R26.81 (ICD-10-CM) - Gait instability  THERAPY DIAG:  No diagnosis found.  Rationale for Evaluation and Treatment Rehabilitation  PERTINENT HISTORY: Pt is a pleasant 81 y/o female referred to PT for gait instability. Pt ambulates with RW. She reports onset of gait instability started several years ago. 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 Pt has had PT in past following surgeries. Pt reports hx of frequent falls. She has had 2 falls in past 6 months. Pt believes falls are due to difficulty picking up L foot and dragging L foot. Pt reports no injury with falls.  Pt being seen by OT for lymphedema, reports LLE more affected than RLE. Pt currently lives alone. Her spouse passed away recently in 07-09-22. Her adult son and DIL live nearby and can assist her. Pt denies any dizziness or lightheadedness. Pt reports no pain  currently. 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   PRECAUTIONS: N/A  SUBJECTIVE: Pt reports feeling well today, without pain. Notes mild soreness after last session that was manageable.  PAIN:  Are you having pain? No    TODAY'S TREATMENT  01/11/2022 - gait belt donned and SBA-CGA provided throughout session unless otherwise noted  There.ex:   1x160', 1x28f with hugo cane; limited knee ext and heel strike on the right; VC's for knee ext and heel strike.  STS 2x8 with BUE support; emphasis on eccentric control  // bars:  2x8: Hip abduction. Poor eccentric control with limited hip ext. TC at hips for reduced pelvic rotation  x10ea: Pre-gait activities; split stance with heel-toe rocking, emphasis on knee ext   Standing toe taps to 'hedgehogs' on airex pad, 2x182m each side; emphasis on proprioceptive feedback from mirror  4" step ups with contralateral UE support: x10ea with emphasis on eccentric quad control    Manual Therapy:  Hip mobilization multidirection, G3-4 x8m81mest/retest with hip ABD)   PATIENT EDUCATION: Education details: exercise technique, goals, testing and indications, plan Person educated: Patient Education method: Explanation, Demonstration, and Verbal cues Education comprehension: verbalized understanding, returned demonstration, verbal cues required, and needs further education   HOME EXERCISE PROGRAM:  no updates as of 12/17/21 08/04/2021: Access Code: [email or web address] 09/28/2021 Access Code: [email or web address]  10/28/2021 Access Code: [email or web address]   8/14/22023 Access Code: [email or web address]   PT Short Term Goals      PT SHORT TERM GOAL #1   Title Pt will be independent with HEP in order to improve strength and balance in order to decrease fall risk and improve function at home and work.    Baseline 3/7: provided pt with initial HEP 4/5; compliant    Time 6    Period Weeks    Status Achieved    Target Date  09/15/21              PT Long Term Goals       PT LONG TERM GOAL #1   Title Patient will increase FOTO score to equal to or greater than 52 to demonstrate statistically significant improvement in mobility and quality of life.    Baseline 3/7: 50 4/5: 55%; 10/28/2021=53%    Time 12    Period Weeks    Status Achieved    Target Date 10/27/21      PT LONG TERM GOAL #2   Title Pt will improve BERG by at least 3 points in order to demonstrate clinically significant improvement in balance.    Baseline 3/7: 40/56 4/5: 44/56    Time 12    Period Weeks    Status Achieved    Target Date 10/27/21      PT LONG TERM GOAL #3   Title Pt will decrease 5TSTS by at least 3 seconds in order to demonstrate clinically significant improvement in LE strength.    Baseline 3/7: 15.36 sec with use of BUEs to assist 4/5: 9.6 seconds SUE support    Time 12    Period Weeks    Status Achieved    Target Date 10/27/21      PT LONG TERM GOAL #4   Title Pt will improve ABC by at least 13% in order to demonstrate clinically significant improvement in balance confidence.    Baseline 3/7: 50.63% 4/5: 62% 5/31= 63%; 7/20: 70.31%   Time 12    Period Weeks    Status Achieved   Target Date 01/20/22      PT LONG TERM GOAL #5   Title Patient will increase 10 meter walk test to >1.92ms as to improve gait speed for better community ambulation and to reduce fall risk.    Baseline 3/7: 0.75 m/s with 4WW 4/5: 0.96 m/s with 4 WW; 10/28/2021= 0.97 m/s with 41YN 7/20: 0.96 m/s with 4WW   Time 12    Period Weeks    Status Partially Met    Target Date 01/20/22      Additional Long Term Goals   Additional Long Term Goals Yes      PT LONG TERM GOAL #6   Title Patient will demonstrate improved balance by exhibiting ability to single leg stance > 3 sec consistently without UE support for improved dynamic balance.    Baseline 10/28/2021= 1 sec each LE prior to requiring UE support 7/20: 3 sec RLE, <1 sec LLE.   Time 12     Period Weeks    Status On-going    Target Date 01/20/22      PT LONG TERM GOAL #7   Title Patient will present with improved right knee AROM <10 deg knee ext from zero for decreased limp and leg length discrepancy to aide in walking and weight shifting.    Baseline 10/28/2021= 15 deg from zero (right knee ext) ; 7/20: 3 deg from  zero    Time 12    Period Weeks    Status Achieved    Target Date 01/20/22      PT LONG TERM GOAL #8   Title Patient will demo modified Ind floor to sit transfer using chair for improved ability to get up off the floor for decreased caregiver or EMS Support.    Baseline 10/28/2021= Not formally assessed yet patient reports difficulty and desire to practice to be able to perform on her own.; 7/20: CGA and min VC/demo to complete floor to stand transfer, use of chair for UE support   Time 12    Period Weeks    Status New    Target Date 01/20/22              Plan     Clinical Impression Statement Pt tolerated treatment well today. Treatment continues to focus on LE strengthening, as well as balance and gait training with Joesphine Bare. Pt continues to present with gait deficits significant for: limited R knee ext, decreased R heel strike, increased trendelenburg, decreased bil hip ext, and increased pelvic rotation. Gait deficits increase pt's risk of falls without use of 4ww. Mild limitations in L hip joint ROM; addressed via mobilizations. Pt able to demonstrate minimal improvements in active hip ROM with standing hip ABD intervention s/p passive hip accessories. Minimal improvements noted with gait training after pre-gait interventions as pt states "she thinks about it too much." Questionable leg-length discrepancy vs. Pelvic malalignment as pt presents with L pelvic hiking in static standing with L knee flexion. Pt will continue to benefit from skilled OPPT services to address LE strength and balance deficits for improved safety and functional mobility.     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, B THA x2, diastolic heart failure, back pain, tachycardia    Examination-Activity Limitations Bend;Reach Overhead;Stairs;Transfers;Lift;Squat;Locomotion Level;Carry;Stand;Dressing    Examination-Participation Restrictions Volunteer;Community Activity;Shop;Cleaning;Laundry;Yard Work    [employer]chant [employer] officer Evolving/Moderate complexity    Rehab Potential Good    PT Frequency 2x / week    PT Duration 12 weeks    PT Treatment/Interventions ADLs/Self Care Home Management;Aquatic Therapy;Biofeedback;Canalith Repostioning;Cryotherapy;Electrical Stimulation;Moist Heat;Traction;Ultrasound;DME Instruction;Gait training;Functional Biochemist, clinical;Therapeutic activities;Therapeutic exercise;Balance training;Neuromuscular re-education;Patient/family education;Orthotic Fit/Training;Wheelchair mobility training;Manual techniques;Passive range of motion;Scar mobilization;Dry needling;Energy conservation;Splinting;Taping;Vestibular;Visual/perceptual remediation/compensation;Joint Manipulations    PT Next Visit Plan Incorporating dance into balance interventions, break down gait tasks for training, LE strengthening, dynamic balance activities       Consulted and Agree with Plan of Care Patient              Herminio Commons, PT, DPT 4:25 PM,01/11/22

## 2022-01-13 ENCOUNTER — Encounter: Payer: Medicare Other | Admitting: Occupational Therapy

## 2022-01-13 ENCOUNTER — Ambulatory Visit: Payer: Medicare Other | Admitting: Physical Therapy

## 2022-01-13 ENCOUNTER — Encounter: Payer: Self-pay | Admitting: Physical Therapy

## 2022-01-13 ENCOUNTER — Ambulatory Visit
Admission: RE | Admit: 2022-01-13 | Discharge: 2022-01-13 | Disposition: A | Discharge status: Home / Self Care | Payer: Medicare Other | Source: Ambulatory Visit | Attending: Family Medicine | Admitting: Family Medicine

## 2022-01-13 DIAGNOSIS — M8580 Other specified disorders of bone density and structure, unspecified site: Secondary | ICD-10-CM | POA: Diagnosis not present

## 2022-01-13 DIAGNOSIS — Z96643 Presence of artificial hip joint, bilateral: Secondary | ICD-10-CM | POA: Insufficient documentation

## 2022-01-13 DIAGNOSIS — R921 Mammographic calcification found on diagnostic imaging of breast: Secondary | ICD-10-CM | POA: Insufficient documentation

## 2022-01-13 DIAGNOSIS — Z780 Asymptomatic menopausal state: Secondary | ICD-10-CM | POA: Diagnosis present

## 2022-01-13 DIAGNOSIS — Z96653 Presence of artificial knee joint, bilateral: Secondary | ICD-10-CM | POA: Diagnosis not present

## 2022-01-13 DIAGNOSIS — R2681 Unsteadiness on feet: Secondary | ICD-10-CM

## 2022-01-13 DIAGNOSIS — M6281 Muscle weakness (generalized): Secondary | ICD-10-CM

## 2022-01-13 DIAGNOSIS — R2689 Other abnormalities of gait and mobility: Secondary | ICD-10-CM

## 2022-01-13 DIAGNOSIS — R269 Unspecified abnormalities of gait and mobility: Secondary | ICD-10-CM

## 2022-01-13 NOTE — Therapy (Signed)
OUTPATIENT PHYSICAL THERAPY TREATMENT NOTE   Patient Name: Mary [name] MRN: [id number] DOB:September 03, 1940, 81 y.o., female Today's Date: 01/13/2022  PCP: Valerie Roys, DO REFERRING PROVIDER: Minna Merritts, MD   PT End of Session - 01/13/22 1147     Visit Number 35    Number of Visits 82    Date for PT Re-Evaluation 01/20/22    Authorization Type Medicare Primary, [employer] Secondary    Authorization Time Period 08/04/21-10/27/21; 10/28/2021- 01/20/2022    Progress Note Due on Visit 40    PT Start Time 1146    PT Stop Time 1228    PT Time Calculation (min) 42 min    Equipment Utilized During Treatment Gait belt    Activity Tolerance Patient tolerated treatment well;No increased pain    Behavior During Therapy WFL for tasks assessed/performed                  Past Medical History:  Diagnosis Date   Arrhythmia    Back pain    Depression with anxiety    Hip pain    Hyperlipidemia    Hypertension    Neuromuscular disorder ([facility])    Paroxysmal tachycardia ([place])    Past Surgical History:  Procedure Laterality Date   ABDOMINAL HYSTERECTOMY     BREAST BIOPSY     BREAST EXCISIONAL BIOPSY Left 2002   benign   CARDIAC CATHETERIZATION     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   x2   TOTAL HIP ARTHROPLASTY Bilateral    done twice   Patient Active Problem List   Diagnosis Date Noted   Insomnia 12/16/2021   Depression, recurrent ([place]) 83/38/2505   Diastolic heart failure ([place]) 07/30/2021   Hyperlipidemia 07/30/2021   Lymphedema 07/30/2021   Gait instability 06/02/2021   Primary hypertension 06/02/2021    REFERRING DIAG: R26.81 (ICD-10-CM) - Gait instability  THERAPY DIAG:  Unsteadiness on feet  Other abnormalities of gait and mobility  Muscle weakness (generalized)  Abnormality of gait and mobility  Rationale for Evaluation and Treatment Rehabilitation  PERTINENT HISTORY: Pt is a pleasant 81 y/o female referred to PT for gait instability. Pt ambulates with RW. She  reports onset of gait instability started several years ago. 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 Pt has had PT in past following surgeries. Pt reports hx of frequent falls. She has had 2 falls in past 6 months. Pt believes falls are due to difficulty picking up L foot and dragging L foot. Pt reports no injury with falls.  Pt being seen by OT for lymphedema, reports LLE more affected than RLE. Pt currently lives alone. Her spouse passed away recently in Jul 14, 2022. Her adult son and DIL live nearby and can assist her. Pt denies any dizziness or lightheadedness. Pt reports no pain currently. 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   PRECAUTIONS: N/A  SUBJECTIVE: Pt reports feeling well today with minimal soreenss that has resolved since last session.   PAIN:  Are you having pain? No    TODAY'S TREATMENT  01/13/22  - gait belt donned and SBA-CGA provided throughout session unless otherwise noted  There.ex:  x10ea: Pre-gait activities; split stance with heel-toe rocking, emphasis on knee ext  1x160', 1x74f with hugo cane; limited knee ext and heel strike on the right; VC's for knee ext and heel  strike.  Squats at support bar, cues for hip hinge on eccentric portion and cues for glute contraction for full erect posture   // bars:  X 12 R Hip abduction mixed with extension with object for external focus for porper form, with cues improved eccentric control, UE use for stability     Seated clam GTB x 20   TA:  Practiced floor to stand transfers as well as educated patient regarding safe techniques for this.  Patient utilize technique where she holds onto supportive surface like a chair but will use mat table in session to walk her legs back and slowly lower her knees to the ground into a comfortable position.  Patient then performs 10  press ups trying to activate her glutes and hamstrings for erect posture.  Patient requires minimal assist to prevent loss of balance anteriorly with this task.  This may indicate continued gluteal and hamstring weakness.  Patient then proceeds to stand utilizing take where she walks lower extremities up with min assist from therapist to prevent loss of balance and then patient sits prior to coming to full standing erect posture.  Completed this 2 bouts       PATIENT EDUCATION: Education details: exercise technique, goals, testing and indications, plan Person educated: Patient Education method: Explanation, Demonstration, and Verbal cues Education comprehension: verbalized understanding, returned demonstration, verbal cues required, and needs further education   HOME EXERCISE PROGRAM:  no updates as of 12/17/21 08/04/2021: Access Code: [email or web address] 09/28/2021 Access Code: [email or web address] 10/28/2021 Access Code: [email or web address]   8/14/22023 Access Code: [email or web address]   PT Short Term Goals      PT SHORT TERM GOAL #1   Title Pt will be independent with HEP in order to improve strength and balance in order to decrease fall risk and improve function at home and work.    Baseline 3/7: provided pt with initial HEP 4/5; compliant    Time 6    Period Weeks    Status Achieved    Target Date 09/15/21              PT Long Term Goals       PT LONG TERM GOAL #1   Title Patient will increase FOTO score to equal to or greater than 52 to demonstrate statistically significant improvement in mobility and quality of life.    Baseline 3/7: 50 4/5: 55%; 10/28/2021=53%    Time 12    Period Weeks    Status Achieved    Target Date 10/27/21      PT LONG TERM GOAL #2   Title Pt will improve BERG by at least 3 points in order to demonstrate clinically significant improvement in balance.    Baseline 3/7: 40/56 4/5: 44/56    Time 12    Period Weeks    Status Achieved    Target Date 10/27/21      PT LONG TERM GOAL #3    Title Pt will decrease 5TSTS by at least 3 seconds in order to demonstrate clinically significant improvement in LE strength.    Baseline 3/7: 15.36 sec with use of BUEs to assist 4/5: 9.6 seconds SUE support    Time 12    Period Weeks    Status Achieved    Target Date 10/27/21      PT LONG TERM GOAL #4   Title Pt will improve ABC by at least 13% in order to demonstrate clinically significant improvement in balance confidence.    Baseline 3/7: 50.63% 4/5:  62% 5/31= 63%; 7/20: 70.31%   Time 12    Period Weeks    Status Achieved   Target Date 01/20/22      PT LONG TERM GOAL #5   Title Patient will increase 10 meter walk test to >1.53ms as to improve gait speed for better community ambulation and to reduce fall risk.    Baseline 3/7: 0.75 m/s with 4WW 4/5: 0.96 m/s with 4 WW; 10/28/2021= 0.97 m/s with 47YP 7/20: 0.96 m/s with 4WW   Time 12    Period Weeks    Status Partially Met    Target Date 01/20/22      Additional Long Term Goals   Additional Long Term Goals Yes      PT LONG TERM GOAL #6   Title Patient will demonstrate improved balance by exhibiting ability to single leg stance > 3 sec consistently without UE support for improved dynamic balance.    Baseline 10/28/2021= 1 sec each LE prior to requiring UE support 7/20: 3 sec RLE, <1 sec LLE.   Time 12    Period Weeks    Status On-going    Target Date 01/20/22      PT LONG TERM GOAL #7   Title Patient will present with improved right knee AROM <10 deg knee ext from zero for decreased limp and leg length discrepancy to aide in walking and weight shifting.    Baseline 10/28/2021= 15 deg from zero (right knee ext) ; 7/20: 3 deg from zero    Time 12    Period Weeks    Status Achieved    Target Date 01/20/22      PT LONG TERM GOAL #8   Title Patient will demo modified Ind floor to sit transfer using chair for improved ability to get up off the floor for decreased caregiver or EMS Support.    Baseline 10/28/2021= Not formally  assessed yet patient reports difficulty and desire to practice to be able to perform on her own.; 7/20: CGA and min VC/demo to complete floor to stand transfer, use of chair for UE support   Time 12    Period Weeks    Status New    Target Date 01/20/22              Plan     Clinical Impression Statement Continued with current plan of care as laid out in evaluation and recent prior sessions. Pt remains motivated to advance progress toward goals in order to maximize independence and safety at home. Pt requires high level assistance and cuing for completion of exercises in order to provide adequate level of stimulation challenge while minimizing pain and discomfort when possible.  Patient practiced stand to floor and 4 to stand transfers this session.  Patient had difficulty was able to complete safely with min to contact-guard assist from PT for safety.  Patient will continue to benefit from improving glutes extension strength as well as hamstring strength and hip mobility and strengthening activities to improve this task.  Pt continues to demonstrate progress toward goals AEB progression of interventions this date either in volume or intensity. Pt will continue to benefit from skilled physical therapy intervention to address impairments, improve QOL, and attain therapy goals.       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, B THA x2, diastolic heart failure, back pain, tachycardia    Examination-Activity Limitations Bend;Reach Overhead;Stairs;Transfers;Lift;Squat;Locomotion Level;Carry;Stand;Dressing  Examination-Participation Restrictions Volunteer;Community Activity;Shop;Cleaning;Laundry;Yard Work    Merchant [employer] officer Evolving/Moderate complexity    Rehab Potential Good    PT Frequency 2x / week    PT Duration 12 weeks    PT  Treatment/Interventions ADLs/Self Care Home Management;Aquatic Therapy;Biofeedback;Canalith Repostioning;Cryotherapy;Electrical Stimulation;Moist Heat;Traction;Ultrasound;DME Instruction;Gait training;Functional Biochemist, clinical;Therapeutic activities;Therapeutic exercise;Balance training;Neuromuscular re-education;Patient/family education;Orthotic Fit/Training;Wheelchair mobility training;Manual techniques;Passive range of motion;Scar mobilization;Dry needling;Energy conservation;Splinting;Taping;Vestibular;Visual/perceptual remediation/compensation;Joint Manipulations    PT Next Visit Plan Incorporating dance into balance interventions, break down gait tasks for training, LE strengthening, dynamic balance activities       Consulted and Agree with Plan of Care Patient              Particia Lather PT  12:39 PM,01/13/22

## 2022-01-15 ENCOUNTER — Encounter: Payer: Self-pay | Admitting: Family Medicine

## 2022-01-15 ENCOUNTER — Ambulatory Visit (INDEPENDENT_AMBULATORY_CARE_PROVIDER_SITE_OTHER): Payer: Medicare Other | Admitting: Family Medicine

## 2022-01-15 VITALS — BP 105/63 | HR 74 | Temp 98.5°F | Wt 179.8 lb

## 2022-01-15 DIAGNOSIS — I952 Hypotension due to drugs: Secondary | ICD-10-CM | POA: Diagnosis not present

## 2022-01-15 DIAGNOSIS — R0683 Snoring: Secondary | ICD-10-CM

## 2022-01-15 MED ORDER — METOPROLOL SUCCINATE ER 25 MG PO TB24: 12.5000 mg | ORAL_TABLET | Freq: Every day | ORAL | 1 refills | Status: DC | PRN

## 2022-01-15 NOTE — Progress Notes (Addendum)
BP 105/63   Pulse 74   Temp 98.5 F (36.9 C)   Wt 179 lb 12.8 oz (81.6 kg)   SpO2 97%   BMI 30.86 kg/m    Subjective:    Patient ID: Mary [name], female    DOB: [birth year], 81 y.o.   MRN: [id number]  HPI: Mary [name] is a 81 y.o. female  Chief Complaint  Patient presents with   Hypertension   Snoring    Patient states she would like to have a sleep study done, states she snores and sleeps with her mouth open. Has never had a sleep study done.   HYPOTENSION Hypertension status: better  Satisfied with current treatment? yes Duration of hypertension: chronic BP medication side effects:  yes- dizziness Medication compliance: excellent compliance Previous BP meds:metoprolol, lasix Aspirin: no Recurrent headaches: no Visual changes: no Palpitations: no Dyspnea: no Chest pain: no Lower extremity edema: no Dizzy/lightheaded: yes  ???SLEEP APNEA Sleep apnea status: unknown Duration: chronic- snoring for years Satisfied with current treatment?:  N/A CPAP use:  no Last sleep study: never Treatments attempted: none Wakes feeling refreshed:  no Daytime hypersomnolence:  yes Fatigue:  yes Insomnia:  no Good sleep hygiene:  yes Difficulty falling asleep:  no Difficulty staying asleep:  no Snoring bothers bed partner:  yes Observed apnea by bed partner: yes Obesity:  yes Hypertension: yes  Pulmonary hypertension:  no Coronary artery disease:  no  Relevant past medical, surgical, family and social history reviewed and updated as indicated. Interim medical history since our last visit reviewed. Allergies and medications reviewed and updated.  Review of Systems  Constitutional: Negative.   Respiratory: Negative.    Cardiovascular: Negative.   Gastrointestinal: Negative.   Musculoskeletal: Negative.   Neurological: Negative.   Psychiatric/Behavioral: Negative.      Per HPI unless specifically indicated above     Objective:    BP 105/63   Pulse 74   Temp  98.5 F (36.9 C)   Wt 179 lb 12.8 oz (81.6 kg)   SpO2 97%   BMI 30.86 kg/m   Wt Readings from Last 3 Encounters:  01/15/22 179 lb 12.8 oz (81.6 kg)  12/16/21 179 lb 12.8 oz (81.6 kg)  11/03/21 182 lb 3.2 oz (82.6 kg)    Physical Exam Vitals and nursing note reviewed.  Constitutional:      General: She is not in acute distress.    Appearance: Normal appearance. She is not ill-appearing, toxic-appearing or diaphoretic.  HENT:     Head: Normocephalic and atraumatic.     Right Ear: External ear normal.     Left Ear: External ear normal.     Nose: Nose normal.     Mouth/Throat:     Mouth: Mucous membranes are moist.     Pharynx: Oropharynx is clear.  Eyes:     General: No scleral icterus.       Right eye: No discharge.        Left eye: No discharge.     Extraocular Movements: Extraocular movements intact.     Conjunctiva/sclera: Conjunctivae normal.     Pupils: Pupils are equal, round, and reactive to light.  Cardiovascular:     Rate and Rhythm: Normal rate and regular rhythm.     Pulses: Normal pulses.     Heart sounds: Normal heart sounds. No murmur heard.    No friction rub. No gallop.  Pulmonary:     Effort: Pulmonary effort is normal. No respiratory distress.  Breath sounds: Normal breath sounds. No stridor. No wheezing, rhonchi or rales.  Chest:     Chest wall: No tenderness.  Musculoskeletal:        General: Normal range of motion.     Cervical back: Normal range of motion and neck supple.  Skin:    General: Skin is warm and dry.     Capillary Refill: Capillary refill takes less than 2 seconds.     Coloration: Skin is not jaundiced or pale.     Findings: No bruising, erythema, lesion or rash.  Neurological:     General: No focal deficit present.     Mental Status: She is alert and oriented to person, place, and time. Mental status is at baseline.  Psychiatric:        Mood and Affect: Mood normal.        Behavior: Behavior normal.        Thought Content:  Thought content normal.        Judgment: Judgment normal.     Results for orders placed or performed in visit on 12/16/21  Urine Culture   Specimen: Urine   UR  Result Value Ref Range   Urine Culture, Routine Final report    Organism ID, Bacteria Comment   Urinalysis, Routine w reflex microscopic  Result Value Ref Range   Specific Gravity, UA >1.030 (H) 1.005 - 1.030   pH, UA 5.5 5.0 - 7.5   Color, UA Yellow Yellow   Appearance Ur Clear Clear   Leukocytes,UA Negative Negative   Protein,UA Negative Negative/Trace   Glucose, UA Negative Negative   Ketones, UA Negative Negative   RBC, UA Negative Negative   Bilirubin, UA Negative Negative   Urobilinogen, Ur 0.2 0.2 - 1.0 mg/dL   Nitrite, UA Negative Negative  Comprehensive metabolic panel  Result Value Ref Range   Glucose 147 (H) 70 - 99 mg/dL   BUN 17 8 - 27 mg/dL   Creatinine, Ser 0.94 0.57 - 1.00 mg/dL   eGFR 62 >59 mL/min/1.73   BUN/Creatinine Ratio 18 12 - 28   Sodium 142 134 - 144 mmol/L   Potassium 4.7 3.5 - 5.2 mmol/L   Chloride 103 96 - 106 mmol/L   CO2 24 20 - 29 mmol/L   Calcium 9.7 8.7 - 10.3 mg/dL   Total Protein 7.0 6.0 - 8.5 g/dL   Albumin 4.6 3.8 - 4.8 g/dL   Globulin, Total 2.4 1.5 - 4.5 g/dL   Albumin/Globulin Ratio 1.9 1.2 - 2.2   Bilirubin Total 0.6 0.0 - 1.2 mg/dL   Alkaline Phosphatase 114 44 - 121 IU/L   AST 20 0 - 40 IU/L   ALT 14 0 - 32 IU/L  CBC with Differential/Platelet  Result Value Ref Range   WBC 4.6 3.4 - 10.8 x10E3/uL   RBC 4.36 3.77 - 5.28 x10E6/uL   Hemoglobin 13.3 11.1 - 15.9 g/dL   Hematocrit 41.5 34.0 - 46.6 %   MCV 95 79 - 97 fL   MCH 30.5 26.6 - 33.0 pg   MCHC 32.0 31.5 - 35.7 g/dL   RDW 13.1 11.7 - 15.4 %   Platelets 214 150 - 450 x10E3/uL   Neutrophils 64 Not Estab. %   Lymphs 28 Not Estab. %   Monocytes 6 Not Estab. %   Eos 1 Not Estab. %   Basos 1 Not Estab. %   Neutrophils Absolute 3.0 1.4 - 7.0 x10E3/uL   Lymphocytes Absolute 1.3 0.7 - 3.1 x10E3/uL  Monocytes  Absolute 0.3 0.1 - 0.9 x10E3/uL   EOS (ABSOLUTE) 0.0 0.0 - 0.4 x10E3/uL   Basophils Absolute 0.0 0.0 - 0.2 x10E3/uL   Immature Granulocytes 0 Not Estab. %   Immature Grans (Abs) 0.0 0.0 - 0.1 x10E3/uL  Lipid Panel w/o Chol/HDL Ratio  Result Value Ref Range   Cholesterol, Total 170 100 - 199 mg/dL   Triglycerides 88 0 - 149 mg/dL   HDL 76 >39 mg/dL   VLDL Cholesterol Cal 16 5 - 40 mg/dL   LDL Chol Calc (NIH) 78 0 - 99 mg/dL  Microalbumin, Urine Waived  Result Value Ref Range   Microalb, Ur Waived 10 0 - 19 mg/L   Creatinine, Urine Waived 100 10 - 300 mg/dL   Microalb/Creat Ratio <30 <30 mg/g  TSH  Result Value Ref Range   TSH 1.490 0.450 - 4.500 uIU/mL  Hepatitis C Antibody  Result Value Ref Range   Hep C Virus Ab Non Reactive Non Reactive      Assessment & Plan:   Problem List Items Addressed This Visit   None Visit Diagnoses     Snoring    -  Primary   Will set up for sleep study. Await results. Treat as needed.    Relevant Orders   Ambulatory referral to Sleep Studies   Hypotension due to drugs       Better. Less dizzy. Still running low. Will make her metoprolol PRN. Call with any concerns.    Relevant Medications   metoprolol succinate (TOPROL-XL) 25 MG 24 hr tablet        Follow up plan: Return in about 5 months (around 06/17/2022).

## 2022-01-20 ENCOUNTER — Encounter: Payer: Medicare Other | Admitting: Occupational Therapy

## 2022-01-20 ENCOUNTER — Ambulatory Visit: Payer: Medicare Other

## 2022-01-20 DIAGNOSIS — R278 Other lack of coordination: Secondary | ICD-10-CM

## 2022-01-20 DIAGNOSIS — R2681 Unsteadiness on feet: Secondary | ICD-10-CM

## 2022-01-20 DIAGNOSIS — R2689 Other abnormalities of gait and mobility: Secondary | ICD-10-CM

## 2022-01-20 DIAGNOSIS — R262 Difficulty in walking, not elsewhere classified: Secondary | ICD-10-CM

## 2022-01-20 DIAGNOSIS — R269 Unspecified abnormalities of gait and mobility: Secondary | ICD-10-CM

## 2022-01-20 DIAGNOSIS — M6281 Muscle weakness (generalized): Secondary | ICD-10-CM

## 2022-01-20 NOTE — Therapy (Addendum)
OUTPATIENT PHYSICAL THERAPY TREATMENT NOTE   Patient Name: Mary [name] MRN: [id number] DOB:09-06-1940, 81 y.o., female Today's Date: 01/20/2022  PCP: Valerie Roys, DO REFERRING PROVIDER: Minna Merritts, MD   PT End of Session - 01/20/22 1559     Visit Number 36    Number of Visits 3    Date for PT Re-Evaluation 04/14/22    Authorization Type Medicare Primary, [employer] Secondary    Authorization Time Period 01/20/22-04/14/22   Progress Note Due on Visit 40    PT Start Time 1105    PT Stop Time 1145    PT Time Calculation (min) 40 min    Equipment Utilized During Treatment Gait belt    Activity Tolerance Patient tolerated treatment well;No increased pain    Behavior During Therapy WFL for tasks assessed/performed                     REFERRING DIAG: R26.81 (ICD-10-CM) - Gait instability  THERAPY DIAG:  Unsteadiness on feet  Other abnormalities of gait and mobility  Muscle weakness (generalized)  Abnormality of gait and mobility  Difficulty in walking, not elsewhere classified  Other lack of coordination  Rationale for Evaluation and Treatment Rehabilitation  PERTINENT HISTORY: Pt is a pleasant 81 y/o female referred to PT for gait instability. Pt ambulates with RW. She reports onset of gait instability started several years ago. 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 Pt has had PT in past following surgeries. Pt reports hx of frequent falls. She has had 2 falls in past 6 months. Pt believes falls are due to difficulty picking up L foot and dragging L foot. Pt reports no injury with falls.  Pt being seen by OT for lymphedema, reports LLE more affected than RLE. Pt currently lives alone. Her spouse passed away recently in 2022/06/14. Her adult son and DIL live nearby and can assist her. Pt denies any dizziness or lightheadedness. Pt reports no pain currently. Per chart PMH  significant: HTN, BTKA x1 R and 2xL, B THA x2, diastolic heart failure, back pain, tachycardia.   PRECAUTIONS: N/A  SUBJECTIVE: Pt reports feeling well today with minimal soreenss that has resolved since last session.   PAIN:  Are you having pain? No    TODAY'S TREATMENT 01/20/22 -FOTO survey -discussion of overall progress and review of pt's personal goals -FOTO survey -BBT, improvements with continued difficulty regarding frontal plane hip/trunk control -posterio stepping without device x27f, minGuard assist to improve A/P postural control concerns         PATIENT EDUCATION: Education details: exercise technique, goals, testing and indications, plan Person educated: Patient Education method: Explanation, Demonstration, and Verbal cues Education comprehension: verbalized understanding, returned demonstration, verbal cues required, and needs further education   HOME EXERCISE PROGRAM:  no updates as of 12/17/21 08/04/2021: Access Code: [email or web address] 09/28/2021 Access Code: [email or web address]/31/2023 Access Code: [email or web address]  8/14/22023 Access Code: [email or web address]  PT Short Term Goals      PT SHORT TERM GOAL #1   Title Pt will be independent with HEP in order to improve strength and balance in order to decrease fall risk and improve function at home and work.    Baseline 3/7: provided pt with initial HEP 4/5; compliant    Time 6    Period Weeks    Status Achieved    Target Date 09/15/21  PT Long Term Goals       PT LONG TERM GOAL #1   Title Patient will increase FOTO score to equal to or greater than 62 to demonstrate statistically significant improvement in mobility and quality of life.    Baseline 3/7: 50 4/5: 55%; 10/28/2021=53%; 8/23: 58   Time 12    Period Weeks    Status Revised    Target Date 04/14/22      PT LONG TERM GOAL #2   Title Pt will improve BERG to >49 in order to demonstrate clinically significant improvement in balance.    Baseline 3/7: 40/56 4/5:  44/56; 01/20/22: 47   Time 12    Period Weeks    Status Revised    Target Date 04/14/22       PT LONG TERM GOAL #3   Title Patient will increase 10 meter walk test to >1.21ms as to improve gait speed for better community ambulation and to reduce fall risk.    Baseline 3/7: 0.75 m/s with 4WW 4/5: 0.96 m/s with 4 WW; 10/28/2021= 0.97 m/s with 46GE 7/20: 0.96 m/s with 4WW   Time 12    Period Weeks    Status Partially Met    Target Date 04/14/22        PT LONG TERM GOAL #4   Title Patient will demonstrate improved balance by exhibiting ability to bowler stance (wide tandem) bilat without UE support for >60sec to demonstrate improved frontal plane postural control.    Baseline 01/20/22: 30sec bilat, partail hand support to perform on Rt side only    Time 12    Period Weeks    Status Revised    Target Date 04/14/22       PT LONG TERM GOAL #5   Title Patient will demo modified Ind floor to sit transfer using chair for improved ability to get up off the floor for decreased caregiver or EMS Support.    Baseline 10/28/2021= Not formally assessed yet patient reports difficulty and desire to practice to be able to perform on her own.; 7/20: CGA and min VC/demo to complete floor to stand transfer, use of chair for UE support   Time 12    Period Weeks    Status Ongoing     Target Date 04/14/22                Plan     Clinical Impression Statement Re certification this date as well as reassessment, pt continues to demonstrate improvements in FOTO score, Berg Balance Test, and subjective report in daily life at home. Pt still feels limited compared to where she would like to be upon DC, still desires more independent and safe walking. BBT demonstrates continued severe limitations in single limb loading scenarios, limited ability to perform righting due to weakness throughout both hips and trunk. Pt remains motivated to advance progress toward goals in order to maximize independence and safety at  home. Pt continues to demonstrate progress toward goals AEB progression of interventions this date either in volume or intensity. Pt will continue to benefit from skilled physical therapy intervention to address impairments, improve QOL, and attain therapy goals.    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, B THA x2, diastolic heart failure, back pain, tachycardia    Examination-Activity Limitations Bend;Reach Overhead;Stairs;Transfers;Lift;Squat;Locomotion Level;Carry;Stand;Dressing    Examination-Participation Restrictions Volunteer;Community Activity;Shop;Cleaning;Laundry;Yard Work    SMerchant navy officerEvolving/Moderate complexity  Rehab Potential Good    PT Frequency 2x / week    PT Duration 12 weeks    PT Treatment/Interventions ADLs/Self Care Home Management;Aquatic Therapy;Biofeedback;Canalith Repostioning;Cryotherapy;Electrical Stimulation;Moist Heat;Traction;Ultrasound;DME Instruction;Gait training;Functional Biochemist, clinical;Therapeutic activities;Therapeutic exercise;Balance training;Neuromuscular re-education;Patient/family education;Orthotic Fit/Training;Wheelchair mobility training;Manual techniques;Passive range of motion;Scar mobilization;Dry needling;Energy conservation;Splinting;Taping;Vestibular;Visual/perceptual remediation/compensation;Joint Manipulations    PT Next Visit Plan Heavy front plane emphasis on hip strategy and ABDCT strength in functional context       Consulted and Agree with Plan of Care Patient            4:04 PM, 01/20/22 Etta Grandchild, PT, DPT Physical Therapist - [facility]  [phone] (ASCOM)    Etta Grandchild PT  4:00 PM,01/20/22

## 2022-01-22 ENCOUNTER — Ambulatory Visit: Payer: Medicare Other | Admitting: Physical Therapy

## 2022-01-26 ENCOUNTER — Encounter: Payer: Self-pay | Admitting: Physical Therapy

## 2022-01-26 ENCOUNTER — Ambulatory Visit: Payer: Medicare Other | Admitting: Physical Therapy

## 2022-01-26 DIAGNOSIS — R2681 Unsteadiness on feet: Secondary | ICD-10-CM | POA: Diagnosis not present

## 2022-01-26 DIAGNOSIS — R2689 Other abnormalities of gait and mobility: Secondary | ICD-10-CM

## 2022-01-26 DIAGNOSIS — M6281 Muscle weakness (generalized): Secondary | ICD-10-CM

## 2022-01-26 DIAGNOSIS — R269 Unspecified abnormalities of gait and mobility: Secondary | ICD-10-CM

## 2022-01-26 NOTE — Therapy (Signed)
OUTPATIENT PHYSICAL THERAPY TREATMENT NOTE   Patient Name: Mary [name] MRN: [id number] DOB:11-14-1940, 81 y.o., female Today's Date: 01/26/2022  PCP: Valerie Roys, DO REFERRING PROVIDER: Minna Merritts, MD   PT End of Session - 01/26/22 1106     Visit Number 37    Number of Visits 65    Date for PT Re-Evaluation 03/17/22    Authorization Type Medicare Primary, [employer] Secondary    Authorization Time Period 01/20/22-03/17/22    Progress Note Due on Visit 40    PT Start Time 1103    PT Stop Time 1143    PT Time Calculation (min) 40 min    Equipment Utilized During Treatment Gait belt    Activity Tolerance Patient tolerated treatment well;No increased pain    Behavior During Therapy WFL for tasks assessed/performed                   Past Medical History:  Diagnosis Date   Arrhythmia    Back pain    Depression with anxiety    Hip pain    Hyperlipidemia    Hypertension    Neuromuscular disorder (HCC)    Paroxysmal tachycardia ([place])    Past Surgical History:  Procedure Laterality Date   ABDOMINAL HYSTERECTOMY     BREAST BIOPSY     BREAST EXCISIONAL BIOPSY Left 2002   benign   CARDIAC CATHETERIZATION     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   x2   TOTAL HIP ARTHROPLASTY Bilateral    done twice   Patient Active Problem List   Diagnosis Date Noted   Insomnia 12/16/2021   Depression, recurrent ([place]) 11/88/6773   Diastolic heart failure ([place]) 07/30/2021   Hyperlipidemia 07/30/2021   Lymphedema 07/30/2021   Gait instability 06/02/2021   Primary hypertension 06/02/2021    REFERRING DIAG: R26.81 (ICD-10-CM) - Gait instability  THERAPY DIAG:  Unsteadiness on feet  Other abnormalities of gait and mobility  Muscle weakness (generalized)  Abnormality of gait and mobility  Rationale for Evaluation and Treatment Rehabilitation  PERTINENT HISTORY: Pt is a pleasant 81 y/o female referred to PT for gait instability. Pt ambulates with RW. She reports onset of gait  instability started several years ago. 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 Pt has had PT in past following surgeries. Pt reports hx of frequent falls. She has had 2 falls in past 6 months. Pt believes falls are due to difficulty picking up L foot and dragging L foot. Pt reports no injury with falls.  Pt being seen by OT for lymphedema, reports LLE more affected than RLE. Pt currently lives alone. Her spouse passed away recently in 07-21-22. Her adult son and DIL live nearby and can assist her. Pt denies any dizziness or lightheadedness. Pt reports no pain currently. 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   PRECAUTIONS: N/A  SUBJECTIVE: Pt reports no significant changes since last session.  PAIN:  Are you having pain? No    TODAY'S TREATMENT 01/26/22 Exercise/Activity Sets/Reps/Time/ Resistance Assistance Charge type Comments  Supine:  PWR! Step  Hip abduction on slider  Bridge  Clamshell    X 10 ea LE 2*10 2*10 *10 GTB 5 sec holds  Min A to amintain positioning TE Cues for proper hip alignment throughout. Increased difficulty with L LE hip abduciton and motion generally.  STS band around knees  x 10 RTB,  X 10 GTB  Min use of hands, CGA  TE RTB easy increased to GTB with increased challenge.   Sidestepping with band around knees   X 12 ea side RTB around knees  CGA  TE Small steps but able to keep hips in proper alignment without cues.   Treatment Provided this session   Rationale for Evaluation and Treatment Rehabilitation  Pt educated throughout session about proper posture and technique with exercises. Improved exercise technique, movement at target joints, use of target muscles after min to mod verbal, visual, tactile cues. Note: Portions of this document were prepared using Dragon voice recognition software and although  reviewed may contain unintentional dictation errors in syntax, grammar, or spelling.         PATIENT EDUCATION: Education details: exercise technique, goals, testing and indications, plan Person educated: Patient Education method: Explanation, Demonstration, and Verbal cues Education comprehension: verbalized understanding, returned demonstration, verbal cues required, and needs further education   HOME EXERCISE PROGRAM:  no updates as of 12/17/21 08/04/2021: Access Code: [email or web address] 09/28/2021 Access Code: [email or web address] 10/28/2021 Access Code: [email or web address]   8/14/22023 Access Code: [email or web address]   PT Short Term Goals      PT SHORT TERM GOAL #1   Title Pt will be independent with HEP in order to improve strength and balance in order to decrease fall risk and improve function at home and work.    Baseline 3/7: provided pt with initial HEP 4/5; compliant    Time 6    Period Weeks    Status Achieved    Target Date 09/15/21              PT Long Term Goals       PT LONG TERM GOAL #1   Title Patient will increase FOTO score to equal to or greater than 62 to demonstrate statistically significant improvement in mobility and quality of life.    Baseline 3/7: 50 4/5: 55%; 10/28/2021=53%; 8/23: 58   Time 12    Period Weeks    Status Revise    Target Date 03/17/22      PT LONG TERM GOAL #2   Title Pt will improve BERG to >49 in order to demonstrate clinically significant improvement in balance.    Baseline 3/7: 40/56 4/5: 44/56; 01/20/22: 47   Time 12    Period Weeks    Status Revise    Target Date 03/17/22      PT LONG TERM GOAL #3   Title Pt will decrease 5TSTS by at least 3 seconds in order to demonstrate clinically significant improvement in LE strength.    Baseline 3/7: 15.36 sec with use of BUEs to assist 4/5: 9.6 seconds SUE support    Time 12    Period Weeks    Status Achieved    Target Date 03/17/22      PT LONG TERM GOAL #4   Title Pt will improve ABC by at least 13% in order to  demonstrate clinically significant improvement in balance confidence.    Baseline 3/7: 50.63% 4/5: 62% 5/31= 63%; 7/20: 70.31%   Time 12    Period Weeks    Status Achieved   Target Date 03/17/22      PT LONG TERM GOAL #5   Title Patient will increase 10 meter walk test to >1.8ms as to improve gait speed for better community ambulation and to reduce fall risk.    Baseline 3/7: 0.75 m/s  with 4WW 4/5: 0.96 m/s with 4 WW; 10/28/2021= 0.97 m/s with 0NU; 7/20: 0.96 m/s with 4WW   Time 12    Period Weeks    Status Partially Met    Target Date 03/17/22        Additional Long Term Goals   Additional Long Term Goals Yes      PT LONG TERM GOAL #6   Title Patient will demonstrate improved balance by exhibiting ability to bowler stance (wide tandem) bilat without UE support for >60sec to demonstrate improved frontal plane postural control.    Baseline 01/20/22: 30sec bilat, partail hand support to perform on Rt side only    Time 12    Period Weeks    Status Revised    Target Date 03/17/22        PT LONG TERM GOAL #7   Title Patient will present with improved right knee AROM <10 deg knee ext from zero for decreased limp and leg length discrepancy to aide in walking and weight shifting.    Baseline 10/28/2021= 15 deg from zero (right knee ext) ; 7/20: 3 deg from zero    Time 12    Period Weeks    Status Achieved    Target Date 03/17/22       PT LONG TERM GOAL #8   Title Patient will demo modified Ind floor to sit transfer using chair for improved ability to get up off the floor for decreased caregiver or EMS Support.    Baseline 10/28/2021= Not formally assessed yet patient reports difficulty and desire to practice to be able to perform on her own.; 7/20: CGA and min VC/demo to complete floor to stand transfer, use of chair for UE support   Time 12    Period Weeks    Status Ongoing     Target Date 03/17/22             Plan     Clinical Impression Statement Continued with current  plan of care as laid out in evaluation and recent prior sessions. Pt remains motivated to advance progress toward goals in order to maximize independence and safety at home. Pt requires high level assistance and cuing for completion of exercises in order to provide adequate level of stimulation challenge while minimizing pain and discomfort when possible. Treatment focussed on on hip strength motion and stability particularly for abduction. Pt closely monitored throughout session pt response and to maximize patient safety during interventions. Pt continues to demonstrate progress toward goals AEB progression of interventions this date either in volume or intensity.    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, B THA x2, diastolic heart failure, back pain, tachycardia    Examination-Activity Limitations Bend;Reach Overhead;Stairs;Transfers;Lift;Squat;Locomotion Level;Carry;Stand;Dressing    Examination-Participation Restrictions Volunteer;Community Activity;Shop;Cleaning;Laundry;Yard Work    [employer]chant [employer] officer Evolving/Moderate complexity    Rehab Potential Good    PT Frequency 2x / week    PT Duration 12 weeks    PT Treatment/Interventions ADLs/Self Care Home Management;Aquatic Therapy;Biofeedback;Canalith Repostioning;Cryotherapy;Electrical Stimulation;Moist Heat;Traction;Ultrasound;DME Instruction;Gait training;Functional Biochemist, clinical;Therapeutic activities;Therapeutic exercise;Balance training;Neuromuscular re-education;Patient/family education;Orthotic Fit/Training;Wheelchair mobility training;Manual techniques;Passive range of motion;Scar mobilization;Dry needling;Energy conservation;Splinting;Taping;Vestibular;Visual/perceptual remediation/compensation;Joint Manipulations    PT Next Visit Plan Heavy front plane emphasis on hip  strategy and ABDCT strength in functional context       Consulted and Agree with Plan of Care Patient  12:57 PM, 01/26/22   Particia Lather PT  12:57 PM,01/26/22

## 2022-01-28 ENCOUNTER — Encounter: Payer: Medicare Other | Admitting: Occupational Therapy

## 2022-02-02 ENCOUNTER — Encounter: Payer: Medicare Other | Admitting: Occupational Therapy

## 2022-02-02 ENCOUNTER — Ambulatory Visit: Payer: Medicare Other | Attending: Cardiovascular Disease | Admitting: Physical Therapy

## 2022-02-02 DIAGNOSIS — R2681 Unsteadiness on feet: Secondary | ICD-10-CM | POA: Diagnosis present

## 2022-02-02 DIAGNOSIS — M6281 Muscle weakness (generalized): Secondary | ICD-10-CM | POA: Diagnosis present

## 2022-02-02 DIAGNOSIS — R2689 Other abnormalities of gait and mobility: Secondary | ICD-10-CM | POA: Diagnosis present

## 2022-02-02 DIAGNOSIS — R269 Unspecified abnormalities of gait and mobility: Secondary | ICD-10-CM | POA: Diagnosis present

## 2022-02-02 DIAGNOSIS — R262 Difficulty in walking, not elsewhere classified: Secondary | ICD-10-CM | POA: Insufficient documentation

## 2022-02-02 NOTE — Therapy (Signed)
OUTPATIENT PHYSICAL THERAPY TREATMENT NOTE   Patient Name: Mary [name] MRN: [id number] DOB:1941/02/25, 81 y.o., female Today's Date: 02/02/2022  PCP: Valerie Roys, DO REFERRING PROVIDER: Minna Merritts, MD   PT End of Session - 02/02/22 1105     Visit Number 38    Number of Visits 16    Date for PT Re-Evaluation 03/17/22    Authorization Type Medicare Primary, [employer] Secondary    Authorization Time Period 01/20/22-03/17/22    Progress Note Due on Visit 40    PT Start Time 1105    PT Stop Time 1145    PT Time Calculation (min) 40 min    Equipment Utilized During Treatment Gait belt    Activity Tolerance Patient tolerated treatment well;No increased pain    Behavior During Therapy WFL for tasks assessed/performed                   Past Medical History:  Diagnosis Date   Arrhythmia    Back pain    Depression with anxiety    Hip pain    Hyperlipidemia    Hypertension    Neuromuscular disorder (HCC)    Paroxysmal tachycardia ([place])    Past Surgical History:  Procedure Laterality Date   ABDOMINAL HYSTERECTOMY     BREAST BIOPSY     BREAST EXCISIONAL BIOPSY Left 2002   benign   CARDIAC CATHETERIZATION     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   x2   TOTAL HIP ARTHROPLASTY Bilateral    done twice   Patient Active Problem List   Diagnosis Date Noted   Insomnia 12/16/2021   Depression, recurrent ([place]) 81/85/6314   Diastolic heart failure ([place]) 07/30/2021   Hyperlipidemia 07/30/2021   Lymphedema 07/30/2021   Gait instability 06/02/2021   Primary hypertension 06/02/2021    REFERRING DIAG: R26.81 (ICD-10-CM) - Gait instability  THERAPY DIAG:  Unsteadiness on feet  Other abnormalities of gait and mobility  Muscle weakness (generalized)  Abnormality of gait and mobility  Rationale for Evaluation and Treatment Rehabilitation  PERTINENT HISTORY: Pt is a pleasant 81 y/o female referred to PT for gait instability. Pt ambulates with RW. She reports onset of gait  instability started several years ago. 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 Pt has had PT in past following surgeries. Pt reports hx of frequent falls. She has had 2 falls in past 6 months. Pt believes falls are due to difficulty picking up L foot and dragging L foot. Pt reports no injury with falls.  Pt being seen by OT for lymphedema, reports LLE more affected than RLE. Pt currently lives alone. Her spouse passed away recently in 07/03/22. Her adult son and DIL live nearby and can assist her. Pt denies any dizziness or lightheadedness. Pt reports no pain currently. 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   PRECAUTIONS: N/A  SUBJECTIVE: Pt reports no significant changes since last session. Pt reports she was late due to a detour and she had slight knee pain following last session. Pt reports no knee pain at this time.   PAIN:  Are you having pain? No    TODAY'S TREATMENT 02/02/22 Exercise/Activity Sets/Reps/Time/ Resistance Assistance Charge type Comments  Supine:    Hip abduction on slider  Bridge  Clamshell    10* 5 sec hold LLE only 2*10 with  GTB around knees *15 BTB 5 sec holds  Min A to maintain positioning TE Cues for proper hip alignment throughout. Increased difficulty with L LE hip abduciton and motion generally.  Added reps, resistance to several exercises, some c/o HS cramp with bridginh   STS band around knees   2 Min use of hands, CGA  TE RTB easy increased to GTB with increased challenge.   Side step with 2# AW over 1/2 foam rollers in a line of 3 in // bars  2 sets x 2 time through  times through  Heavy UE assist  TE To target hip abductor muscle activation   Sidestepping with band around knees   2 X 12 ea side RTB around knees  CGA  TE Improved step length, cues for proper form, used mirror for feedback.   Rocker  board laterally  2 x 20 each direction CGA TE   Treatment Provided this session   Rationale for Evaluation and Treatment Rehabilitation  Pt educated throughout session about proper posture and technique with exercises. Improved exercise technique, movement at target joints, use of target muscles after min to mod verbal, visual, tactile cues.  Note: Portions of this document were prepared using Dragon voice recognition software and although reviewed may contain unintentional dictation errors in syntax, grammar, or spelling.         PATIENT EDUCATION: Education details: exercise technique, goals, testing and indications, plan Person educated: Patient Education method: Explanation, Demonstration, and Verbal cues Education comprehension: verbalized understanding, returned demonstration, verbal cues required, and needs further education   HOME EXERCISE PROGRAM:  no updates as of 12/17/21 08/04/2021: Access Code: [email or web address] 09/28/2021 Access Code: [email or web address] 10/28/2021 Access Code: [email or web address]   8/14/22023 Access Code: [email or web address]   PT Short Term Goals      PT SHORT TERM GOAL #1   Title Pt will be independent with HEP in order to improve strength and balance in order to decrease fall risk and improve function at home and work.    Baseline 3/7: provided pt with initial HEP 4/5; compliant    Time 6    Period Weeks    Status Achieved    Target Date 09/15/21              PT Long Term Goals       PT LONG TERM GOAL #1   Title Patient will increase FOTO score to equal to or greater than 62 to demonstrate statistically significant improvement in mobility and quality of life.    Baseline 3/7: 50 4/5: 55%; 10/28/2021=53%; 8/23: 58   Time 12    Period Weeks    Status Revise    Target Date 03/17/22      PT LONG TERM GOAL #2   Title Pt will improve BERG to >49 in order to demonstrate clinically significant improvement in balance.    Baseline 3/7: 40/56 4/5: 44/56; 01/20/22: 47   Time 12     Period Weeks    Status Revise    Target Date 03/17/22      PT LONG TERM GOAL #3   Title Pt will decrease 5TSTS by at least 3 seconds in order to demonstrate clinically significant improvement in LE strength.    Baseline 3/7: 15.36 sec with use of BUEs to assist 4/5: 9.6 seconds SUE support    Time 12    Period Weeks    Status Achieved    Target Date 03/17/22      PT LONG TERM GOAL #4  Title Pt will improve ABC by at least 13% in order to demonstrate clinically significant improvement in balance confidence.    Baseline 3/7: 50.63% 4/5: 62% 5/31= 63%; 7/20: 70.31%   Time 12    Period Weeks    Status Achieved   Target Date 03/17/22      PT LONG TERM GOAL #5   Title Patient will increase 10 meter walk test to >1.38ms as to improve gait speed for better community ambulation and to reduce fall risk.    Baseline 3/7: 0.75 m/s with 4WW 4/5: 0.96 m/s with 4 WW; 10/28/2021= 0.97 m/s with 47LG 7/20: 0.96 m/s with 4WW   Time 12    Period Weeks    Status Partially Met    Target Date 03/17/22        Additional Long Term Goals   Additional Long Term Goals Yes      PT LONG TERM GOAL #6   Title Patient will demonstrate improved balance by exhibiting ability to bowler stance (wide tandem) bilat without UE support for >60sec to demonstrate improved frontal plane postural control.    Baseline 01/20/22: 30sec bilat, partail hand support to perform on Rt side only    Time 12    Period Weeks    Status Revised    Target Date 03/17/22        PT LONG TERM GOAL #7   Title Patient will present with improved right knee AROM <10 deg knee ext from zero for decreased limp and leg length discrepancy to aide in walking and weight shifting.    Baseline 10/28/2021= 15 deg from zero (right knee ext) ; 7/20: 3 deg from zero    Time 12    Period Weeks    Status Achieved    Target Date 03/17/22       PT LONG TERM GOAL #8   Title Patient will demo modified Ind floor to sit transfer using chair for improved  ability to get up off the floor for decreased caregiver or EMS Support.    Baseline 10/28/2021= Not formally assessed yet patient reports difficulty and desire to practice to be able to perform on her own.; 7/20: CGA and min VC/demo to complete floor to stand transfer, use of chair for UE support   Time 12    Period Weeks    Status Ongoing     Target Date 03/17/22             Plan     Clinical Impression Statement Continued with current plan of care as laid out in evaluation and recent prior sessions. Pt remains motivated to advance progress toward goals in order to maximize independence and safety at home. Pt requires high level assistance and cuing for completion of exercises in order to provide adequate level of stimulation challenge while minimizing pain and discomfort when possible. Treatment focussed on on hip strength motion and stability particularly for abduction. Pt closely monitored throughout session pt response and to maximize patient safety during interventions. Pt continues to demonstrate progress toward goals AEB progression of interventions this date either in volume or intensity.    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, B THA x2, diastolic heart failure, back pain, tachycardia    Examination-Activity Limitations Bend;Reach Overhead;Stairs;Transfers;Lift;Squat;Locomotion Level;Carry;Stand;Dressing    Examination-Participation Restrictions Volunteer;Community Activity;Shop;Cleaning;Laundry;Yard Work    SMerchant navy officerEvolving/Moderate complexity    Rehab Potential Good  PT Frequency 2x / week    PT Duration 12 weeks    PT Treatment/Interventions ADLs/Self Care Home Management;Aquatic Therapy;Biofeedback;Canalith Repostioning;Cryotherapy;Electrical Stimulation;Moist Heat;Traction;Ultrasound;DME Instruction;Gait  training;Functional Biochemist, clinical;Therapeutic activities;Therapeutic exercise;Balance training;Neuromuscular re-education;Patient/family education;Orthotic Fit/Training;Wheelchair mobility training;Manual techniques;Passive range of motion;Scar mobilization;Dry needling;Energy conservation;Splinting;Taping;Vestibular;Visual/perceptual remediation/compensation;Joint Manipulations    PT Next Visit Plan Heavy front plane emphasis on hip strategy and ABDCT strength in functional context       Consulted and Agree with Plan of Care Patient            12:41 PM, 02/02/22   Particia Lather PT  12:41 PM,02/02/22

## 2022-02-04 ENCOUNTER — Ambulatory Visit: Payer: Medicare Other | Admitting: Physical Therapy

## 2022-02-08 ENCOUNTER — Encounter: Payer: Medicare Other | Admitting: Occupational Therapy

## 2022-02-08 ENCOUNTER — Ambulatory Visit: Payer: Medicare Other | Admitting: Physical Therapy

## 2022-02-10 ENCOUNTER — Ambulatory Visit: Payer: Medicare Other | Admitting: Physical Therapy

## 2022-02-11 ENCOUNTER — Encounter: Payer: Self-pay | Admitting: Physical Therapy

## 2022-02-11 ENCOUNTER — Ambulatory Visit: Payer: Medicare Other | Admitting: Physical Therapy

## 2022-02-11 DIAGNOSIS — M6281 Muscle weakness (generalized): Secondary | ICD-10-CM

## 2022-02-11 DIAGNOSIS — R269 Unspecified abnormalities of gait and mobility: Secondary | ICD-10-CM

## 2022-02-11 DIAGNOSIS — R2681 Unsteadiness on feet: Secondary | ICD-10-CM | POA: Diagnosis not present

## 2022-02-11 DIAGNOSIS — R2689 Other abnormalities of gait and mobility: Secondary | ICD-10-CM

## 2022-02-11 DIAGNOSIS — R262 Difficulty in walking, not elsewhere classified: Secondary | ICD-10-CM

## 2022-02-11 NOTE — Therapy (Signed)
OUTPATIENT PHYSICAL THERAPY TREATMENT NOTE   Patient Name: Mary [name] MRN: [id number] DOB:[birth year], 81 y.o., female Today's Date: 02/11/2022  PCP: Valerie Roys, DO REFERRING PROVIDER: Minna Merritts, MD   PT End of Session - 02/11/22 1021     Visit Number 39    Number of Visits 69    Date for PT Re-Evaluation 03/17/22    Authorization Type Medicare Primary, [employer] Secondary    Authorization Time Period 01/20/22-03/17/22    Progress Note Due on Visit 40    PT Start Time 1017    PT Stop Time 1100    PT Time Calculation (min) 43 min    Equipment Utilized During Treatment Gait belt    Activity Tolerance Patient tolerated treatment well;No increased pain    Behavior During Therapy WFL for tasks assessed/performed                    Past Medical History:  Diagnosis Date   Arrhythmia    Back pain    Depression with anxiety    Hip pain    Hyperlipidemia    Hypertension    Neuromuscular disorder (HCC)    Paroxysmal tachycardia ([place])    Past Surgical History:  Procedure Laterality Date   ABDOMINAL HYSTERECTOMY     BREAST BIOPSY     BREAST EXCISIONAL BIOPSY Left July 12, 2000   benign   CARDIAC CATHETERIZATION     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   x2   TOTAL HIP ARTHROPLASTY Bilateral    done twice   Patient Active Problem List   Diagnosis Date Noted   Insomnia 12/16/2021   Depression, recurrent ([place]) 95/62/1308   Diastolic heart failure ([place]) 07/30/2021   Hyperlipidemia 07/30/2021   Lymphedema 07/30/2021   Gait instability 06/02/2021   Primary hypertension 06/02/2021    REFERRING DIAG: R26.81 (ICD-10-CM) - Gait instability  THERAPY DIAG:  Unsteadiness on feet  Other abnormalities of gait and mobility  Muscle weakness (generalized)  Abnormality of gait and mobility  Difficulty in walking, not elsewhere classified  Rationale for Evaluation and Treatment Rehabilitation  PERTINENT HISTORY: Pt is a pleasant 81 y/o female referred to PT for gait  instability. Pt ambulates with RW. She reports onset of gait instability started several years ago. 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 Pt has had PT in past following surgeries. Pt reports hx of frequent falls. She has had 2 falls in past 6 months. Pt believes falls are due to difficulty picking up L foot and dragging L foot. Pt reports no injury with falls.  Pt being seen by OT for lymphedema, reports LLE more affected than RLE. Pt currently lives alone. Her spouse passed away recently in 07/12/22. Her adult son and DIL live nearby and can assist her. Pt denies any dizziness or lightheadedness. Pt reports no pain currently. 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   PRECAUTIONS: N/A  SUBJECTIVE: Pt reports no significant changes since last session. Pt went to [facility] and had a good time reuniting with many of her cousins.   PAIN:  Are you having pain? No    TODAY'S TREATMENT 02/11/22 Exercise/Activity Sets/Reps/Time/ Resistance Assistance Charge type Comments  Supine:    Hip abduction on slider  Bridge  Clamshell    10* 5 sec hold LLE only 2*10 with GTB around  knees *15 BTB 5 sec holds  Min A to maintain positioning TE Cues for proper hip alignment throughout. Increased difficulty with L LE hip abduciton and motion generally.  Added reps, resistance to several exercises, some c/o HS cramp with bridginh   STS band around knees   X 10 from elevated mat table GTB Min use of hands, CGA  TE RTB easy increased to GTB with increased challenge.   Side step with 2.5# AW over 1/2 foam rollers in a line of 3 in // bars  1 set 4 times through ( total 12 step overs ea LE)  Heavy UE assist  TE To target hip abductor muscle activation, good muscle activation here this date   March walk followed by retro walk X 4 laps in // bars with 2.5# AW  donned  SBA, UE assist  TA   Sidestepping with resistance from cable column   X 2 laps laterally each 7.5# CGA  TE Increased difficulty with side stepping to left with R LE "pushing"   Rocker board laterally  2 x 20 each direction CGA TE   Treatment Provided this session   Rationale for Evaluation and Treatment Rehabilitation  Pt educated throughout session about proper posture and technique with exercises. Improved exercise technique, movement at target joints, use of target muscles after min to mod verbal, visual, tactile cues.  Note: Portions of this document were prepared using Dragon voice recognition software and although reviewed may contain unintentional dictation errors in syntax, grammar, or spelling.         PATIENT EDUCATION: Education details: exercise technique, goals, testing and indications, plan Person educated: Patient Education method: Explanation, Demonstration, and Verbal cues Education comprehension: verbalized understanding, returned demonstration, verbal cues required, and needs further education   HOME EXERCISE PROGRAM:  no updates as of 12/17/21 08/04/2021: Access Code: [email or web address] 09/28/2021 Access Code: [email or web address] 10/28/2021 Access Code: [email or web address]   8/14/22023 Access Code: [email or web address]   PT Short Term Goals      PT SHORT TERM GOAL #1   Title Pt will be independent with HEP in order to improve strength and balance in order to decrease fall risk and improve function at home and work.    Baseline 3/7: provided pt with initial HEP 4/5; compliant    Time 6    Period Weeks    Status Achieved    Target Date 09/15/21              PT Long Term Goals       PT LONG TERM GOAL #1   Title Patient will increase FOTO score to equal to or greater than 62 to demonstrate statistically significant improvement in mobility and quality of life.    Baseline 3/7: 50 4/5: 55%; 10/28/2021=53%; 8/23: 58   Time 12    Period Weeks    Status Revise    Target Date 03/17/22       PT LONG TERM GOAL #2   Title Pt will improve BERG to >49 in order to demonstrate clinically significant improvement in balance.    Baseline 3/7: 40/56 4/5: 44/56; 01/20/22: 47   Time 12    Period Weeks    Status Revise    Target Date 03/17/22      PT LONG TERM GOAL #3   Title Pt will decrease 5TSTS by at least 3 seconds in order to demonstrate clinically significant improvement in LE strength.    Baseline 3/7: 15.36 sec with use of BUEs to assist 4/5:  9.6 seconds SUE support    Time 12    Period Weeks    Status Achieved    Target Date 03/17/22      PT LONG TERM GOAL #4   Title Pt will improve ABC by at least 13% in order to demonstrate clinically significant improvement in balance confidence.    Baseline 3/7: 50.63% 4/5: 62% 5/31= 63%; 7/20: 70.31%   Time 12    Period Weeks    Status Achieved   Target Date 03/17/22      PT LONG TERM GOAL #5   Title Patient will increase 10 meter walk test to >1.22ms as to improve gait speed for better community ambulation and to reduce fall risk.    Baseline 3/7: 0.75 m/s with 4WW 4/5: 0.96 m/s with 4 WW; 10/28/2021= 0.97 m/s with 48XQ 7/20: 0.96 m/s with 4WW   Time 12    Period Weeks    Status Partially Met    Target Date 03/17/22        Additional Long Term Goals   Additional Long Term Goals Yes      PT LONG TERM GOAL #6   Title Patient will demonstrate improved balance by exhibiting ability to bowler stance (wide tandem) bilat without UE support for >60sec to demonstrate improved frontal plane postural control.    Baseline 01/20/22: 30sec bilat, partail hand support to perform on Rt side only    Time 12    Period Weeks    Status Revised    Target Date 03/17/22        PT LONG TERM GOAL #7   Title Patient will present with improved right knee AROM <10 deg knee ext from zero for decreased limp and leg length discrepancy to aide in walking and weight shifting.    Baseline 10/28/2021= 15 deg from zero (right knee ext) ; 7/20: 3 deg from zero     Time 12    Period Weeks    Status Achieved    Target Date 03/17/22       PT LONG TERM GOAL #8   Title Patient will demo modified Ind floor to sit transfer using chair for improved ability to get up off the floor for decreased caregiver or EMS Support.    Baseline 10/28/2021= Not formally assessed yet patient reports difficulty and desire to practice to be able to perform on her own.; 7/20: CGA and min VC/demo to complete floor to stand transfer, use of chair for UE support   Time 12    Period Weeks    Status Ongoing     Target Date 03/17/22             Plan     Clinical Impression Statement Continued with current plan of care as laid out in evaluation and recent prior sessions. Pt remains motivated to advance progress toward goals in order to maximize independence and safety at home. Pt requires high level assistance and cuing for completion of exercises in order to provide adequate level of stimulation challenge while minimizing pain and discomfort when possible. Treatment focussed on on hip strength motion and stability particularly for abduction and frontal plane motion.  Patient continues to make progress with this at this time.  Pt closely monitored throughout session pt response and to maximize patient safety during interventions. Pt continues to demonstrate progress toward goals AEB progression of interventions this date either in volume or intensity.    Personal Factors and Comorbidities Age;Past/Current Experience;Time since onset of injury/illness/exacerbation;Fitness;Comorbidity  3+    Comorbidities HTN, BTKA x1 R and 2xL, B THA x2, diastolic heart failure, back pain, tachycardia    Examination-Activity Limitations Bend;Reach Overhead;Stairs;Transfers;Lift;Squat;Locomotion Level;Carry;Stand;Dressing    Examination-Participation Restrictions Volunteer;Community Activity;Shop;Cleaning;Laundry;Yard Work    [employer]chant [employer] officer Evolving/Moderate complexity    Rehab  Potential Good    PT Frequency 2x / week    PT Duration 12 weeks    PT Treatment/Interventions ADLs/Self Care Home Management;Aquatic Therapy;Biofeedback;Canalith Repostioning;Cryotherapy;Electrical Stimulation;Moist Heat;Traction;Ultrasound;DME Instruction;Gait training;Functional Biochemist, clinical;Therapeutic activities;Therapeutic exercise;Balance training;Neuromuscular re-education;Patient/family education;Orthotic Fit/Training;Wheelchair mobility training;Manual techniques;Passive range of motion;Scar mobilization;Dry needling;Energy conservation;Splinting;Taping;Vestibular;Visual/perceptual remediation/compensation;Joint Manipulations    PT Next Visit Plan Heavy front plane emphasis on hip strategy and ABDCT strength in functional context       Consulted and Agree with Plan of Care Patient            12:41 PM, 02/11/22   Particia Lather PT  12:41 PM,02/11/22

## 2022-02-15 ENCOUNTER — Encounter: Payer: Medicare Other | Admitting: Occupational Therapy

## 2022-02-15 ENCOUNTER — Ambulatory Visit: Payer: Medicare Other | Admitting: Physical Therapy

## 2022-02-16 ENCOUNTER — Ambulatory Visit: Payer: Medicare Other | Admitting: Physical Therapy

## 2022-02-16 DIAGNOSIS — R2681 Unsteadiness on feet: Secondary | ICD-10-CM | POA: Diagnosis not present

## 2022-02-16 DIAGNOSIS — R269 Unspecified abnormalities of gait and mobility: Secondary | ICD-10-CM

## 2022-02-16 DIAGNOSIS — M6281 Muscle weakness (generalized): Secondary | ICD-10-CM

## 2022-02-16 DIAGNOSIS — R2689 Other abnormalities of gait and mobility: Secondary | ICD-10-CM

## 2022-02-16 NOTE — Therapy (Signed)
OUTPATIENT PHYSICAL THERAPY TREATMENT NOTE/ Physical Therapy Progress Note   Dates of reporting period  12/17/21   to   02/16/22    Patient Name: Mary [name] MRN: [id number] DOB:[birth year], 81 y.o., female Today's Date: 02/16/2022  PCP: Valerie Roys, DO REFERRING PROVIDER: Minna Merritts, MD   PT End of Session - 02/16/22 1518     Visit Number 40    Number of Visits 53    Date for PT Re-Evaluation 03/17/22    Authorization Type Medicare Primary, A[employer] Secondary    Authorization Time Period 01/20/22-03/17/22    Progress Note Due on Visit 40    PT Start Time 1518    PT Stop Time 1558    PT Time Calculation (min) 40 min    Equipment Utilized During Treatment Gait belt    Activity Tolerance Patient tolerated treatment well;No increased pain    Behavior During Therapy WFL for tasks assessed/performed                     Past Medical History:  Diagnosis Date   Arrhythmia    Back pain    Depression with anxiety    Hip pain    Hyperlipidemia    Hypertension    Neuromuscular disorder (HCC)    Paroxysmal tachycardia ([place])    Past Surgical History:  Procedure Laterality Date   ABDOMINAL HYSTERECTOMY     BREAST BIOPSY     BREAST EXCISIONAL BIOPSY Left 2002   benign   CARDIAC CATHETERIZATION     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   x2   TOTAL HIP ARTHROPLASTY Bilateral    done twice   Patient Active Problem List   Diagnosis Date Noted   Insomnia 12/16/2021   Depression, recurrent ([place]) 26/41/5830   Diastolic heart failure ([place]) 07/30/2021   Hyperlipidemia 07/30/2021   Lymphedema 07/30/2021   Gait instability 06/02/2021   Primary hypertension 06/02/2021    REFERRING DIAG: R26.81 (ICD-10-CM) - Gait instability  THERAPY DIAG:  Unsteadiness on feet  Other abnormalities of gait and mobility  Muscle weakness (generalized)  Abnormality of gait and mobility  Rationale for Evaluation and Treatment Rehabilitation  PERTINENT HISTORY: Pt is a pleasant 81 y/o  female referred to PT for gait instability. Pt ambulates with RW. She reports onset of gait instability started several years ago. 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 Pt has had PT in past following surgeries. Pt reports hx of frequent falls. She has had 2 falls in past 6 months. Pt believes falls are due to difficulty picking up L foot and dragging L foot. Pt reports no injury with falls.  Pt being seen by OT for lymphedema, reports LLE more affected than RLE. Pt currently lives alone. Her spouse passed away recently in 04-Jul-2022. Her adult son and DIL live nearby and can assist her. Pt denies any dizziness or lightheadedness. Pt reports no pain currently. 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   PRECAUTIONS: N/A  SUBJECTIVE: Pt reports no significant changes since last session. Pt went to church and also went out to eat with her son over the weekend.   PAIN:  Are you having pain? No    TODAY'S TREATMENT 02/16/22 Rationale for Evaluation and Treatment Rehabilitation  Pt educated throughout session about proper posture and technique with exercises. Improved exercise  technique, movement at target joints, use of target muscles after min to mod verbal, visual, tactile cues.  Note: Portions of this document were prepared using Dragon voice recognition software and although reviewed may contain unintentional dictation errors in syntax, grammar, or spelling.         PATIENT EDUCATION: Education details: Patient educated extensively regarding proper utilization of cane as well as demonstration of why proper use of cane will improve her ability to ambulate independently Person educated: Patient Education method: Explanation, Demonstration, and Verbal cues Education comprehension: verbalized understanding, returned demonstration, verbal cues  required, and needs further education   HOME EXERCISE PROGRAM:  no updates as of 12/17/21 08/04/2021: Access Code: [email or web address] 09/28/2021 Access Code: [email or web address] 10/28/2021 Access Code: [email or web address]   8/14/22023 Access Code: [email or web address]   PT Short Term Goals      PT SHORT TERM GOAL #1   Title Pt will be independent with HEP in order to improve strength and balance in order to decrease fall risk and improve function at home and work.    Baseline 3/7: provided pt with initial HEP 4/5; compliant    Time 6    Period Weeks    Status Achieved    Target Date 09/15/21              PT Long Term Goals       PT LONG TERM GOAL #1   Title Patient will increase FOTO score to equal to or greater than 62 to demonstrate statistically significant improvement in mobility and quality of life.    Baseline 3/7: 50 4/5: 55%; 10/28/2021=53%; 8/23: 58   Time 12    Period Weeks    Status Revise    Target Date 03/17/22      PT LONG TERM GOAL #2   Title Pt will improve BERG to >49 in order to demonstrate clinically significant improvement in balance.    Baseline 3/7: 40/56 4/5: 44/56; 01/20/22: 47 02/16/22: ongoing    Time 12    Period Weeks    Status Revise    Target Date 03/17/22      PT LONG TERM GOAL #3   Title Pt will decrease 5TSTS by at least 3 seconds in order to demonstrate clinically significant improvement in LE strength.    Baseline 3/7: 15.36 sec with use of BUEs to assist 4/5: 9.6 seconds SUE support    Time 12    Period Weeks    Status Achieved    Target Date 03/17/22      PT LONG TERM GOAL #4   Title Pt will improve ABC by at least 13% in order to demonstrate clinically significant improvement in balance confidence.    Baseline 3/7: 50.63% 4/5: 62% 5/31= 63%; 7/20: 70.31%   Time 12    Period Weeks    Status Achieved   Target Date 03/17/22      PT LONG TERM GOAL #5   Title Patient will increase 10 meter walk test to >1.20ms as to improve gait speed for better community ambulation and to  reduce fall risk.    Baseline 3/7: 0.75 m/s with 4WW 4/5: 0.96 m/s with 4 WW; 10/28/2021= 0.97 m/s with 4WW; 7/20: 0.96 m/s with 4WW   Time 12    Period Weeks    Status Partially Met    Target Date 03/17/22        Additional Long Term Goals   Additional Long Term Goals Yes      PT LONG TERM GOAL #  6   Title Patient will demonstrate improved balance by exhibiting ability to bowler stance (wide tandem) bilat without UE support for >60sec to demonstrate improved frontal plane postural control.    Baseline 01/20/22: 30sec bilat, partail hand support to perform on Rt side only    Time 12    Period Weeks    Status Revised    Target Date 03/17/22        PT LONG TERM GOAL #7   Title Patient will present with improved right knee AROM <10 deg knee ext from zero for decreased limp and leg length discrepancy to aide in walking and weight shifting.    Baseline 10/28/2021= 15 deg from zero (right knee ext) ; 7/20: 3 deg from zero    Time 12    Period Weeks    Status Achieved    Target Date 03/17/22       PT LONG TERM GOAL #8   Title Patient will demo modified Ind floor to sit transfer using chair for improved ability to get up off the floor for decreased caregiver or EMS Support.    Baseline 10/28/2021= Not formally assessed yet patient reports difficulty and desire to practice to be able to perform on her own.; 7/20: CGA and min VC/demo to complete floor to stand transfer, use of chair for UE support 9/19: able to complete floor to and from  stand transfer with min cueing   Time 12    Period Weeks    Status Ongoing     Target Date 03/17/22             Plan     Clinical Impression Statement Patient presents to physical therapy for progress note this date.  Patient demonstrates improvement in her Merrilee Jansky balance score but only by one-point and is unable to demonstrate proper single-leg balance technique which is what is mainly limiting her on this task.  Physical therapist spent ample amount of  time educating patient regarding proper position of cane as patient tends to utilize cane in right upper extremity in order to aid her right upper extremity.  Patient instructed to practice this at home as often as she can order to improve her independence with cane ambulation.  Patient does make progress with her ability to maintain balance in wide tandem/volar stance this date meeting this goal, patient also demonstrates improved ability to perform stand to and from floor transfer indicating improved independence with activities within her home. Patient does still have deficits increase her risk of falls and are impairing her mobility and quality of life and will continue to benefit from skilled physical therapy to address these impairments.Patient's condition has the potential to improve in response to therapy. Maximum improvement is yet to be obtained. The anticipated improvement is attainable and reasonable in a generally predictable time.      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, B THA x2, diastolic heart failure, back pain, tachycardia    Examination-Activity Limitations Bend;Reach Overhead;Stairs;Transfers;Lift;Squat;Locomotion Level;Carry;Stand;Dressing    Examination-Participation Restrictions Volunteer;Community Activity;Shop;Cleaning;Laundry;Yard Work    [employer]chant [employer] officer Evolving/Moderate complexity    Rehab Potential Good    PT Frequency 2x / week    PT Duration 12 weeks    PT Treatment/Interventions ADLs/Self Care Home Management;Aquatic Therapy;Biofeedback;Canalith Repostioning;Cryotherapy;Electrical Stimulation;Moist Heat;Traction;Ultrasound;DME Instruction;Gait training;Functional Biochemist, clinical;Therapeutic activities;Therapeutic exercise;Balance training;Neuromuscular re-education;Patient/family education;Orthotic  Fit/Training;Wheelchair mobility training;Manual techniques;Passive range of motion;Scar mobilization;Dry needling;Energy conservation;Splinting;Taping;Vestibular;Visual/perceptual remediation/compensation;Joint Manipulations  PT Next Visit Plan Heavy front plane emphasis on hip strategy and ABDCT strength in functional context       Consulted and Agree with Plan of Care Patient            5:18 PM, 02/16/22   Particia Lather PT  5:18 PM,02/16/22

## 2022-02-17 ENCOUNTER — Ambulatory Visit: Payer: Medicare Other | Admitting: Physical Therapy

## 2022-02-18 ENCOUNTER — Ambulatory Visit: Payer: Medicare Other | Admitting: Physical Therapy

## 2022-02-18 DIAGNOSIS — R269 Unspecified abnormalities of gait and mobility: Secondary | ICD-10-CM

## 2022-02-18 DIAGNOSIS — M6281 Muscle weakness (generalized): Secondary | ICD-10-CM

## 2022-02-18 DIAGNOSIS — R2689 Other abnormalities of gait and mobility: Secondary | ICD-10-CM

## 2022-02-18 DIAGNOSIS — R2681 Unsteadiness on feet: Secondary | ICD-10-CM | POA: Diagnosis not present

## 2022-02-18 DIAGNOSIS — R262 Difficulty in walking, not elsewhere classified: Secondary | ICD-10-CM

## 2022-02-18 NOTE — Therapy (Signed)
OUTPATIENT PHYSICAL THERAPY TREATMENT NOTE    Patient Name: Mary [name] MRN: [id number] DOB:[birth year], 81 y.o., female Today's Date: 02/18/2022  PCP: Valerie Roys, DO REFERRING PROVIDER: Minna Merritts, MD   PT End of Session - 02/18/22 1513     Visit Number 41    Number of Visits 67    Date for PT Re-Evaluation 03/17/22    Authorization Type Medicare Primary, [employer] Secondary    Authorization Time Period 01/20/22-03/17/22    Progress Note Due on Visit 27    PT Start Time 1516    PT Stop Time 1557    PT Time Calculation (min) 41 min    Equipment Utilized During Treatment Gait belt    Activity Tolerance Patient tolerated treatment well;No increased pain    Behavior During Therapy WFL for tasks assessed/performed                      Past Medical History:  Diagnosis Date   Arrhythmia    Back pain    Depression with anxiety    Hip pain    Hyperlipidemia    Hypertension    Neuromuscular disorder (HCC)    Paroxysmal tachycardia ([place])    Past Surgical History:  Procedure Laterality Date   ABDOMINAL HYSTERECTOMY     BREAST BIOPSY     BREAST EXCISIONAL BIOPSY Left 2002   benign   CARDIAC CATHETERIZATION     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   x2   TOTAL HIP ARTHROPLASTY Bilateral    done twice   Patient Active Problem List   Diagnosis Date Noted   Insomnia 12/16/2021   Depression, recurrent ([place]) 35/46/5681   Diastolic heart failure ([place]) 07/30/2021   Hyperlipidemia 07/30/2021   Lymphedema 07/30/2021   Gait instability 06/02/2021   Primary hypertension 06/02/2021    REFERRING DIAG: R26.81 (ICD-10-CM) - Gait instability  THERAPY DIAG:  Unsteadiness on feet  Other abnormalities of gait and mobility  Muscle weakness (generalized)  Abnormality of gait and mobility  Difficulty in walking, not elsewhere classified  Rationale for Evaluation and Treatment Rehabilitation  PERTINENT HISTORY: Pt is a pleasant 81 y/o female referred to PT for gait  instability. Pt ambulates with RW. She reports onset of gait instability started several years ago. 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 Pt has had PT in past following surgeries. Pt reports hx of frequent falls. She has had 2 falls in past 6 months. Pt believes falls are due to difficulty picking up L foot and dragging L foot. Pt reports no injury with falls.  Pt being seen by OT for lymphedema, reports LLE more affected than RLE. Pt currently lives alone. Her spouse passed away recently in 13-Jul-2022. Her adult son and DIL live nearby and can assist her. Pt denies any dizziness or lightheadedness. Pt reports no pain currently. 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   PRECAUTIONS: N/A  SUBJECTIVE: Pt reports no significant changes since last session. Has been doing well and is eager to practice with cane for functional ambulation   PAIN:  Are you having pain? No    TODAY'S TREATMENT 02/18/22 Rationale for Evaluation and Treatment Rehabilitation  Exercise/Activity Sets/Reps/Time/ Resistance Assistance Charge type Comments  Supine:    Hip abduction  in supine Bridge  Clamshell    10* 5  sec hold LLE only with PT A, x 10 without PT A  2*10 with GTB around knees *20 BTB 5 sec holds  Min A to maintain positioning TE Cues for proper hip alignment throughout. Increased difficulty with L LE hip abduciton and motion generally.  Added reps, resistance to several exercises, some c/o HS cramp with bridginh   STS band around knees   X 10 from elevated mat table GTB Min use of hands, CGA  TE RTB easy increased to GTB with increased challenge.   Ambulation practice with patient's personal SPC X170 feet CGA and SPC TA Patient thoroughly educated and tasked with attention toward her gait pattern.  Patient requires significant cues for placement of  cane prior to her involved lower extremity as well as placing cane in her right hand in order to assist her left lower extremity with stability and balance.  Following approximately 75 to 80 feet of practice patient better able to understand proper gait pattern.  Patient tested practicing this at home only in safe environment but to practice it often if she wants this to be a functional means of ambulation.  Sidestepping with resistance from cable column   X 2 laps laterally each 7.5# CGA and SPC  TE Increased difficulty with side stepping to left with R LE "pushing"          PATIENT EDUCATION: Education details: Patient educated extensively regarding proper utilization of cane as well as demonstration of why proper use of cane will improve her ability to ambulate independently Person educated: Patient Education method: Explanation, Demonstration, and Verbal cues Education comprehension: verbalized understanding, returned demonstration, verbal cues required, and needs further education   HOME EXERCISE PROGRAM:  no updates as of 12/17/21 08/04/2021: Access Code: [email or web address] 09/28/2021 Access Code: [email or web address] 10/28/2021 Access Code: [email or web address]   8/14/22023 Access Code: [email or web address]   PT Short Term Goals      PT SHORT TERM GOAL #1   Title Pt will be independent with HEP in order to improve strength and balance in order to decrease fall risk and improve function at home and work.    Baseline 3/7: provided pt with initial HEP 4/5; compliant    Time 6    Period Weeks    Status Achieved    Target Date 09/15/21              PT Long Term Goals       PT LONG TERM GOAL #1   Title Patient will increase FOTO score to equal to or greater than 62 to demonstrate statistically significant improvement in mobility and quality of life.    Baseline 3/7: 50 4/5: 55%; 10/28/2021=53%; 8/23: 58   Time 12    Period Weeks    Status Revise    Target Date 03/17/22      PT LONG TERM GOAL #2   Title Pt will improve  BERG to >49 in order to demonstrate clinically significant improvement in balance.    Baseline 3/7: 40/56 4/5: 44/56; 01/20/22: 47 02/16/22: ongoing    Time 12    Period Weeks    Status Revise    Target Date 03/17/22      PT LONG TERM GOAL #3   Title Pt will decrease 5TSTS by at least 3 seconds in order to demonstrate clinically significant improvement in LE strength.    Baseline 3/7: 15.36 sec with use of BUEs to assist 4/5: 9.6 seconds SUE support    Time 12    Period  Weeks    Status Achieved    Target Date 03/17/22      PT LONG TERM GOAL #4   Title Pt will improve ABC by at least 13% in order to demonstrate clinically significant improvement in balance confidence.    Baseline 3/7: 50.63% 4/5: 62% 5/31= 63%; 7/20: 70.31%   Time 12    Period Weeks    Status Achieved   Target Date 03/17/22      PT LONG TERM GOAL #5   Title Patient will increase 10 meter walk test to >1.71ms as to improve gait speed for better community ambulation and to reduce fall risk.    Baseline 3/7: 0.75 m/s with 4WW 4/5: 0.96 m/s with 4 WW; 10/28/2021= 0.97 m/s with 48VF 7/20: 0.96 m/s with 4WW   Time 12    Period Weeks    Status Partially Met    Target Date 03/17/22        Additional Long Term Goals   Additional Long Term Goals Yes      PT LONG TERM GOAL #6   Title Patient will demonstrate improved balance by exhibiting ability to bowler stance (wide tandem) bilat without UE support for >60sec to demonstrate improved frontal plane postural control.    Baseline 01/20/22: 30sec bilat, partail hand support to perform on Rt side only    Time 12    Period Weeks    Status Revised    Target Date 03/17/22        PT LONG TERM GOAL #7   Title Patient will present with improved right knee AROM <10 deg knee ext from zero for decreased limp and leg length discrepancy to aide in walking and weight shifting.    Baseline 10/28/2021= 15 deg from zero (right knee ext) ; 7/20: 3 deg from zero    Time 12    Period Weeks     Status Achieved    Target Date 03/17/22       PT LONG TERM GOAL #8   Title Patient will demo modified Ind floor to sit transfer using chair for improved ability to get up off the floor for decreased caregiver or EMS Support.    Baseline 10/28/2021= Not formally assessed yet patient reports difficulty and desire to practice to be able to perform on her own.; 7/20: CGA and min VC/demo to complete floor to stand transfer, use of chair for UE support 9/19: able to complete floor to and from  stand transfer with min cueing   Time 12    Period Weeks    Status Ongoing     Target Date 03/17/22             Plan     Clinical Impression Statement Continued with current plan of care as laid out in evaluation and recent prior sessions. Pt remains motivated to advance progress toward goals in order to maximize independence and safety at home. Pt requires high level assistance and cuing for completion of exercises in order to provide adequate level of stimulation and perturbation. Author allows pt as much opportunity as possible to perform independent righting strategies, only stepping in when pt is unable to prevent falling to floor. Pt closely monitored throughout session for safe vitals response and to maximize patient safety during interventions.  Continue with functional ambulation with SPC this date.  Patient better able to demonstrate proper pattern and instructed to complete this practice at home and only in safe environments.  Pt continues to demonstrate progress toward  goals AEB progression of some interventions this date either in volume or intensity.     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, B THA x2, diastolic heart failure, back pain, tachycardia    Examination-Activity Limitations Bend;Reach Overhead;Stairs;Transfers;Lift;Squat;Locomotion  Level;Carry;Stand;Dressing    Examination-Participation Restrictions Volunteer;Community Activity;Shop;Cleaning;Laundry;Yard Work    [employer]chant [employer] officer Evolving/Moderate complexity    Rehab Potential Good    PT Frequency 2x / week    PT Duration 12 weeks    PT Treatment/Interventions ADLs/Self Care Home Management;Aquatic Therapy;Biofeedback;Canalith Repostioning;Cryotherapy;Electrical Stimulation;Moist Heat;Traction;Ultrasound;DME Instruction;Gait training;Functional Biochemist, clinical;Therapeutic activities;Therapeutic exercise;Balance training;Neuromuscular re-education;Patient/family education;Orthotic Fit/Training;Wheelchair mobility training;Manual techniques;Passive range of motion;Scar mobilization;Dry needling;Energy conservation;Splinting;Taping;Vestibular;Visual/perceptual remediation/compensation;Joint Manipulations    PT Next Visit Plan Heavy front plane emphasis on hip strategy and ABDCT strength in functional context, cane practice and functional stability training        Consulted and Agree with Plan of Care Patient            3:14 PM, 02/18/22   Particia Lather PT  3:14 PM,02/18/22

## 2022-02-22 ENCOUNTER — Ambulatory Visit: Payer: Medicare Other

## 2022-02-22 ENCOUNTER — Encounter: Payer: Medicare Other | Admitting: Occupational Therapy

## 2022-02-22 DIAGNOSIS — R2689 Other abnormalities of gait and mobility: Secondary | ICD-10-CM

## 2022-02-22 DIAGNOSIS — R2681 Unsteadiness on feet: Secondary | ICD-10-CM

## 2022-02-22 DIAGNOSIS — M6281 Muscle weakness (generalized): Secondary | ICD-10-CM

## 2022-02-22 NOTE — Therapy (Deleted)
OUTPATIENT PHYSICAL THERAPY TREATMENT NOTE    Patient Name: Mary [name] MRN: [id number] DOB:1940-12-12, 81 y.o., female Today's Date: 02/22/2022  PCP: Valerie Roys, DO REFERRING PROVIDER: Minna Merritts, MD              Past Medical History:  Diagnosis Date   Arrhythmia    Back pain    Depression with anxiety    Hip pain    Hyperlipidemia    Hypertension    Neuromuscular disorder ([place])    Paroxysmal tachycardia ([place])    Past Surgical History:  Procedure Laterality Date   ABDOMINAL HYSTERECTOMY     BREAST BIOPSY     BREAST EXCISIONAL BIOPSY Left 2000-07-17   benign   CARDIAC CATHETERIZATION     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   x2   TOTAL HIP ARTHROPLASTY Bilateral    done twice   Patient Active Problem List   Diagnosis Date Noted   Insomnia 12/16/2021   Depression, recurrent ([place]) 30/86/5784   Diastolic heart failure ([place]) 07/30/2021   Hyperlipidemia 07/30/2021   Lymphedema 07/30/2021   Gait instability 06/02/2021   Primary hypertension 06/02/2021    REFERRING DIAG: R26.81 (ICD-10-CM) - Gait instability  THERAPY DIAG:  No diagnosis found.  Rationale for Evaluation and Treatment Rehabilitation  PERTINENT HISTORY: Pt is a pleasant 81 y/o female referred to PT for gait instability. Pt ambulates with RW. She reports onset of gait instability started several years ago. 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 Pt has had PT in past following surgeries. Pt reports hx of frequent falls. She has had 2 falls in past 6 months. Pt believes falls are due to difficulty picking up L foot and dragging L foot. Pt reports no injury with falls.  Pt being seen by OT for lymphedema, reports LLE more affected than RLE. Pt currently lives alone. Her spouse passed away recently in 07/17/22. Her adult son and DIL live nearby and can assist her. Pt denies any dizziness or lightheadedness. Pt reports  no pain currently. 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   PRECAUTIONS: N/A  SUBJECTIVE: Pt reports no significant changes since last session. Has been doing well and is eager to practice with cane for functional ambulation   PAIN:  Are you having pain? No    TODAY'S TREATMENT 02/22/22 Rationale for Evaluation and Treatment Rehabilitation  Exercise/Activity Sets/Reps/Time/ Resistance Assistance Charge type Comments  Supine:    Hip abduction  in supine Bridge  Clamshell    10* 5 sec hold LLE only with PT A, x 10 without PT A  2*10 with GTB around knees *20 BTB 5 sec holds  Min A to maintain positioning TE Cues for proper hip alignment throughout. Increased difficulty with L LE hip abduciton and motion generally.  Added reps, resistance to several exercises, some c/o HS cramp with bridginh   STS band around knees   X 10 from elevated mat table GTB Min use of hands, CGA  TE RTB easy increased to GTB with increased challenge.   Ambulation practice with patient's personal SPC X170 feet CGA and SPC TA Patient thoroughly educated and tasked with attention toward her gait pattern.  Patient requires significant cues for placement of cane prior to her involved lower extremity as well as placing cane in her right hand in order to  assist her left lower extremity with stability and balance.  Following approximately 75 to 80 feet of practice patient better able to understand proper gait pattern.  Patient tested practicing this at home only in safe environment but to practice it often if she wants this to be a functional means of ambulation.  Sidestepping with resistance from cable column   X 2 laps laterally each 7.5# CGA and SPC  TE Increased difficulty with side stepping to left with R LE "pushing"          PATIENT EDUCATION: Education details: Patient educated extensively regarding proper utilization of cane as well as  demonstration of why proper use of cane will improve her ability to ambulate independently Person educated: Patient Education method: Explanation, Demonstration, and Verbal cues Education comprehension: verbalized understanding, returned demonstration, verbal cues required, and needs further education   HOME EXERCISE PROGRAM:  no updates as of 12/17/21 08/04/2021: Access Code: [email or web address] 09/28/2021 Access Code: [email or web address] 10/28/2021 Access Code: [email or web address]   8/14/22023 Access Code: [email or web address]   PT Short Term Goals      PT SHORT TERM GOAL #1   Title Pt will be independent with HEP in order to improve strength and balance in order to decrease fall risk and improve function at home and work.    Baseline 3/7: provided pt with initial HEP 4/5; compliant    Time 6    Period Weeks    Status Achieved    Target Date 09/15/21              PT Long Term Goals       PT LONG TERM GOAL #1   Title Patient will increase FOTO score to equal to or greater than 62 to demonstrate statistically significant improvement in mobility and quality of life.    Baseline 3/7: 50 4/5: 55%; 10/28/2021=53%; 8/23: 58   Time 12    Period Weeks    Status Revise    Target Date 03/17/22      PT LONG TERM GOAL #2   Title Pt will improve BERG to >49 in order to demonstrate clinically significant improvement in balance.    Baseline 3/7: 40/56 4/5: 44/56; 01/20/22: 47 02/16/22: ongoing    Time 12    Period Weeks    Status Revise    Target Date 03/17/22      PT LONG TERM GOAL #3   Title Pt will decrease 5TSTS by at least 3 seconds in order to demonstrate clinically significant improvement in LE strength.    Baseline 3/7: 15.36 sec with use of BUEs to assist 4/5: 9.6 seconds SUE support    Time 12    Period Weeks    Status Achieved    Target Date 03/17/22      PT LONG TERM GOAL #4   Title Pt will improve ABC by at least 13% in order to demonstrate clinically significant improvement in balance confidence.    Baseline  3/7: 50.63% 4/5: 62% 5/31= 63%; 7/20: 70.31%   Time 12    Period Weeks    Status Achieved   Target Date 03/17/22      PT LONG TERM GOAL #5   Title Patient will increase 10 meter walk test to >1.71ms as to improve gait speed for better community ambulation and to reduce fall risk.    Baseline 3/7: 0.75 m/s with 4WW 4/5: 0.96 m/s with 4 WW; 10/28/2021= 0.97 m/s with 48IO 7/20: 0.96 m/s with 4WW   Time 12  Period Weeks    Status Partially Met    Target Date 03/17/22        Additional Long Term Goals   Additional Long Term Goals Yes      PT LONG TERM GOAL #6   Title Patient will demonstrate improved balance by exhibiting ability to bowler stance (wide tandem) bilat without UE support for >60sec to demonstrate improved frontal plane postural control.    Baseline 01/20/22: 30sec bilat, partail hand support to perform on Rt side only    Time 12    Period Weeks    Status Revised    Target Date 03/17/22        PT LONG TERM GOAL #7   Title Patient will present with improved right knee AROM <10 deg knee ext from zero for decreased limp and leg length discrepancy to aide in walking and weight shifting.    Baseline 10/28/2021= 15 deg from zero (right knee ext) ; 7/20: 3 deg from zero    Time 12    Period Weeks    Status Achieved    Target Date 03/17/22       PT LONG TERM GOAL #8   Title Patient will demo modified Ind floor to sit transfer using chair for improved ability to get up off the floor for decreased caregiver or EMS Support.    Baseline 10/28/2021= Not formally assessed yet patient reports difficulty and desire to practice to be able to perform on her own.; 7/20: CGA and min VC/demo to complete floor to stand transfer, use of chair for UE support 9/19: able to complete floor to and from  stand transfer with min cueing   Time 12    Period Weeks    Status Ongoing     Target Date 03/17/22             Plan     Clinical Impression Statement Continued with current plan of care  as laid out in evaluation and recent prior sessions. Pt remains motivated to advance progress toward goals in order to maximize independence and safety at home. Pt requires high level assistance and cuing for completion of exercises in order to provide adequate level of stimulation and perturbation. Author allows pt as much opportunity as possible to perform independent righting strategies, only stepping in when pt is unable to prevent falling to floor. Pt closely monitored throughout session for safe vitals response and to maximize patient safety during interventions.  Continue with functional ambulation with SPC this date.  Patient better able to demonstrate proper pattern and instructed to complete this practice at home and only in safe environments.  Pt continues to demonstrate progress toward goals AEB progression of some interventions this date either in volume or intensity.     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, B THA x2, diastolic heart failure, back pain, tachycardia    Examination-Activity Limitations Bend;Reach Overhead;Stairs;Transfers;Lift;Squat;Locomotion Level;Carry;Stand;Dressing    Examination-Participation Restrictions Volunteer;Community Activity;Shop;Cleaning;Laundry;Yard Work    [employer]chant [employer] officer Evolving/Moderate complexity    Rehab Potential Good    PT Frequency 2x / week    PT Duration 12 weeks    PT Treatment/Interventions ADLs/Self Care Home Management;Aquatic Therapy;Biofeedback;Canalith Repostioning;Cryotherapy;Electrical Stimulation;Moist Heat;Traction;Ultrasound;DME Instruction;Gait training;Functional Biochemist, clinical;Therapeutic activities;Therapeutic exercise;Balance training;Neuromuscular re-education;Patient/family education;Orthotic Fit/Training;Wheelchair mobility training;Manual techniques;Passive  range of motion;Scar mobilization;Dry needling;Energy conservation;Splinting;Taping;Vestibular;Visual/perceptual remediation/compensation;Joint Manipulations    PT Next Visit Plan Heavy front plane emphasis on hip strategy  and ABDCT strength in functional context, cane practice and functional stability training        Consulted and Agree with Plan of Care Patient            1:27 PM, 02/22/22   Particia Lather PT  1:27 PM,02/22/22

## 2022-02-22 NOTE — Therapy (Signed)
OUTPATIENT PHYSICAL THERAPY TREATMENT NOTE    Patient Name: Mary [name] MRN: [id number] DOB:12-03-1940, 81 y.o., female Today's Date: 02/22/2022  PCP: Valerie Roys, DO REFERRING PROVIDER: Minna Merritts, MD   PT End of Session - 02/22/22 1515     Visit Number 42    Number of Visits 35    Date for PT Re-Evaluation 03/17/22    Authorization Type Medicare Primary, [employer] Secondary    Authorization Time Period 01/20/22-03/17/22    Progress Note Due on Visit 62    PT Start Time 1515    PT Stop Time 1559    PT Time Calculation (min) 44 min    Equipment Utilized During Treatment Gait belt    Activity Tolerance Patient tolerated treatment well;No increased pain    Behavior During Therapy WFL for tasks assessed/performed                      Past Medical History:  Diagnosis Date   Arrhythmia    Back pain    Depression with anxiety    Hip pain    Hyperlipidemia    Hypertension    Neuromuscular disorder (HCC)    Paroxysmal tachycardia ([place])    Past Surgical History:  Procedure Laterality Date   ABDOMINAL HYSTERECTOMY     BREAST BIOPSY     BREAST EXCISIONAL BIOPSY Left 2002   benign   CARDIAC CATHETERIZATION     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   x2   TOTAL HIP ARTHROPLASTY Bilateral    done twice   Patient Active Problem List   Diagnosis Date Noted   Insomnia 12/16/2021   Depression, recurrent ([place]) 93/73/4287   Diastolic heart failure ([place]) 07/30/2021   Hyperlipidemia 07/30/2021   Lymphedema 07/30/2021   Gait instability 06/02/2021   Primary hypertension 06/02/2021    REFERRING DIAG: R26.81 (ICD-10-CM) - Gait instability  THERAPY DIAG:  Unsteadiness on feet  Other abnormalities of gait and mobility  Muscle weakness (generalized)  Rationale for Evaluation and Treatment Rehabilitation  PERTINENT HISTORY: Pt is a pleasant 81 y/o female referred to PT for gait instability. Pt ambulates with RW. She reports onset of gait instability started several  years ago. 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 Pt has had PT in past following surgeries. Pt reports hx of frequent falls. She has had 2 falls in past 6 months. Pt believes falls are due to difficulty picking up L foot and dragging L foot. Pt reports no injury with falls.  Pt being seen by OT for lymphedema, reports LLE more affected than RLE. Pt currently lives alone. Her spouse passed away recently in 07/02/22. Her adult son and DIL live nearby and can assist her. Pt denies any dizziness or lightheadedness. Pt reports no pain currently. 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   PRECAUTIONS: N/A  SUBJECTIVE: Patient was able to see grandchildren over the weekend. No falls or LOB since last session.   PAIN:  Are you having pain? No    TODAY'S TREATMENT 02/22/22 Rationale for Evaluation and Treatment Rehabilitation  Exercise/Activity Sets/Reps/Time/ Resistance Assistance Charge type Comments  Supine:    Hip abduction  in supine Bridge  Clamshell    10* 5 sec hold LLE only with PT A, x 10 without PT A  2*10 with GTB around knees *20  BTB 5 sec holds  Min A to maintain positioning TE Cues for proper hip alignment throughout. Increased difficulty with L LE hip abduciton and motion generally.  Added reps, resistance to several exercises, some c/o HS cramp with bridginh   STS   X 10; x 2 sets ; second set with 2lb dumbbells in each hand Min use of hands, CGA  TE RTB easy increased to GTB with increased challenge.   Ambulation practice with patient's personal SPC X170 feet CGA and SPC NR Cues for sequencing to and through   6" step toe taps seated 30 seconds x 2 trials   TE   Cane weaving between two cones for figure 8 X4 trials  CGA NR   6" step hamstring lengthening stretch  30 seconds x 2 trials each LE  TE   Seated heel strike;  carryover into standing heel strike with walking Multiple trials   TE          PATIENT EDUCATION: Education details: Patient educated extensively regarding proper utilization of cane as well as demonstration of why proper use of cane will improve her ability to ambulate independently Person educated: Patient Education method: Explanation, Demonstration, and Verbal cues Education comprehension: verbalized understanding, returned demonstration, verbal cues required, and needs further education   HOME EXERCISE PROGRAM:  no updates as of 12/17/21 08/04/2021: Access Code: [email or web address] 09/28/2021 Access Code: [email or web address] 10/28/2021 Access Code: [email or web address]   8/14/22023 Access Code: [email or web address]   PT Short Term Goals      PT SHORT TERM GOAL #1   Title Pt will be independent with HEP in order to improve strength and balance in order to decrease fall risk and improve function at home and work.    Baseline 3/7: provided pt with initial HEP 4/5; compliant    Time 6    Period Weeks    Status Achieved    Target Date 09/15/21              PT Long Term Goals       PT LONG TERM GOAL #1   Title Patient will increase FOTO score to equal to or greater than 62 to demonstrate statistically significant improvement in mobility and quality of life.    Baseline 3/7: 50 4/5: 55%; 10/28/2021=53%; 8/23: 58   Time 12    Period Weeks    Status Revise    Target Date 03/17/22      PT LONG TERM GOAL #2   Title Pt will improve BERG to >49 in order to demonstrate clinically significant improvement in balance.    Baseline 3/7: 40/56 4/5: 44/56; 01/20/22: 47 02/16/22: ongoing    Time 12    Period Weeks    Status Revise    Target Date 03/17/22      PT LONG TERM GOAL #3   Title Pt will decrease 5TSTS by at least 3 seconds in order to demonstrate clinically significant improvement in LE strength.    Baseline 3/7: 15.36 sec with use of BUEs to assist 4/5: 9.6 seconds SUE support    Time 12    Period Weeks    Status  Achieved    Target Date 03/17/22      PT LONG TERM GOAL #4   Title Pt will improve ABC by at least 13% in order to demonstrate clinically significant improvement in balance confidence.    Baseline 3/7: 50.63% 4/5: 62% 5/31= 63%; 7/20: 70.31%   Time 12    Period Weeks  Status Achieved   Target Date 03/17/22      PT LONG TERM GOAL #5   Title Patient will increase 10 meter walk test to >1.13ms as to improve gait speed for better community ambulation and to reduce fall risk.    Baseline 3/7: 0.75 m/s with 4WW 4/5: 0.96 m/s with 4 WW; 10/28/2021= 0.97 m/s with 48MV 7/20: 0.96 m/s with 4WW   Time 12    Period Weeks    Status Partially Met    Target Date 03/17/22        Additional Long Term Goals   Additional Long Term Goals Yes      PT LONG TERM GOAL #6   Title Patient will demonstrate improved balance by exhibiting ability to bowler stance (wide tandem) bilat without UE support for >60sec to demonstrate improved frontal plane postural control.    Baseline 01/20/22: 30sec bilat, partail hand support to perform on Rt side only    Time 12    Period Weeks    Status Revised    Target Date 03/17/22        PT LONG TERM GOAL #7   Title Patient will present with improved right knee AROM <10 deg knee ext from zero for decreased limp and leg length discrepancy to aide in walking and weight shifting.    Baseline 10/28/2021= 15 deg from zero (right knee ext) ; 7/20: 3 deg from zero    Time 12    Period Weeks    Status Achieved    Target Date 03/17/22       PT LONG TERM GOAL #8   Title Patient will demo modified Ind floor to sit transfer using chair for improved ability to get up off the floor for decreased caregiver or EMS Support.    Baseline 10/28/2021= Not formally assessed yet patient reports difficulty and desire to practice to be able to perform on her own.; 7/20: CGA and min VC/demo to complete floor to stand transfer, use of chair for UE support 9/19: able to complete floor to and from   stand transfer with min cueing   Time 12    Period Weeks    Status Ongoing     Target Date 03/17/22             Plan     Clinical Impression Statement Patient presents with excellent motivation. Education on utilization of cane with negotiation of obstacles performed with close CGA and frequent cueing. Education on heel strike performed with patient demonstrating understanding.   Pt continues to demonstrate progress toward goals AEB progression of some interventions this date either in volume or intensity.     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, B THA x2, diastolic heart failure, back pain, tachycardia    Examination-Activity Limitations Bend;Reach Overhead;Stairs;Transfers;Lift;Squat;Locomotion Level;Carry;Stand;Dressing    Examination-Participation Restrictions Volunteer;Community Activity;Shop;Cleaning;Laundry;Yard Work    SMerchant navy officerEvolving/Moderate complexity    Rehab Potential Good    PT Frequency 2x / week    PT Duration 12 weeks    PT Treatment/Interventions ADLs/Self Care Home Management;Aquatic Therapy;Biofeedback;Canalith Repostioning;Cryotherapy;Electrical Stimulation;Moist Heat;Traction;Ultrasound;DME Instruction;Gait training;Functional mBiochemist, clinicalTherapeutic activities;Therapeutic exercise;Balance training;Neuromuscular re-education;Patient/family education;Orthotic Fit/Training;Wheelchair mobility training;Manual techniques;Passive range of motion;Scar mobilization;Dry needling;Energy conservation;Splinting;Taping;Vestibular;Visual/perceptual remediation/compensation;Joint Manipulations    PT Next Visit Plan Heavy front plane emphasis on hip strategy and ABDCT strength in functional context, cane practice and functional stability training  Consulted and Agree with Plan of Care Patient             4:08 PM, 02/22/22   Janna Arch PT  4:08 PM,02/22/22

## 2022-02-24 ENCOUNTER — Ambulatory Visit: Payer: Medicare Other | Admitting: Physical Therapy

## 2022-02-25 ENCOUNTER — Ambulatory Visit: Payer: Medicare Other | Admitting: Physical Therapy

## 2022-02-25 DIAGNOSIS — R2689 Other abnormalities of gait and mobility: Secondary | ICD-10-CM

## 2022-02-25 DIAGNOSIS — R262 Difficulty in walking, not elsewhere classified: Secondary | ICD-10-CM

## 2022-02-25 DIAGNOSIS — R2681 Unsteadiness on feet: Secondary | ICD-10-CM | POA: Diagnosis not present

## 2022-02-25 DIAGNOSIS — M6281 Muscle weakness (generalized): Secondary | ICD-10-CM

## 2022-02-25 DIAGNOSIS — R269 Unspecified abnormalities of gait and mobility: Secondary | ICD-10-CM

## 2022-02-25 NOTE — Therapy (Signed)
OUTPATIENT PHYSICAL THERAPY TREATMENT NOTE    Patient Name: Mary [name] MRN: [id number] DOB:30-Jul-1940, 81 y.o., female Today's Date: 02/25/2022  PCP: Valerie Roys, DO REFERRING PROVIDER: Minna Merritts, MD   PT End of Session - 02/25/22 1528     Visit Number 43    Number of Visits 32    Date for PT Re-Evaluation 03/17/22    Authorization Type Medicare Primary, [employer] Secondary    Authorization Time Period 01/20/22-03/17/22    Progress Note Due on Visit 90    PT Start Time 1515    PT Stop Time 1548    PT Time Calculation (min) 33 min    Equipment Utilized During Treatment Gait belt    Activity Tolerance Patient tolerated treatment well;No increased pain    Behavior During Therapy WFL for tasks assessed/performed                       Past Medical History:  Diagnosis Date   Arrhythmia    Back pain    Depression with anxiety    Hip pain    Hyperlipidemia    Hypertension    Neuromuscular disorder (HCC)    Paroxysmal tachycardia ([place])    Past Surgical History:  Procedure Laterality Date   ABDOMINAL HYSTERECTOMY     BREAST BIOPSY     BREAST EXCISIONAL BIOPSY Left 2002   benign   CARDIAC CATHETERIZATION     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   x2   TOTAL HIP ARTHROPLASTY Bilateral    done twice   Patient Active Problem List   Diagnosis Date Noted   Insomnia 12/16/2021   Depression, recurrent ([place]) 17/91/5056   Diastolic heart failure ([place]) 07/30/2021   Hyperlipidemia 07/30/2021   Lymphedema 07/30/2021   Gait instability 06/02/2021   Primary hypertension 06/02/2021    REFERRING DIAG: R26.81 (ICD-10-CM) - Gait instability  THERAPY DIAG:  Unsteadiness on feet  Other abnormalities of gait and mobility  Muscle weakness (generalized)  Abnormality of gait and mobility  Difficulty in walking, not elsewhere classified  Rationale for Evaluation and Treatment Rehabilitation  PERTINENT HISTORY: Pt is a pleasant 81 y/o female referred to PT for gait  instability. Pt ambulates with RW. She reports onset of gait instability started several years ago. 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 Pt has had PT in past following surgeries. Pt reports hx of frequent falls. She has had 2 falls in past 6 months. Pt believes falls are due to difficulty picking up L foot and dragging L foot. Pt reports no injury with falls.  Pt being seen by OT for lymphedema, reports LLE more affected than RLE. Pt currently lives alone. Her spouse passed away recently in Jul 11, 2022. Her adult son and DIL live nearby and can assist her. Pt denies any dizziness or lightheadedness. Pt reports no pain currently. 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   PRECAUTIONS: N/A  SUBJECTIVE: Pt reports cane use to get in restaurant since last session, pt encouraged to practice more in home setting prior to further community cane utilization. No falls or LOB since last session.   PAIN:  Are you having pain? No    TODAY'S TREATMENT 02/25/22 Rationale for Evaluation and Treatment Rehabilitation  Exercise/Activity Sets/Reps/Time/ Resistance Assistance Charge type Comments  STS   X 10; x  2 sets ; with GTB around legs  Min use of hands, CGA  TA GTB to challenge hip abductors   Ambulation practice with patient's personal SPC X170 feet CGA and SPC TA Cues for sequencing to and through   [place] between two cones for figure 8 with SPC  X4 trials  CGA TA   Cone side stepping  X 4 cones approx 6 feet apart changing direction each completed 1 lap each direction   TA             Had to end session a few minutes early due to pt having obligation close to end of PT session.    PATIENT EDUCATION: Education details: Patient educated extensively regarding proper utilization of cane as well as demonstration of why proper use of cane will  improve her ability to ambulate independently Person educated: Patient Education method: Explanation, Demonstration, and Verbal cues Education comprehension: verbalized understanding, returned demonstration, verbal cues required, and needs further education   HOME EXERCISE PROGRAM:  no updates as of 12/17/21 08/04/2021: Access Code: [email or web address] 09/28/2021 Access Code: [email or web address] 10/28/2021 Access Code: [email or web address]   8/14/22023 Access Code: [email or web address]   PT Short Term Goals      PT SHORT TERM GOAL #1   Title Pt will be independent with HEP in order to improve strength and balance in order to decrease fall risk and improve function at home and work.    Baseline 3/7: provided pt with initial HEP 4/5; compliant    Time 6    Period Weeks    Status Achieved    Target Date 09/15/21              PT Long Term Goals       PT LONG TERM GOAL #1   Title Patient will increase FOTO score to equal to or greater than 62 to demonstrate statistically significant improvement in mobility and quality of life.    Baseline 3/7: 50 4/5: 55%; 10/28/2021=53%; 8/23: 58   Time 12    Period Weeks    Status Revise    Target Date 03/17/22      PT LONG TERM GOAL #2   Title Pt will improve BERG to >49 in order to demonstrate clinically significant improvement in balance.    Baseline 3/7: 40/56 4/5: 44/56; 01/20/22: 47 02/16/22: ongoing    Time 12    Period Weeks    Status Revise    Target Date 03/17/22      PT LONG TERM GOAL #3   Title Pt will decrease 5TSTS by at least 3 seconds in order to demonstrate clinically significant improvement in LE strength.    Baseline 3/7: 15.36 sec with use of BUEs to assist 4/5: 9.6 seconds SUE support    Time 12    Period Weeks    Status Achieved    Target Date 03/17/22      PT LONG TERM GOAL #4   Title Pt will improve ABC by at least 13% in order to demonstrate clinically significant improvement in balance confidence.    Baseline 3/7: 50.63% 4/5: 62% 5/31= 63%; 7/20: 70.31%    Time 12    Period Weeks    Status Achieved   Target Date 03/17/22      PT LONG TERM GOAL #5   Title Patient will increase 10 meter walk test to >1.45ms as to improve gait speed for better community ambulation and to reduce fall risk.    Baseline 3/7: 0.75 m/s with  6UY 4/5: 0.96 m/s with 4 WW; 10/28/2021= 0.97 m/s with 4IH; 7/20: 0.96 m/s with 4WW   Time 12    Period Weeks    Status Partially Met    Target Date 03/17/22        Additional Long Term Goals   Additional Long Term Goals Yes      PT LONG TERM GOAL #6   Title Patient will demonstrate improved balance by exhibiting ability to bowler stance (wide tandem) bilat without UE support for >60sec to demonstrate improved frontal plane postural control.    Baseline 01/20/22: 30sec bilat, partail hand support to perform on Rt side only    Time 12    Period Weeks    Status Revised    Target Date 03/17/22        PT LONG TERM GOAL #7   Title Patient will present with improved right knee AROM <10 deg knee ext from zero for decreased limp and leg length discrepancy to aide in walking and weight shifting.    Baseline 10/28/2021= 15 deg from zero (right knee ext) ; 7/20: 3 deg from zero    Time 12    Period Weeks    Status Achieved    Target Date 03/17/22       PT LONG TERM GOAL #8   Title Patient will demo modified Ind floor to sit transfer using chair for improved ability to get up off the floor for decreased caregiver or EMS Support.    Baseline 10/28/2021= Not formally assessed yet patient reports difficulty and desire to practice to be able to perform on her own.; 7/20: CGA and min VC/demo to complete floor to stand transfer, use of chair for UE support 9/19: able to complete floor to and from  stand transfer with min cueing   Time 12    Period Weeks    Status Ongoing     Target Date 03/17/22             Plan     Clinical Impression Statement Patient presents with excellent motivation. Continued with practice with cane  utilization. Pt demonstrates improved step through pattern of gait as well as improved cane sequencing compared to past sessions. Pt also continues with LE strength and dynamic balance with SPC with good response. Pt will continue to benefit from skilled physical therapy intervention to address impairments, improve QOL, and attain therapy goals.      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, B THA x2, diastolic heart failure, back pain, tachycardia    Examination-Activity Limitations Bend;Reach Overhead;Stairs;Transfers;Lift;Squat;Locomotion Level;Carry;Stand;Dressing    Examination-Participation Restrictions Volunteer;Community Activity;Shop;Cleaning;Laundry;Yard Work    Merchant navy officer Evolving/Moderate complexity    Rehab Potential Good    PT Frequency 2x / week    PT Duration 12 weeks    PT Treatment/Interventions ADLs/Self Care Home Management;Aquatic Therapy;Biofeedback;Canalith Repostioning;Cryotherapy;Electrical Stimulation;Moist Heat;Traction;Ultrasound;DME Instruction;Gait training;Functional Biochemist, clinical;Therapeutic activities;Therapeutic exercise;Balance training;Neuromuscular re-education;Patient/family education;Orthotic Fit/Training;Wheelchair mobility training;Manual techniques;Passive range of motion;Scar mobilization;Dry needling;Energy conservation;Splinting;Taping;Vestibular;Visual/perceptual remediation/compensation;Joint Manipulations    PT Next Visit Plan Heavy front plane emphasis on hip strategy and ABDCT strength in functional context, cane practice and functional stability training        Consulted and Agree with Plan of Care Patient            4:03 PM, 02/25/22   Particia Lather PT  4:03 PM,02/25/22

## 2022-03-01 ENCOUNTER — Encounter: Payer: Medicare Other | Admitting: Occupational Therapy

## 2022-03-01 ENCOUNTER — Ambulatory Visit: Payer: Medicare Other | Admitting: Physical Therapy

## 2022-03-03 ENCOUNTER — Ambulatory Visit: Payer: Medicare Other | Admitting: Physical Therapy

## 2022-03-08 ENCOUNTER — Encounter: Payer: Medicare Other | Admitting: Occupational Therapy

## 2022-03-08 ENCOUNTER — Ambulatory Visit: Payer: Medicare Other | Admitting: Physical Therapy

## 2022-03-10 ENCOUNTER — Ambulatory Visit: Payer: Medicare Other | Attending: Cardiovascular Disease

## 2022-03-10 ENCOUNTER — Encounter: Payer: Self-pay | Admitting: Physical Therapy

## 2022-03-10 DIAGNOSIS — R278 Other lack of coordination: Secondary | ICD-10-CM | POA: Diagnosis present

## 2022-03-10 DIAGNOSIS — M6281 Muscle weakness (generalized): Secondary | ICD-10-CM | POA: Diagnosis present

## 2022-03-10 DIAGNOSIS — R2681 Unsteadiness on feet: Secondary | ICD-10-CM | POA: Insufficient documentation

## 2022-03-10 DIAGNOSIS — R2689 Other abnormalities of gait and mobility: Secondary | ICD-10-CM | POA: Diagnosis present

## 2022-03-10 DIAGNOSIS — R262 Difficulty in walking, not elsewhere classified: Secondary | ICD-10-CM | POA: Diagnosis present

## 2022-03-10 DIAGNOSIS — R269 Unspecified abnormalities of gait and mobility: Secondary | ICD-10-CM | POA: Diagnosis present

## 2022-03-10 NOTE — Therapy (Signed)
OUTPATIENT PHYSICAL THERAPY TREATMENT NOTE    Patient Name: Mary [name] MRN: [id number] DOB:11-Jul-1940, 81 y.o., female Today's Date: 03/10/2022  PCP: Valerie Roys, DO REFERRING PROVIDER: Minna Merritts, MD   PT End of Session - 03/10/22 1308     Visit Number 44    Number of Visits 10    Date for PT Re-Evaluation 03/17/22    Authorization Type Medicare Primary, [employer] Secondary    Authorization Time Period 01/20/22-03/17/22    Progress Note Due on Visit 66    PT Start Time 1305    PT Stop Time 1345    PT Time Calculation (min) 40 min    Equipment Utilized During Treatment Gait belt    Activity Tolerance Patient tolerated treatment well;No increased pain    Behavior During Therapy WFL for tasks assessed/performed                       Past Medical History:  Diagnosis Date   Arrhythmia    Back pain    Depression with anxiety    Hip pain    Hyperlipidemia    Hypertension    Neuromuscular disorder (HCC)    Paroxysmal tachycardia ([place])    Past Surgical History:  Procedure Laterality Date   ABDOMINAL HYSTERECTOMY     BREAST BIOPSY     BREAST EXCISIONAL BIOPSY Left 2002   benign   CARDIAC CATHETERIZATION     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   x2   TOTAL HIP ARTHROPLASTY Bilateral    done twice   Patient Active Problem List   Diagnosis Date Noted   Insomnia 12/16/2021   Depression, recurrent ([place]) 86/76/1950   Diastolic heart failure ([place]) 07/30/2021   Hyperlipidemia 07/30/2021   Lymphedema 07/30/2021   Gait instability 06/02/2021   Primary hypertension 06/02/2021    REFERRING DIAG: R26.81 (ICD-10-CM) - Gait instability  THERAPY DIAG:  Unsteadiness on feet  Other abnormalities of gait and mobility  Muscle weakness (generalized)  Abnormality of gait and mobility  Difficulty in walking, not elsewhere classified  Rationale for Evaluation and Treatment Rehabilitation  PERTINENT HISTORY: Pt is a pleasant 81 y/o female referred to PT for gait  instability. Pt ambulates with RW. She reports onset of gait instability started several years ago. 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 Pt has had PT in past following surgeries. Pt reports hx of frequent falls. She has had 2 falls in past 6 months. Pt believes falls are due to difficulty picking up L foot and dragging L foot. Pt reports no injury with falls.  Pt being seen by OT for lymphedema, reports LLE more affected than RLE. Pt currently lives alone. Her spouse passed away recently in 2022-07-11. Her adult son and DIL live nearby and can assist her. Pt denies any dizziness or lightheadedness. Pt reports no pain currently. 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   PRECAUTIONS: N/A  SUBJECTIVE: Pt reports no falls. Her lymphedema is "acting up" on her LLE with visible swelling. Reports having difficulty getting into bed. Wants to practice today. No pain.   PAIN:  Are you having pain? No    TODAY'S TREATMENT 03/10/22 Rationale for Evaluation and Treatment Rehabilitation   There.Act:   Education on bed mobility to assist in mobilizing LE's into bed entering on R side  of bed bringing LE's to the L onto mattress. PT demo with hooking L foot over R foot to assist. Can perform well with x3 reps and practice. Bouts of momentum needed when lying down. Education provided on using torso body  weight as fulcrum to assist in elevating LE's into bed.   Performed 2 reps with red mat on top of table to mimic softer bed surface. Unable to perform without significant momentum and miNA at LE's with PT.  Last rep performed with 5" step. Pt easily entering bed. Educated on benefits of a 4-6" step to assist entering bed.   There.ex:   X10 STS with GTB at distal femurs for lateral hip engagement. Education on STS mechanics and anterior weight shift to assist  in standing.  Continued education on hamstring stretching to improve L knee extension to assist in improved gait mechanics.   Neuro Re-Ed:   Gait with SPC in RUE. Performing with 3 point step through pattern. 80'. Trialed gait with SPC in LUE. Unable to perform step through gait with episodes of R knee buckling. Required regression to step to pattern with mod VC's for correct sequencing. Encouraged cane use in RUE for safety.      PATIENT EDUCATION: Education details: Patient educated extensively regarding proper utilization of cane as well as demonstration of why proper use of cane will improve her ability to ambulate independently Person educated: Patient Education method: Explanation, Demonstration, and Verbal cues Education comprehension: verbalized understanding, returned demonstration, verbal cues required, and needs further education   HOME EXERCISE PROGRAM:  no updates as of 12/17/21 08/04/2021: Access Code: [email or web address] 09/28/2021 Access Code: [email or web address] 10/28/2021 Access Code: [email or web address]   8/14/22023 Access Code: [email or web address]   PT Short Term Goals      PT SHORT TERM GOAL #1   Title Pt will be independent with HEP in order to improve strength and balance in order to decrease fall risk and improve function at home and work.    Baseline 3/7: provided pt with initial HEP 4/5; compliant    Time 6    Period Weeks    Status Achieved    Target Date 09/15/21              PT Long Term Goals       PT LONG TERM GOAL #1   Title Patient will increase FOTO score to equal to or greater than 62 to demonstrate statistically significant improvement in mobility and quality of life.    Baseline 3/7: 50 4/5: 55%; 10/28/2021=53%; 8/23: 58   Time 12    Period Weeks    Status Revise    Target Date 03/17/22      PT LONG TERM GOAL #2   Title Pt will improve BERG to >49 in order to demonstrate clinically significant improvement in balance.    Baseline 3/7: 40/56 4/5: 44/56; 01/20/22: 47 02/16/22:  ongoing    Time 12    Period Weeks    Status Revise    Target Date 03/17/22      PT LONG TERM GOAL #3   Title Pt will decrease 5TSTS by at least 3 seconds in order to demonstrate clinically significant improvement in LE strength.    Baseline 3/7: 15.36 sec with use of BUEs to assist 4/5: 9.6 seconds SUE support    Time 12    Period Weeks    Status Achieved    Target Date 03/17/22      PT LONG TERM GOAL #4   Title  Pt will improve ABC by at least 13% in order to demonstrate clinically significant improvement in balance confidence.    Baseline 3/7: 50.63% 4/5: 62% 5/31= 63%; 7/20: 70.31%   Time 12    Period Weeks    Status Achieved   Target Date 03/17/22      PT LONG TERM GOAL #5   Title Patient will increase 10 meter walk test to >1.69ms as to improve gait speed for better community ambulation and to reduce fall risk.    Baseline 3/7: 0.75 m/s with 4WW 4/5: 0.96 m/s with 4 WW; 10/28/2021= 0.97 m/s with 49NA 7/20: 0.96 m/s with 4WW   Time 12    Period Weeks    Status Partially Met    Target Date 03/17/22        Additional Long Term Goals   Additional Long Term Goals Yes      PT LONG TERM GOAL #6   Title Patient will demonstrate improved balance by exhibiting ability to bowler stance (wide tandem) bilat without UE support for >60sec to demonstrate improved frontal plane postural control.    Baseline 01/20/22: 30sec bilat, partail hand support to perform on Rt side only    Time 12    Period Weeks    Status Revised    Target Date 03/17/22        PT LONG TERM GOAL #7   Title Patient will present with improved right knee AROM <10 deg knee ext from zero for decreased limp and leg length discrepancy to aide in walking and weight shifting.    Baseline 10/28/2021= 15 deg from zero (right knee ext) ; 7/20: 3 deg from zero    Time 12    Period Weeks    Status Achieved    Target Date 03/17/22       PT LONG TERM GOAL #8   Title Patient will demo modified Ind floor to sit transfer  using chair for improved ability to get up off the floor for decreased caregiver or EMS Support.    Baseline 10/28/2021= Not formally assessed yet patient reports difficulty and desire to practice to be able to perform on her own.; 7/20: CGA and min VC/demo to complete floor to stand transfer, use of chair for UE support 9/19: able to complete floor to and from  stand transfer with min cueing   Time 12    Period Weeks    Status Ongoing     Target Date 03/17/22             Plan     Clinical Impression Statement Variety of impairments treated this date. Educated pt on varying techniques to improve entering bed due to LE weakness and AROM limitations with practicing different options. Also experimented gait with SPC in each UEs. Notable improved stability with SPC in RUE compared to LUE. Pt understanding of education this date with continued motivation to improve. Pt still remains with gait, balance and strength impairments thus will benefit from skilled PT services to address these deficits to optimize return to full function.     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, B THA x2, diastolic heart failure, back pain, tachycardia    Examination-Activity Limitations Bend;Reach Overhead;Stairs;Transfers;Lift;Squat;Locomotion Level;Carry;Stand;Dressing    Examination-Participation Restrictions Volunteer;Community Activity;Shop;Cleaning;Laundry;Yard Work    Stability/Clinical Decision Making Evolving/Moderate complexity    Rehab Potential Good    PT Frequency 2x / week  PT Duration 12 weeks    PT Treatment/Interventions ADLs/Self Care Home Management;Aquatic Therapy;Biofeedback;Canalith Repostioning;Cryotherapy;Electrical Stimulation;Moist Heat;Traction;Ultrasound;DME Instruction;Gait training;Functional Biochemist, clinical;Therapeutic  activities;Therapeutic exercise;Balance training;Neuromuscular re-education;Patient/family education;Orthotic Fit/Training;Wheelchair mobility training;Manual techniques;Passive range of motion;Scar mobilization;Dry needling;Energy conservation;Splinting;Taping;Vestibular;Visual/perceptual remediation/compensation;Joint Manipulations    PT Next Visit Plan Heavy front plane emphasis on hip strategy and ABDCT strength in functional context, cane practice and functional stability training        Consulted and Agree with Plan of Care Patient            Salem Caster. Fairly IV, PT, DPT Physical Therapist- [facility]  3:42 PM,03/10/22

## 2022-03-15 ENCOUNTER — Encounter: Payer: Self-pay | Admitting: Physical Therapy

## 2022-03-15 ENCOUNTER — Ambulatory Visit: Payer: Medicare Other | Admitting: Physical Therapy

## 2022-03-15 ENCOUNTER — Encounter: Payer: Medicare Other | Admitting: Occupational Therapy

## 2022-03-15 DIAGNOSIS — R2681 Unsteadiness on feet: Secondary | ICD-10-CM | POA: Diagnosis not present

## 2022-03-15 DIAGNOSIS — M6281 Muscle weakness (generalized): Secondary | ICD-10-CM

## 2022-03-15 DIAGNOSIS — R269 Unspecified abnormalities of gait and mobility: Secondary | ICD-10-CM

## 2022-03-15 DIAGNOSIS — R2689 Other abnormalities of gait and mobility: Secondary | ICD-10-CM

## 2022-03-15 NOTE — Therapy (Signed)
OUTPATIENT PHYSICAL THERAPY TREATMENT NOTE    Patient Name: Mary [name] MRN: [id number] DOB:09/12/1940, 81 y.o., female Today's Date: 03/15/2022  PCP: Valerie Roys, DO REFERRING PROVIDER: Minna Merritts, MD   PT End of Session - 03/15/22 1520     Visit Number 45    Number of Visits 60    Date for PT Re-Evaluation 03/17/22    Authorization Type Medicare Primary, [employer] Secondary    Authorization Time Period 01/20/22-03/17/22    Progress Note Due on Visit 30    PT Start Time 15-Jul-1514    PT Stop Time 1559    PT Time Calculation (min) 43 min    Equipment Utilized During Treatment Gait belt    Activity Tolerance Patient tolerated treatment well;No increased pain    Behavior During Therapy WFL for tasks assessed/performed                        Past Medical History:  Diagnosis Date   Arrhythmia    Back pain    Depression with anxiety    Hip pain    Hyperlipidemia    Hypertension    Neuromuscular disorder (HCC)    Paroxysmal tachycardia ([place])    Past Surgical History:  Procedure Laterality Date   ABDOMINAL HYSTERECTOMY     BREAST BIOPSY     BREAST EXCISIONAL BIOPSY Left 07-15-2000   benign   CARDIAC CATHETERIZATION     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   x2   TOTAL HIP ARTHROPLASTY Bilateral    done twice   Patient Active Problem List   Diagnosis Date Noted   Insomnia 12/16/2021   Depression, recurrent ([place]) 22/48/2500   Diastolic heart failure ([place]) 07/30/2021   Hyperlipidemia 07/30/2021   Lymphedema 07/30/2021   Gait instability 06/02/2021   Primary hypertension 06/02/2021    REFERRING DIAG: R26.81 (ICD-10-CM) - Gait instability  THERAPY DIAG:  Unsteadiness on feet  Other abnormalities of gait and mobility  Muscle weakness (generalized)  Abnormality of gait and mobility  Rationale for Evaluation and Treatment Rehabilitation  PERTINENT HISTORY: Pt is a pleasant 81 y/o female referred to PT for gait instability. Pt ambulates with RW. She reports  onset of gait instability started several years ago. 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 Pt has had PT in past following surgeries. Pt reports hx of frequent falls. She has had 2 falls in past 6 months. Pt believes falls are due to difficulty picking up L foot and dragging L foot. Pt reports no injury with falls.  Pt being seen by OT for lymphedema, reports LLE more affected than RLE. Pt currently lives alone. Her spouse passed away recently in 07-15-22. Her adult son and DIL live nearby and can assist her. Pt denies any dizziness or lightheadedness. Pt reports no pain currently. 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   PRECAUTIONS: N/A  SUBJECTIVE: Pt reports no falls. Her lymphedema is "acting up" on her LLE with visible swelling. Reports having difficulty getting into bed. Wants to practice today. No pain.   PAIN:  Are you having pain? No    TODAY'S TREATMENT 03/15/22 Rationale for Evaluation and Treatment Rehabilitation  Exercise/Activity Sets/Reps/Time/ Resistance Assistance Charge type Comments  STS  X 10; x 2 sets ; with GTB around distal femurs Min use of hands,  CGA  TA GTB to challenge hip abductors, cues for proper foot placement for optimal biomechanincal advantage for STS  Cues for eccentric control, education regarding benefits of eccentric strength  Ambulation practice with patient's personal SPC X170 feet CGA and SPC TA Cues for sequencing to and through   [place] between two cones for figure 8 with SPC  X4 trials  CGA TA   Obstacle course with SPC  Cones, yoga mat, foam mat, cones  CGA TA To practice with cane ambulation in carious environments.   Seated clamshell L LE focus   15 x 5 sec holds   TE   Lateral sidestepping to right with resistance column 2 laps x 7.5#  TE Uses SPC for balance, cues for proper side  step.        PATIENT EDUCATION: Education details: Patient educated extensively regarding proper utilization of cane as well as demonstration of why proper use of cane will improve her ability to ambulate independently Person educated: Patient Education method: Explanation, Demonstration, and Verbal cues Education comprehension: verbalized understanding, returned demonstration, verbal cues required, and needs further education   HOME EXERCISE PROGRAM:  no updates as of 12/17/21 08/04/2021: Access Code: [email or web address] 09/28/2021 Access Code: [email or web address] 10/28/2021 Access Code: [email or web address]   8/14/22023 Access Code: [email or web address]   PT Short Term Goals      PT SHORT TERM GOAL #1   Title Pt will be independent with HEP in order to improve strength and balance in order to decrease fall risk and improve function at home and work.    Baseline 3/7: provided pt with initial HEP 4/5; compliant    Time 6    Period Weeks    Status Achieved    Target Date 09/15/21              PT Long Term Goals       PT LONG TERM GOAL #1   Title Patient will increase FOTO score to equal to or greater than 62 to demonstrate statistically significant improvement in mobility and quality of life.    Baseline 3/7: 50 4/5: 55%; 10/28/2021=53%; 8/23: 58   Time 12    Period Weeks    Status Revise    Target Date 03/17/22      PT LONG TERM GOAL #2   Title Pt will improve BERG to >49 in order to demonstrate clinically significant improvement in balance.    Baseline 3/7: 40/56 4/5: 44/56; 01/20/22: 47 02/16/22: ongoing    Time 12    Period Weeks    Status Revise    Target Date 03/17/22      PT LONG TERM GOAL #3   Title Pt will decrease 5TSTS by at least 3 seconds in order to demonstrate clinically significant improvement in LE strength.    Baseline 3/7: 15.36 sec with use of BUEs to assist 4/5: 9.6 seconds SUE support    Time 12    Period Weeks    Status Achieved    Target Date 03/17/22      PT LONG TERM GOAL #4   Title  Pt will improve ABC by at least 13% in order to demonstrate clinically significant improvement in balance confidence.    Baseline 3/7: 50.63% 4/5: 62% 5/31= 63%; 7/20: 70.31%   Time 12    Period Weeks    Status Achieved   Target Date 03/17/22      PT LONG TERM GOAL #5   Title Patient will increase 10 meter walk test to >  1.0m/s as to improve gait speed for better community ambulation and to reduce fall risk.    Baseline 3/7: 0.75 m/s with 4WW 4/5: 0.96 m/s with 4 WW; 10/28/2021= 0.97 m/s with 4WW; 7/20: 0.96 m/s with 4WW   Time 12    Period Weeks    Status Partially Met    Target Date 03/17/22        Additional Long Term Goals   Additional Long Term Goals Yes      PT LONG TERM GOAL #6   Title Patient will demonstrate improved balance by exhibiting ability to bowler stance (wide tandem) bilat without UE support for >60sec to demonstrate improved frontal plane postural control.    Baseline 01/20/22: 30sec bilat, partail hand support to perform on Rt side only    Time 12    Period Weeks    Status Revised    Target Date 03/17/22        PT LONG TERM GOAL #7   Title Patient will present with improved right knee AROM <10 deg knee ext from zero for decreased limp and leg length discrepancy to aide in walking and weight shifting.    Baseline 10/28/2021= 15 deg from zero (right knee ext) ; 7/20: 3 deg from zero    Time 12    Period Weeks    Status Achieved    Target Date 03/17/22       PT LONG TERM GOAL #8   Title Patient will demo modified Ind floor to sit transfer using chair for improved ability to get up off the floor for decreased caregiver or EMS Support.    Baseline 10/28/2021= Not formally assessed yet patient reports difficulty and desire to practice to be able to perform on her own.; 7/20: CGA and min VC/demo to complete floor to stand transfer, use of chair for UE support 9/19: able to complete floor to and from  stand transfer with min cueing   Time 12    Period Weeks    Status  Ongoing     Target Date 03/17/22             Plan     Clinical Impression Statement Patient presents with excellent motivation for completion of physical therapy activities.  Patient continues with progression and ambulating with SPC as well as with frontal plane and hip muscle strengthening.  Patient continues to make progress at this time will continue to benefit from skilled physical therapy to improve her lower extremity strength, mobility, and quality of life.    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, B THA x2, diastolic heart failure, back pain, tachycardia    Examination-Activity Limitations Bend;Reach Overhead;Stairs;Transfers;Lift;Squat;Locomotion Level;Carry;Stand;Dressing    Examination-Participation Restrictions Volunteer;Community Activity;Shop;Cleaning;Laundry;Yard Work    Stability/Clinical Decision Making Evolving/Moderate complexity    Rehab Potential Good    PT Frequency 2x / week    PT Duration 12 weeks    PT Treatment/Interventions ADLs/Self Care Home Management;Aquatic Therapy;Biofeedback;Canalith Repostioning;Cryotherapy;Electrical Stimulation;Moist Heat;Traction;Ultrasound;DME Instruction;Gait training;Functional mobility training;Stair training;Therapeutic activities;Therapeutic exercise;Balance training;Neuromuscular re-education;Patient/family education;Orthotic Fit/Training;Wheelchair mobility training;Manual techniques;Passive range of motion;Scar mobilization;Dry needling;Energy conservation;Splinting;Taping;Vestibular;Visual/perceptual remediation/compensation;Joint Manipulations    PT Next Visit Plan Heavy front plane emphasis on hip strategy and ABDCT strength in functional context, cane practice and functional stability training        Consulted and Agree with Plan of Care Patient            Christopher B Byrd    PT  4:58 PM,03/15/22 

## 2022-03-17 ENCOUNTER — Ambulatory Visit: Payer: Medicare Other | Admitting: Physical Therapy

## 2022-03-17 DIAGNOSIS — R2689 Other abnormalities of gait and mobility: Secondary | ICD-10-CM

## 2022-03-17 DIAGNOSIS — R2681 Unsteadiness on feet: Secondary | ICD-10-CM

## 2022-03-17 DIAGNOSIS — R269 Unspecified abnormalities of gait and mobility: Secondary | ICD-10-CM

## 2022-03-17 DIAGNOSIS — M6281 Muscle weakness (generalized): Secondary | ICD-10-CM

## 2022-03-17 NOTE — Therapy (Signed)
OUTPATIENT PHYSICAL THERAPY TREATMENT NOTE    Patient Name: Mary [name] MRN: [id number] DOB:04/19/1941, 81 y.o., female Today's Date: 03/17/2022  PCP: Valerie Roys, DO REFERRING PROVIDER: Minna Merritts, MD   PT End of Session - 03/17/22 1308     Visit Number 47    Number of Visits 51    Date for PT Re-Evaluation 03/17/22    Authorization Type Medicare Primary, [employer] Secondary    Authorization Time Period 01/20/22-03/17/22    Progress Note Due on Visit 79    PT Start Time 1305    PT Stop Time 1344    PT Time Calculation (min) 39 min    Equipment Utilized During Treatment Gait belt    Activity Tolerance Patient tolerated treatment well;No increased pain    Behavior During Therapy WFL for tasks assessed/performed                         Past Medical History:  Diagnosis Date   Arrhythmia    Back pain    Depression with anxiety    Hip pain    Hyperlipidemia    Hypertension    Neuromuscular disorder (HCC)    Paroxysmal tachycardia ([place])    Past Surgical History:  Procedure Laterality Date   ABDOMINAL HYSTERECTOMY     BREAST BIOPSY     BREAST EXCISIONAL BIOPSY Left 2002   benign   CARDIAC CATHETERIZATION     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   x2   TOTAL HIP ARTHROPLASTY Bilateral    done twice   Patient Active Problem List   Diagnosis Date Noted   Insomnia 12/16/2021   Depression, recurrent ([place]) 12/87/8676   Diastolic heart failure ([place]) 07/30/2021   Hyperlipidemia 07/30/2021   Lymphedema 07/30/2021   Gait instability 06/02/2021   Primary hypertension 06/02/2021    REFERRING DIAG: R26.81 (ICD-10-CM) - Gait instability  THERAPY DIAG:  Unsteadiness on feet  Other abnormalities of gait and mobility  Muscle weakness (generalized)  Abnormality of gait and mobility  Rationale for Evaluation and Treatment Rehabilitation  PERTINENT HISTORY: Pt is a pleasant 81 y/o female referred to PT for gait instability. Pt ambulates with RW. She reports  onset of gait instability started several years ago. 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 Pt has had PT in past following surgeries. Pt reports hx of frequent falls. She has had 2 falls in past 6 months. Pt believes falls are due to difficulty picking up L foot and dragging L foot. Pt reports no injury with falls.  Pt being seen by OT for lymphedema, reports LLE more affected than RLE. Pt currently lives alone. Her spouse passed away recently in 2022-06-28. Her adult son and DIL live nearby and can assist her. Pt denies any dizziness or lightheadedness. Pt reports no pain currently. 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   PRECAUTIONS: N/A  SUBJECTIVE: Pt reports no falls. She did have on einstance in a resturaunt where her cane slipped on a wet/damp surface and she had her DTR in law was able to provide CGA for support.   PAIN:  Are you having pain? No    TODAY'S TREATMENT 03/17/22 Rationale for Evaluation and Treatment Rehabilitation  Exercise/Activity Sets/Reps/Time/ Resistance Assistance Charge type Comments  STS  X 10; sets ; with GTB around  distal femurs Min use of hands, CGA  TA GTB to challenge hip abductors, cues for proper foot placement for optimal biomechanincal advantage for STS  Cues for eccentric control, education regarding benefits of eccentric strength  Ambulation practice with patient's personal SPC X170 feet CGA and SPC TA Cues for sequencing to and through   [place] between two cones for figure 8 with SPC  X4 trials  CGA TA   Box walking with SPC ( anterior lateral and retro waling)  X 4 laps, cones approx 8 feet apart CGA TA Co improve cane ambulation safety in various environments.   Seated clamshell L LE focus  15 x 5 sec holds GTB   TE   Lateral sidestepping to right and left with resistance column 2 laps x  7.5#  NMR Uses SPC for balance, cues for proper side step.  Rest break between sets        PATIENT EDUCATION: Education details: Patient educated extensively regarding proper utilization of cane as well as demonstration of why proper use of cane will improve her ability to ambulate independently Person educated: Patient Education method: Explanation, Demonstration, and Verbal cues Education comprehension: verbalized understanding, returned demonstration, verbal cues required, and needs further education   HOME EXERCISE PROGRAM:  no updates as of 12/17/21 08/04/2021: Access Code: [email or web address] 09/28/2021 Access Code: [email or web address] 10/28/2021 Access Code: [email or web address]   8/14/22023 Access Code: [email or web address]   PT Short Term Goals      PT SHORT TERM GOAL #1   Title Pt will be independent with HEP in order to improve strength and balance in order to decrease fall risk and improve function at home and work.    Baseline 3/7: provided pt with initial HEP 4/5; compliant    Time 6    Period Weeks    Status Achieved    Target Date 09/15/21              PT Long Term Goals       PT LONG TERM GOAL #1   Title Patient will increase FOTO score to equal to or greater than 62 to demonstrate statistically significant improvement in mobility and quality of life.    Baseline 3/7: 50 4/5: 55%; 10/28/2021=53%; 8/23: 58   Time 12    Period Weeks    Status Revise    Target Date 03/17/22      PT LONG TERM GOAL #2   Title Pt will improve BERG to >49 in order to demonstrate clinically significant improvement in balance.    Baseline 3/7: 40/56 4/5: 44/56; 01/20/22: 47 02/16/22: ongoing    Time 12    Period Weeks    Status Revise    Target Date 03/17/22      PT LONG TERM GOAL #3   Title Pt will decrease 5TSTS by at least 3 seconds in order to demonstrate clinically significant improvement in LE strength.    Baseline 3/7: 15.36 sec with use of BUEs to assist 4/5: 9.6 seconds SUE support    Time 12    Period Weeks     Status Achieved    Target Date 03/17/22      PT LONG TERM GOAL #4   Title Pt will improve ABC by at least 13% in order to demonstrate clinically significant improvement in balance confidence.    Baseline 3/7: 50.63% 4/5: 62% 5/31= 63%; 7/20: 70.31%   Time 12    Period Weeks    Status Achieved   Target Date 03/17/22  PT LONG TERM GOAL #5   Title Patient will increase 10 meter walk test to >1.41ms as to improve gait speed for better community ambulation and to reduce fall risk.    Baseline 3/7: 0.75 m/s with 4WW 4/5: 0.96 m/s with 4 WW; 10/28/2021= 0.97 m/s with 49DG 7/20: 0.96 m/s with 4WW   Time 12    Period Weeks    Status Partially Met    Target Date 03/17/22        Additional Long Term Goals   Additional Long Term Goals Yes      PT LONG TERM GOAL #6   Title Patient will demonstrate improved balance by exhibiting ability to bowler stance (wide tandem) bilat without UE support for >60sec to demonstrate improved frontal plane postural control.    Baseline 01/20/22: 30sec bilat, partail hand support to perform on Rt side only    Time 12    Period Weeks    Status Revised    Target Date 03/17/22        PT LONG TERM GOAL #7   Title Patient will present with improved right knee AROM <10 deg knee ext from zero for decreased limp and leg length discrepancy to aide in walking and weight shifting.    Baseline 10/28/2021= 15 deg from zero (right knee ext) ; 7/20: 3 deg from zero    Time 12    Period Weeks    Status Achieved    Target Date 03/17/22       PT LONG TERM GOAL #8   Title Patient will demo modified Ind floor to sit transfer using chair for improved ability to get up off the floor for decreased caregiver or EMS Support.    Baseline 10/28/2021= Not formally assessed yet patient reports difficulty and desire to practice to be able to perform on her own.; 7/20: CGA and min VC/demo to complete floor to stand transfer, use of chair for UE support 9/19: able to complete floor  to and from  stand transfer with min cueing   Time 12    Period Weeks    Status Ongoing     Target Date 03/17/22             Plan     Clinical Impression Statement Patient presents with excellent motivation for completion of physical therapy activities.  Patient continues with progression and ambulating with SPC as well as with frontal plane and hip muscle strengthening. Pt making good progress with functional cane ambulation but still has difficulty in unfamiliar environments. Pt due for recert note next session, would like to focus on ambulation with cane safely in more environments as goal. Patient continues to make progress at this time will continue to benefit from skilled physical therapy to improve her lower extremity strength, mobility, and quality of life.    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, B THA x2, diastolic heart failure, back pain, tachycardia    Examination-Activity Limitations Bend;Reach Overhead;Stairs;Transfers;Lift;Squat;Locomotion Level;Carry;Stand;Dressing    Examination-Participation Restrictions Volunteer;Community Activity;Shop;Cleaning;Laundry;Yard Work    SMerchant navy officerEvolving/Moderate complexity    Rehab Potential Good    PT Frequency 2x / week    PT Duration 12 weeks    PT Treatment/Interventions ADLs/Self Care Home Management;Aquatic Therapy;Biofeedback;Canalith Repostioning;Cryotherapy;Electrical Stimulation;Moist Heat;Traction;Ultrasound;DME Instruction;Gait training;Functional mBiochemist, clinicalTherapeutic activities;Therapeutic exercise;Balance training;Neuromuscular re-education;Patient/family education;Orthotic Fit/Training;Wheelchair mobility training;Manual techniques;Passive range of motion;Scar mobilization;Dry needling;Energy  conservation;Splinting;Taping;Vestibular;Visual/perceptual remediation/compensation;Joint Manipulations  PT Next Visit Plan Heavy front plane emphasis on hip strategy and ABDCT strength in functional context, cane practice and functional stability training        Consulted and Agree with Plan of Care Patient            Particia Lather PT  1:45 PM,03/17/22

## 2022-03-18 ENCOUNTER — Other Ambulatory Visit: Payer: Self-pay | Admitting: Family Medicine

## 2022-03-22 ENCOUNTER — Encounter: Payer: Medicare Other | Admitting: Occupational Therapy

## 2022-03-22 ENCOUNTER — Ambulatory Visit: Payer: Medicare Other

## 2022-03-22 DIAGNOSIS — R2689 Other abnormalities of gait and mobility: Secondary | ICD-10-CM

## 2022-03-22 DIAGNOSIS — R278 Other lack of coordination: Secondary | ICD-10-CM

## 2022-03-22 DIAGNOSIS — R262 Difficulty in walking, not elsewhere classified: Secondary | ICD-10-CM

## 2022-03-22 DIAGNOSIS — M6281 Muscle weakness (generalized): Secondary | ICD-10-CM

## 2022-03-22 DIAGNOSIS — R269 Unspecified abnormalities of gait and mobility: Secondary | ICD-10-CM

## 2022-03-22 DIAGNOSIS — R2681 Unsteadiness on feet: Secondary | ICD-10-CM | POA: Diagnosis not present

## 2022-03-22 NOTE — Therapy (Addendum)
OUTPATIENT PHYSICAL THERAPY TREATMENT NOTE    Patient Name: Mary [name] MRN: [id number] DOB:10-15-1940, 81 y.o., female Today's Date: 03/22/2022  PCP: Valerie Roys, DO REFERRING PROVIDER: Minna Merritts, MD   PT End of Session - 03/22/22 1532     Visit Number 48    Number of Visits 63    Date for PT Re-Evaluation 04/14/22    Authorization Type Medicare Primary, A[employer] Secondary    Authorization Time Period 01/20/22- 04/14/22   Progress Note Due on Visit 82    PT Start Time 1522    PT Stop Time 1557    PT Time Calculation (min) 35 min    Equipment Utilized During Treatment Gait belt    Activity Tolerance Patient tolerated treatment well;No increased pain    Behavior During Therapy WFL for tasks assessed/performed                Past Medical History:  Diagnosis Date   Arrhythmia    Back pain    Depression with anxiety    Hip pain    Hyperlipidemia    Hypertension    Neuromuscular disorder (HCC)    Paroxysmal tachycardia ([place])    Past Surgical History:  Procedure Laterality Date   ABDOMINAL HYSTERECTOMY     BREAST BIOPSY     BREAST EXCISIONAL BIOPSY Left 2002   benign   CARDIAC CATHETERIZATION     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   x2   TOTAL HIP ARTHROPLASTY Bilateral    done twice   Patient Active Problem List   Diagnosis Date Noted   Insomnia 12/16/2021   Depression, recurrent ([place]) 56/31/4970   Diastolic heart failure ([place]) 07/30/2021   Hyperlipidemia 07/30/2021   Lymphedema 07/30/2021   Gait instability 06/02/2021   Primary hypertension 06/02/2021    REFERRING DIAG: R26.81 (ICD-10-CM) - Gait instability  THERAPY DIAG:  Unsteadiness on feet  Other abnormalities of gait and mobility  Muscle weakness (generalized)  Abnormality of gait and mobility  Difficulty in walking, not elsewhere classified  Other lack of coordination  Rationale for Evaluation and Treatment Rehabilitation  PERTINENT HISTORY: Pt is a pleasant 81 y/o female referred  to PT for gait instability. Pt ambulates with RW. She reports onset of gait instability started several years ago. 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 Pt has had PT in past following surgeries. Pt reports hx of frequent falls. She has had 2 falls in past 6 months. Pt believes falls are due to difficulty picking up L foot and dragging L foot. Pt reports no injury with falls.  Pt being seen by OT for lymphedema, reports LLE more affected than RLE. Pt currently lives alone. Her spouse passed away recently in 21-Jul-2022. Her adult son and DIL live nearby and can assist her. Pt denies any dizziness or lightheadedness. Pt reports no pain currently. 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   PRECAUTIONS: N/A  SUBJECTIVE: Doing well in general. Still working on wlaking in th ehouse with SPC most of time. RW used at night for BR trips.      PAIN:  Are you having pain? No    TODAY'S TREATMENT 03/22/22 Rationale for Treatment Rehabilitation  -STS, hands free, greenTB at knees 1x10 -Marching 1x10 bilat  -seated dead lift 1x15  -STS, hands free, greenTB at knees 1x10 -Marching  1x10 bilat  -SPC gait training 1x1100f through hallway  -fatigue in Rt hip by end walking   FOTO survey: 58        PATIENT EDUCATION: Education details: Patient educated extensively regarding proper utilization of cane as well as demonstration of why proper use of cane will improve her ability to ambulate independently Person educated: Patient Education method: Explanation, Demonstration, and Verbal cues Education comprehension: verbalized understanding, returned demonstration, verbal cues required, and needs further education   HOME EXERCISE PROGRAM:  no updates as of 12/17/21 08/04/2021: Access Code: [email or web address] 09/28/2021 Access Code: [email or web address] 10/28/2021 Access Code:  [email or web address]  8/14/22023 Access Code: [email or web address]  PT Short Term Goals      PT SHORT TERM GOAL #1   Title Pt will be independent with HEP in order to improve strength and balance in order to decrease fall risk and improve function at home and work.    Baseline 3/7: provided pt with initial HEP 4/5; compliant    Time 6    Period Weeks    Status Achieved    Target Date 09/15/21              PT Long Term Goals       PT LONG TERM GOAL #1   Title Patient will increase FOTO score to equal to or greater than 62 to demonstrate statistically significant improvement in mobility and quality of life.    Baseline 3/7: 50 4/5: 55%; 10/28/2021=53%; 8/23: 58   Time 12    Period Weeks    Status Revised    Target Date 04/14/22      PT LONG TERM GOAL #2   Title Pt will improve BERG to >49 in order to demonstrate clinically significant improvement in balance.    Baseline 3/7: 40/56 4/5: 44/56; 01/20/22: 47   Time 12    Period Weeks    Status Revised    Target Date 04/14/22       PT LONG TERM GOAL #3   Title Patient will increase 10 meter walk test to >1.064m as to improve gait speed for better community ambulation and to reduce fall risk.    Baseline 3/7: 0.75 m/s with 4WW 4/5: 0.96 m/s with 4 WW; 10/28/2021= 0.97 m/s with [id number]: 0.96 m/s with 4WW   Time 12    Period Weeks    Status Partially Met    Target Date 04/14/22        PT LONG TERM GOAL #4   Title Patient will demonstrate improved balance by exhibiting ability to bowler stance (wide tandem) bilat without UE support for >60sec to demonstrate improved frontal plane postural control.    Baseline 01/20/22: 30sec bilat, partail hand support to perform on Rt side only    Time 12    Period Weeks    Status Revised    Target Date 04/14/22       PT LONG TERM GOAL #5   Title Patient will demo modified Ind floor to sit transfer using chair for improved ability to get up off the floor for decreased caregiver or EMS Support.    Baseline  10/28/2021= Not formally assessed yet patient reports difficulty and desire to practice to be able to perform on her own.; 7/20: CGA and min VC/demo to complete floor to stand transfer, use of chair for UE support   Time 12    Period Weeks    Status Ongoing     Target Date 04/14/22  Plan     Clinical Impression Statement Continued to work on strength, gross motor control training, and overground AMB. Pt tolerates well in general, communicates rest needs well. Patient continues to make progress at this time will continue to benefit from skilled physical therapy to improve her lower extremity strength, mobility, and quality of life.   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, B THA x2, diastolic heart failure, back pain, tachycardia    Examination-Activity Limitations Bend;Reach Overhead;Stairs;Transfers;Lift;Squat;Locomotion Level;Carry;Stand;Dressing    Examination-Participation Restrictions Volunteer;Community Activity;Shop;Cleaning;Laundry;Yard Work    Merchant navy officer Evolving/Moderate complexity    Rehab Potential Good    PT Frequency 2x / week    PT Duration 12 weeks    PT Treatment/Interventions ADLs/Self Care Home Management;Aquatic Therapy;Biofeedback;Canalith Repostioning;Cryotherapy;Electrical Stimulation;Moist Heat;Traction;Ultrasound;DME Instruction;Gait training;Functional Biochemist, clinical;Therapeutic activities;Therapeutic exercise;Balance training;Neuromuscular re-education;Patient/family education;Orthotic Fit/Training;Wheelchair mobility training;Manual techniques;Passive range of motion;Scar mobilization;Dry needling;Energy conservation;Splinting;Taping;Vestibular;Visual/perceptual remediation/compensation;Joint Manipulations    PT Next Visit Plan Heavy front plane emphasis on hip strategy and  ABDCT strength in functional context, cane practice and functional stability training        Consulted and Agree with Plan of Care Patient            3:40 PM, 03/22/22 Etta Grandchild, PT, DPT Physical Therapist - [place] [phone]     Etta Grandchild PT  3:39 PM,03/22/22

## 2022-03-23 ENCOUNTER — Telehealth: Payer: Self-pay | Admitting: Cardiovascular Disease

## 2022-03-23 NOTE — Addendum Note (Signed)
Addended by: Etta Grandchild on: 03/23/2022 09:37 AM   Modules accepted: Orders

## 2022-03-23 NOTE — Telephone Encounter (Signed)
Order for [facility] Referral- Comprehensive Lymphedema Program received and signed by Dr. Rockey Situ.  Faxed back to [facility] at [phone]. Confirmation received.

## 2022-03-24 ENCOUNTER — Ambulatory Visit: Payer: Medicare Other

## 2022-03-24 DIAGNOSIS — R278 Other lack of coordination: Secondary | ICD-10-CM

## 2022-03-24 DIAGNOSIS — M6281 Muscle weakness (generalized): Secondary | ICD-10-CM

## 2022-03-24 DIAGNOSIS — R2681 Unsteadiness on feet: Secondary | ICD-10-CM | POA: Diagnosis not present

## 2022-03-24 DIAGNOSIS — R269 Unspecified abnormalities of gait and mobility: Secondary | ICD-10-CM

## 2022-03-24 DIAGNOSIS — R262 Difficulty in walking, not elsewhere classified: Secondary | ICD-10-CM

## 2022-03-24 DIAGNOSIS — R2689 Other abnormalities of gait and mobility: Secondary | ICD-10-CM

## 2022-03-24 NOTE — Therapy (Signed)
OUTPATIENT PHYSICAL THERAPY TREATMENT NOTE    Patient Name: Mary [name] MRN: [id number] DOB:1941/02/06, 81 y.o., female Today's Date: 03/24/2022  PCP: Valerie Roys, DO REFERRING PROVIDER: Minna Merritts, MD   PT End of Session - 03/24/22 1308     Visit Number 9    Number of Visits 15    Date for PT Re-Evaluation 04/14/22    Authorization Type Medicare Primary, [employer] Secondary    Authorization Time Period 01/20/22-04/14/22    Progress Note Due on Visit 14    PT Start Time 1307    PT Stop Time 1345    PT Time Calculation (min) 38 min    Equipment Utilized During Treatment Gait belt    Activity Tolerance Patient tolerated treatment well;No increased pain    Behavior During Therapy WFL for tasks assessed/performed              Past Medical History:  Diagnosis Date   Arrhythmia    Back pain    Depression with anxiety    Hip pain    Hyperlipidemia    Hypertension    Neuromuscular disorder (HCC)    Paroxysmal tachycardia ([place])    Past Surgical History:  Procedure Laterality Date   ABDOMINAL HYSTERECTOMY     BREAST BIOPSY     BREAST EXCISIONAL BIOPSY Left 2002   benign   CARDIAC CATHETERIZATION     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   x2   TOTAL HIP ARTHROPLASTY Bilateral    done twice   Patient Active Problem List   Diagnosis Date Noted   Insomnia 12/16/2021   Depression, recurrent ([place]) 82/50/5397   Diastolic heart failure ([place]) 07/30/2021   Hyperlipidemia 07/30/2021   Lymphedema 07/30/2021   Gait instability 06/02/2021   Primary hypertension 06/02/2021    REFERRING DIAG: R26.81 (ICD-10-CM) - Gait instability  THERAPY DIAG:  Difficulty in walking, not elsewhere classified  Muscle weakness (generalized)  Other abnormalities of gait and mobility  Unsteadiness on feet  Abnormality of gait and mobility  Other lack of coordination  Rationale for Evaluation and Treatment Rehabilitation  PERTINENT HISTORY: Pt is a pleasant 81 y/o female referred to  PT for gait instability. Pt ambulates with RW. She reports onset of gait instability started several years ago. 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 Pt has had PT in past following surgeries. Pt reports hx of frequent falls. She has had 2 falls in past 6 months. Pt believes falls are due to difficulty picking up L foot and dragging L foot. Pt reports no injury with falls.  Pt being seen by OT for lymphedema, reports LLE more affected than RLE. Pt currently lives alone. Her spouse passed away recently in 06-29-22. Her adult son and DIL live nearby and can assist her. Pt denies any dizziness or lightheadedness. Pt reports no pain currently. 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   PRECAUTIONS: N/A  SUBJECTIVE:  Pt denies any falls since last visit.  Pt denies having any pain.     PAIN:  Are you having pain? No   TODAY'S TREATMENT 03/24/22  Rationale for Evaluation and Treatment Rehabilitation  Exercise/Activity Sets/Reps/Time/ Resistance Assistance Charge type Comments  STS  x 10; sets ; with GTB around distal femurs Min use of hands, CGA  TA GTB to challenge hip abductors, cues for proper  foot placement for optimal biomechanincal advantage for STS  Cues for eccentric control, education regarding benefits of eccentric strength  Ambulation practice with patient's personal SPC x340 feet CGA and SPC TA Cues for sequencing to and through   Box walking in numbered squares (anterior/lateral/retro/crossed walking) with use of SPC Multiple bouts CGA TA Pt unable to take large amplitude steps, and has difficulty navigating retro steps with use of the cane  Seated clamshell L LE focus  15 x 5 sec holds GTB  TE   Seated hip adduction into rainbow physioball x10, 5 sec holds  TE Verbal cuing for 5 sec holds  Seated LAQ with hip adduction into rainbow  physioball x10 each LE with holds of adduction  TE Verbal cuing for holding while performing and slow eccentric contractrion       PATIENT EDUCATION: Education details: Patient educated extensively regarding proper utilization of cane as well as demonstration of why proper use of cane will improve her ability to ambulate independently Person educated: Patient Education method: Explanation, Demonstration, and Verbal cues Education comprehension: verbalized understanding, returned demonstration, verbal cues required, and needs further education   HOME EXERCISE PROGRAM:  no updates as of 12/17/21 08/04/2021: Access Code: [email or web address] 09/28/2021 Access Code: [email or web address] 10/28/2021 Access Code: [email or web address]   01/11/2022 Access Code: [email or web address]   PT Short Term Goals      PT SHORT TERM GOAL #1   Title Pt will be independent with HEP in order to improve strength and balance in order to decrease fall risk and improve function at home and work.    Baseline 3/7: provided pt with initial HEP 4/5; compliant    Time 6    Period Weeks    Status Achieved    Target Date 09/15/21              PT Long Term Goals       PT LONG TERM GOAL #1   Title Patient will increase FOTO score to equal to or greater than 62 to demonstrate statistically significant improvement in mobility and quality of life.    Baseline 3/7: 50 4/5: 55%; 10/28/2021=53%; 8/23: 58   Time 12    Period Weeks    Status Revise    Target Date 03/17/22      PT LONG TERM GOAL #2   Title Pt will improve BERG to >49 in order to demonstrate clinically significant improvement in balance.    Baseline 3/7: 40/56 4/5: 44/56; 01/20/22: 47 02/16/22: ongoing    Time 12    Period Weeks    Status Revise    Target Date 03/17/22      PT LONG TERM GOAL #3   Title Pt will decrease 5TSTS by at least 3 seconds in order to demonstrate clinically significant improvement in LE strength.    Baseline 3/7: 15.36 sec with use of BUEs to assist 4/5: 9.6 seconds SUE  support    Time 12    Period Weeks    Status Achieved    Target Date 03/17/22      PT LONG TERM GOAL #4   Title Pt will improve ABC by at least 13% in order to demonstrate clinically significant improvement in balance confidence.    Baseline 3/7: 50.63% 4/5: 62% 5/31= 63%; 7/20: 70.31%   Time 12    Period Weeks    Status Achieved   Target Date 03/17/22      PT LONG TERM GOAL #5   Title Patient will increase 10 meter  walk test to >1.82ms as to improve gait speed for better community ambulation and to reduce fall risk.    Baseline 3/7: 0.75 m/s with 4WW 4/5: 0.96 m/s with 4 WW; 10/28/2021= 0.97 m/s with 40FE 7/20: 0.96 m/s with 4WW   Time 12    Period Weeks    Status Partially Met    Target Date 03/17/22        Additional Long Term Goals   Additional Long Term Goals Yes      PT LONG TERM GOAL #6   Title Patient will demonstrate improved balance by exhibiting ability to bowler stance (wide tandem) bilat without UE support for >60sec to demonstrate improved frontal plane postural control.    Baseline 01/20/22: 30sec bilat, partail hand support to perform on Rt side only    Time 12    Period Weeks    Status Revised    Target Date 03/17/22        PT LONG TERM GOAL #7   Title Patient will present with improved right knee AROM <10 deg knee ext from zero for decreased limp and leg length discrepancy to aide in walking and weight shifting.    Baseline 10/28/2021= 15 deg from zero (right knee ext) ; 7/20: 3 deg from zero    Time 12    Period Weeks    Status Achieved    Target Date 03/17/22       PT LONG TERM GOAL #8   Title Patient will demo modified Ind floor to sit transfer using chair for improved ability to get up off the floor for decreased caregiver or EMS Support.    Baseline 10/28/2021= Not formally assessed yet patient reports difficulty and desire to practice to be able to perform on her own.; 7/20: CGA and min VC/demo to complete floor to stand transfer, use of chair for UE  support 9/19: able to complete floor to and from  stand transfer with min cueing   Time 12    Period Weeks    Status Ongoing     Target Date 03/17/22             Plan     Clinical Impression Statement Pt put forth good effort throughout treatment session today.  Session was limited due to pt arriving to the clinic late.  Pt is making significant improvements with gait utilizing the SPlessen Eye LLCand will continue to perform during therapy in order return more independent ambulation with greater degrees of freedom.  Pt will also benefit from continued muscular strengthening in order for pt to improve stability during ambulation.   Pt will continue to benefit from skilled therapy to address remaining deficits in order to improve overall QoL and return to PLOF.       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, B THA x2, diastolic heart failure, back pain, tachycardia    Examination-Activity Limitations Bend;Reach Overhead;Stairs;Transfers;Lift;Squat;Locomotion Level;Carry;Stand;Dressing    Examination-Participation Restrictions Volunteer;Community Activity;Shop;Cleaning;Laundry;Yard Work    SMerchant navy officerEvolving/Moderate complexity    Rehab Potential Good    PT Frequency 2x / week    PT Duration 12 weeks    PT Treatment/Interventions ADLs/Self Care Home Management;Aquatic Therapy;Biofeedback;Canalith Repostioning;Cryotherapy;Electrical Stimulation;Moist Heat;Traction;Ultrasound;DME Instruction;Gait training;Functional mBiochemist, clinicalTherapeutic activities;Therapeutic exercise;Balance training;Neuromuscular re-education;Patient/family education;Orthotic Fit/Training;Wheelchair mobility training;Manual techniques;Passive range of motion;Scar mobilization;Dry needling;Energy conservation;Splinting;Taping;Vestibular;Visual/perceptual  remediation/compensation;Joint Manipulations    PT Next Visit Plan Heavy front plane emphasis on hip strategy  and ABDCT strength in functional context, cane practice and functional stability training        Consulted and Agree with Plan of Care Patient            Gwenlyn Saran, PT, DPT Physical Therapist- [facility]  03/24/22, 3:04 PM

## 2022-03-29 ENCOUNTER — Ambulatory Visit: Payer: Medicare Other

## 2022-03-29 ENCOUNTER — Encounter: Payer: Medicare Other | Admitting: Occupational Therapy

## 2022-03-29 DIAGNOSIS — R2681 Unsteadiness on feet: Secondary | ICD-10-CM

## 2022-03-29 DIAGNOSIS — M6281 Muscle weakness (generalized): Secondary | ICD-10-CM

## 2022-03-29 DIAGNOSIS — R262 Difficulty in walking, not elsewhere classified: Secondary | ICD-10-CM

## 2022-03-29 DIAGNOSIS — R2689 Other abnormalities of gait and mobility: Secondary | ICD-10-CM

## 2022-03-29 DIAGNOSIS — R269 Unspecified abnormalities of gait and mobility: Secondary | ICD-10-CM

## 2022-03-29 NOTE — Therapy (Signed)
OUTPATIENT PHYSICAL THERAPY TREATMENT NOTE/ Progress Note Physical Therapy Progress Note   Dates of reporting period  02/16/22   to   03/29/22     Patient Name: Mary [name] MRN: [id number] DOB:1940/11/09, 81 y.o., female Today's Date: 03/29/2022  PCP: Valerie Roys, DO REFERRING PROVIDER: Minna Merritts, MD   PT End of Session - 03/29/22 1526     Visit Number 50    Number of Visits 47    Date for PT Re-Evaluation 04/14/22    Authorization Type Medicare Primary, A[employer] Secondary    Authorization Time Period 01/20/22-04/14/22    Progress Note Due on Visit 80    PT Start Time 1525    PT Stop Time 1555    PT Time Calculation (min) 30 min    Equipment Utilized During Treatment Gait belt    Activity Tolerance Patient tolerated treatment well;No increased pain    Behavior During Therapy WFL for tasks assessed/performed              Past Medical History:  Diagnosis Date   Arrhythmia    Back pain    Depression with anxiety    Hip pain    Hyperlipidemia    Hypertension    Neuromuscular disorder (HCC)    Paroxysmal tachycardia ([place])    Past Surgical History:  Procedure Laterality Date   ABDOMINAL HYSTERECTOMY     BREAST BIOPSY     BREAST EXCISIONAL BIOPSY Left 2002   benign   CARDIAC CATHETERIZATION     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   x2   TOTAL HIP ARTHROPLASTY Bilateral    done twice   Patient Active Problem List   Diagnosis Date Noted   Insomnia 12/16/2021   Depression, recurrent ([place]) 18/29/9371   Diastolic heart failure ([place]) 07/30/2021   Hyperlipidemia 07/30/2021   Lymphedema 07/30/2021   Gait instability 06/02/2021   Primary hypertension 06/02/2021    REFERRING DIAG: R26.81 (ICD-10-CM) - Gait instability  THERAPY DIAG:  Difficulty in walking, not elsewhere classified  Muscle weakness (generalized)  Other abnormalities of gait and mobility  Unsteadiness on feet  Abnormality of gait and mobility  Rationale for Evaluation and Treatment  Rehabilitation  PERTINENT HISTORY: Pt is a pleasant 81 y/o female referred to PT for gait instability. Pt ambulates with RW. She reports onset of gait instability started several years ago. 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 Pt has had PT in past following surgeries. Pt reports hx of frequent falls. She has had 2 falls in past 6 months. Pt believes falls are due to difficulty picking up L foot and dragging L foot. Pt reports no injury with falls.  Pt being seen by OT for lymphedema, reports LLE more affected than RLE. Pt currently lives alone. Her spouse passed away recently in 07-02-22. Her adult son and DIL live nearby and can assist her. Pt denies any dizziness or lightheadedness. Pt reports no pain currently. 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   PRECAUTIONS: N/A  SUBJECTIVE:  Pt doing  ok today. Her hip continues to hurt quite a bit, she thinks related to how she's getting in/out of bed.   PAIN:  Are you having pain? Yes; 5-6/10 hip pain.    TODAY'S TREATMENT 03/29/22  FLOOR TO SITTING TRAINING FOR FALLS RECOVERY  *performed on wide treatment table  1st attempt: pt lead technique  -sitting to supine -supine to long sitting (able but limited by posterior chain tightness in end-position) -supine to prone -prone to quadruped  -quadruped  crawling forward and backward x59f each -quadruped to quadruped with BUE on chair with forehead on chair transition (very labored and near failure) -quadruped arms on chair with transtion from bilat knees to bilat feet into modified downdog -modified down dog to partial walkin and then 180 degree rotation to sitting  2nd attempt: aPryor Curiaprovides education on secondary technique  -sitting in chair to anterior lowering scoot to 7" step, then lift to chair using bridge technique onto shoulder  blades and walking it back into sitting (very labored) -lower to step again, then lowering step to plinth longsitting -longsitting to trunk rotation and sitting EOB  *discussed obtaining a step stool for 2nd technique as a backup technique  AMB from table to // bars with SPC  -lateral side stepping in // bars, no resistance, supervision level, hands on bar for intermittent touch only.      Rationale for Evaluation and Treatment Rehabilitation     PATIENT EDUCATION: Education details: Patient educated extensively regarding proper utilization of cane as well as demonstration of why proper use of cane will improve her ability to ambulate independently Person educated: Patient Education method: Explanation, Demonstration, and Verbal cues Education comprehension: verbalized understanding, returned demonstration, verbal cues required, and needs further education   HOME EXERCISE PROGRAM:  no updates as of 12/17/21 08/04/2021: Access Code: [email or web address] 09/28/2021 Access Code: [email or web address] 10/28/2021 Access Code: [email or web address]  01/11/2022 Access Code: [email or web address]  PT Short Term Goals      PT SHORT TERM GOAL #1   Title Pt will be independent with HEP in order to improve strength and balance in order to decrease fall risk and improve function at home and work.    Baseline 3/7: provided pt with initial HEP 4/5; compliant    Time 6    Period Weeks    Status Achieved    Target Date 09/15/21              PT Long Term Goals       PT LONG TERM GOAL #1   Title Patient will increase FOTO score to equal to or greater than 62 to demonstrate statistically significant improvement in mobility and quality of life.    Baseline 3/7: 50 4/5: 55%; 10/28/2021=53%; 8/23: 58; 10/23: 58   Time 12    Period Weeks    Status Revised    Target Date 03/17/22      PT LONG TERM GOAL #2   Title Pt will improve BERG to >49 in order to demonstrate clinically significant improvement in balance.    Baseline 3/7: 40/56 4/5:  44/56; 01/20/22: 47 02/16/22: ongoing    Time 12    Period Weeks    Status Revised   Target Date 03/17/22      PT LONG TERM GOAL #3   Title Pt will decrease 5TSTS by at least 3 seconds in order to demonstrate clinically significant improvement in LE strength.    Baseline 3/7: 15.36 sec with use of BUEs to assist 4/5: 9.6 seconds SUE support    Time 12    Period Weeks    Status Achieved    Target Date 03/17/22      PT LONG TERM GOAL #4   Title Pt will improve ABC by at least 13% in order to demonstrate clinically significant improvement in  balance confidence.    Baseline 3/7: 50.63% 4/5: 62% 5/31= 63%; 7/20: 70.31%   Time 12    Period Weeks    Status Achieved   Target Date 03/17/22      PT LONG TERM GOAL #5   Title Patient will increase 10 meter walk test to >1.64ms as to improve gait speed for better community ambulation and to reduce fall risk.    Baseline 3/7: 0.75 m/s with 4WW 4/5: 0.96 m/s with 4 WW; 10/28/2021= 0.97 m/s with 48GQ 7/20: 0.96 m/s with 4WW   Time 12    Period Weeks    Status Partially Met    Target Date 03/17/22          PT LONG TERM GOAL #6   Title Patient will demonstrate improved balance by exhibiting ability to bowler stance (wide tandem) bilat without UE support for >60sec to demonstrate improved frontal plane postural control.    Baseline 01/20/22: 30sec bilat, partail hand support to perform on Rt side only    Time 12    Period Weeks    Status Revised    Target Date 03/17/22        PT LONG TERM GOAL #7   Title Patient will present with improved right knee AROM <10 deg knee ext from zero for decreased limp and leg length discrepancy to aide in walking and weight shifting.    Baseline 10/28/2021= 15 deg from zero (right knee ext) ; 7/20: 3 deg from zero    Time 12    Period Weeks    Status Achieved    Target Date 03/17/22       PT LONG TERM GOAL #8   Title Patient will demo modified Ind floor to sit transfer using chair for improved ability to get  up off the floor for decreased caregiver or EMS Support.    Baseline 10/28/2021= Not formally assessed yet patient reports difficulty and desire to practice to be able to perform on her own.; 7/20: CGA and min VC/demo to complete floor to stand transfer, use of chair for UE support 9/19: able to complete floor to and from  stand transfer with min cueing; 10/30: reviewed 2 techniques, requires max effort and optimal setup/equipment   Time 12    Period Weeks    Status achieved     Target Date 03/17/22             Plan     Clinical Impression Statement Progress note. Pt has met many goals of treatment. Outcome measures remain similar to prior assessments. Pt has progressed in AMB potential, able to use a variety of devices, however lateral instability of the hip precludes further progression of gait progress. Pt remains motivated to advance her mobility as best she is able.  Pt will continue to benefit from skilled therapy to address remaining deficits in order to improve overall QoL and return to PLOF.     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, B THA x2, diastolic heart failure, back pain, tachycardia    Examination-Activity Limitations Bend;Reach Overhead;Stairs;Transfers;Lift;Squat;Locomotion Level;Carry;Stand;Dressing    Examination-Participation Restrictions Volunteer;Community Activity;Shop;Cleaning;Laundry;Yard Work    SMerchant navy officerEvolving/Moderate complexity    Rehab Potential Good    PT Frequency 2x / week    PT Duration 12 weeks    PT Treatment/Interventions ADLs/Self Care Home Management;Aquatic Therapy;Biofeedback;Canalith Repostioning;Cryotherapy;Electrical Stimulation;Moist Heat;Traction;Ultrasound;DME Instruction;Gait training;Functional mBiochemist, clinicalTherapeutic activities;Therapeutic exercise;Balance  training;Neuromuscular re-education;Patient/family education;Orthotic Fit/Training;Wheelchair mobility training;Manual techniques;Passive range of motion;Scar mobilization;Dry needling;Energy conservation;Splinting;Taping;Vestibular;Visual/perceptual remediation/compensation;Joint Manipulations    PT Next Visit Plan Update goals, create a path for DC        Consulted and Agree with Plan of Care Patient           3:29 PM, 03/29/22 Etta Grandchild, PT, DPT Physical Therapist - [facility]  [phone] ([place])    03/29/22, 3:29 PM

## 2022-03-31 ENCOUNTER — Ambulatory Visit: Payer: Medicare Other | Attending: Cardiovascular Disease

## 2022-03-31 DIAGNOSIS — R262 Difficulty in walking, not elsewhere classified: Secondary | ICD-10-CM | POA: Insufficient documentation

## 2022-03-31 DIAGNOSIS — R269 Unspecified abnormalities of gait and mobility: Secondary | ICD-10-CM | POA: Diagnosis present

## 2022-03-31 DIAGNOSIS — R2681 Unsteadiness on feet: Secondary | ICD-10-CM | POA: Diagnosis present

## 2022-03-31 DIAGNOSIS — R2689 Other abnormalities of gait and mobility: Secondary | ICD-10-CM | POA: Diagnosis present

## 2022-03-31 DIAGNOSIS — M6281 Muscle weakness (generalized): Secondary | ICD-10-CM | POA: Insufficient documentation

## 2022-03-31 DIAGNOSIS — I890 Lymphedema, not elsewhere classified: Secondary | ICD-10-CM | POA: Diagnosis present

## 2022-03-31 NOTE — Therapy (Signed)
OUTPATIENT PHYSICAL THERAPY TREATMENT NOTE    Patient Name: Mary [name] MRN: [id number] DOB:[birth year], 81 y.o., female Today's Date: 03/31/2022  PCP: Valerie Roys, DO REFERRING PROVIDER: Minna Merritts, MD   PT End of Session - 03/31/22 1310     Visit Number 51    Number of Visits 92    Date for PT Re-Evaluation 04/14/22    Authorization Type Medicare Primary, A[employer] Secondary    Authorization Time Period 01/20/22-04/14/22    Progress Note Due on Visit 16    PT Start Time 1308    PT Stop Time 1345    PT Time Calculation (min) 37 min    Equipment Utilized During Treatment Gait belt    Activity Tolerance Patient tolerated treatment well;No increased pain    Behavior During Therapy WFL for tasks assessed/performed               Past Medical History:  Diagnosis Date   Arrhythmia    Back pain    Depression with anxiety    Hip pain    Hyperlipidemia    Hypertension    Neuromuscular disorder (HCC)    Paroxysmal tachycardia ([place])    Past Surgical History:  Procedure Laterality Date   ABDOMINAL HYSTERECTOMY     BREAST BIOPSY     BREAST EXCISIONAL BIOPSY Left 2002   benign   CARDIAC CATHETERIZATION     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   x2   TOTAL HIP ARTHROPLASTY Bilateral    done twice   Patient Active Problem List   Diagnosis Date Noted   Insomnia 12/16/2021   Depression, recurrent ([place]) 86/57/8469   Diastolic heart failure ([place]) 07/30/2021   Hyperlipidemia 07/30/2021   Lymphedema 07/30/2021   Gait instability 06/02/2021   Primary hypertension 06/02/2021    REFERRING DIAG: R26.81 (ICD-10-CM) - Gait instability  THERAPY DIAG:  Difficulty in walking, not elsewhere classified  Muscle weakness (generalized)  Other abnormalities of gait and mobility  Rationale for Evaluation and Treatment Rehabilitation  PERTINENT HISTORY: Pt is a pleasant 81 y/o female referred to PT for gait instability. Pt ambulates with RW. She reports onset of gait instability  started several years ago. 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 Pt has had PT in past following surgeries. Pt reports hx of frequent falls. She has had 2 falls in past 6 months. Pt believes falls are due to difficulty picking up L foot and dragging L foot. Pt reports no injury with falls.  Pt being seen by OT for lymphedema, reports LLE more affected than RLE. Pt currently lives alone. Her spouse passed away recently in 06-24-22. Her adult son and DIL live nearby and can assist her. Pt denies any dizziness or lightheadedness. Pt reports no pain currently. 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   PRECAUTIONS: N/A  SUBJECTIVE:    Pt feels more confident after performing the training for falls at last session.  Pt reports she is doing well and did not have any trick-or-treaters last night.   PAIN:  Are you having pain? No   TODAY'S TREATMENT 03/31/22  Heavy front plane emphasis on hip strategy and ABDCT strength in functional context, cane practice and functional stability training    Rationale for Evaluation and Treatment Rehabilitation  Exercise/Activity Sets/Reps/Time/ Resistance Assistance Charge type Comments  STS  x  15; sets ; with BTB around distal femurs Min use of hands, CGA  TA BTB to challenge hip abductors, cues for proper foot placement for optimal biomechanincal advantage for STS  Cues for eccentric control, education regarding benefits of eccentric strength  Ambulation practice with patient's personal SPC x340 feet CGA and SPC TA Cues for sequencing to and through   Seated clamshell L LE focus  15 x 5 sec holds GTB  TE   Seated hip adduction into rainbow physioball x15, 5 sec holds  TE Verbal cuing for 5 sec holds  Seated LAQ with hip adduction into rainbow physioball x15 each LE with holds of adduction  TE Verbal cuing  for holding while performing and slow eccentric contractrion       PATIENT EDUCATION: Education details: Patient educated extensively regarding proper utilization of cane as well as demonstration of why proper use of cane will improve her ability to ambulate independently Person educated: Patient Education method: Explanation, Demonstration, and Verbal cues Education comprehension: verbalized understanding, returned demonstration, verbal cues required, and needs further education   HOME EXERCISE PROGRAM:  no updates as of 12/17/21 08/04/2021: Access Code: [email or web address] 09/28/2021 Access Code: [email or web address] 10/28/2021 Access Code: [email or web address]   01/11/2022 Access Code: [email or web address]   PT Short Term Goals      PT SHORT TERM GOAL #1   Title Pt will be independent with HEP in order to improve strength and balance in order to decrease fall risk and improve function at home and work.    Baseline 3/7: provided pt with initial HEP 4/5; compliant    Time 6    Period Weeks    Status Achieved    Target Date 09/15/21              PT Long Term Goals       PT LONG TERM GOAL #1   Title Patient will increase FOTO score to equal to or greater than 62 to demonstrate statistically significant improvement in mobility and quality of life.    Baseline 3/7: 50 4/5: 55%; 10/28/2021=53%; 8/23: 58   Time 12    Period Weeks    Status Revise    Target Date 03/17/22      PT LONG TERM GOAL #2   Title Pt will improve BERG to >49 in order to demonstrate clinically significant improvement in balance.    Baseline 3/7: 40/56 4/5: 44/56; 01/20/22: 47 02/16/22: ongoing    Time 12    Period Weeks    Status Revise    Target Date 03/17/22      PT LONG TERM GOAL #3   Title Pt will decrease 5TSTS by at least 3 seconds in order to demonstrate clinically significant improvement in LE strength.    Baseline 3/7: 15.36 sec with use of BUEs to assist 4/5: 9.6 seconds SUE support    Time 12    Period Weeks    Status Achieved     Target Date 03/17/22      PT LONG TERM GOAL #4   Title Pt will improve ABC by at least 13% in order to demonstrate clinically significant improvement in balance confidence.    Baseline 3/7: 50.63% 4/5: 62% 5/31= 63%; 7/20: 70.31%   Time 12    Period Weeks    Status Achieved   Target Date 03/17/22      PT LONG TERM GOAL #5   Title Patient will increase 10 meter walk test to >1.27ms as to improve gait speed for better  community ambulation and to reduce fall risk.    Baseline 3/7: 0.75 m/s with 4WW 4/5: 0.96 m/s with 4 WW; 10/28/2021= 0.97 m/s with 7EM; 7/20: 0.96 m/s with 4WW   Time 12    Period Weeks    Status Partially Met    Target Date 03/17/22        Additional Long Term Goals   Additional Long Term Goals Yes      PT LONG TERM GOAL #6   Title Patient will demonstrate improved balance by exhibiting ability to bowler stance (wide tandem) bilat without UE support for >60sec to demonstrate improved frontal plane postural control.    Baseline 01/20/22: 30sec bilat, partail hand support to perform on Rt side only    Time 12    Period Weeks    Status Revised    Target Date 03/17/22        PT LONG TERM GOAL #7   Title Patient will present with improved right knee AROM <10 deg knee ext from zero for decreased limp and leg length discrepancy to aide in walking and weight shifting.    Baseline 10/28/2021= 15 deg from zero (right knee ext) ; 7/20: 3 deg from zero    Time 12    Period Weeks    Status Achieved    Target Date 03/17/22       PT LONG TERM GOAL #8   Title Patient will demo modified Ind floor to sit transfer using chair for improved ability to get up off the floor for decreased caregiver or EMS Support.    Baseline 10/28/2021= Not formally assessed yet patient reports difficulty and desire to practice to be able to perform on her own.; 7/20: CGA and min VC/demo to complete floor to stand transfer, use of chair for UE support 9/19: able to complete floor to and from  stand  transfer with min cueing   Time 12    Period Weeks    Status Ongoing     Target Date 03/17/22             Plan     Clinical Impression Statement Session limited due to pt arriving to the clinic late.  Pt continues to put forth good effort throughout the session.  Pt notes she is feeling much more confident in her ambulation, but reports that when she gets too "cocky" is when she typically falls.  Therefore pt is attempting to be more cognizant of her ambulation.  Pt is moving well with the SPC, with no episodes of instability, just slowed gait at this time.   Pt will continue to benefit from skilled therapy to address remaining deficits in order to improve overall QoL and return to PLOF.       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, B THA x2, diastolic heart failure, back pain, tachycardia    Examination-Activity Limitations Bend;Reach Overhead;Stairs;Transfers;Lift;Squat;Locomotion Level;Carry;Stand;Dressing    Examination-Participation Restrictions Volunteer;Community Activity;Shop;Cleaning;Laundry;Yard Work    [employer]chant [employer] officer Evolving/Moderate complexity    Rehab Potential Good    PT Frequency 2x / week    PT Duration 12 weeks    PT Treatment/Interventions ADLs/Self Care Home Management;Aquatic Therapy;Biofeedback;Canalith Repostioning;Cryotherapy;Electrical Stimulation;Moist Heat;Traction;Ultrasound;DME Instruction;Gait training;Functional Biochemist, clinical;Therapeutic activities;Therapeutic exercise;Balance training;Neuromuscular re-education;Patient/family education;Orthotic Fit/Training;Wheelchair mobility training;Manual techniques;Passive range of motion;Scar mobilization;Dry needling;Energy conservation;Splinting;Taping;Vestibular;Visual/perceptual remediation/compensation;Joint Manipulations    PT Next Visit Plan  Re-certification for next visit. Heavy front plane emphasis on  hip strategy and ABDCT strength in functional context, cane practice and functional stability training        Consulted and Agree with Plan of Care Patient            Gwenlyn Saran, PT, DPT Physical Therapist- [facility]  03/31/22, 2:32 PM

## 2022-04-05 ENCOUNTER — Encounter: Payer: Self-pay | Admitting: Physical Therapy

## 2022-04-05 ENCOUNTER — Ambulatory Visit: Payer: Medicare Other | Admitting: Occupational Therapy

## 2022-04-05 ENCOUNTER — Encounter: Payer: Medicare Other | Admitting: Occupational Therapy

## 2022-04-05 ENCOUNTER — Ambulatory Visit: Payer: Medicare Other

## 2022-04-05 ENCOUNTER — Encounter: Payer: Self-pay | Admitting: Occupational Therapy

## 2022-04-05 DIAGNOSIS — R269 Unspecified abnormalities of gait and mobility: Secondary | ICD-10-CM

## 2022-04-05 DIAGNOSIS — I890 Lymphedema, not elsewhere classified: Secondary | ICD-10-CM

## 2022-04-05 DIAGNOSIS — M6281 Muscle weakness (generalized): Secondary | ICD-10-CM

## 2022-04-05 DIAGNOSIS — R2681 Unsteadiness on feet: Secondary | ICD-10-CM

## 2022-04-05 DIAGNOSIS — R2689 Other abnormalities of gait and mobility: Secondary | ICD-10-CM

## 2022-04-05 DIAGNOSIS — R262 Difficulty in walking, not elsewhere classified: Secondary | ICD-10-CM | POA: Diagnosis not present

## 2022-04-05 NOTE — Therapy (Signed)
[place] MAIN [facility] SERVICES [address] [place], [place], [address] Phone: [phone]   Fax:  [phone]  Occupational Therapy Evaluation  Patient Details  Name: Mary [name] MRN: [id number] Date of Birth: [birth year] Referring Provider (OT): Ida Rogue, MD   Encounter Date: 04/05/2022   OT End of Session - 04/05/22 1427     Visit Number 1    Number of Visits 1    Date for OT Re-Evaluation 07/04/22    OT Start Time 0200    OT Stop Time 0315    OT Time Calculation (min) 75 min    Equipment Utilized During Treatment [employer] Flexitouch    Activity Tolerance Patient tolerated treatment well;Patient limited by pain;No increased pain    Behavior During Therapy WFL for tasks assessed/performed             Past Medical History:  Diagnosis Date   Arrhythmia    Back pain    Depression with anxiety    Hip pain    Hyperlipidemia    Hypertension    Neuromuscular disorder ([place])    Paroxysmal tachycardia ([place])     Past Surgical History:  Procedure Laterality Date   ABDOMINAL HYSTERECTOMY     BREAST BIOPSY     BREAST EXCISIONAL BIOPSY Left 2002   benign   CARDIAC CATHETERIZATION     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   x2   TOTAL HIP ARTHROPLASTY Bilateral    done twice    There were no vitals filed for this visit.   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 Pt is referred to lymphedema care by Tanda Rockers, MD. Mary [name] is well known to this OT. She completed a modified course of Complete Decongestive Therapy (CDT)  between 07/31/21 and 10/21/21 for 19 visits, including manual lymphatic drainage (MLD), skin care, lymphatic pumping  exercise and short stretch compression wrapping below the knees. Pt's goal for returning to OT today is to try once again to obtain a Flexitouch device, an advanced sequential pneumatic  compression  "pump", to assist with lymphedema self management at home over time, Manufacturer's rep from  [employer], Luan Pulling, is here today to assist w/ a repeated  trial of the advanced Flexitouch on the LLE using 30-55 mmHg.  Pt reports she has used her basic Lymphapress "pump" ( [id number])  on both legs at a time, 3-4 x weekly for 4 years, but symptoms of lymphedema have gotten worse rather than better over time.    Pertinent History HTN, B TKA x 2, B THA x2, Diastolic heart failure, back pain, tachycardia,    Limitations BLE weakness, chronic, progressive BLE swelling and associated pain and skin changed (Lymphedema), difficulty walking, decreased standing tolerance, impaired transfers and functional mobility, impaired LB dressing, Limited L hip A/PROM, impaired dynamic balance, difficulty w/ LB dressing-fitting shoes, socks, pants    Repetition Increases Symptoms    Special Tests Intake FOTO score = 55/100; + [place]    Patient Stated Goals get leg swelling under control; I want to be able to get out and walk    Pain Location Leg    Pain Orientation Right;Left    Pain Descriptors / Indicators Tightness;Tender;Heaviness;Discomfort    Pain Type Chronic pain    Pain Onset Other (comment)   onset with lymphedema several years ago   Pain Frequency Intermittent  Aggravating Factors  standing, walking, dependent sitting    Pain Relieving Factors leg elevation, MLD, resting    Effect of Pain on Daily Activities BLE swelling and associated pain limits functional ambulation and transfers, basic and instrumental ADLs performance requiring standing walking and /or dependent sitting > 10 minutes, performance of productive activities  and leisure pursuits, social participation. Limits body image, clothing selection.               [facility] OT Assessment - 04/05/22 1508       Assessment   Medical Diagnosis BLE Lipo Lymphedema, stage 2 mild    Referring Provider (OT) Ida Rogue, MD     Onset Date/Surgical Date --   young adulthood   Hand Dominance Left    Prior Therapy CDT to BLE. 19 visits,  from 07/31/21- 10/21/21      Precautions   Precautions Fall      Balance Screen   Has the patient fallen in the past 6 months Yes    How many times? 1    Has the patient had a decrease in activity level because of a fear of falling?  No    Is the patient reluctant to leave their home because of a fear of falling?  No      Home  Environment   Writer    Lives With Alone      Prior Function   Level of Independence Independent    Vocation Retired;Other (comment)    Leisure plays bridge 2 x month, loves vacation home at [employer], games on iPad, sing in church choir,bible study groups,   bible studies, choir practice     IADL   Prior Level of Function Shopping I    Shopping Shops independently for [employer]   needs periodic seated rest breaks due to increased swelling with dependent positioning.   Prior Level of Function Light Housekeeping min A    Light Housekeeping Does personal laundry completely;Launders small items, rinses stockings, etc.;Performs light daily tasks such as dishwashing, bed making;Performs light daily tasks but cannot maintain acceptable level of cleanliness;Needs help with all home maintenance tasks    Prior Level of Function Meal Prep I    Meal Prep Plans, prepares and serves adequate meals independently   needs periodic seated rest breaks due to increased swelling with dependent positioning.   Prior Level of Function Community Mobility drives own car      Mobility   Mobility Status History of falls      Vision - History   Baseline Vision Wears glasses all the time      Activity Tolerance   Activity Tolerance Endurance does not limit participation in activity      Cognition   Overall Cognitive Status Within Functional Limits for tasks assessed      Posture/Postural Control   Posture/Postural Control Postural limitations     Postural Limitations Rounded Shoulders;Forward head      Sensation   Light Touch Appears Intact      Coordination   Gross Motor Movements are Fluid and Coordinated Yes      ROM / Strength   AROM / PROM / Strength AROM   decreased hip and knee AROM. Unable to reach feet to don/  doff shoes and socks, inspect skin , groom nails, perform skin care     Strength   Overall Strength Deficits      Hand Function   Right Hand Gross Grasp Functional  Left Hand Gross Grasp Functional              LYMPHEDEMA/ONCOLOGY QUESTIONNAIRE - 04/05/22 1518       Left Lower Extremity Lymphedema   Other PRE LLE 04/05/22 : Thigh =56 cm, knee 44 cm, calf =36 cm, ankle =28 cm. POST: thigh=52 cm, knee=44 cm, calf=35.5 cm, ankle = 27.5 cm.    Other PRE-RLE 04/05/22: Thigh =53 cm, knee=45 cm, calf= 35 cm, ankle = 25 cm.    Other 11/09/21 Demo:  Widest Hips= 125 cm. 02/08/22 self reported waist = 112 cm. 04/05/22: Widest hips 126 cm. Stomach 123 cm.                    OT Treatments/Exercises (OP) - 04/05/22 1514       Manual Therapy   Manual Therapy Edema management;Other (comment)   Hip circumference measures 125 cm.   Edema Management Trial of advanced sequential pneumatic compression device (H6073) , or "pump" for 1 hr  utilizing LLE and LLQ garments with 30-40 m mmHg    Compression Bandaging no compression after session. Pt will apppy loaned wraps when she arrives home                    OT Education - 04/05/22 1537     Education Details Pt/ family education for basic and advanced sequential pneumatic compression devices, or "pump", including clinical indications, how thy work, how the basic pneumatic "pump" differs from the advanced Flexitouch, indications, contraindications and precautions and care and use routines for pneumatic compression devices. Patient's questions were answered throughout trial and handouts and Internet resources given for reference.    Person(s)  Educated Patient    Methods Explanation;Demonstration;Handout    Comprehension Verbalized understanding;Returned demonstration;Need further instruction                 OT Long Term Goals - 04/05/22 1541       OT LONG TERM GOAL #1   Title Pt will be modified independent aith donning, doffing and operating the Flexitouch sequential pneumatic compression device after skilled training at home with manufacturer's rep by issue date for improved lymphedema self-management to limit progression.    Baseline 55/100    Time 12    Status --   Intake FOTO score: 55/100%. Final FOTO 5/51/23 = 52/100%. 3 point decrease overall. Goal not met.     OT LONG TERM GOAL #2   Time 4    Target Date --   4th OT visit     OT LONG TERM GOAL #3   Status --   Patient's family member has learned to apply wraps using gradient techniques, however, limb is not consistently wrapped on a daily basis as is recommended for optimal volume reduction. 10/28/21: Pt never achieved compression wrap awpplication goal.   Target Date --   4th OT visit     OT LONG TERM GOAL #4   Status --   LLE overall INCREASED by 9.23% in volume below the knee. GOAL not met due to inconsistent compression. No CG support as recommended.     OT LONG TERM GOAL #5   Baseline D    Status --   minimal compliance w LE self care home program between sessions     OT LONG TERM GOAL #6   Baseline D                   Plan - 04/05/22 1525  Clinical Impression Statement Pt has used a basic Lymphapress [id number]  sequential pneumatic compression "pump" (E0651), 3-4 x weekly for last 4 years using variable pressure. The basic device has failed to control chronic lipo-lymphedema, including progressive swelling of lower limbs, buttocks and hips, and lipo-lymphedema has progressed. Lymphedema associated skin and tissue changes, including painful, bruising and fatty fibrosis typical of lipo-lymphedema, also has progressed. Mary [name]  commenced Occupational Therapy for conservative Complete Decongestive Therapy from 07/31/21 through 10/21/21 completing 19 visits. Treatment includes manual lymphatic drainage (MLD), skin care, therapeutic exercise and multilayer compression bandaging, as well as extensive self-care education and BLE elevation. Pt has continued exercise, elevation and skin care during self=-management phse of CDT, but hip and knee AROM deficits limit her ability to perform self MLD don andto  doff compression garments independently . She does not have assistance at home with garments and has not been successful using assistive devices for accessing compression garments.  The Flexitouch pneumatic compression device is medically necessary for  this patient to sustain optimal long term LE self-management of lipo-lymphedema and to limit progression and to limit further functional decline.    OT Occupational Profile and History Detailed Assessment- Review of Records and additional review of physical, cognitive, psychosocial history related to current functional performance    Occupational performance deficits (Please refer to evaluation for details): ADL's;IADL's;Rest and Sleep;Leisure;Social Participation;Work    Body Structure / Function / Physical Skills ADL;ROM;IADL;Edema;Balance;Skin integrity;Mobility;Flexibility;Decreased knowledge of precautions;Decreased knowledge of use of DME;Gait;Pain    Rehab Potential Good    Clinical Decision Making Several treatment options, min-mod task modification necessary    Comorbidities Affecting Occupational Performance: Presence of comorbidities impacting occupational performance    Modification or Assistance to Complete Evaluation  Min-Moderate modification of tasks or assist with assess necessary to complete eval    OT Frequency Other (comment)   One time evaluation to repeat trial in clinic with advanced Flexitouch device for optimal self management at home, and follow -along PRN to  support self care   OT Duration --    OT Treatment/Interventions Self-care/ADL training;DME and/or AE instruction;Therapeutic activities;Coping strategies training;Patient/family education    Plan Fit Pt with advanced Flexitouch device for optimal LE self-management at home over time. Assist with periodic replacement of compression garments and devices,    Consulted and Agree with Plan of Care Patient             Patient will benefit from skilled therapeutic intervention in order to improve the following deficits and impairments:   Body Structure / Function / Physical Skills: ADL, ROM, IADL, Edema, Balance, Skin integrity, Mobility, Flexibility, Decreased knowledge of precautions, Decreased knowledge of use of DME, Gait, Pain       Visit Diagnosis: Lymphedema, not elsewhere classified    Problem List Patient Active Problem List   Diagnosis Date Noted   Insomnia 12/16/2021   Depression, recurrent (HCC) 12/16/2021   Diastolic heart failure (HCC) 07/30/2021   Hyperlipidemia 07/30/2021   Lymphedema 07/30/2021   Gait instability 06/02/2021   Primary hypertension 06/02/2021    Mary Gilliam, Mary, Mary [name], Mary [name] 04/05/22 3:48 PM   [facility] SERVICES [address] [place], [place], [address] Phone: [phone]   Fax:  [phone]  Name: Mary [name] MRN: [id number] Date of Birth: 09/23/1940  

## 2022-04-05 NOTE — Therapy (Signed)
OUTPATIENT PHYSICAL THERAPY TREATMENT NOTE    Patient Name: Mary [name] MRN: [id number] DOB:November 12, 1940, 81 y.o., female Today's Date: 04/05/2022  PCP: Valerie Roys, DO REFERRING PROVIDER: Minna Merritts, MD   PT End of Session - 04/05/22 1517     Visit Number 52    Number of Visits 28    Date for PT Re-Evaluation 04/14/22    Authorization Type Medicare Primary, [employer] Secondary    Authorization Time Period 01/20/22-04/14/22    Progress Note Due on Visit 1    PT Start Time 1517    PT Stop Time 1600    PT Time Calculation (min) 43 min    Equipment Utilized During Treatment Gait belt    Activity Tolerance Patient tolerated treatment well;No increased pain    Behavior During Therapy WFL for tasks assessed/performed               Past Medical History:  Diagnosis Date   Arrhythmia    Back pain    Depression with anxiety    Hip pain    Hyperlipidemia    Hypertension    Neuromuscular disorder (HCC)    Paroxysmal tachycardia ([place])    Past Surgical History:  Procedure Laterality Date   ABDOMINAL HYSTERECTOMY     BREAST BIOPSY     BREAST EXCISIONAL BIOPSY Left 2002   benign   CARDIAC CATHETERIZATION     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   x2   TOTAL HIP ARTHROPLASTY Bilateral    done twice   Patient Active Problem List   Diagnosis Date Noted   Insomnia 12/16/2021   Depression, recurrent ([place]) 95/18/8416   Diastolic heart failure ([place]) 07/30/2021   Hyperlipidemia 07/30/2021   Lymphedema 07/30/2021   Gait instability 06/02/2021   Primary hypertension 06/02/2021    REFERRING DIAG: R26.81 (ICD-10-CM) - Gait instability  THERAPY DIAG:  Difficulty in walking, not elsewhere classified  Muscle weakness (generalized)  Other abnormalities of gait and mobility  Unsteadiness on feet  Abnormality of gait and mobility  Rationale for Evaluation and Treatment Rehabilitation  PERTINENT HISTORY: Pt is a pleasant 81 y/o female referred to PT for gait instability. Pt  ambulates with RW. She reports onset of gait instability started several years ago. 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 Pt has had PT in past following surgeries. Pt reports hx of frequent falls. She has had 2 falls in past 6 months. Pt believes falls are due to difficulty picking up L foot and dragging L foot. Pt reports no injury with falls.  Pt being seen by OT for lymphedema, reports LLE more affected than RLE. Pt currently lives alone. Her spouse passed away recently in 07/01/22. Her adult son and DIL live nearby and can assist her. Pt denies any dizziness or lightheadedness. Pt reports no pain currently. 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   PRECAUTIONS: N/A  SUBJECTIVE:    Pt arrives to PT from OT. In good spirits. No LOB or falls since last session.  PAIN:  Are you having pain? No   TODAY'S TREATMENT 04/05/22   Gait with SPC: 150' with SPC in RUE. Performed additional 150' with 2# AW's with CGA. Decreased foot clearance but able to perform without scuffing shoes. Seated rest b/t 2 bouts.   Side stepping over red mat with 2# AW's: x4  laps, CGA. Difficulty entering and exiting mat due to SLS and unstable surface. Ankle and hip strategy to correct balance intermittently.   4" alternating forward step ups: x10/LE. CGA with light BUE support   4" step up and over laterally R/L. X10/side. Progressed from BUE support to SUE support to light finger touch support. CGA.   airex pad lateral step ups R/L x10/LE. Performed with SUE to finger touch support. CGA.     Rationale for Evaluation and Treatment Rehabilitation    PATIENT EDUCATION: Education details: Patient educated extensively regarding proper utilization of cane as well as demonstration of why proper use of cane will improve her ability to ambulate  independently Person educated: Patient Education method: Explanation, Demonstration, and Verbal cues Education comprehension: verbalized understanding, returned demonstration, verbal cues required, and needs further education   HOME EXERCISE PROGRAM:  no updates as of 12/17/21 08/04/2021: Access Code: [email or web address] 09/28/2021 Access Code: [email or web address] 10/28/2021 Access Code: [email or web address]   01/11/2022 Access Code: [email or web address]   PT Short Term Goals      PT SHORT TERM GOAL #1   Title Pt will be independent with HEP in order to improve strength and balance in order to decrease fall risk and improve function at home and work.    Baseline 3/7: provided pt with initial HEP 4/5; compliant    Time 6    Period Weeks    Status Achieved    Target Date 09/15/21              PT Long Term Goals       PT LONG TERM GOAL #1   Title Patient will increase FOTO score to equal to or greater than 62 to demonstrate statistically significant improvement in mobility and quality of life.    Baseline 3/7: 50 4/5: 55%; 10/28/2021=53%; 8/23: 58   Time 12    Period Weeks    Status Revise    Target Date 03/17/22      PT LONG TERM GOAL #2   Title Pt will improve BERG to >49 in order to demonstrate clinically significant improvement in balance.    Baseline 3/7: 40/56 4/5: 44/56; 01/20/22: 47 02/16/22: ongoing    Time 12    Period Weeks    Status Revise    Target Date 03/17/22      PT LONG TERM GOAL #3   Title Pt will decrease 5TSTS by at least 3 seconds in order to demonstrate clinically significant improvement in LE strength.    Baseline 3/7: 15.36 sec with use of BUEs to assist 4/5: 9.6 seconds SUE support    Time 12    Period Weeks    Status Achieved    Target Date 03/17/22      PT LONG TERM GOAL #4   Title Pt will improve ABC by at least 13% in order to demonstrate clinically significant improvement in balance confidence.    Baseline 3/7: 50.63% 4/5: 62% 5/31= 63%; 7/20: 70.31%   Time 12    Period Weeks     Status Achieved   Target Date 03/17/22      PT LONG TERM GOAL #5   Title Patient will increase 10 meter walk test to >1.93ms as to improve gait speed for better community ambulation and to reduce fall risk.    Baseline 3/7: 0.75 m/s with 4WW 4/5: 0.96 m/s with 4 WW; 10/28/2021= 0.97 m/s with 42ZD 7/20: 0.96 m/s with 4WW   Time 12    Period Weeks  Status Partially Met    Target Date 03/17/22        Additional Long Term Goals   Additional Long Term Goals Yes      PT LONG TERM GOAL #6   Title Patient will demonstrate improved balance by exhibiting ability to bowler stance (wide tandem) bilat without UE support for >60sec to demonstrate improved frontal plane postural control.    Baseline 01/20/22: 30sec bilat, partail hand support to perform on Rt side only    Time 12    Period Weeks    Status Revised    Target Date 03/17/22        PT LONG TERM GOAL #7   Title Patient will present with improved right knee AROM <10 deg knee ext from zero for decreased limp and leg length discrepancy to aide in walking and weight shifting.    Baseline 10/28/2021= 15 deg from zero (right knee ext) ; 7/20: 3 deg from zero    Time 12    Period Weeks    Status Achieved    Target Date 03/17/22       PT LONG TERM GOAL #8   Title Patient will demo modified Ind floor to sit transfer using chair for improved ability to get up off the floor for decreased caregiver or EMS Support.    Baseline 10/28/2021= Not formally assessed yet patient reports difficulty and desire to practice to be able to perform on her own.; 7/20: CGA and min VC/demo to complete floor to stand transfer, use of chair for UE support 9/19: able to complete floor to and from  stand transfer with min cueing   Time 12    Period Weeks    Status Ongoing     Target Date 03/17/22             Plan     Clinical Impression Statement Continuing PT POC with focus on dynamic hip strengthening exercises to assist in improved gait mechanics. Pt  remains with trendelenburg gait with need for hip flexor strengthening and hip extensor strengthening to improve step heights during swing phase and terminal stance for gait speed. Pt does need frequent VC's to prevent UE support with step ups due to imbalance and quad/hip weakness. Overall pt remains highly motivated and will continue to benefit from skilled PT services to improve gait mechanics and reduce risk of falls.     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, B THA x2, diastolic heart failure, back pain, tachycardia    Examination-Activity Limitations Bend;Reach Overhead;Stairs;Transfers;Lift;Squat;Locomotion Level;Carry;Stand;Dressing    Examination-Participation Restrictions Volunteer;Community Activity;Shop;Cleaning;Laundry;Yard Work    [employer] officer Evolving/Moderate complexity    Rehab Potential Good    PT Frequency 2x / week    PT Duration 12 weeks    PT Treatment/Interventions ADLs/Self Care Home Management;Aquatic Therapy;Biofeedback;Canalith Repostioning;Cryotherapy;Electrical Stimulation;Moist Heat;Traction;Ultrasound;DME Instruction;Gait training;Functional Biochemist, clinical;Therapeutic activities;Therapeutic exercise;Balance training;Neuromuscular re-education;Patient/family education;Orthotic Fit/Training;Wheelchair mobility training;Manual techniques;Passive range of motion;Scar mobilization;Dry needling;Energy conservation;Splinting;Taping;Vestibular;Visual/perceptual remediation/compensation;Joint Manipulations    PT Next Visit Plan Re-certification for next visit. Heavy front plane emphasis on hip strategy and ABDCT strength in functional context, cane practice and functional stability training        Consulted and Agree with Plan of Care Patient            Salem Caster. Fairly IV, PT, DPT Physical  Therapist- [facility]  04/05/22, 6:30 PM

## 2022-04-07 ENCOUNTER — Ambulatory Visit: Payer: Medicare Other

## 2022-04-07 DIAGNOSIS — R2681 Unsteadiness on feet: Secondary | ICD-10-CM

## 2022-04-07 DIAGNOSIS — R2689 Other abnormalities of gait and mobility: Secondary | ICD-10-CM

## 2022-04-07 DIAGNOSIS — R269 Unspecified abnormalities of gait and mobility: Secondary | ICD-10-CM

## 2022-04-07 DIAGNOSIS — R262 Difficulty in walking, not elsewhere classified: Secondary | ICD-10-CM | POA: Diagnosis not present

## 2022-04-07 DIAGNOSIS — M6281 Muscle weakness (generalized): Secondary | ICD-10-CM

## 2022-04-07 NOTE — Therapy (Signed)
OUTPATIENT PHYSICAL THERAPY TREATMENT NOTE    Patient Name: Mary [name] MRN: [id number] DOB:Apr 29, 1941, 81 y.o., female Today's Date: 04/07/2022  PCP: Valerie Roys, DO REFERRING PROVIDER: Minna Merritts, MD   PT End of Session - 04/07/22 1308     Visit Number 40   Corrected error in visit counts from earlier visits.   Number of Visits 52    Date for PT Re-Evaluation 04/14/22    Authorization Type Medicare Primary, AARP Secondary    Authorization Time Period 01/20/22-04/14/22    Progress Note Due on Visit 64    PT Start Time 1303    PT Stop Time 1345    PT Time Calculation (min) 42 min    Equipment Utilized During Treatment Gait belt    Activity Tolerance Patient tolerated treatment well;No increased pain    Behavior During Therapy WFL for tasks assessed/performed               Past Medical History:  Diagnosis Date   Arrhythmia    Back pain    Depression with anxiety    Hip pain    Hyperlipidemia    Hypertension    Neuromuscular disorder (HCC)    Paroxysmal tachycardia ([place])    Past Surgical History:  Procedure Laterality Date   ABDOMINAL HYSTERECTOMY     BREAST BIOPSY     BREAST EXCISIONAL BIOPSY Left 2002   benign   CARDIAC CATHETERIZATION     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   x2   TOTAL HIP ARTHROPLASTY Bilateral    done twice   Patient Active Problem List   Diagnosis Date Noted   Insomnia 12/16/2021   Depression, recurrent ([place]) 62/70/3500   Diastolic heart failure ([place]) 07/30/2021   Hyperlipidemia 07/30/2021   Lymphedema 07/30/2021   Gait instability 06/02/2021   Primary hypertension 06/02/2021    REFERRING DIAG: R26.81 (ICD-10-CM) - Gait instability  THERAPY DIAG:  Difficulty in walking, not elsewhere classified  Muscle weakness (generalized)  Other abnormalities of gait and mobility  Unsteadiness on feet  Abnormality of gait and mobility  Rationale for Evaluation and Treatment Rehabilitation  PERTINENT HISTORY: Pt is a pleasant  81 y/o female referred to PT for gait instability. Pt ambulates with RW. She reports onset of gait instability started several years ago. 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 Pt has had PT in past following surgeries. Pt reports hx of frequent falls. She has had 2 falls in past 6 months. Pt believes falls are due to difficulty picking up L foot and dragging L foot. Pt reports no injury with falls.  Pt being seen by OT for lymphedema, reports LLE more affected than RLE. Pt currently lives alone. Her spouse passed away recently in 07-06-2022. Her adult son and DIL live nearby and can assist her. Pt denies any dizziness or lightheadedness. Pt reports no pain currently. 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   PRECAUTIONS: N/A  SUBJECTIVE:    Pt notes she wants to feel confident in going to the [facility] following discharge from this clinic.  Pt notes she is not there yet and would like to perform another month and assess where she is.  PAIN:  Are you having pain? No   TODAY'S TREATMENT 04/07/22  Gait with SPC: 150' with SPC in RUE. Performed additional 150' with 2#  AW's with CGA. No scuffing during ambulation, however pt does require seated rest break before second bout.  Standing marches with 2# AW's donned with CGA.  Seated resisted IR with use of RTB, 2x15 each LE Seated resisted IR with use of RTB, 2x15 each LE  Airex pad lateral step ups R/L x10/LE. Performed with SUE to finger touch support. CGA.  Prone quad stretch with overpressure applied by therapist, 30 sec bouts Prone IR/ER stretches, 30 sec bouts for pt to be able to rinse the bottom of her foot off in the shower     Rationale for Evaluation and Treatment Rehabilitation    PATIENT EDUCATION: Education details: Patient educated extensively regarding proper utilization of  cane as well as demonstration of why proper use of cane will improve her ability to ambulate independently Person educated: Patient Education method: Explanation, Demonstration, and Verbal cues Education comprehension: verbalized understanding, returned demonstration, verbal cues required, and needs further education   HOME EXERCISE PROGRAM:  no updates as of 12/17/21 08/04/2021: Access Code: [email or web address] 09/28/2021 Access Code: [email or web address] 10/28/2021 Access Code: [email or web address]   01/11/2022 Access Code: [email or web address]   PT Short Term Goals      PT SHORT TERM GOAL #1   Title Pt will be independent with HEP in order to improve strength and balance in order to decrease fall risk and improve function at home and work.    Baseline 3/7: provided pt with initial HEP 4/5; compliant    Time 6    Period Weeks    Status Achieved    Target Date 09/15/21              PT Long Term Goals       PT LONG TERM GOAL #1   Title Patient will increase FOTO score to equal to or greater than 62 to demonstrate statistically significant improvement in mobility and quality of life.    Baseline 3/7: 50 4/5: 55%; 10/28/2021=53%; 8/23: 58   Time 12    Period Weeks    Status Revise    Target Date 03/17/22      PT LONG TERM GOAL #2   Title Pt will improve BERG to >49 in order to demonstrate clinically significant improvement in balance.    Baseline 3/7: 40/56 4/5: 44/56; 01/20/22: 47 02/16/22: ongoing    Time 12    Period Weeks    Status Revise    Target Date 03/17/22      PT LONG TERM GOAL #3   Title Pt will decrease 5TSTS by at least 3 seconds in order to demonstrate clinically significant improvement in LE strength.    Baseline 3/7: 15.36 sec with use of BUEs to assist 4/5: 9.6 seconds SUE support    Time 12    Period Weeks    Status Achieved    Target Date 03/17/22      PT LONG TERM GOAL #4   Title Pt will improve ABC by at least 13% in order to demonstrate clinically significant improvement in balance confidence.     Baseline 3/7: 50.63% 4/5: 62% 5/31= 63%; 7/20: 70.31%   Time 12    Period Weeks    Status Achieved   Target Date 03/17/22      PT LONG TERM GOAL #5   Title Patient will increase 10 meter walk test to >1.41ms as to improve gait speed for better community ambulation and to reduce fall risk.    Baseline 3/7: 0.75 m/s with 4WW 4/5: 0.96 m/s with 4  [employer]; 10/28/2021= 0.97 m/s with 8SH; 7/20: 0.96 m/s with 4WW   Time 12    Period Weeks    Status Partially Met    Target Date 03/17/22        Additional Long Term Goals   Additional Long Term Goals Yes      PT LONG TERM GOAL #6   Title Patient will demonstrate improved balance by exhibiting ability to bowler stance (wide tandem) bilat without UE support for >60sec to demonstrate improved frontal plane postural control.    Baseline 01/20/22: 30sec bilat, partail hand support to perform on Rt side only    Time 12    Period Weeks    Status Revised    Target Date 03/17/22        PT LONG TERM GOAL #7   Title Patient will present with improved right knee AROM <10 deg knee ext from zero for decreased limp and leg length discrepancy to aide in walking and weight shifting.    Baseline 10/28/2021= 15 deg from zero (right knee ext) ; 7/20: 3 deg from zero    Time 12    Period Weeks    Status Achieved    Target Date 03/17/22       PT LONG TERM GOAL #8   Title Patient will demo modified Ind floor to sit transfer using chair for improved ability to get up off the floor for decreased caregiver or EMS Support.    Baseline 10/28/2021= Not formally assessed yet patient reports difficulty and desire to practice to be able to perform on her own.; 7/20: CGA and min VC/demo to complete floor to stand transfer, use of chair for UE support 9/19: able to complete floor to and from  stand transfer with min cueing   Time 12    Period Weeks    Status Ongoing     Target Date 03/17/22             Plan     Clinical Impression Statement Pt performed well and  will be ready for re-certification and goal assessment at next visit.  Pt is making good progress towards goals, however noted that she is unable to wash the bottom of her foot off in the shower.  She has significantly decreased hip ER at this time, likely due to joint restriction from 2 prior hip surgeries and scar tissue restrictions as well.  Pt advised to continue to work on ER of the hip at home as part of home exercise program.   Pt will continue to benefit from skilled therapy to address remaining deficits in order to improve overall QoL and return to PLOF.      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, B THA x2, diastolic heart failure, back pain, tachycardia    Examination-Activity Limitations Bend;Reach Overhead;Stairs;Transfers;Lift;Squat;Locomotion Level;Carry;Stand;Dressing    Examination-Participation Restrictions Volunteer;Community Activity;Shop;Cleaning;Laundry;Yard Work    [employer] officer Evolving/Moderate complexity    Rehab Potential Good    PT Frequency 2x / week    PT Duration 12 weeks    PT Treatment/Interventions ADLs/Self Care Home Management;Aquatic Therapy;Biofeedback;Canalith Repostioning;Cryotherapy;Electrical Stimulation;Moist Heat;Traction;Ultrasound;DME Instruction;Gait training;Functional Biochemist, clinical;Therapeutic activities;Therapeutic exercise;Balance training;Neuromuscular re-education;Patient/family education;Orthotic Fit/Training;Wheelchair mobility training;Manual techniques;Passive range of motion;Scar mobilization;Dry needling;Energy conservation;Splinting;Taping;Vestibular;Visual/perceptual remediation/compensation;Joint Manipulations    PT Next Visit Plan Re-certification for next visit. Heavy front plane emphasis on hip strategy and ABDCT strength in functional context, cane practice and  functional stability  training        Consulted and Agree with Plan of Care Patient            Gwenlyn Saran, PT, DPT Physical Therapist- [facility]  04/07/22, 5:38 PM

## 2022-04-12 ENCOUNTER — Encounter: Payer: Medicare Other | Admitting: Occupational Therapy

## 2022-04-12 ENCOUNTER — Ambulatory Visit: Payer: Medicare Other | Admitting: Physical Therapy

## 2022-04-12 DIAGNOSIS — M6281 Muscle weakness (generalized): Secondary | ICD-10-CM

## 2022-04-12 DIAGNOSIS — R262 Difficulty in walking, not elsewhere classified: Secondary | ICD-10-CM | POA: Diagnosis not present

## 2022-04-12 DIAGNOSIS — R269 Unspecified abnormalities of gait and mobility: Secondary | ICD-10-CM

## 2022-04-12 DIAGNOSIS — R2681 Unsteadiness on feet: Secondary | ICD-10-CM

## 2022-04-12 DIAGNOSIS — R2689 Other abnormalities of gait and mobility: Secondary | ICD-10-CM

## 2022-04-12 NOTE — Therapy (Signed)
OUTPATIENT PHYSICAL THERAPY TREATMENT NOTE/ Recert    Patient Name: Mary [name] MRN: [id number] DOB:1940/08/15, 81 y.o., female Today's Date: 04/12/2022  PCP: Valerie Roys, DO REFERRING PROVIDER: Minna Merritts, MD   PT End of Session - 04/12/22 1523     Visit Number 55    Number of Visits 63    Date for PT Re-Evaluation 06/07/22    Authorization Type Medicare Primary, A[employer] Secondary    Authorization Time Period 01/20/22-04/14/22    Progress Note Due on Visit 60    PT Start Time 1519    PT Stop Time 1557    PT Time Calculation (min) 38 min    Equipment Utilized During Treatment Gait belt    Activity Tolerance Patient tolerated treatment well;No increased pain    Behavior During Therapy WFL for tasks assessed/performed                Past Medical History:  Diagnosis Date   Arrhythmia    Back pain    Depression with anxiety    Hip pain    Hyperlipidemia    Hypertension    Neuromuscular disorder (HCC)    Paroxysmal tachycardia ([place])    Past Surgical History:  Procedure Laterality Date   ABDOMINAL HYSTERECTOMY     BREAST BIOPSY     BREAST EXCISIONAL BIOPSY Left 2002   benign   CARDIAC CATHETERIZATION     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   x2   TOTAL HIP ARTHROPLASTY Bilateral    done twice   Patient Active Problem List   Diagnosis Date Noted   Insomnia 12/16/2021   Depression, recurrent ([place]) 40/34/7425   Diastolic heart failure ([place]) 07/30/2021   Hyperlipidemia 07/30/2021   Lymphedema 07/30/2021   Gait instability 06/02/2021   Primary hypertension 06/02/2021    REFERRING DIAG: R26.81 (ICD-10-CM) - Gait instability  THERAPY DIAG:  Difficulty in walking, not elsewhere classified  Muscle weakness (generalized)  Other abnormalities of gait and mobility  Unsteadiness on feet  Abnormality of gait and mobility  Rationale for Evaluation and Treatment Rehabilitation  PERTINENT HISTORY: Pt is a pleasant 81 y/o female referred to PT for gait  instability. Pt ambulates with RW. She reports onset of gait instability started several years ago. 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 Pt has had PT in past following surgeries. Pt reports hx of frequent falls. She has had 2 falls in past 6 months. Pt believes falls are due to difficulty picking up L foot and dragging L foot. Pt reports no injury with falls.  Pt being seen by OT for lymphedema, reports LLE more affected than RLE. Pt currently lives alone. Her spouse passed away recently in June 28, 2022. Her adult son and DIL live nearby and can assist her. Pt denies any dizziness or lightheadedness. Pt reports no pain currently. 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   PRECAUTIONS: N/A  SUBJECTIVE:    Pt reports she feels she is doing well and feels she is getting a lot stronger. Pt would like to work on ability to participate safely in the well zone.   PAIN:  Are you having pain? No   TODAY'S TREATMENT 04/12/22  Physical therapy treatment session today consisted of completing assessment of goals and administration of testing as demonstrated and documented in flow sheet, treatment, and goals section  of this note. Addition treatments may be found below.       Rationale for Evaluation and Treatment Rehabilitation    PATIENT EDUCATION: Education details: Patient educated extensively regarding proper utilization of cane as well as demonstration of why proper use of cane will improve her ability to ambulate independently Person educated: Patient Education method: Explanation, Demonstration, and Verbal cues Education comprehension: verbalized understanding, returned demonstration, verbal cues required, and needs further education   HOME EXERCISE PROGRAM:  no updates as of 12/17/21 08/04/2021: Access Code: [email or web address] 09/28/2021 Access Code:  [email or web address] 10/28/2021 Access Code: [email or web address]   01/11/2022 Access Code: [email or web address]   PT Short Term Goals      PT SHORT TERM GOAL #1   Title Pt will be independent with HEP in order to improve strength and balance in order to decrease fall risk and improve function at home and work.    Baseline 3/7: provided pt with initial HEP 4/5; compliant    Time 6    Period Weeks    Status Achieved    Target Date 09/15/21              PT Long Term Goals       PT LONG TERM GOAL #1   Title Patient will increase FOTO score to equal to or greater than 62 to demonstrate statistically significant improvement in mobility and quality of life.    Baseline 3/7: 50 4/5: 55%; 10/28/2021=53%; 8/23: 58 11/13:64   Time 12    Period Weeks    Status Revise    Target Date 03/17/22      PT LONG TERM GOAL #2   Title Pt will improve BERG to >49 in order to demonstrate clinically significant improvement in balance.    Baseline 3/7: 40/56 4/5: 44/56; 01/20/22: 47 02/16/22: ongoing    Time 12    Period Weeks    Status Revise    Target Date 03/17/22      PT LONG TERM GOAL #3   Title Pt will decrease 5TSTS by at least 3 seconds in order to demonstrate clinically significant improvement in LE strength.    Baseline 3/7: 15.36 sec with use of BUEs to assist 4/5: 9.6 seconds SUE support    Time 12    Period Weeks    Status Achieved    Target Date 03/17/22      PT LONG TERM GOAL #4   Title Pt will improve ABC by at least 13% in order to demonstrate clinically significant improvement in balance confidence.    Baseline 3/7: 50.63% 4/5: 62% 5/31= 63%; 7/20: 70.31%   Time 12    Period Weeks    Status Achieved   Target Date 03/17/22      PT LONG TERM GOAL #5   Title Patient will increase 10 meter walk test to >1.11ms as to improve gait speed for better community ambulation and to reduce fall risk.    Baseline 3/7: 0.75 m/s with 4WW 4/5: 0.96 m/s with 4 WW; 10/28/2021= 0.97 m/s with 49ER 7/20: 0.96 m/s with 4WW  11/13:1.028m   Time 12    Period Weeks    Status met   Target Date 03/17/22        Additional Long Term Goals   Additional Long Term Goals Yes      PT LONG TERM GOAL #6   Title Patient will demonstrate improved balance by exhibiting ability to bowler stance (wide tandem) bilat without UE support for >60sec to demonstrate improved frontal  plane postural control.    Baseline 01/20/22: 30sec bilat, partail hand support to perform on Rt side only 11/13: 60 seconds both LE    Time 12    Period Weeks    Status met   Target Date 03/17/22        PT LONG TERM GOAL #7   Title Patient will present with improved right knee AROM <10 deg knee ext from zero for decreased limp and leg length discrepancy to aide in walking and weight shifting.    Baseline 10/28/2021= 15 deg from zero (right knee ext) ; 7/20: 3 deg from zero    Time 12    Period Weeks    Status Achieved    Target Date 03/17/22       PT LONG TERM GOAL #8   Title Patient will demo modified Ind floor to sit transfer using chair for improved ability to get up off the floor for decreased caregiver or EMS Support.    Baseline 10/28/2021= Not formally assessed yet patient reports difficulty and desire to practice to be able to perform on her own.; 7/20: CGA and min VC/demo to complete floor to stand transfer, use of chair for UE support 9/19: able to complete floor to and from  stand transfer with min cueing 11/13: able to perform with SBA and with min cues for proper performance and safety, pt encouraged to perform mental practice strategies for completion safely at home.    Time 12    Period Weeks    Status Ongoing     Target Date 05/24/2022          PT LONG TERM GOAL #9  Title Patient will be independent with progressive home exercise program in the well zone and over to facilitate prolonged improvement in lower extremity strength and fall risk.  Baseline Patient not comfortable with completing any exercises in well zone at  baseline is not familiar with safe utilization of any equipment with her current functional status.  Time 12   Period Weeks   Status Ongoing    Target Date 05/24/2022         Plan     Clinical Impression Statement Patient presents to physical therapy with good motivation for completion of progress note.  Patient has made significant progress toward all her goals meeting several of her goals indicating improved risk for falls and improved overall mobility and ambulation.  Patient still is working toward her goal of progressing to floor to stand transfer utilizing a chair without cues or assistance from physical therapist.  Patient has met her focus on therapeutic outcome survey's goal indicating improved confidence with several functional related activities.  Patient is also met her balance goal for standing and wide tandem indicating improved stability with dynamic ambulation with her quad cane.  Patient will now work toward goal of independent exercise in the well zone as well as improving her floor to stand transfer ability for decreased reliance on caregiver EMS services if she does have a trip to the floor.Pt will continue to benefit from skilled physical therapy intervention to address impairments, improve QOL, and attain therapy goals. Patient's condition has the potential to improve in response to therapy. Maximum improvement is yet to be obtained. The anticipated improvement is attainable and reasonable in a generally predictable time.      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, B THA x2, diastolic heart failure,  back pain, tachycardia    Examination-Activity Limitations Bend;Reach Overhead;Stairs;Transfers;Lift;Squat;Locomotion Level;Carry;Stand;Dressing    Examination-Participation Restrictions Volunteer;Community Activity;Shop;Cleaning;Laundry;Yard Work     [employer]chant [employer] officer Evolving/Moderate complexity    Rehab Potential Good    PT Frequency 2x / week    PT Duration 12 weeks    PT Treatment/Interventions ADLs/Self Care Home Management;Aquatic Therapy;Biofeedback;Canalith Repostioning;Cryotherapy;Electrical Stimulation;Moist Heat;Traction;Ultrasound;DME Instruction;Gait training;Functional Biochemist, clinical;Therapeutic activities;Therapeutic exercise;Balance training;Neuromuscular re-education;Patient/family education;Orthotic Fit/Training;Wheelchair mobility training;Manual techniques;Passive range of motion;Scar mobilization;Dry needling;Energy conservation;Splinting;Taping;Vestibular;Visual/perceptual remediation/compensation;Joint Manipulations    PT Next Visit Plan Re-certification for next visit. Heavy front plane emphasis on hip strategy and ABDCT strength in functional context, cane practice and functional stability training        Consulted and Agree with Plan of Care Patient            Particia Lather PT  04/12/22, 5:42 PM

## 2022-04-13 NOTE — Therapy (Signed)
OUTPATIENT PHYSICAL THERAPY TREATMENT NOTE    Patient Name: Mary [name] MRN: [id number] DOB:02/10/1941, 81 y.o., female Today's Date: 04/14/2022  PCP: Valerie Roys, DO REFERRING PROVIDER: Minna Merritts, MD   PT End of Session - 04/14/22 1306     Visit Number 35    Number of Visits 36    Date for PT Re-Evaluation 06/07/22    Authorization Type Medicare Primary, AARP Secondary    Authorization Time Period 01/20/22-04/14/22    Progress Note Due on Visit 52    PT Start Time 1304    PT Stop Time 1345    PT Time Calculation (min) 41 min    Equipment Utilized During Treatment Gait belt    Activity Tolerance Patient tolerated treatment well;No increased pain    Behavior During Therapy WFL for tasks assessed/performed                Past Medical History:  Diagnosis Date   Arrhythmia    Back pain    Depression with anxiety    Hip pain    Hyperlipidemia    Hypertension    Neuromuscular disorder (HCC)    Paroxysmal tachycardia ([place])    Past Surgical History:  Procedure Laterality Date   ABDOMINAL HYSTERECTOMY     BREAST BIOPSY     BREAST EXCISIONAL BIOPSY Left 2002   benign   CARDIAC CATHETERIZATION     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   x2   TOTAL HIP ARTHROPLASTY Bilateral    done twice   Patient Active Problem List   Diagnosis Date Noted   Insomnia 12/16/2021   Depression, recurrent ([place]) 80/07/4915   Diastolic heart failure ([place]) 07/30/2021   Hyperlipidemia 07/30/2021   Lymphedema 07/30/2021   Gait instability 06/02/2021   Primary hypertension 06/02/2021    REFERRING DIAG: R26.81 (ICD-10-CM) - Gait instability  THERAPY DIAG:  Difficulty in walking, not elsewhere classified  Muscle weakness (generalized)  Other abnormalities of gait and mobility  Unsteadiness on feet  Abnormality of gait and mobility  Rationale for Evaluation and Treatment Rehabilitation  PERTINENT HISTORY: Pt is a pleasant 81 y/o female referred to PT for gait instability.  Pt ambulates with RW. She reports onset of gait instability started several years ago. 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 Pt has had PT in past following surgeries. Pt reports hx of frequent falls. She has had 2 falls in past 6 months. Pt believes falls are due to difficulty picking up L foot and dragging L foot. Pt reports no injury with falls.  Pt being seen by OT for lymphedema, reports LLE more affected than RLE. Pt currently lives alone. Her spouse passed away recently in 06-26-22. Her adult son and DIL live nearby and can assist her. Pt denies any dizziness or lightheadedness. Pt reports no pain currently. 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   PRECAUTIONS: N/A  SUBJECTIVE:   Pt reports she is ready to behind therapy and attempt machines in the [facility] for progression following therapy POC.  Otherwise pt notes she is doing well.   PAIN:  Are you having pain? No   TODAY'S TREATMENT 04/14/22  Wellzone:  Hoist leg press, 5 plates, 2x10 Hoist leg extension, 3 plates, 2x10 Hoist hamstring curl, 3 plates, 2x10  Gait with SPC: 300' with SPC in RUE.  No  scuffing during ambulation, however pt does require seated rest break before second bout.  Standing marches with 2# AW's donned with CGA.  Gave pt instruction and education on different services offered in the [place] with pt noting that she felt much more at ease with transition to the gym following therapy discharge.  Curb training on 4", 6", and 8" respectively with proper sequencing and CGA necessary for safety measures, x5 for first two attempts, x10 with final attempt   Rationale for Evaluation and Treatment Rehabilitation    PATIENT EDUCATION: Education details: Patient educated extensively regarding proper utilization of cane as well as demonstration of why proper  use of cane will improve her ability to ambulate independently Person educated: Patient Education method: Explanation, Demonstration, and Verbal cues Education comprehension: verbalized understanding, returned demonstration, verbal cues required, and needs further education   HOME EXERCISE PROGRAM:  no updates as of 12/17/21 08/04/2021: Access Code: [email or web address] 09/28/2021 Access Code: [email or web address] 10/28/2021 Access Code: [email or web address]   01/11/2022 Access Code: [email or web address]   PT Short Term Goals      PT SHORT TERM GOAL #1   Title Pt will be independent with HEP in order to improve strength and balance in order to decrease fall risk and improve function at home and work.    Baseline 3/7: provided pt with initial HEP 4/5; compliant    Time 6    Period Weeks    Status Achieved    Target Date 09/15/21              PT Long Term Goals       PT LONG TERM GOAL #1   Title Patient will increase FOTO score to equal to or greater than 62 to demonstrate statistically significant improvement in mobility and quality of life.    Baseline 3/7: 50 4/5: 55%; 10/28/2021=53%; 8/23: 58   Time 12    Period Weeks    Status Revise    Target Date 03/17/22      PT LONG TERM GOAL #2   Title Pt will improve BERG to >49 in order to demonstrate clinically significant improvement in balance.    Baseline 3/7: 40/56 4/5: 44/56; 01/20/22: 47 02/16/22: ongoing    Time 12    Period Weeks    Status Revise    Target Date 03/17/22      PT LONG TERM GOAL #3   Title Pt will decrease 5TSTS by at least 3 seconds in order to demonstrate clinically significant improvement in LE strength.    Baseline 3/7: 15.36 sec with use of BUEs to assist 4/5: 9.6 seconds SUE support    Time 12    Period Weeks    Status Achieved    Target Date 03/17/22      PT LONG TERM GOAL #4   Title Pt will improve ABC by at least 13% in order to demonstrate clinically significant improvement in balance confidence.    Baseline 3/7: 50.63% 4/5: 62% 5/31= 63%;  7/20: 70.31%   Time 12    Period Weeks    Status Achieved   Target Date 03/17/22      PT LONG TERM GOAL #5   Title Patient will increase 10 meter walk test to >1.76ms as to improve gait speed for better community ambulation and to reduce fall risk.    Baseline 3/7: 0.75 m/s with 4WW 4/5: 0.96 m/s with 4 WW; 10/28/2021= 0.97 m/s with 49ME 7/20: 0.96 m/s with 4WW   Time 12    Period  Weeks    Status Partially Met    Target Date 03/17/22        Additional Long Term Goals   Additional Long Term Goals Yes      PT LONG TERM GOAL #6   Title Patient will demonstrate improved balance by exhibiting ability to bowler stance (wide tandem) bilat without UE support for >60sec to demonstrate improved frontal plane postural control.    Baseline 01/20/22: 30sec bilat, partail hand support to perform on Rt side only    Time 12    Period Weeks    Status Revised    Target Date 03/17/22        PT LONG TERM GOAL #7   Title Patient will present with improved right knee AROM <10 deg knee ext from zero for decreased limp and leg length discrepancy to aide in walking and weight shifting.    Baseline 10/28/2021= 15 deg from zero (right knee ext) ; 7/20: 3 deg from zero    Time 12    Period Weeks    Status Achieved    Target Date 03/17/22       PT LONG TERM GOAL #8   Title Patient will demo modified Ind floor to sit transfer using chair for improved ability to get up off the floor for decreased caregiver or EMS Support.    Baseline 10/28/2021= Not formally assessed yet patient reports difficulty and desire to practice to be able to perform on her own.; 7/20: CGA and min VC/demo to complete floor to stand transfer, use of chair for UE support 9/19: able to complete floor to and from  stand transfer with min cueing   Time 12    Period Weeks    Status Ongoing     Target Date 03/17/22             Plan     Clinical Impression Statement Pt performed well with exercises and appreciated the education  provided during treatment session.  Pt notes that she is feeling much more confident with transitioning into Wellzone after d/c from therapy now.  Pt advised that therapy would prepare her for the transition and encourage her of the progress that she has made.   Pt will continue to benefit from skilled therapy to address remaining deficits in order to improve overall QoL and return to PLOF.        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, B THA x2, diastolic heart failure, back pain, tachycardia    Examination-Activity Limitations Bend;Reach Overhead;Stairs;Transfers;Lift;Squat;Locomotion Level;Carry;Stand;Dressing    Examination-Participation Restrictions Volunteer;Community Activity;Shop;Cleaning;Laundry;Yard Work    [employer]chant [employer] officer Evolving/Moderate complexity    Rehab Potential Good    PT Frequency 2x / week    PT Duration 12 weeks    PT Treatment/Interventions ADLs/Self Care Home Management;Aquatic Therapy;Biofeedback;Canalith Repostioning;Cryotherapy;Electrical Stimulation;Moist Heat;Traction;Ultrasound;DME Instruction;Gait training;Functional Biochemist, clinical;Therapeutic activities;Therapeutic exercise;Balance training;Neuromuscular re-education;Patient/family education;Orthotic Fit/Training;Wheelchair mobility training;Manual techniques;Passive range of motion;Scar mobilization;Dry needling;Energy conservation;Splinting;Taping;Vestibular;Visual/perceptual remediation/compensation;Joint Manipulations    PT Next Visit Plan Re-certification for next visit. Heavy front plane emphasis on hip strategy and ABDCT strength in functional context, cane practice and functional stability training        Consulted and Agree with Plan of Care Patient            Gwenlyn Saran, PT, DPT Physical Therapist- [facility]  04/14/22, 2:30 PM

## 2022-04-14 ENCOUNTER — Ambulatory Visit: Payer: Medicare Other

## 2022-04-14 DIAGNOSIS — M6281 Muscle weakness (generalized): Secondary | ICD-10-CM

## 2022-04-14 DIAGNOSIS — R262 Difficulty in walking, not elsewhere classified: Secondary | ICD-10-CM | POA: Diagnosis not present

## 2022-04-14 DIAGNOSIS — R269 Unspecified abnormalities of gait and mobility: Secondary | ICD-10-CM

## 2022-04-14 DIAGNOSIS — R2681 Unsteadiness on feet: Secondary | ICD-10-CM

## 2022-04-14 DIAGNOSIS — R2689 Other abnormalities of gait and mobility: Secondary | ICD-10-CM

## 2022-04-15 ENCOUNTER — Encounter: Payer: Self-pay | Admitting: Dermatology

## 2022-04-15 ENCOUNTER — Ambulatory Visit (INDEPENDENT_AMBULATORY_CARE_PROVIDER_SITE_OTHER): Payer: Medicare Other | Admitting: Dermatology

## 2022-04-15 DIAGNOSIS — D225 Melanocytic nevi of trunk: Secondary | ICD-10-CM

## 2022-04-15 DIAGNOSIS — Z1283 Encounter for screening for malignant neoplasm of skin: Secondary | ICD-10-CM

## 2022-04-15 DIAGNOSIS — L821 Other seborrheic keratosis: Secondary | ICD-10-CM

## 2022-04-15 DIAGNOSIS — D229 Melanocytic nevi, unspecified: Secondary | ICD-10-CM

## 2022-04-15 DIAGNOSIS — Z85828 Personal history of other malignant neoplasm of skin: Secondary | ICD-10-CM

## 2022-04-15 DIAGNOSIS — D171 Benign lipomatous neoplasm of skin and subcutaneous tissue of trunk: Secondary | ICD-10-CM

## 2022-04-15 DIAGNOSIS — L578 Other skin changes due to chronic exposure to nonionizing radiation: Secondary | ICD-10-CM

## 2022-04-15 DIAGNOSIS — L814 Other melanin hyperpigmentation: Secondary | ICD-10-CM

## 2022-04-15 DIAGNOSIS — L609 Nail disorder, unspecified: Secondary | ICD-10-CM

## 2022-04-15 DIAGNOSIS — R238 Other skin changes: Secondary | ICD-10-CM | POA: Diagnosis not present

## 2022-04-15 NOTE — Progress Notes (Signed)
New Patient Visit  Subjective  Mary [name] is a 81 y.o. female who presents for the following: Total body skin exam (Hx of BCC L lower leg txted ~7 yrs ago, txted in [place]). The patient presents for Total-Body Skin Exam (TBSE) for skin cancer screening and mole check.  The patient has spots, moles and lesions to be evaluated, some may be new or changing and the patient has concerns that these could be cancer.   The following portions of the chart were reviewed this encounter and updated as appropriate:   Tobacco  Allergies  Meds  Problems  Med Hx  Surg Hx  Fam Hx     Review of Systems:  No other skin or systemic complaints except as noted in HPI or Assessment and Plan.  Objective  Well appearing patient in no apparent distress; mood and affect are within normal limits.  A full examination was performed including scalp, head, eyes, ears, nose, lips, neck, chest, axillae, abdomen, back, buttocks, bilateral upper extremities, bilateral lower extremities, hands, feet, fingers, toes, fingernails, and toenails. All findings within normal limits unless otherwise noted below.  Right Ear Helix Purlple macule  R scapula Rubbery nodule 6.0cm  L great toenail Pt has polish on nail today   Assessment & Plan   Lentigines - Scattered tan macules - Due to sun exposure - Benign-appearing, observe - Recommend daily broad spectrum sunscreen SPF 30+ to sun-exposed areas, reapply every 2 hours as needed. - Call for any changes - upper back  Seborrheic Keratoses - Stuck-on, waxy, tan-brown papules and/or plaques  - Benign-appearing - Discussed benign etiology and prognosis. - Observe - Call for any changes - trunk, face  Melanocytic Nevi - Tan-brown and/or pink-flesh-colored symmetric macules and papules - Benign appearing on exam today - Observation - Call clinic for new or changing moles - Recommend daily use of broad spectrum spf 30+ sunscreen to sun-exposed areas.  -  trunk  Hemangiomas - Red papules - Discussed benign nature - Observe - Call for any changes - trunk  Actinic Damage - Chronic condition, secondary to cumulative UV/sun exposure - diffuse scaly erythematous macules with underlying dyspigmentation - Recommend daily broad spectrum sunscreen SPF 30+ to sun-exposed areas, reapply every 2 hours as needed.  - Staying in the shade or wearing long sleeves, sun glasses (UVA+UVB protection) and wide brim hats (4-inch brim around the entire circumference of the hat) are also recommended for sun protection.  - Call for new or changing lesions.  Skin cancer screening performed today.   [place] Right Ear Helix Benign-appearing.  Observation.  Call clinic for new or changing moles.  Recommend daily use of broad spectrum spf 30+ sunscreen to sun-exposed areas.    Lipoma of torso R scapula Benign, discussed excising  Nail problem L great toenail Tinea vs dystrophy vs other Discussed pt can remove polish and schedule f/u or send photo in my chart for evaluation  History of Basal Cell Carcinoma of the Skin - No evidence of recurrence today - Recommend regular full body skin exams - Recommend daily broad spectrum sunscreen SPF 30+ to sun-exposed areas, reapply every 2 hours as needed.  - Call if any new or changing lesions are noted between office visits  -L lower medial leg  Return in about 1 year (around 04/16/2023) for TBSE, Hx of BCC.  I, Othelia Pulling, RMA, am acting as scribe for Sarina Ser, MD . Documentation: I have reviewed the above documentation for accuracy and completeness, and  I agree with the above.  Sarina Ser, MD

## 2022-04-15 NOTE — Patient Instructions (Signed)
Due to recent changes in healthcare laws, you may see results of your pathology and/or laboratory studies on MyChart before the doctors have had a chance to review them. We understand that in some cases there may be results that are confusing or concerning to you. Please understand that not all results are received at the same time and often the doctors may need to interpret multiple results in order to provide you with the best plan of care or course of treatment. Therefore, we ask that you please give us 2 business days to thoroughly review all your results before contacting the office for clarification. Should we see a critical lab result, you will be contacted sooner.   If You Need Anything After Your Visit  If you have any questions or concerns for your doctor, please call our main line at [phone] and press option 4 to reach your doctor's medical assistant. If no one answers, please leave a voicemail as directed and we will return your call as soon as possible. Messages left after 4 pm will be answered the following business day.   You may also send us a message via MyChart. We typically respond to MyChart messages within 1-2 business days.  For prescription refills, please ask your pharmacy to contact our office. Our fax number is [phone].  If you have an urgent issue when the clinic is closed that cannot wait until the next business day, you can page your doctor at the number below.    Please note that while we do our best to be available for urgent issues outside of office hours, we are not available 24/7.   If you have an urgent issue and are unable to reach us, you may choose to seek medical care at your doctor's office, retail clinic, urgent care center, or emergency room.  If you have a medical emergency, please immediately call 911 or go to the emergency department.  Pager Numbers  - Dr. Kowalski: [phone]  - Dr. Moye: [phone]  - Dr. Stewart:  [phone]  In the event of inclement weather, please call our main line at [phone] for an update on the status of any delays or closures.  Dermatology Medication Tips: Please keep the boxes that topical medications come in in order to help keep track of the instructions about where and how to use these. Pharmacies typically print the medication instructions only on the boxes and not directly on the medication tubes.   If your medication is too expensive, please contact our office at [phone] option 4 or send us a message through MyChart.   We are unable to tell what your co-pay for medications will be in advance as this is different depending on your insurance coverage. However, we may be able to find a substitute medication at lower cost or fill out paperwork to get insurance to cover a needed medication.   If a prior authorization is required to get your medication covered by your insurance company, please allow us 1-2 business days to complete this process.  Drug prices often vary depending on where the prescription is filled and some pharmacies may offer cheaper prices.  The website www.goodrx.com contains coupons for medications through different pharmacies. The prices here do not account for what the cost may be with help from insurance (it may be cheaper with your insurance), but the website can give you the price if you did not use any insurance.  - You can print the associated coupon and take it with   your prescription to the pharmacy.  - You may also stop by our office during regular business hours and pick up a GoodRx coupon card.  - If you need your prescription sent electronically to a different pharmacy, notify our office through [facility] MyChart or by phone at [phone] option 4.     Si Usted Necesita Algo Despus de Su Visita  Tambin puede enviarnos un mensaje a travs de MyChart. Por lo general respondemos a los mensajes de MyChart en el transcurso de 1 a 2  das hbiles.  Para renovar recetas, por favor pida a su farmacia que se ponga en contacto con nuestra oficina. Nuestro nmero de fax es el [phone].  Si tiene un asunto urgente cuando la clnica est cerrada y que no puede esperar hasta el siguiente da hbil, puede llamar/localizar a su doctor(a) al nmero que aparece a continuacin.   Por favor, tenga en cuenta que aunque hacemos todo lo posible para estar disponibles para asuntos urgentes fuera del horario de oficina, no estamos disponibles las 24 horas del da, los 7 das de la semana.   Si tiene un problema urgente y no puede comunicarse con nosotros, puede optar por buscar atencin mdica  en el consultorio de su doctor(a), en una clnica privada, en un centro de atencin urgente o en una sala de emergencias.  Si tiene una emergencia mdica, por favor llame inmediatamente al 911 o vaya a la sala de emergencias.  Nmeros de bper  - Dr. Kowalski: [phone]  - Dra. Moye: [phone]  - Dra. Stewart: [phone]  En caso de inclemencias del tiempo, por favor llame a nuestra lnea principal al [phone] para una actualizacin sobre el estado de cualquier retraso o cierre.  Consejos para la medicacin en dermatologa: Por favor, guarde las cajas en las que vienen los medicamentos de uso tpico para ayudarle a seguir las instrucciones sobre dnde y cmo usarlos. Las farmacias generalmente imprimen las instrucciones del medicamento slo en las cajas y no directamente en los tubos del medicamento.   Si su medicamento es muy caro, por favor, pngase en contacto con nuestra oficina llamando al [phone] y presione la opcin 4 o envenos un mensaje a travs de MyChart.   No podemos decirle cul ser su copago por los medicamentos por adelantado ya que esto es diferente dependiendo de la cobertura de su seguro. Sin embargo, es posible que podamos encontrar un medicamento sustituto a menor costo o llenar un formulario para que el  seguro cubra el medicamento que se considera necesario.   Si se requiere una autorizacin previa para que su compaa de seguros cubra su medicamento, por favor permtanos de 1 a 2 das hbiles para completar este proceso.  Los precios de los medicamentos varan con frecuencia dependiendo del lugar de dnde se surte la receta y alguna farmacias pueden ofrecer precios ms baratos.  El sitio web www.goodrx.com tiene cupones para medicamentos de diferentes farmacias. Los precios aqu no tienen en cuenta lo que podra costar con la ayuda del seguro (puede ser ms barato con su seguro), pero el sitio web puede darle el precio si no utiliz ningn seguro.  - Puede imprimir el cupn correspondiente y llevarlo con su receta a la farmacia.  - Tambin puede pasar por nuestra oficina durante el horario de atencin regular y recoger una tarjeta de cupones de GoodRx.  - Si necesita que su receta se enve electrnicamente a una farmacia diferente, informe a nuestra oficina a travs de MyChart de [place]   o por telfono llamando al [phone] y presione la opcin 4.  

## 2022-04-19 ENCOUNTER — Encounter: Payer: Medicare Other | Admitting: Occupational Therapy

## 2022-04-19 ENCOUNTER — Ambulatory Visit: Payer: Medicare Other | Admitting: Physical Therapy

## 2022-04-19 ENCOUNTER — Encounter: Payer: Self-pay | Admitting: Physical Therapy

## 2022-04-19 DIAGNOSIS — M6281 Muscle weakness (generalized): Secondary | ICD-10-CM

## 2022-04-19 DIAGNOSIS — R2689 Other abnormalities of gait and mobility: Secondary | ICD-10-CM

## 2022-04-19 DIAGNOSIS — R262 Difficulty in walking, not elsewhere classified: Secondary | ICD-10-CM

## 2022-04-19 DIAGNOSIS — R2681 Unsteadiness on feet: Secondary | ICD-10-CM

## 2022-04-19 NOTE — Therapy (Signed)
OUTPATIENT PHYSICAL THERAPY TREATMENT NOTE    Patient Name: Mary [name] MRN: [id number] DOB:June 17, 1940, 81 y.o., female Today's Date: 04/19/2022  PCP: Valerie Roys, DO REFERRING PROVIDER: Minna Merritts, MD   PT End of Session - 04/19/22 1516     Visit Number 55    Number of Visits 24    Date for PT Re-Evaluation 06/07/22    Authorization Type Medicare Primary, AARP Secondary    Authorization Time Period 01/20/22-04/14/22    Progress Note Due on Visit 2    PT Start Time 1516    PT Stop Time 1557    PT Time Calculation (min) 41 min    Equipment Utilized During Treatment Gait belt    Activity Tolerance Patient tolerated treatment well;No increased pain    Behavior During Therapy WFL for tasks assessed/performed                 Past Medical History:  Diagnosis Date   Arrhythmia    Back pain    Basal cell carcinoma    L lower leg txted in [place] ~ 2016   Depression with anxiety    Hip pain    Hyperlipidemia    Hypertension    Neuromuscular disorder ([place])    Paroxysmal tachycardia ([place])    Past Surgical History:  Procedure Laterality Date   ABDOMINAL HYSTERECTOMY     BREAST BIOPSY     BREAST EXCISIONAL BIOPSY Left 2002   benign   CARDIAC CATHETERIZATION     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   x2   TOTAL HIP ARTHROPLASTY Bilateral    done twice   Patient Active Problem List   Diagnosis Date Noted   Insomnia 12/16/2021   Depression, recurrent ([place]) 94/70/9628   Diastolic heart failure ([place]) 07/30/2021   Hyperlipidemia 07/30/2021   Lymphedema 07/30/2021   Gait instability 06/02/2021   Primary hypertension 06/02/2021    REFERRING DIAG: R26.81 (ICD-10-CM) - Gait instability  THERAPY DIAG:  Difficulty in walking, not elsewhere classified  Muscle weakness (generalized)  Other abnormalities of gait and mobility  Unsteadiness on feet  Rationale for Evaluation and Treatment Rehabilitation  PERTINENT HISTORY: Pt is a pleasant 81 y/o female  referred to PT for gait instability. Pt ambulates with RW. She reports onset of gait instability started several years ago. 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 Pt has had PT in past following surgeries. Pt reports hx of frequent falls. She has had 2 falls in past 6 months. Pt believes falls are due to difficulty picking up L foot and dragging L foot. Pt reports no injury with falls.  Pt being seen by OT for lymphedema, reports LLE more affected than RLE. Pt currently lives alone. Her spouse passed away recently in 2022/07/01. Her adult son and DIL live nearby and can assist her. Pt denies any dizziness or lightheadedness. Pt reports no pain currently. 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   PRECAUTIONS: N/A  SUBJECTIVE:   Pt reports she is ready to behind therapy and attempt machines in the [facility] for progression following therapy POC.  Otherwise pt notes she is doing well.   PAIN:  Are you having pain? No   TODAY'S TREATMENT 04/19/22  Wellzone: NuStep level 3 x 5 minutes.  Patient educated regarding how to properly adjust NuStep including seat height and  resistance for proper work-up when performing independently.  Patient required frequent verbal cues for proper positioning and proper set up of the 3 below exercises Hoist leg press, 6 plates, 3x10 Hoist leg extension, 3 plates, 3x10 Hoist hamstring curl, 4 plates, 3x10  Gave pt instruction and education on different services offered in the [facility] with pt noting that she felt much more at ease with transition to the gym following therapy discharge.  Patient ambulates with a straight point cane in right upper extremity from treatment area to well zone for approximate distance of 150 feet.  Patient provided with cues for proper sequencing of cane but this is improving patient  requires cues very infrequently.  Patient practiced with curb training on 3 inch and 4 inch curbs.  Patient requires cues for sequencing but following cues and practice patient's efficacy did improve.  Rationale for Evaluation and Treatment Rehabilitation    PATIENT EDUCATION: Education details: Patient educated extensively regarding proper utilization of cane as well as demonstration of why proper use of cane will improve her ability to ambulate independently Person educated: Patient Education method: Explanation, Demonstration, and Verbal cues Education comprehension: verbalized understanding, returned demonstration, verbal cues required, and needs further education   HOME EXERCISE PROGRAM:  no updates as of 12/17/21 08/04/2021: Access Code: [email or web address] 09/28/2021 Access Code: [email or web address] 10/28/2021 Access Code: [email or web address]   01/11/2022 Access Code: [email or web address]   PT Short Term Goals      PT SHORT TERM GOAL #1   Title Pt will be independent with HEP in order to improve strength and balance in order to decrease fall risk and improve function at home and work.    Baseline 3/7: provided pt with initial HEP 4/5; compliant    Time 6    Period Weeks    Status Achieved    Target Date 09/15/21              PT Long Term Goals       PT LONG TERM GOAL #1   Title Patient will increase FOTO score to equal to or greater than 62 to demonstrate statistically significant improvement in mobility and quality of life.    Baseline 3/7: 50 4/5: 55%; 10/28/2021=53%; 8/23: 58   Time 12    Period Weeks    Status Revise    Target Date 03/17/22      PT LONG TERM GOAL #2   Title Pt will improve BERG to >49 in order to demonstrate clinically significant improvement in balance.    Baseline 3/7: 40/56 4/5: 44/56; 01/20/22: 47 02/16/22: ongoing    Time 12    Period Weeks    Status Revise    Target Date 03/17/22      PT LONG TERM GOAL #3   Title Pt will decrease 5TSTS by at least 3 seconds in order to  demonstrate clinically significant improvement in LE strength.    Baseline 3/7: 15.36 sec with use of BUEs to assist 4/5: 9.6 seconds SUE support    Time 12    Period Weeks    Status Achieved    Target Date 03/17/22      PT LONG TERM GOAL #4   Title Pt will improve ABC by at least 13% in order to demonstrate clinically significant improvement in balance confidence.    Baseline 3/7: 50.63% 4/5: 62% 5/31= 63%; 7/20: 70.31%   Time 12    Period Weeks    Status Achieved   Target Date 03/17/22  PT LONG TERM GOAL #5   Title Patient will increase 10 meter walk test to >1.76ms as to improve gait speed for better community ambulation and to reduce fall risk.    Baseline 3/7: 0.75 m/s with 4WW 4/5: 0.96 m/s with 4 WW; 10/28/2021= 0.97 m/s with 41GG 7/20: 0.96 m/s with 4WW   Time 12    Period Weeks    Status Partially Met    Target Date 03/17/22        Additional Long Term Goals   Additional Long Term Goals Yes      PT LONG TERM GOAL #6   Title Patient will demonstrate improved balance by exhibiting ability to bowler stance (wide tandem) bilat without UE support for >60sec to demonstrate improved frontal plane postural control.    Baseline 01/20/22: 30sec bilat, partail hand support to perform on Rt side only    Time 12    Period Weeks    Status Revised    Target Date 03/17/22        PT LONG TERM GOAL #7   Title Patient will present with improved right knee AROM <10 deg knee ext from zero for decreased limp and leg length discrepancy to aide in walking and weight shifting.    Baseline 10/28/2021= 15 deg from zero (right knee ext) ; 7/20: 3 deg from zero    Time 12    Period Weeks    Status Achieved    Target Date 03/17/22       PT LONG TERM GOAL #8   Title Patient will demo modified Ind floor to sit transfer using chair for improved ability to get up off the floor for decreased caregiver or EMS Support.    Baseline 10/28/2021= Not formally assessed yet patient reports difficulty  and desire to practice to be able to perform on her own.; 7/20: CGA and min VC/demo to complete floor to stand transfer, use of chair for UE support 9/19: able to complete floor to and from  stand transfer with min cueing   Time 12    Period Weeks    Status Ongoing     Target Date 03/17/22             Plan     Clinical Impression Statement Patient presents with excellent motivation for completion of physical therapy activities.  Patient continues to be educated regarding proper use of well zone equipment as patient is still very uncomfortable with completing many of these independently.  Patient requires moderate verbal cues for proper adjustment and sequencing for getting on and off equipment as well as setting up equipment for optimal muscular strength training.  Patient continue to progress with curb training can patient still is uncomfortable performing curves higher than 3 inches when she only has her cane for assistance.Pt will continue to benefit from skilled physical therapy intervention to address impairments, improve QOL, and attain therapy goals.       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, B THA x2, diastolic heart failure, back pain, tachycardia    Examination-Activity Limitations Bend;Reach Overhead;Stairs;Transfers;Lift;Squat;Locomotion Level;Carry;Stand;Dressing    Examination-Participation Restrictions Volunteer;Community Activity;Shop;Cleaning;Laundry;Yard Work    SMerchant navy officerEvolving/Moderate complexity    Rehab Potential Good    PT Frequency 2x / week    PT Duration 12 weeks    PT Treatment/Interventions ADLs/Self Care Home Management;Aquatic Therapy;Biofeedback;Canalith Repostioning;Cryotherapy;Electrical Stimulation;Moist Heat;Traction;Ultrasound;DME Instruction;Gait training;Functional mResearch scientist (physical sciences)Therapeutic activities;Therapeutic  exercise;Balance training;Neuromuscular re-education;Patient/family education;Orthotic Fit/Training;Wheelchair mobility training;Manual techniques;Passive range of motion;Scar mobilization;Dry needling;Energy conservation;Splinting;Taping;Vestibular;Visual/perceptual remediation/compensation;Joint Manipulations    PT Next Visit Plan Re-certification for next visit. Heavy front plane emphasis on hip strategy and ABDCT strength in functional context, cane practice and functional stability training        Consulted and Agree with Plan of Care Patient            Particia Lather PT  04/19/22, 5:27 PM

## 2022-04-21 ENCOUNTER — Ambulatory Visit: Payer: Medicare Other

## 2022-04-21 ENCOUNTER — Other Ambulatory Visit: Payer: Self-pay | Admitting: Family Medicine

## 2022-04-21 NOTE — Telephone Encounter (Signed)
Requested Prescriptions  Pending Prescriptions Disp Refills   traZODone (DESYREL) 50 MG tablet [Pharmacy Med Name: TRAZODONE '50MG'$  TABLETS] 30 tablet 1    Sig: TAKE 1/2 TO 1 TABLET(25 TO 50 MG) BY MOUTH AT BEDTIME AS NEEDED FOR SLEEP     Psychiatry: Antidepressants - Serotonin Modulator Passed - 04/21/2022  3:27 AM      Passed - Completed PHQ-2 or PHQ-9 in the last 360 days      Passed - Valid encounter within last 6 months    Recent Outpatient Visits           3 months ago Snoring   Magee P, DO   4 months ago Depression, recurrent ([place])   [place], Megan P, DO   8 months ago Diastolic heart failure, unspecified HF chronicity ([place])   Jackson, MD   9 months ago [place], Santiago Glad, NP   9 months ago Acute pain of left knee   [facility] Charlynne Cousins, MD       Future Appointments             In 1 month Johnson, Barb Merino, DO [employer], [place]   In 1 year Ralene Bathe, MD [place]

## 2022-04-26 ENCOUNTER — Ambulatory Visit: Payer: Medicare Other | Admitting: Physical Therapy

## 2022-04-28 ENCOUNTER — Ambulatory Visit: Payer: Medicare Other | Admitting: Physical Therapy

## 2022-04-28 DIAGNOSIS — M6281 Muscle weakness (generalized): Secondary | ICD-10-CM

## 2022-04-28 DIAGNOSIS — R2681 Unsteadiness on feet: Secondary | ICD-10-CM

## 2022-04-28 DIAGNOSIS — R2689 Other abnormalities of gait and mobility: Secondary | ICD-10-CM

## 2022-04-28 DIAGNOSIS — R262 Difficulty in walking, not elsewhere classified: Secondary | ICD-10-CM

## 2022-04-28 NOTE — Therapy (Signed)
OUTPATIENT PHYSICAL THERAPY TREATMENT NOTE    Patient Name: Mary [name] MRN: [id number] DOB:1940-09-21, 81 y.o., female Today's Date: 04/28/2022  PCP: Valerie Roys, DO REFERRING PROVIDER: Minna Merritts, MD         Past Medical History:  Diagnosis Date   Arrhythmia    Back pain    Basal cell carcinoma    L lower leg txted in [place] ~ 2016   Depression with anxiety    Hip pain    Hyperlipidemia    Hypertension    Neuromuscular disorder ([place])    Paroxysmal tachycardia ([place])    Past Surgical History:  Procedure Laterality Date   ABDOMINAL HYSTERECTOMY     BREAST BIOPSY     BREAST EXCISIONAL BIOPSY Left 2002   benign   CARDIAC CATHETERIZATION     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   x2   TOTAL HIP ARTHROPLASTY Bilateral    done twice   Patient Active Problem List   Diagnosis Date Noted   Insomnia 12/16/2021   Depression, recurrent ([place]) 69/79/4801   Diastolic heart failure ([place]) 07/30/2021   Hyperlipidemia 07/30/2021   Lymphedema 07/30/2021   Gait instability 06/02/2021   Primary hypertension 06/02/2021    REFERRING DIAG: R26.81 (ICD-10-CM) - Gait instability  THERAPY DIAG:  Difficulty in walking, not elsewhere classified  Muscle weakness (generalized)  Other abnormalities of gait and mobility  Unsteadiness on feet  Rationale for Evaluation and Treatment Rehabilitation  PERTINENT HISTORY: Pt is a pleasant 81 y/o female referred to PT for gait instability. Pt ambulates with RW. She reports onset of gait instability started several years ago. 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 Pt has had PT in past following surgeries. Pt reports hx of frequent falls. She has had 2 falls in past 6 months. Pt believes falls are due to difficulty picking up L foot and dragging L foot. Pt reports no injury with falls.  Pt being seen by OT for lymphedema, reports LLE more affected than  RLE. Pt currently lives alone. Her spouse passed away recently in 06/19/22. Her adult son and DIL live nearby and can assist her. Pt denies any dizziness or lightheadedness. Pt reports no pain currently. 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   PRECAUTIONS: N/A  SUBJECTIVE:   Pt reports she is ready to behind therapy and attempt machines in the [facility] for progression following therapy POC.  Otherwise pt notes she is doing well.   PAIN:  Are you having pain? No   TODAY'S TREATMENT 04/28/22  Wellzone:   Patient required frequent verbal cues for proper positioning and proper set up of the 3 below exercises Hoist leg press, 6 plates, 3x10, instruction in proper technique and set up of machine. Manual assistance for LE placement and to obtain appropriate depth for leg press. Min assistance with leg extension and flexion machines but some verbal and tactile cues required.  Hoist leg extension, 3 plates, 3x10 Hoist hamstring curl, 4 plates, 3x10  Gave pt instruction and education on different services offered in the [facility] with pt noting that she felt much more at ease with transition to the gym following therapy discharge.  Patient ambulates with a straight point cane in right upper extremity to/from treatment area to well zone for approximate distance of 150 feet.  Patient provided with cues for  proper sequencing of cane but this is improving patient requires cues very infrequently.  Patient practiced with curb training on 3 inch, 4 and 5  inch curbs.  Patient requires cues for sequencing but following cues and practice patient's efficacy did improve. Pt requires some assistance from PT to safely manage 5 inch and pt has most difficulty with placing LE on step. Pt uses personal SPC.   High march x 10 ea LE with unilateral UE support for part to whole practice of curb training with cane. Pt instructed to perfom this with  her HEP   Rationale for Evaluation and Treatment Rehabilitation    PATIENT EDUCATION: Education details: Patient educated extensively regarding proper utilization of cane as well as demonstration of why proper use of cane will improve her ability to ambulate independently Person educated: Patient Education method: Explanation, Demonstration, and Verbal cues Education comprehension: verbalized understanding, returned demonstration, verbal cues required, and needs further education   HOME EXERCISE PROGRAM:  no updates as of 12/17/21 08/04/2021: Access Code: [email or web address] 09/28/2021 Access Code: [email or web address] 10/28/2021 Access Code: [email or web address]   01/11/2022 Access Code: [email or web address]   PT Short Term Goals      PT SHORT TERM GOAL #1   Title Pt will be independent with HEP in order to improve strength and balance in order to decrease fall risk and improve function at home and work.    Baseline 3/7: provided pt with initial HEP 4/5; compliant    Time 6    Period Weeks    Status Achieved    Target Date 09/15/21              PT Long Term Goals       PT LONG TERM GOAL #1   Title Patient will increase FOTO score to equal to or greater than 62 to demonstrate statistically significant improvement in mobility and quality of life.    Baseline 3/7: 50 4/5: 55%; 10/28/2021=53%; 8/23: 58   Time 12    Period Weeks    Status Revise    Target Date 03/17/22      PT LONG TERM GOAL #2   Title Pt will improve BERG to >49 in order to demonstrate clinically significant improvement in balance.    Baseline 3/7: 40/56 4/5: 44/56; 01/20/22: 47 02/16/22: ongoing    Time 12    Period Weeks    Status Revise    Target Date 03/17/22      PT LONG TERM GOAL #3   Title Pt will decrease 5TSTS by at least 3 seconds in order to demonstrate clinically significant improvement in LE strength.    Baseline 3/7: 15.36 sec with use of BUEs to assist 4/5: 9.6 seconds SUE support    Time 12    Period Weeks    Status Achieved     Target Date 03/17/22      PT LONG TERM GOAL #4   Title Pt will improve ABC by at least 13% in order to demonstrate clinically significant improvement in balance confidence.    Baseline 3/7: 50.63% 4/5: 62% 5/31= 63%; 7/20: 70.31%   Time 12    Period Weeks    Status Achieved   Target Date 03/17/22      PT LONG TERM GOAL #5   Title Patient will increase 10 meter walk test to >1.73ms as to improve gait speed for better community ambulation and to reduce fall risk.    Baseline 3/7: 0.75 m/s with 4WW 4/5: 0.96 m/s with 4 WW;  10/28/2021= 0.97 m/s with 4WW; 7/20: 0.96 m/s with 4WW   Time 12    Period Weeks    Status Partially Met    Target Date 03/17/22        Additional Long Term Goals   Additional Long Term Goals Yes      PT LONG TERM GOAL #6   Title Patient will demonstrate improved balance by exhibiting ability to bowler stance (wide tandem) bilat without UE support for >60sec to demonstrate improved frontal plane postural control.    Baseline 01/20/22: 30sec bilat, partail hand support to perform on Rt side only    Time 12    Period Weeks    Status Revised    Target Date 03/17/22        PT LONG TERM GOAL #7   Title Patient will present with improved right knee AROM <10 deg knee ext from zero for decreased limp and leg length discrepancy to aide in walking and weight shifting.    Baseline 10/28/2021= 15 deg from zero (right knee ext) ; 7/20: 3 deg from zero    Time 12    Period Weeks    Status Achieved    Target Date 03/17/22       PT LONG TERM GOAL #8   Title Patient will demo modified Ind floor to sit transfer using chair for improved ability to get up off the floor for decreased caregiver or EMS Support.    Baseline 10/28/2021= Not formally assessed yet patient reports difficulty and desire to practice to be able to perform on her own.; 7/20: CGA and min VC/demo to complete floor to stand transfer, use of chair for UE support 9/19: able to complete floor to and from  stand  transfer with min cueing   Time 12    Period Weeks    Status Ongoing     Target Date 03/17/22             Plan     Clinical Impression Statement Patient presents with excellent motivation for completion of physical therapy activities.  Patient continues to be educated regarding proper use of well zone equipment as patient is still very uncomfortable with completing many of these independently.  Patient requires moderate verbal cues for proper adjustment and sequencing for getting on and off equipment as well as setting up equipment for optimal muscular strength training.  Patient continue to progress with curb training can patient still is uncomfortable performing curves higher than 4 inches when she only has her cane for assistance. Pt will continue to benefit from skilled physical therapy intervention to address impairments, improve QOL, and attain therapy goals.       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, B THA x2, diastolic heart failure, back pain, tachycardia    Examination-Activity Limitations Bend;Reach Overhead;Stairs;Transfers;Lift;Squat;Locomotion Level;Carry;Stand;Dressing    Examination-Participation Restrictions Volunteer;Community Activity;Shop;Cleaning;Laundry;Yard Work    [employer]chant [employer] officer Evolving/Moderate complexity    Rehab Potential Good    PT Frequency 2x / week    PT Duration 12 weeks    PT Treatment/Interventions ADLs/Self Care Home Management;Aquatic Therapy;Biofeedback;Canalith Repostioning;Cryotherapy;Electrical Stimulation;Moist Heat;Traction;Ultrasound;DME Instruction;Gait training;Functional Biochemist, clinical;Therapeutic activities;Therapeutic exercise;Balance training;Neuromuscular re-education;Patient/family education;Orthotic Fit/Training;Wheelchair mobility training;Manual techniques;Passive  range of motion;Scar mobilization;Dry needling;Energy conservation;Splinting;Taping;Vestibular;Visual/perceptual remediation/compensation;Joint Manipulations    PT Next Visit Plan Re-certification for next visit. Heavy front plane emphasis on hip strategy and ABDCT strength in functional context, cane practice and functional stability  training        Consulted and Agree with Plan of Care Patient            Particia Lather PT  04/28/22, 1:06 PM

## 2022-05-03 ENCOUNTER — Encounter: Payer: Self-pay | Admitting: Physical Therapy

## 2022-05-03 ENCOUNTER — Ambulatory Visit: Payer: Medicare Other | Attending: Cardiovascular Disease | Admitting: Physical Therapy

## 2022-05-03 ENCOUNTER — Encounter: Payer: Self-pay | Admitting: Dermatology

## 2022-05-03 DIAGNOSIS — M6281 Muscle weakness (generalized): Secondary | ICD-10-CM | POA: Diagnosis present

## 2022-05-03 DIAGNOSIS — R2689 Other abnormalities of gait and mobility: Secondary | ICD-10-CM | POA: Diagnosis present

## 2022-05-03 DIAGNOSIS — R269 Unspecified abnormalities of gait and mobility: Secondary | ICD-10-CM | POA: Insufficient documentation

## 2022-05-03 DIAGNOSIS — R2681 Unsteadiness on feet: Secondary | ICD-10-CM | POA: Diagnosis present

## 2022-05-03 DIAGNOSIS — R262 Difficulty in walking, not elsewhere classified: Secondary | ICD-10-CM | POA: Insufficient documentation

## 2022-05-03 NOTE — Therapy (Signed)
OUTPATIENT PHYSICAL THERAPY TREATMENT NOTE    Patient Name: Mary [name] MRN: [id number] DOB:[birth year], 81 y.o., female Today's Date: 05/03/2022  PCP: Valerie Roys, DO REFERRING PROVIDER: Minna Merritts, MD   PT End of Session - 05/03/22 1323     Visit Number 56    Number of Visits 29    Date for PT Re-Evaluation 06/07/22    Authorization Type Medicare Primary, [employer] Secondary    Authorization Time Period 12/23/34-64/40/34 cert through 12/02/23    Progress Note Due on Visit 58    PT Start Time 1307    PT Stop Time 1344    PT Time Calculation (min) 37 min    Equipment Utilized During Treatment Gait belt    Activity Tolerance Patient tolerated treatment well;No increased pain    Behavior During Therapy WFL for tasks assessed/performed                  Past Medical History:  Diagnosis Date   Arrhythmia    Back pain    Basal cell carcinoma    L lower leg txted in [place] ~ 2016   Depression with anxiety    Hip pain    Hyperlipidemia    Hypertension    Neuromuscular disorder ([place])    Paroxysmal tachycardia ([place])    Past Surgical History:  Procedure Laterality Date   ABDOMINAL HYSTERECTOMY     BREAST BIOPSY     BREAST EXCISIONAL BIOPSY Left 2002   benign   CARDIAC CATHETERIZATION     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   x2   TOTAL HIP ARTHROPLASTY Bilateral    done twice   Patient Active Problem List   Diagnosis Date Noted   Insomnia 12/16/2021   Depression, recurrent ([place]) 95/63/8756   Diastolic heart failure ([place]) 07/30/2021   Hyperlipidemia 07/30/2021   Lymphedema 07/30/2021   Gait instability 06/02/2021   Primary hypertension 06/02/2021    REFERRING DIAG: R26.81 (ICD-10-CM) - Gait instability  THERAPY DIAG:  Difficulty in walking, not elsewhere classified  Other abnormalities of gait and mobility  Unsteadiness on feet  Abnormality of gait and mobility  Muscle weakness (generalized)  Rationale for Evaluation and Treatment  Rehabilitation  PERTINENT HISTORY: Pt is a pleasant 81 y/o female referred to PT for gait instability. Pt ambulates with RW. She reports onset of gait instability started several years ago. 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 Pt has had PT in past following surgeries. Pt reports hx of frequent falls. She has had 2 falls in past 6 months. Pt believes falls are due to difficulty picking up L foot and dragging L foot. Pt reports no injury with falls.  Pt being seen by OT for lymphedema, reports LLE more affected than RLE. Pt currently lives alone. Her spouse passed away recently in 06/26/2022. Her adult son and DIL live nearby and can assist her. Pt denies any dizziness or lightheadedness. Pt reports no pain currently. 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   PRECAUTIONS: N/A  SUBJECTIVE:   Pt reports she is ready to behind therapy and attempt machines in the [facility] for progression following therapy POC.  Otherwise pt notes she is doing well.   PAIN:  Are you having pain? No   TODAY'S TREATMENT 05/03/22  Wellzone:   Patient required frequent verbal cues for proper  positioning and proper set up of the 3 below exercises Hoist leg press, 6 plates, 3x10, instruction in proper technique and set up of machine. Manual assistance for LE placement and to obtain appropriate depth for leg press. Min assistance with leg extension and flexion machines but some verbal and tactile cues required.  Pt instructed in alternates to this activity as set up is very intense for this activity secondary to hip range of motion limitations. Pt instructed in squats using treadmill for support and with TRX squats, completes 2 x 10 of each with fading frequency cues to improve pt independence. Pt instructed in adjustment of TRX straps  Hoist leg extension, 3 plates,  3x10 Hoist hamstring curl, 4 plates, 3x10  Gave pt instruction and education on different services offered in the [facility] with pt noting that she felt much more at ease with transition to the gym following therapy discharge.  Curb training with 3 step trianiers in a row in wellzone, completes 4 times through. No LOB noted but still not confident with higher curbs with cane.  Rationale for Evaluation and Treatment Rehabilitation    PATIENT EDUCATION: Education details: Patient educated extensively regarding proper utilization of cane as well as demonstration of why proper use of cane will improve her ability to ambulate independently Person educated: Patient Education method: Explanation, Demonstration, and Verbal cues Education comprehension: verbalized understanding, returned demonstration, verbal cues required, and needs further education   HOME EXERCISE PROGRAM:  no updates as of 12/17/21 08/04/2021: Access Code: [email or web address] 09/28/2021 Access Code: [email or web address] 10/28/2021 Access Code: [email or web address]   01/11/2022 Access Code: [email or web address]   PT Short Term Goals      PT SHORT TERM GOAL #1   Title Pt will be independent with HEP in order to improve strength and balance in order to decrease fall risk and improve function at home and work.    Baseline 3/7: provided pt with initial HEP 4/5; compliant    Time 6    Period Weeks    Status Achieved    Target Date 09/15/21              PT Long Term Goals       PT LONG TERM GOAL #1   Title Patient will increase FOTO score to equal to or greater than 62 to demonstrate statistically significant improvement in mobility and quality of life.    Baseline 3/7: 50 4/5: 55%; 10/28/2021=53%; 8/23: 58   Time 12    Period Weeks    Status Revise    Target Date 03/17/22      PT LONG TERM GOAL #2   Title Pt will improve BERG to >49 in order to demonstrate clinically significant improvement in balance.    Baseline 3/7: 40/56 4/5: 44/56; 01/20/22: 47 02/16/22:  ongoing    Time 12    Period Weeks    Status Revise    Target Date 03/17/22      PT LONG TERM GOAL #3   Title Pt will decrease 5TSTS by at least 3 seconds in order to demonstrate clinically significant improvement in LE strength.    Baseline 3/7: 15.36 sec with use of BUEs to assist 4/5: 9.6 seconds SUE support    Time 12    Period Weeks    Status Achieved    Target Date 03/17/22      PT LONG TERM GOAL #4   Title Pt will improve ABC by at least 13% in order to demonstrate clinically significant improvement in balance  confidence.    Baseline 3/7: 50.63% 4/5: 62% 5/31= 63%; 7/20: 70.31%   Time 12    Period Weeks    Status Achieved   Target Date 03/17/22      PT LONG TERM GOAL #5   Title Patient will increase 10 meter walk test to >1.53ms as to improve gait speed for better community ambulation and to reduce fall risk.    Baseline 3/7: 0.75 m/s with 4WW 4/5: 0.96 m/s with 4 WW; 10/28/2021= 0.97 m/s with 48EX 7/20: 0.96 m/s with 4WW   Time 12    Period Weeks    Status Partially Met    Target Date 03/17/22        Additional Long Term Goals   Additional Long Term Goals Yes      PT LONG TERM GOAL #6   Title Patient will demonstrate improved balance by exhibiting ability to bowler stance (wide tandem) bilat without UE support for >60sec to demonstrate improved frontal plane postural control.    Baseline 01/20/22: 30sec bilat, partail hand support to perform on Rt side only    Time 12    Period Weeks    Status Revised    Target Date 03/17/22        PT LONG TERM GOAL #7   Title Patient will present with improved right knee AROM <10 deg knee ext from zero for decreased limp and leg length discrepancy to aide in walking and weight shifting.    Baseline 10/28/2021= 15 deg from zero (right knee ext) ; 7/20: 3 deg from zero    Time 12    Period Weeks    Status Achieved    Target Date 03/17/22       PT LONG TERM GOAL #8   Title Patient will demo modified Ind floor to sit transfer  using chair for improved ability to get up off the floor for decreased caregiver or EMS Support.    Baseline 10/28/2021= Not formally assessed yet patient reports difficulty and desire to practice to be able to perform on her own.; 7/20: CGA and min VC/demo to complete floor to stand transfer, use of chair for UE support 9/19: able to complete floor to and from  stand transfer with min cueing   Time 12    Period Weeks    Status Ongoing     Target Date 03/17/22             Plan     Clinical Impression Statement Patient presents with excellent motivation for completion of physical therapy activities.  Patient continues to be educated regarding proper use of well zone equipment as patient is still uncomfortable with completing many of these independently. Patient requires moderate verbal cues for proper adjustment and sequencing for getting on and off equipment as well as setting up equipment for optimal muscular strength training. Pt provided with alternatives for leg press including air squat with UE support and TRX squat and both of these she required cues for proper performance but was able to complete with increased independence compared to leg press. Patient continue to progress with curb training can patient still is uncomfortable performing curves higher than 4 inches when she only has her cane for assistance. Pt will continue to benefit from skilled physical therapy intervention to address impairments, improve QOL, and attain therapy goals.       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, B  THA x2, diastolic heart failure, back pain, tachycardia    Examination-Activity Limitations Bend;Reach Overhead;Stairs;Transfers;Lift;Squat;Locomotion Level;Carry;Stand;Dressing    Examination-Participation Restrictions Volunteer;Community  Activity;Shop;Cleaning;Laundry;Yard Work    [employer]chant [employer] officer Evolving/Moderate complexity    Rehab Potential Good    PT Frequency 2x / week    PT Duration 12 weeks    PT Treatment/Interventions ADLs/Self Care Home Management;Aquatic Therapy;Biofeedback;Canalith Repostioning;Cryotherapy;Electrical Stimulation;Moist Heat;Traction;Ultrasound;DME Instruction;Gait training;Functional Biochemist, clinical;Therapeutic activities;Therapeutic exercise;Balance training;Neuromuscular re-education;Patient/family education;Orthotic Fit/Training;Wheelchair mobility training;Manual techniques;Passive range of motion;Scar mobilization;Dry needling;Energy conservation;Splinting;Taping;Vestibular;Visual/perceptual remediation/compensation;Joint Manipulations    PT Next Visit Plan Re-certification for next visit. Heavy front plane emphasis on hip strategy and ABDCT strength in functional context, cane practice and functional stability training        Consulted and Agree with Plan of Care Patient            Particia Lather PT  05/03/22, 3:03 PM

## 2022-05-05 ENCOUNTER — Ambulatory Visit: Payer: Medicare Other | Admitting: Physical Therapy

## 2022-05-05 ENCOUNTER — Encounter: Payer: Self-pay | Admitting: Physical Therapy

## 2022-05-05 DIAGNOSIS — R2689 Other abnormalities of gait and mobility: Secondary | ICD-10-CM

## 2022-05-05 DIAGNOSIS — R2681 Unsteadiness on feet: Secondary | ICD-10-CM

## 2022-05-05 DIAGNOSIS — R269 Unspecified abnormalities of gait and mobility: Secondary | ICD-10-CM

## 2022-05-05 DIAGNOSIS — R262 Difficulty in walking, not elsewhere classified: Secondary | ICD-10-CM

## 2022-05-05 NOTE — Therapy (Signed)
OUTPATIENT PHYSICAL THERAPY TREATMENT NOTE    Patient Name: Mary [name] MRN: [id number] DOB:1941-02-18, 81 y.o., female Today's Date: 05/05/2022  PCP: Valerie Roys, DO REFERRING PROVIDER: Minna Merritts, MD   PT End of Session - 05/05/22 1315     Visit Number 13    Number of Visits 86    Date for PT Re-Evaluation 06/07/22    Authorization Type Medicare Primary, [employer] Secondary    Authorization Time Period 8/52/77-82/42/35 cert through 08/03/12    Progress Note Due on Visit 77    PT Start Time 1303    PT Stop Time 1344    PT Time Calculation (min) 41 min    Equipment Utilized During Treatment Gait belt    Activity Tolerance Patient tolerated treatment well;No increased pain    Behavior During Therapy WFL for tasks assessed/performed                  Past Medical History:  Diagnosis Date   Arrhythmia    Back pain    Basal cell carcinoma    L lower leg txted in [place] ~ 2016   Depression with anxiety    Hip pain    Hyperlipidemia    Hypertension    Neuromuscular disorder ([place])    Paroxysmal tachycardia ([place])    Past Surgical History:  Procedure Laterality Date   ABDOMINAL HYSTERECTOMY     BREAST BIOPSY     BREAST EXCISIONAL BIOPSY Left 2002   benign   CARDIAC CATHETERIZATION     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   x2   TOTAL HIP ARTHROPLASTY Bilateral    done twice   Patient Active Problem List   Diagnosis Date Noted   Insomnia 12/16/2021   Depression, recurrent ([place]) 43/15/4008   Diastolic heart failure ([place]) 07/30/2021   Hyperlipidemia 07/30/2021   Lymphedema 07/30/2021   Gait instability 06/02/2021   Primary hypertension 06/02/2021    REFERRING DIAG: R26.81 (ICD-10-CM) - Gait instability  THERAPY DIAG:  Difficulty in walking, not elsewhere classified  Other abnormalities of gait and mobility  Unsteadiness on feet  Abnormality of gait and mobility  Rationale for Evaluation and Treatment Rehabilitation  PERTINENT HISTORY: Pt is a  pleasant 81 y/o female referred to PT for gait instability. Pt ambulates with RW. She reports onset of gait instability started several years ago. 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 Pt has had PT in past following surgeries. Pt reports hx of frequent falls. She has had 2 falls in past 6 months. Pt believes falls are due to difficulty picking up L foot and dragging L foot. Pt reports no injury with falls.  Pt being seen by OT for lymphedema, reports LLE more affected than RLE. Pt currently lives alone. Her spouse passed away recently in 24-Jun-2022. Her adult son and DIL live nearby and can assist her. Pt denies any dizziness or lightheadedness. Pt reports no pain currently. 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   PRECAUTIONS: N/A  SUBJECTIVE:   Pt reports she is ready to behind therapy and attempt machines in the [facility] for progression following therapy POC.  Otherwise pt notes she is doing well.   PAIN:  Are you having pain? No   TODAY'S TREATMENT 05/05/22  Wellzone:   Patient required frequent verbal cues for proper positioning and proper set  up of the 3 below exercises  Pt instructed in squats using treadmill for support and with TRX squats, completes 2 x 10 of each with fading frequency cues to improve pt independence. Pt instructed in adjustment of TRX straps   Hoist leg extension, 3 plates, 3 x10, some knee discomfort on last set, reports not too bad and it is tolerable.   Hoist hamstring curl, 4 plates, 3x10  Curb training with 4 step trianiers in a row in wellzone with 2 step trainers  with 1 block/riser under them for increased challenge. , completes 4 times through. No LOB noted but still not confident with higher curbs with cane.   Increased height of step trainer to approx 6 inches and worked on LE placement on step.  Pt unable to safely put L LE on step with cane in right upper extremity but when switched cane to left upper extremity patient able to bring left upper extremity onto curb/step trainer.  Completed this 10 rounds and completed 1 round of stepping onto it and then stepping off onto another step trainer for a 4 inch compared to section step down in a 6 inch step up.  Rationale for Evaluation and Treatment Rehabilitation    PATIENT EDUCATION: Education details: Patient educated extensively regarding proper utilization of cane as well as demonstration of why proper use of cane will improve her ability to ambulate independently Person educated: Patient Education method: Explanation, Demonstration, and Verbal cues Education comprehension: verbalized understanding, returned demonstration, verbal cues required, and needs further education   HOME EXERCISE PROGRAM:  no updates as of 12/17/21 08/04/2021: Access Code: [email or web address] 09/28/2021 Access Code: [email or web address] 10/28/2021 Access Code: [email or web address]   01/11/2022 Access Code: [email or web address]   PT Short Term Goals      PT SHORT TERM GOAL #1   Title Pt will be independent with HEP in order to improve strength and balance in order to decrease fall risk and improve function at home and work.    Baseline 3/7: provided pt with initial HEP 4/5; compliant    Time 6    Period Weeks    Status Achieved    Target Date 09/15/21              PT Long Term Goals       PT LONG TERM GOAL #1   Title Patient will increase FOTO score to equal to or greater than 62 to demonstrate statistically significant improvement in mobility and quality of life.    Baseline 3/7: 50 4/5: 55%; 10/28/2021=53%; 8/23: 58   Time 12    Period Weeks    Status Revise    Target Date 03/17/22      PT LONG TERM GOAL #2   Title Pt will improve BERG to >49 in order to demonstrate clinically significant improvement in balance.    Baseline 3/7: 40/56 4/5: 44/56; 01/20/22: 47 02/16/22: ongoing    Time 12     Period Weeks    Status Revise    Target Date 03/17/22      PT LONG TERM GOAL #3   Title Pt will decrease 5TSTS by at least 3 seconds in order to demonstrate clinically significant improvement in LE strength.    Baseline 3/7: 15.36 sec with use of BUEs to assist 4/5: 9.6 seconds SUE support    Time 12    Period Weeks    Status Achieved    Target Date 03/17/22      PT LONG TERM GOAL #4  Title Pt will improve ABC by at least 13% in order to demonstrate clinically significant improvement in balance confidence.    Baseline 3/7: 50.63% 4/5: 62% 5/31= 63%; 7/20: 70.31%   Time 12    Period Weeks    Status Achieved   Target Date 03/17/22      PT LONG TERM GOAL #5   Title Patient will increase 10 meter walk test to >1.56ms as to improve gait speed for better community ambulation and to reduce fall risk.    Baseline 3/7: 0.75 m/s with 4WW 4/5: 0.96 m/s with 4 WW; 10/28/2021= 0.97 m/s with 49FY 7/20: 0.96 m/s with 4WW   Time 12    Period Weeks    Status Partially Met    Target Date 03/17/22        Additional Long Term Goals   Additional Long Term Goals Yes      PT LONG TERM GOAL #6   Title Patient will demonstrate improved balance by exhibiting ability to bowler stance (wide tandem) bilat without UE support for >60sec to demonstrate improved frontal plane postural control.    Baseline 01/20/22: 30sec bilat, partail hand support to perform on Rt side only    Time 12    Period Weeks    Status Revised    Target Date 03/17/22        PT LONG TERM GOAL #7   Title Patient will present with improved right knee AROM <10 deg knee ext from zero for decreased limp and leg length discrepancy to aide in walking and weight shifting.    Baseline 10/28/2021= 15 deg from zero (right knee ext) ; 7/20: 3 deg from zero    Time 12    Period Weeks    Status Achieved    Target Date 03/17/22       PT LONG TERM GOAL #8   Title Patient will demo modified Ind floor to sit transfer using chair for  improved ability to get up off the floor for decreased caregiver or EMS Support.    Baseline 10/28/2021= Not formally assessed yet patient reports difficulty and desire to practice to be able to perform on her own.; 7/20: CGA and min VC/demo to complete floor to stand transfer, use of chair for UE support 9/19: able to complete floor to and from  stand transfer with min cueing   Time 12    Period Weeks    Status Ongoing     Target Date 03/17/22             Plan     Clinical Impression Statement Patient presents with excellent motivation for completion of physical therapy activities.  Patient continues to be educated regarding proper use of well zone equipment as patient is still uncomfortable with completing many of these independently. Patient requires moderate verbal cues for proper adjustment and sequencing for getting on and off equipment as well as setting up equipment for optimal muscular strength training but showed much improvement this date compared to previous date. Pt continued with alternatives for leg press including air squat with UE support and TRX squat. Pt progresses with curb training with cane as AD today with increased height of simulated curb. Pt will continue to benefit from skilled physical therapy intervention to address impairments, improve QOL, and attain therapy goals.       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, B THA x2, diastolic heart  failure, back pain, tachycardia    Examination-Activity Limitations Bend;Reach Overhead;Stairs;Transfers;Lift;Squat;Locomotion Level;Carry;Stand;Dressing    Examination-Participation Restrictions Volunteer;Community Activity;Shop;Cleaning;Laundry;Yard Work    Merchant navy officer Evolving/Moderate complexity    Rehab Potential Good    PT Frequency 2x / week    PT Duration 12 weeks     PT Treatment/Interventions ADLs/Self Care Home Management;Aquatic Therapy;Biofeedback;Canalith Repostioning;Cryotherapy;Electrical Stimulation;Moist Heat;Traction;Ultrasound;DME Instruction;Gait training;Functional Biochemist, clinical;Therapeutic activities;Therapeutic exercise;Balance training;Neuromuscular re-education;Patient/family education;Orthotic Fit/Training;Wheelchair mobility training;Manual techniques;Passive range of motion;Scar mobilization;Dry needling;Energy conservation;Splinting;Taping;Vestibular;Visual/perceptual remediation/compensation;Joint Manipulations    PT Next Visit Plan Re-certification for next visit. Heavy front plane emphasis on hip strategy and ABDCT strength in functional context, cane practice and functional stability training        Consulted and Agree with Plan of Care Patient            Particia Lather PT  05/05/22, 2:45 PM

## 2022-05-10 ENCOUNTER — Encounter: Payer: Self-pay | Admitting: Physical Therapy

## 2022-05-10 ENCOUNTER — Ambulatory Visit: Payer: Medicare Other | Admitting: Physical Therapy

## 2022-05-10 DIAGNOSIS — R262 Difficulty in walking, not elsewhere classified: Secondary | ICD-10-CM

## 2022-05-10 DIAGNOSIS — R269 Unspecified abnormalities of gait and mobility: Secondary | ICD-10-CM

## 2022-05-10 DIAGNOSIS — R2689 Other abnormalities of gait and mobility: Secondary | ICD-10-CM

## 2022-05-10 DIAGNOSIS — R2681 Unsteadiness on feet: Secondary | ICD-10-CM

## 2022-05-10 NOTE — Therapy (Signed)
OUTPATIENT PHYSICAL THERAPY TREATMENT NOTE    Patient Name: Mary [name] MRN: [id number] DOB:11/12/1940, 81 y.o., female Today's Date: 05/10/2022  PCP: Valerie Roys, DO REFERRING PROVIDER: Minna Merritts, MD   PT End of Session - 05/10/22 1312     Visit Number 59    Number of Visits 16    Date for PT Re-Evaluation 06/07/22    Authorization Type Medicare Primary, A[employer] Secondary    Authorization Time Period 3/89/37-34/28/76 cert through 12/29/13    Progress Note Due on Visit 65    PT Start Time 1309    PT Stop Time 1344    PT Time Calculation (min) 35 min    Equipment Utilized During Treatment Gait belt    Activity Tolerance Patient tolerated treatment well;No increased pain    Behavior During Therapy WFL for tasks assessed/performed                  Past Medical History:  Diagnosis Date   Arrhythmia    Back pain    Basal cell carcinoma    L lower leg txted in [place] ~ 2016   Depression with anxiety    Hip pain    Hyperlipidemia    Hypertension    Neuromuscular disorder ([place])    Paroxysmal tachycardia ([place])    Past Surgical History:  Procedure Laterality Date   ABDOMINAL HYSTERECTOMY     BREAST BIOPSY     BREAST EXCISIONAL BIOPSY Left 2002   benign   CARDIAC CATHETERIZATION     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   x2   TOTAL HIP ARTHROPLASTY Bilateral    done twice   Patient Active Problem List   Diagnosis Date Noted   Insomnia 12/16/2021   Depression, recurrent ([place]) 72/62/0355   Diastolic heart failure ([place]) 07/30/2021   Hyperlipidemia 07/30/2021   Lymphedema 07/30/2021   Gait instability 06/02/2021   Primary hypertension 06/02/2021    REFERRING DIAG: R26.81 (ICD-10-CM) - Gait instability  THERAPY DIAG:  Difficulty in walking, not elsewhere classified  Other abnormalities of gait and mobility  Unsteadiness on feet  Abnormality of gait and mobility  Rationale for Evaluation and Treatment Rehabilitation  PERTINENT HISTORY: Pt is a  pleasant 81 y/o female referred to PT for gait instability. Pt ambulates with RW. She reports onset of gait instability started several years ago. 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 Pt has had PT in past following surgeries. Pt reports hx of frequent falls. She has had 2 falls in past 6 months. Pt believes falls are due to difficulty picking up L foot and dragging L foot. Pt reports no injury with falls.  Pt being seen by OT for lymphedema, reports LLE more affected than RLE. Pt currently lives alone. Her spouse passed away recently in 06/21/22. Her adult son and DIL live nearby and can assist her. Pt denies any dizziness or lightheadedness. Pt reports no pain currently. 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   PRECAUTIONS: N/A  SUBJECTIVE:   Patient reports she apologizes for being late and reports no falls or loss of balance since last session and overall she has been doing well.   PAIN:  Are you having pain? No   TODAY'S TREATMENT 05/10/22  Wellzone:  TE  Pt instructed in squats using treadmill for support and with TRX squats,  completes 2 x 10 of each with fading frequency cues to improve pt independence. Pt instructed in adjustment of TRX straps   Hoist leg extension, 3 plates, 3 x10, pt able to independently set all weights and machine adjustments this date, pointers on ease of LE positioning.   Hoist hamstring curl, 4 plates, 3x10  Patient requested further instruction on NuStep.  Patient is able to don and doff NuStep independently but requires cues for adjusting height and resistance for proper challenge.  Completed 5 minutes at level 5 on the NuStep  TA Curb training on curb that is about 7 to 8 inches in height (step trainer with 2 risers underneath) completes 10 toe taps with right and left lower extremities putting  cane and corresponding hand to wide base of support to ease task.  Then completes 2 step ups onto the surface and step downs onto another step trainer.  Rationale for Evaluation and Treatment Rehabilitation    PATIENT EDUCATION: Education details: Patient educated extensively regarding proper utilization of cane as well as demonstration of why proper use of cane will improve her ability to ambulate independently Person educated: Patient Education method: Explanation, Demonstration, and Verbal cues Education comprehension: verbalized understanding, returned demonstration, verbal cues required, and needs further education   HOME EXERCISE PROGRAM:  no updates as of 12/17/21 08/04/2021: Access Code: [email or web address] 09/28/2021 Access Code: [email or web address] 10/28/2021 Access Code: [email or web address]   01/11/2022 Access Code: [email or web address]   PT Short Term Goals      PT SHORT TERM GOAL #1   Title Pt will be independent with HEP in order to improve strength and balance in order to decrease fall risk and improve function at home and work.    Baseline 3/7: provided pt with initial HEP 4/5; compliant    Time 6    Period Weeks    Status Achieved    Target Date 09/15/21              PT Long Term Goals       PT LONG TERM GOAL #1   Title Patient will increase FOTO score to equal to or greater than 62 to demonstrate statistically significant improvement in mobility and quality of life.    Baseline 3/7: 50 4/5: 55%; 10/28/2021=53%; 8/23: 58   Time 12    Period Weeks    Status Revise    Target Date 03/17/22      PT LONG TERM GOAL #2   Title Pt will improve BERG to >49 in order to demonstrate clinically significant improvement in balance.    Baseline 3/7: 40/56 4/5: 44/56; 01/20/22: 47 02/16/22: ongoing    Time 12    Period Weeks    Status Revise    Target Date 03/17/22      PT LONG TERM GOAL #3   Title Pt will decrease 5TSTS by at least 3 seconds in order to demonstrate clinically significant improvement in LE  strength.    Baseline 3/7: 15.36 sec with use of BUEs to assist 4/5: 9.6 seconds SUE support    Time 12    Period Weeks    Status Achieved    Target Date 03/17/22      PT LONG TERM GOAL #4   Title Pt will improve ABC by at least 13% in order to demonstrate clinically significant improvement in balance confidence.    Baseline 3/7: 50.63% 4/5: 62% 5/31= 63%; 7/20: 70.31%   Time 12    Period Weeks    Status Achieved  Target Date 03/17/22      PT LONG TERM GOAL #5   Title Patient will increase 10 meter walk test to >1.61ms as to improve gait speed for better community ambulation and to reduce fall risk.    Baseline 3/7: 0.75 m/s with 4WW 4/5: 0.96 m/s with 4 WW; 10/28/2021= 0.97 m/s with 44LP 7/20: 0.96 m/s with 4WW   Time 12    Period Weeks    Status Partially Met    Target Date 03/17/22        Additional Long Term Goals   Additional Long Term Goals Yes      PT LONG TERM GOAL #6   Title Patient will demonstrate improved balance by exhibiting ability to bowler stance (wide tandem) bilat without UE support for >60sec to demonstrate improved frontal plane postural control.    Baseline 01/20/22: 30sec bilat, partail hand support to perform on Rt side only    Time 12    Period Weeks    Status Revised    Target Date 03/17/22        PT LONG TERM GOAL #7   Title Patient will present with improved right knee AROM <10 deg knee ext from zero for decreased limp and leg length discrepancy to aide in walking and weight shifting.    Baseline 10/28/2021= 15 deg from zero (right knee ext) ; 7/20: 3 deg from zero    Time 12    Period Weeks    Status Achieved    Target Date 03/17/22       PT LONG TERM GOAL #8   Title Patient will demo modified Ind floor to sit transfer using chair for improved ability to get up off the floor for decreased caregiver or EMS Support.    Baseline 10/28/2021= Not formally assessed yet patient reports difficulty and desire to practice to be able to perform on her  own.; 7/20: CGA and min VC/demo to complete floor to stand transfer, use of chair for UE support 9/19: able to complete floor to and from  stand transfer with min cueing   Time 12    Period Weeks    Status Ongoing     Target Date 03/17/22             Plan     Clinical Impression Statement Patient presents with excellent motivation for completion of physical therapy activities.  Patient continues to be educated regarding proper use of well zone equipment is gradually progressing with her level of independence with all of these activities still requires minimal cues for proper and safe performance.  Patient requires min verbal cues for proper adjustment and sequencing for getting on and off equipment as well as setting up equipment for optimal muscular strength training but showed much improvement this date compared to previous date..Marland KitchenPt progresses with curb training with cane as AD today with increased height of simulated curb. Pt will continue to benefit from skilled physical therapy intervention to address impairments, improve QOL, and attain therapy goals.       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, B THA x2, diastolic heart failure, back pain, tachycardia    Examination-Activity Limitations Bend;Reach Overhead;Stairs;Transfers;Lift;Squat;Locomotion Level;Carry;Stand;Dressing    Examination-Participation Restrictions Volunteer;Community Activity;Shop;Cleaning;Laundry;Yard Work    Stability/Clinical Decision Making Evolving/Moderate complexity    Rehab Potential Good    PT Frequency 2x / week    PT Duration 12 weeks  PT Treatment/Interventions ADLs/Self Care Home Management;Aquatic Therapy;Biofeedback;Canalith Repostioning;Cryotherapy;Electrical Stimulation;Moist Heat;Traction;Ultrasound;DME Instruction;Gait training;Functional Research scientist (physical sciences);Therapeutic activities;Therapeutic exercise;Balance training;Neuromuscular re-education;Patient/family education;Orthotic Fit/Training;Wheelchair mobility training;Manual techniques;Passive range of motion;Scar mobilization;Dry needling;Energy conservation;Splinting;Taping;Vestibular;Visual/perceptual remediation/compensation;Joint Manipulations    PT Next Visit Plan Re-certification for next visit. Heavy front plane emphasis on hip strategy and ABDCT strength in functional context, cane practice and functional stability training        Consulted and Agree with Plan of Care Patient            Particia Lather PT  05/10/22, 3:05 PM

## 2022-05-11 ENCOUNTER — Ambulatory Visit: Payer: Medicare Other | Admitting: Physical Therapy

## 2022-05-12 ENCOUNTER — Ambulatory Visit: Payer: Medicare Other | Admitting: Physical Therapy

## 2022-05-12 DIAGNOSIS — M6281 Muscle weakness (generalized): Secondary | ICD-10-CM

## 2022-05-12 DIAGNOSIS — R262 Difficulty in walking, not elsewhere classified: Secondary | ICD-10-CM

## 2022-05-12 DIAGNOSIS — R2689 Other abnormalities of gait and mobility: Secondary | ICD-10-CM

## 2022-05-12 DIAGNOSIS — R2681 Unsteadiness on feet: Secondary | ICD-10-CM

## 2022-05-12 DIAGNOSIS — R269 Unspecified abnormalities of gait and mobility: Secondary | ICD-10-CM

## 2022-05-12 NOTE — Therapy (Signed)
OUTPATIENT PHYSICAL THERAPY TREATMENT NOTE/ Physical Therapy Progress Note   Dates of reporting period  03/29/22   to   05/12/22     Patient Name: Mary [name] MRN: [id number] DOB:[birth year], 81 y.o., female Today's Date: 05/12/2022  PCP: Valerie Roys, DO REFERRING PROVIDER: Minna Merritts, MD   PT End of Session - 05/12/22 1313     Visit Number 60    Number of Visits 63    Date for PT Re-Evaluation 06/07/22    Authorization Type Medicare Primary, [employer] Secondary    Authorization Time Period 10/15/98-17/49/44 cert through 02/03/74    Progress Note Due on Visit 19    PT Start Time 1303    PT Stop Time 1342    PT Time Calculation (min) 39 min    Equipment Utilized During Treatment Gait belt    Activity Tolerance Patient tolerated treatment well;No increased pain    Behavior During Therapy WFL for tasks assessed/performed                  Past Medical History:  Diagnosis Date   Arrhythmia    Back pain    Basal cell carcinoma    L lower leg txted in [place] ~ 2016   Depression with anxiety    Hip pain    Hyperlipidemia    Hypertension    Neuromuscular disorder ([place])    Paroxysmal tachycardia ([place])    Past Surgical History:  Procedure Laterality Date   ABDOMINAL HYSTERECTOMY     BREAST BIOPSY     BREAST EXCISIONAL BIOPSY Left 2002   benign   CARDIAC CATHETERIZATION     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   x2   TOTAL HIP ARTHROPLASTY Bilateral    done twice   Patient Active Problem List   Diagnosis Date Noted   Insomnia 12/16/2021   Depression, recurrent ([place]) 91/63/8466   Diastolic heart failure ([place]) 07/30/2021   Hyperlipidemia 07/30/2021   Lymphedema 07/30/2021   Gait instability 06/02/2021   Primary hypertension 06/02/2021    REFERRING DIAG: R26.81 (ICD-10-CM) - Gait instability  THERAPY DIAG:  No diagnosis found.  Rationale for Evaluation and Treatment Rehabilitation  PERTINENT HISTORY: Pt is a pleasant 81 y/o female referred to PT for gait  instability. Pt ambulates with RW. She reports onset of gait instability started several years ago. 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 Pt has had PT in past following surgeries. Pt reports hx of frequent falls. She has had 2 falls in past 6 months. Pt believes falls are due to difficulty picking up L foot and dragging L foot. Pt reports no injury with falls.  Pt being seen by OT for lymphedema, reports LLE more affected than RLE. Pt currently lives alone. Her spouse passed away recently in 07-10-2022. Her adult son and DIL live nearby and can assist her. Pt denies any dizziness or lightheadedness. Pt reports no pain currently. 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   PRECAUTIONS: N/A  SUBJECTIVE:   Patient reports she has been doing well and reports no falls or LOB since last session.    PAIN:  Are you having pain? No   TODAY'S TREATMENT 05/12/22 Physical therapy treatment session today consisted of completing assessment of goals and administration of testing as demonstrated and documented in flow sheet, treatment, and goals section  of this note. Addition treatments may be found below.   [facility] PT Assessment - 05/12/22 0001       Berg Balance Test   Sit to Stand Able to stand without using hands and stabilize independently    Standing Unsupported Able to stand safely 2 minutes    Sitting with Back Unsupported but Feet Supported on Floor or Stool Able to sit safely and securely 2 minutes    Stand to Sit Sits safely with minimal use of hands    Transfers Able to transfer safely, minor use of hands    Standing Unsupported with Eyes Closed Able to stand 10 seconds safely    Standing Unsupported with Feet Together Able to place feet together independently and stand 1 minute safely    From Standing, Reach Forward with Outstretched Arm Can  reach confidently >25 cm (10")    From Standing Position, Pick up Object from Floor Able to pick up shoe, needs supervision    From Standing Position, Turn to Look Behind Over each Shoulder Looks behind from both sides and weight shifts well    Turn 360 Degrees Able to turn 360 degrees safely but slowly    Standing Unsupported, Alternately Place Feet on Step/Stool Able to stand independently and complete 8 steps >20 seconds    Standing Unsupported, One Foot in Front Able to plae foot ahead of the other independently and hold 30 seconds    Standing on One Leg Tries to lift leg/unable to hold 3 seconds but remains standing independently    Total Score 48              [place]:  TE  Nustep level 4 x 5 min   Pt instructed in squats using treadmill for support and with TRX squats, completes 2 x 10 of each with fading frequency cues to improve pt independence. Pt instructed in adjustment of TRX straps   Hoist leg extension, 3 plates, 3 x10, pt able to independently set all weights and machine adjustments this date, pointers on ease of LE positioning.   Hoist hamstring curl, 4 plates, 3x10  Step up to 4 inch step with UE support on chair. 2 x 10 reps ea LE   Rationale for Evaluation and Treatment Rehabilitation    PATIENT EDUCATION: Education details: Patient educated extensively regarding proper utilization of cane as well as demonstration of why proper use of cane will improve her ability to ambulate independently Person educated: Patient Education method: Explanation, Demonstration, and Verbal cues Education comprehension: verbalized understanding, returned demonstration, verbal cues required, and needs further education   HOME EXERCISE PROGRAM:   Access Code: [email or web address] URL: [email or web address] Date: 05/12/2022 Prepared by: Rivka Barbara  Exercises - Nustep level 4-5 x 15 minutes   - 1 x daily - 3 x weekly - Mini Squat with Counter Support  - 1 x daily -  3 x weekly - 2 sets - 10 reps - Squat with TRX  - 1 x daily - 3 x weekly - 2 sets - 10 reps - Knee Extension with Weight Machine  - 1 x daily - 3 x weekly - 3 sets - 10 reps - Hamstring Curl with Weight Machine  - 1 x daily - 3 x weekly - 3 sets - 10 reps - Forward Step Up  - 1 x daily - 7 x weekly - 2 sets - 10 reps no updates as of 12/17/21    PT Short Term Goals      PT  SHORT TERM GOAL #1   Title Pt will be independent with HEP in order to improve strength and balance in order to decrease fall risk and improve function at home and work.    Baseline 3/7: provided pt with initial HEP 4/5; compliant    Time 6    Period Weeks    Status Achieved    Target Date 09/15/21              PT Long Term Goals       PT LONG TERM GOAL #1   Title Patient will increase FOTO score to equal to or greater than 62 to demonstrate statistically significant improvement in mobility and quality of life.    Baseline 3/7: 50 4/5: 55%; 10/28/2021=53%; 8/23: 58   Time 12    Period Weeks    Status Revise    Target Date 03/17/22      PT LONG TERM GOAL #2   Title Pt will improve BERG to >49 in order to demonstrate clinically significant improvement in balance.    Baseline 3/7: 40/56 4/5: 44/56; 01/20/22: 47 02/16/22: ongoing    Time 12    Period Weeks    Status Revise    Target Date 03/17/22      PT LONG TERM GOAL #3   Title Pt will decrease 5TSTS by at least 3 seconds in order to demonstrate clinically significant improvement in LE strength.    Baseline 3/7: 15.36 sec with use of BUEs to assist 4/5: 9.6 seconds SUE support    Time 12    Period Weeks    Status Achieved    Target Date 03/17/22      PT LONG TERM GOAL #4   Title Pt will improve ABC by at least 13% in order to demonstrate clinically significant improvement in balance confidence.    Baseline 3/7: 50.63% 4/5: 62% 5/31= 63%; 7/20: 70.31%   Time 12    Period Weeks    Status Achieved   Target Date 03/17/22      PT LONG TERM GOAL #5    Title Patient will increase 10 meter walk test to >1.52ms as to improve gait speed for better community ambulation and to reduce fall risk.    Baseline 3/7: 0.75 m/s with 4WW 4/5: 0.96 m/s with 4 WW; 10/28/2021= 0.97 m/s with 43FT 7/20: 0.96 m/s with 4WW   Time 12    Period Weeks    Status Partially Met    Target Date 03/17/22        Additional Long Term Goals   Additional Long Term Goals Yes      PT LONG TERM GOAL #6   Title Patient will demonstrate improved balance by exhibiting ability to bowler stance (wide tandem) bilat without UE support for >60sec to demonstrate improved frontal plane postural control.    Baseline 01/20/22: 30sec bilat, partail hand support to perform on Rt side only    Time 12    Period Weeks    Status Revised    Target Date 03/17/22        PT LONG TERM GOAL #7   Title Patient will present with improved right knee AROM <10 deg knee ext from zero for decreased limp and leg length discrepancy to aide in walking and weight shifting.    Baseline 10/28/2021= 15 deg from zero (right knee ext) ; 7/20: 3 deg from zero    Time 12    Period Weeks    Status Achieved  Target Date 03/17/22       PT LONG TERM GOAL #8   Title Patient will demo modified Ind floor to sit transfer using chair for improved ability to get up off the floor for decreased caregiver or EMS Support.    Baseline 10/28/2021= Not formally assessed yet patient reports difficulty and desire to practice to be able to perform on her own.; 7/20: CGA and min VC/demo to complete floor to stand transfer, use of chair for UE support 9/19: able to complete floor to and from  stand transfer with min cueing   Time 12    Period Weeks    Status Ongoing     Target Date 03/17/22             Plan     Clinical Impression Statement Patient presents to physical therapy for progress note and treatment this date.  Patient continues to show excellent progress with her independence with gym related activities and  only requires minimal cues for proper set up and utilization of various gym equipment and completion of various gym exercises.  Patient also continues to show progress with her ability to safely ambulate up and down curbs with her straight point cane indicating improved independence with community ambulation.  Patient's Berg balance score remains the same and patient's focus on therapeutic outcome score also remains the same at this time.  Patient comfortable with nearing discharge and physical therapy as she becomes more independent with her progressive home and gym-based exercise routines.Pt will continue to benefit from skilled physical therapy intervention to address impairments, improve QOL, and attain therapy goals. Patient's condition has the potential to improve in response to therapy. Maximum improvement is yet to be obtained. The anticipated improvement is attainable and reasonable in a generally predictable time.        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, B THA x2, diastolic heart failure, back pain, tachycardia    Examination-Activity Limitations Bend;Reach Overhead;Stairs;Transfers;Lift;Squat;Locomotion Level;Carry;Stand;Dressing    Examination-Participation Restrictions Volunteer;Community Activity;Shop;Cleaning;Laundry;Yard Work    Merchant navy officer Evolving/Moderate complexity    Rehab Potential Good    PT Frequency 2x / week    PT Duration 12 weeks    PT Treatment/Interventions ADLs/Self Care Home Management;Aquatic Therapy;Biofeedback;Canalith Repostioning;Cryotherapy;Electrical Stimulation;Moist Heat;Traction;Ultrasound;DME Instruction;Gait training;Functional Biochemist, clinical;Therapeutic activities;Therapeutic exercise;Balance training;Neuromuscular re-education;Patient/family education;Orthotic  Fit/Training;Wheelchair mobility training;Manual techniques;Passive range of motion;Scar mobilization;Dry needling;Energy conservation;Splinting;Taping;Vestibular;Visual/perceptual remediation/compensation;Joint Manipulations    PT Next Visit Plan Re-certification for next visit. Heavy front plane emphasis on hip strategy and ABDCT strength in functional context, cane practice and functional stability training        Consulted and Agree with Plan of Care Patient            Particia Lather PT  05/12/22, 2:47 PM

## 2022-05-17 ENCOUNTER — Ambulatory Visit: Payer: Medicare Other | Admitting: Physical Therapy

## 2022-05-17 DIAGNOSIS — M6281 Muscle weakness (generalized): Secondary | ICD-10-CM

## 2022-05-17 DIAGNOSIS — R2681 Unsteadiness on feet: Secondary | ICD-10-CM

## 2022-05-17 DIAGNOSIS — R262 Difficulty in walking, not elsewhere classified: Secondary | ICD-10-CM

## 2022-05-17 DIAGNOSIS — R269 Unspecified abnormalities of gait and mobility: Secondary | ICD-10-CM

## 2022-05-17 NOTE — Therapy (Signed)
OUTPATIENT PHYSICAL THERAPY TREATMENT NOTE     Patient Name: Mary [name] MRN: [id number] DOB:Apr 22, 1941, 81 y.o., female Today's Date: 05/17/2022  PCP: Valerie Roys, DO REFERRING PROVIDER: Minna Merritts, MD   PT End of Session - 05/17/22 1451     Visit Number 61    Number of Visits 68    Date for PT Re-Evaluation 06/07/22    Authorization Type Medicare Primary, A[employer] Secondary    Authorization Time Period 7/40/81-44/81/85 cert through 10/31/12    Progress Note Due on Visit 67    PT Start Time 1302    PT Stop Time 1342    PT Time Calculation (min) 40 min    Equipment Utilized During Treatment Gait belt    Activity Tolerance Patient tolerated treatment well;No increased pain    Behavior During Therapy WFL for tasks assessed/performed                   Past Medical History:  Diagnosis Date   Arrhythmia    Back pain    Basal cell carcinoma    L lower leg txted in [place] ~ 2016   Depression with anxiety    Hip pain    Hyperlipidemia    Hypertension    Neuromuscular disorder ([place])    Paroxysmal tachycardia ([place])    Past Surgical History:  Procedure Laterality Date   ABDOMINAL HYSTERECTOMY     BREAST BIOPSY     BREAST EXCISIONAL BIOPSY Left 2002   benign   CARDIAC CATHETERIZATION     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   x2   TOTAL HIP ARTHROPLASTY Bilateral    done twice   Patient Active Problem List   Diagnosis Date Noted   Insomnia 12/16/2021   Depression, recurrent ([place]) 97/07/6376   Diastolic heart failure ([place]) 07/30/2021   Hyperlipidemia 07/30/2021   Lymphedema 07/30/2021   Gait instability 06/02/2021   Primary hypertension 06/02/2021    REFERRING DIAG: R26.81 (ICD-10-CM) - Gait instability  THERAPY DIAG:  Difficulty in walking, not elsewhere classified  Unsteadiness on feet  Abnormality of gait and mobility  Muscle weakness (generalized)  Rationale for Evaluation and Treatment Rehabilitation  PERTINENT HISTORY: Pt is a pleasant 81  y/o female referred to PT for gait instability. Pt ambulates with RW. She reports onset of gait instability started several years ago. 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 Pt has had PT in past following surgeries. Pt reports hx of frequent falls. She has had 2 falls in past 6 months. Pt believes falls are due to difficulty picking up L foot and dragging L foot. Pt reports no injury with falls.  Pt being seen by OT for lymphedema, reports LLE more affected than RLE. Pt currently lives alone. Her spouse passed away recently in 06/29/2022. Her adult son and DIL live nearby and can assist her. Pt denies any dizziness or lightheadedness. Pt reports no pain currently. 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   PRECAUTIONS: N/A  SUBJECTIVE:   Patient reports she has been doing well and reports no falls or LOB since last session.    PAIN:  Are you having pain? No   TODAY'S TREATMENT 05/17/22  Pt requests to practice floor to stand transfers. Pt provided verbal instruction then was abel to complete transfer with increased time and with plinth table  independently with SBA. Pt notes improvement with confidence with this task.   Curb training obstacle course with 6  and 5 inch curbs. Pt has to switch hands with cane for step up portion for L LE placement then switch back for R LE placement and remainder of curb. Completed 2 x 8 repetitions.   STS x 10 with no UE assist - Pt instructed to complete this exercise every day   Rationale for Evaluation and Treatment Rehabilitation    PATIENT EDUCATION: Education details: Patient educated extensively regarding proper utilization of cane as well as demonstration of why proper use of cane will improve her ability to ambulate independently Person educated: Patient Education method: Explanation,  Demonstration, and Verbal cues Education comprehension: verbalized understanding, returned demonstration, verbal cues required, and needs further education   HOME EXERCISE PROGRAM:   Access Code: [email or web address] URL: [email or web address] Date: 05/12/2022 Prepared by: Rivka Barbara  Exercises - Nustep level 4-5 x 15 minutes   - 1 x daily - 3 x weekly - Mini Squat with Counter Support  - 1 x daily - 3 x weekly - 2 sets - 10 reps - Squat with TRX  - 1 x daily - 3 x weekly - 2 sets - 10 reps - Knee Extension with Weight Machine  - 1 x daily - 3 x weekly - 3 sets - 10 reps - Hamstring Curl with Weight Machine  - 1 x daily - 3 x weekly - 3 sets - 10 reps - Forward Step Up  - 1 x daily - 7 x weekly - 2 sets - 10 reps no updates as of 12/17/21    PT Short Term Goals      PT SHORT TERM GOAL #1   Title Pt will be independent with HEP in order to improve strength and balance in order to decrease fall risk and improve function at home and work.    Baseline 3/7: provided pt with initial HEP 4/5; compliant    Time 6    Period Weeks    Status Achieved    Target Date 09/15/21              PT Long Term Goals       PT LONG TERM GOAL #1   Title Patient will increase FOTO score to equal to or greater than 62 to demonstrate statistically significant improvement in mobility and quality of life.    Baseline 3/7: 50 4/5: 55%; 10/28/2021=53%; 8/23: 58   Time 12    Period Weeks    Status Revise    Target Date 03/17/22      PT LONG TERM GOAL #2   Title Pt will improve BERG to >49 in order to demonstrate clinically significant improvement in balance.    Baseline 3/7: 40/56 4/5: 44/56; 01/20/22: 47 02/16/22: ongoing    Time 12    Period Weeks    Status Revise    Target Date 03/17/22      PT LONG TERM GOAL #3   Title Pt will decrease 5TSTS by at least 3 seconds in order to demonstrate clinically significant improvement in LE strength.    Baseline 3/7: 15.36 sec with use of BUEs  to assist 4/5: 9.6 seconds SUE support    Time 12    Period Weeks    Status Achieved    Target Date 03/17/22      PT LONG TERM GOAL #4   Title Pt will improve ABC by at least 13% in order  to demonstrate clinically significant improvement in balance confidence.    Baseline 3/7: 50.63% 4/5: 62% 5/31= 63%; 7/20: 70.31%   Time 12    Period Weeks    Status Achieved   Target Date 03/17/22      PT LONG TERM GOAL #5   Title Patient will increase 10 meter walk test to >1.57ms as to improve gait speed for better community ambulation and to reduce fall risk.    Baseline 3/7: 0.75 m/s with 4WW 4/5: 0.96 m/s with 4 WW; 10/28/2021= 0.97 m/s with 48PF 7/20: 0.96 m/s with 4WW   Time 12    Period Weeks    Status Partially Met    Target Date 03/17/22        Additional Long Term Goals   Additional Long Term Goals Yes      PT LONG TERM GOAL #6   Title Patient will demonstrate improved balance by exhibiting ability to bowler stance (wide tandem) bilat without UE support for >60sec to demonstrate improved frontal plane postural control.    Baseline 01/20/22: 30sec bilat, partail hand support to perform on Rt side only    Time 12    Period Weeks    Status Revised    Target Date 03/17/22        PT LONG TERM GOAL #7   Title Patient will present with improved right knee AROM <10 deg knee ext from zero for decreased limp and leg length discrepancy to aide in walking and weight shifting.    Baseline 10/28/2021= 15 deg from zero (right knee ext) ; 7/20: 3 deg from zero    Time 12    Period Weeks    Status Achieved    Target Date 03/17/22       PT LONG TERM GOAL #8   Title Patient will demo modified Ind floor to sit transfer using chair for improved ability to get up off the floor for decreased caregiver or EMS Support.    Baseline 10/28/2021= Not formally assessed yet patient reports difficulty and desire to practice to be able to perform on her own.; 7/20: CGA and min VC/demo to complete floor to stand  transfer, use of chair for UE support 9/19: able to complete floor to and from  stand transfer with min cueing   Time 12    Period Weeks    Status Ongoing     Target Date 03/17/22             Plan     Clinical Impression Statement Patient continues with functional lower extremity strengthening and techniques for safe transfers and community ambulation.  Patient able to perform floor to stand transfer with cues prior to transfer for sequencing but with no cues during transfer order to ensure patient is able to safely complete at home.  Patient able to complete but does have increased time.  Patient continues to progress with curb training and although has significant compensations when utilizing the cane she is able to complete safely without loss of balance consistently with normal height curbs.  Physical therapist and patient worked to formulate plan for discharge next session with HEP.  Patient reports she is comfortable with this plan.  Pt will continue to benefit from skilled physical therapy intervention to address impairments, improve QOL, and attain therapy goals.      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, B THA x2, diastolic heart failure,  back pain, tachycardia    Examination-Activity Limitations Bend;Reach Overhead;Stairs;Transfers;Lift;Squat;Locomotion Level;Carry;Stand;Dressing    Examination-Participation Restrictions Volunteer;Community Activity;Shop;Cleaning;Laundry;Yard Work    Merchant navy officer Evolving/Moderate complexity    Rehab Potential Good    PT Frequency 2x / week    PT Duration 12 weeks    PT Treatment/Interventions ADLs/Self Care Home Management;Aquatic Therapy;Biofeedback;Canalith Repostioning;Cryotherapy;Electrical Stimulation;Moist Heat;Traction;Ultrasound;DME Instruction;Gait training;Functional Scientist, research (life sciences);Therapeutic activities;Therapeutic exercise;Balance training;Neuromuscular re-education;Patient/family education;Orthotic Fit/Training;Wheelchair mobility training;Manual techniques;Passive range of motion;Scar mobilization;Dry needling;Energy conservation;Splinting;Taping;Vestibular;Visual/perceptual remediation/compensation;Joint Manipulations    PT Next Visit Plan D/C next session        Consulted and Agree with Plan of Care Patient            Particia Lather PT  05/17/22, 2:52 PM

## 2022-05-19 ENCOUNTER — Ambulatory Visit: Payer: Medicare Other | Admitting: Physical Therapy

## 2022-05-19 DIAGNOSIS — M6281 Muscle weakness (generalized): Secondary | ICD-10-CM

## 2022-05-19 DIAGNOSIS — R2689 Other abnormalities of gait and mobility: Secondary | ICD-10-CM

## 2022-05-19 DIAGNOSIS — R262 Difficulty in walking, not elsewhere classified: Secondary | ICD-10-CM | POA: Diagnosis not present

## 2022-05-19 DIAGNOSIS — R2681 Unsteadiness on feet: Secondary | ICD-10-CM

## 2022-05-19 DIAGNOSIS — R269 Unspecified abnormalities of gait and mobility: Secondary | ICD-10-CM

## 2022-05-19 NOTE — Therapy (Signed)
OUTPATIENT PHYSICAL THERAPY TREATMENT NOTE/ PT discharge      Patient Name: Mary [name] MRN: [id number] DOB:12/03/1940, 81 y.o., female Today's Date: 05/19/2022  PCP: Valerie Roys, DO REFERRING PROVIDER: Minna Merritts, MD   PT End of Session - 05/19/22 1319     Visit Number 35    Number of Visits 18    Date for PT Re-Evaluation 06/07/22    Authorization Type Medicare Primary, AARP Secondary    Authorization Time Period 0/25/42-70/62/37 cert through 10/30/81    Progress Note Due on Visit 3    PT Start Time 1304    PT Stop Time 1335    PT Time Calculation (min) 31 min    Equipment Utilized During Treatment Gait belt    Activity Tolerance Patient tolerated treatment well;No increased pain    Behavior During Therapy WFL for tasks assessed/performed                    Past Medical History:  Diagnosis Date   Arrhythmia    Back pain    Basal cell carcinoma    L lower leg txted in [place] ~ 2016   Depression with anxiety    Hip pain    Hyperlipidemia    Hypertension    Neuromuscular disorder ([place])    Paroxysmal tachycardia ([place])    Past Surgical History:  Procedure Laterality Date   ABDOMINAL HYSTERECTOMY     BREAST BIOPSY     BREAST EXCISIONAL BIOPSY Left 2002   benign   CARDIAC CATHETERIZATION     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   x2   TOTAL HIP ARTHROPLASTY Bilateral    done twice   Patient Active Problem List   Diagnosis Date Noted   Insomnia 12/16/2021   Depression, recurrent ([place]) 15/17/6160   Diastolic heart failure ([place]) 07/30/2021   Hyperlipidemia 07/30/2021   Lymphedema 07/30/2021   Gait instability 06/02/2021   Primary hypertension 06/02/2021    REFERRING DIAG: R26.81 (ICD-10-CM) - Gait instability  THERAPY DIAG:  Difficulty in walking, not elsewhere classified  Unsteadiness on feet  Abnormality of gait and mobility  Muscle weakness (generalized)  Other abnormalities of gait and mobility  Rationale for Evaluation and  Treatment Rehabilitation  PERTINENT HISTORY: Pt is a pleasant 81 y/o female referred to PT for gait instability. Pt ambulates with RW. She reports onset of gait instability started several years ago. 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 Pt has had PT in past following surgeries. Pt reports hx of frequent falls. She has had 2 falls in past 6 months. Pt believes falls are due to difficulty picking up L foot and dragging L foot. Pt reports no injury with falls.  Pt being seen by OT for lymphedema, reports LLE more affected than RLE. Pt currently lives alone. Her spouse passed away recently in 13-Jul-2022. Her adult son and DIL live nearby and can assist her. Pt denies any dizziness or lightheadedness. Pt reports no pain currently. 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   PRECAUTIONS: N/A  SUBJECTIVE:   Patient reports she has been doing well and reports no falls or LOB since last session. Pt happy with her current progress.    PAIN:  Are you having pain? No   TODAY'S TREATMENT 05/19/22  Pt requests to practice floor to stand transfers.  Pt provided verbal instruction then was abel to complete transfer with increased time and with plinth table independently with SBA. Pt notes improvement with confidence with this task.   Curb training obstacle course with 6  and 7 inch curbs. Pt has to switch hands with cane for step up portion for L LE placement then switch back for R LE placement and remainder of curb. Completed 2 x 8 repetitions. No Lob noted   Rationale for Evaluation and Treatment Rehabilitation    PATIENT EDUCATION: Education details: Patient educated extensively regarding proper utilization of cane as well as demonstration of why proper use of cane will improve her ability to ambulate independently Person educated: Patient Education  method: Explanation, Demonstration, and Verbal cues Education comprehension: verbalized understanding, returned demonstration, verbal cues required, and needs further education   HOME EXERCISE PROGRAM:   Access Code: [email or web address] URL: [email or web address] Date: 05/12/2022 Prepared by: Rivka Barbara  Exercises - Nustep level 4-5 x 15 minutes   - 1 x daily - 3 x weekly - Mini Squat with Counter Support  - 1 x daily - 3 x weekly - 2 sets - 10 reps - Squat with TRX  - 1 x daily - 3 x weekly - 2 sets - 10 reps - Knee Extension with Weight Machine  - 1 x daily - 3 x weekly - 3 sets - 10 reps - Hamstring Curl with Weight Machine  - 1 x daily - 3 x weekly - 3 sets - 10 reps - Forward Step Up  - 1 x daily - 7 x weekly - 2 sets - 10 reps no updates as of 12/17/21    PT Short Term Goals      PT SHORT TERM GOAL #1   Title Pt will be independent with HEP in order to improve strength and balance in order to decrease fall risk and improve function at home and work.    Baseline 3/7: provided pt with initial HEP 4/5; compliant    Time 6    Period Weeks    Status Achieved    Target Date 09/15/21              PT Long Term Goals       PT LONG TERM GOAL #1   Title Patient will increase FOTO score to equal to or greater than 62 to demonstrate statistically significant improvement in mobility and quality of life.    Baseline 3/7: 50 4/5: 55%; 10/28/2021=53%; 8/23: 58   Time 12    Period Weeks    Status Not Met    Target Date 03/17/22      PT LONG TERM GOAL #2   Title Pt will improve BERG to >49 in order to demonstrate clinically significant improvement in balance.    Baseline 3/7: 40/56 4/5: 44/56; 01/20/22: 47 02/16/22: 05/12/22: 48   Time 12    Period Weeks    Status Partially met    Target Date 03/17/22      PT LONG TERM GOAL #3   Title Pt will decrease 5TSTS by at least 3 seconds in order to demonstrate clinically significant improvement in LE strength.    Baseline  3/7: 15.36 sec with use of BUEs to assist 4/5: 9.6 seconds SUE support    Time 12    Period Weeks    Status Achieved    Target Date 03/17/22      PT LONG TERM GOAL #4   Title Pt will improve ABC by at  least 13% in order to demonstrate clinically significant improvement in balance confidence.    Baseline 3/7: 50.63% 4/5: 62% 5/31= 63%; 7/20: 70.31%   Time 12    Period Weeks    Status Achieved   Target Date 03/17/22      PT LONG TERM GOAL #5   Title Patient will increase 10 meter walk test to >1.66ms as to improve gait speed for better community ambulation and to reduce fall risk.    Baseline 3/7: 0.75 m/s with 4WW 4/5: 0.96 m/s with 4 WW; 10/28/2021= 0.97 m/s with 43XT 7/20: 0.96 m/s with 4WW 12/20: 1.07 m/s   Time 12    Period Weeks    Status Met    Target Date 03/17/22        Additional Long Term Goals   Additional Long Term Goals Yes      PT LONG TERM GOAL #6   Title Patient will demonstrate improved balance by exhibiting ability to bowler stance (wide tandem) bilat without UE support for >60sec to demonstrate improved frontal plane postural control.    Baseline 01/20/22: 30sec bilat, partail hand support to perform on Rt side only    Time 12    Period Weeks    Status Revised    Target Date 03/17/22        PT LONG TERM GOAL #7   Title Patient will present with improved right knee AROM <10 deg knee ext from zero for decreased limp and leg length discrepancy to aide in walking and weight shifting.    Baseline 10/28/2021= 15 deg from zero (right knee ext) ; 7/20: 3 deg from zero    Time 12    Period Weeks    Status Achieved    Target Date 03/17/22       PT LONG TERM GOAL #8   Title Patient will demo modified Ind floor to sit transfer using chair for improved ability to get up off the floor for decreased caregiver or EMS Support.    Baseline 10/28/2021= Not formally assessed yet patient reports difficulty and desire to practice to be able to perform on her own.; 7/20: CGA and  min VC/demo to complete floor to stand transfer, use of chair for UE support 9/19: able to complete floor to and from  stand transfer with min cueing 12/20: Able to complete with use of UE assist on supportive surface    Time 12    Period Weeks    Status Met     Target Date 03/17/22             Plan     Clinical Impression Statement Patient presents for discharge therapy this date.  Patient has met or nearly met all of her physical therapy goals and has been thoroughly instructed and advanced home exercise program [place] program in order to continue to maintain the progress she is made in physical therapy.  Patient will be joining [facility] where she will have some instruction as needed as well as access to Silver sneakers exercise classes.  Patient able to traverse curbs with her cane is also able to ambulate at safe community speeds when utilizing her rolling walker.  Patient will be discharged from formal physical therapy at this time and all questions answered and patient happy with physical therapy progress.     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, B THA x2, diastolic heart failure,  back pain, tachycardia    Examination-Activity Limitations Bend;Reach Overhead;Stairs;Transfers;Lift;Squat;Locomotion Level;Carry;Stand;Dressing    Examination-Participation Restrictions Volunteer;Community Activity;Shop;Cleaning;Laundry;Yard Work    [employer]chant [employer] officer Evolving/Moderate complexity    Rehab Potential Good    PT Frequency 2x / week    PT Duration 12 weeks    PT Treatment/Interventions ADLs/Self Care Home Management;Aquatic Therapy;Biofeedback;Canalith Repostioning;Cryotherapy;Electrical Stimulation;Moist Heat;Traction;Ultrasound;DME Instruction;Gait training;Functional Biochemist, clinical;Therapeutic activities;Therapeutic  exercise;Balance training;Neuromuscular re-education;Patient/family education;Orthotic Fit/Training;Wheelchair mobility training;Manual techniques;Passive range of motion;Scar mobilization;Dry needling;Energy conservation;Splinting;Taping;Vestibular;Visual/perceptual remediation/compensation;Joint Manipulations    PT Next Visit Plan D/C next session        Consulted and Agree with Plan of Care Patient            Particia Lather PT  05/19/22, 2:52 PM

## 2022-05-25 ENCOUNTER — Ambulatory Visit: Payer: Medicare Other

## 2022-05-27 ENCOUNTER — Ambulatory Visit: Payer: Medicare Other

## 2022-05-27 ENCOUNTER — Other Ambulatory Visit: Payer: Self-pay | Admitting: Family Medicine

## 2022-05-27 ENCOUNTER — Encounter: Payer: Self-pay | Admitting: Family Medicine

## 2022-05-27 DIAGNOSIS — G4733 Obstructive sleep apnea (adult) (pediatric): Secondary | ICD-10-CM

## 2022-06-01 ENCOUNTER — Ambulatory Visit: Payer: Medicare Other

## 2022-06-17 ENCOUNTER — Ambulatory Visit (INDEPENDENT_AMBULATORY_CARE_PROVIDER_SITE_OTHER): Payer: Medicare Other | Admitting: Family Medicine

## 2022-06-17 ENCOUNTER — Encounter: Payer: Self-pay | Admitting: Family Medicine

## 2022-06-17 ENCOUNTER — Telehealth: Payer: Self-pay

## 2022-06-17 VITALS — BP 116/67 | HR 64 | Temp 98.8°F | Ht 64.0 in | Wt 174.8 lb

## 2022-06-17 DIAGNOSIS — I10 Essential (primary) hypertension: Secondary | ICD-10-CM | POA: Diagnosis not present

## 2022-06-17 DIAGNOSIS — G4733 Obstructive sleep apnea (adult) (pediatric): Secondary | ICD-10-CM | POA: Diagnosis not present

## 2022-06-17 DIAGNOSIS — E785 Hyperlipidemia, unspecified: Secondary | ICD-10-CM

## 2022-06-17 DIAGNOSIS — F339 Major depressive disorder, recurrent, unspecified: Secondary | ICD-10-CM

## 2022-06-17 LAB — URINALYSIS, ROUTINE W REFLEX MICROSCOPIC
Bilirubin, UA: NEGATIVE
Glucose, UA: NEGATIVE
Ketones, UA: NEGATIVE
Leukocytes,UA: NEGATIVE
Nitrite, UA: NEGATIVE
Protein,UA: NEGATIVE
Specific Gravity, UA: 1.02 (ref 1.005–1.030)
Urobilinogen, Ur: 1 mg/dL (ref 0.2–1.0)
pH, UA: 6 (ref 5.0–7.5)

## 2022-06-17 LAB — MICROSCOPIC EXAMINATION: Bacteria, UA: NONE SEEN

## 2022-06-17 MED ORDER — BUPROPION HCL ER (XL) 150 MG PO TB24: 150.0000 mg | ORAL_TABLET | Freq: Every day | ORAL | 1 refills | Status: DC

## 2022-06-17 MED ORDER — TRAZODONE HCL 50 MG PO TABS: ORAL_TABLET | ORAL | 1 refills | Status: DC

## 2022-06-17 MED ORDER — SERTRALINE HCL 50 MG PO TABS: ORAL_TABLET | ORAL | 1 refills | Status: DC

## 2022-06-17 MED ORDER — METOPROLOL SUCCINATE ER 25 MG PO TB24: 12.5000 mg | ORAL_TABLET | Freq: Every day | ORAL | 1 refills | Status: DC | PRN

## 2022-06-17 NOTE — Progress Notes (Signed)
BP 116/67   Pulse 64   Temp 98.8 F (37.1 C) (Oral)   Ht '5\' 4"'$  (1.626 m)   Wt 174 lb 12.8 oz (79.3 kg)   SpO2 97%   BMI 30.00 kg/m    Subjective:    Patient ID: Mary [name], female    DOB: [birth year], 82 y.o.   MRN: [id number]  HPI: Mary [name] is a 82 y.o. female  No chief complaint on file.  HYPERTENSION / HYPERLIPIDEMIA Satisfied with current treatment? yes Duration of hypertension: chronic BP monitoring frequency: not checking BP medication side effects: no Past BP meds: metoprolol Duration of hyperlipidemia: chronic Cholesterol medication side effects: no Cholesterol supplements: none Past cholesterol medications: pravastatin Medication compliance: excellent compliance Aspirin: no Recent stressors: no Recurrent headaches: no Visual changes: no Palpitations: yes Dyspnea: no Chest pain: no Lower extremity edema: no Dizzy/lightheaded: no  DEPRESSION Mood status: controlled Satisfied with current treatment?: yes Symptom severity: mild  Duration of current treatment : chronic Side effects: no Medication compliance: excellent compliance Psychotherapy/counseling: no  Previous psychiatric medications: wellbutrin, sertraline Depressed mood: no Anxious mood: no Anhedonia: no Significant weight loss or gain: no Insomnia: no  Fatigue: yes Feelings of worthlessness or guilt: no Impaired concentration/indecisiveness: no Suicidal ideations: no Hopelessness: no Crying spells: no    01/15/2022   11:02 AM 12/16/2021   11:41 AM 11/03/2021   12:28 PM 07/30/2021   10:55 AM 07/02/2021    3:05 PM  Depression screen PHQ 2/9  Decreased Interest 0 0 0 1   Down, Depressed, Hopeless 0 1 0 2 2  PHQ - 2 Score 0 1 0 3 2  Altered sleeping 1 1  0 0  Tired, decreased energy 0 0  0 0  Change in appetite 0 0  0 0  Feeling bad or failure about yourself  0 1  0 0  Trouble concentrating 0 0  0 2  Moving slowly or fidgety/restless 0 0  1 0  Suicidal thoughts 0 0   0  PHQ-9 Score '1  3  4 4  '$ Difficult doing work/chores Not difficult at all    Somewhat difficult      01/15/2022   11:02 AM 12/16/2021   11:41 AM 07/30/2021   10:56 AM 07/02/2021    3:06 PM  GAD 7 : Generalized Anxiety Score  Nervous, Anxious, on Edge 0 0 0 1  Control/stop worrying 0 0 0 2  Worry too much - different things 0 0 0 1  Trouble relaxing 0 0 0 0  Restless 0 0 0 0  Easily annoyed or irritable 0 0 0 0  Afraid - awful might happen 0 0 0 2  Total GAD 7 Score 0 0 0 6  Anxiety Difficulty Not difficult at all Not difficult at all  Somewhat difficult    SLEEP APNEA- did her CPAP titration last week- have not gotten the results yet Sleep apnea status: uncontrolled Duration: chronic Satisfied with current treatment?:  no CPAP use:  no Sleep quality with CPAP use:  doesn't have it yet Last sleep study: last week  Wakes feeling refreshed:  no Daytime hypersomnolence:  yes Fatigue:  yes Insomnia:  no Good sleep hygiene:  yes Difficulty falling asleep:  no Difficulty staying asleep:  no Snoring bothers bed partner:  no Observed apnea by bed partner: no Obesity:  yes Hypertension: yes  Pulmonary hypertension:  no Coronary artery disease:  no  Relevant past medical, surgical, family and social history reviewed and  updated as indicated. Interim medical history since our last visit reviewed. Allergies and medications reviewed and updated.  Review of Systems  Constitutional: Negative.   Respiratory: Negative.    Cardiovascular: Negative.   Gastrointestinal: Negative.   Musculoskeletal: Negative.   Neurological: Negative.   Psychiatric/Behavioral: Negative.      Per HPI unless specifically indicated above     Objective:    BP 116/67   Pulse 64   Temp 98.8 F (37.1 C) (Oral)   Ht '5\' 4"'$  (1.626 m)   Wt 174 lb 12.8 oz (79.3 kg)   SpO2 97%   BMI 30.00 kg/m   Wt Readings from Last 3 Encounters:  06/17/22 174 lb 12.8 oz (79.3 kg)  01/15/22 179 lb 12.8 oz (81.6 kg)  12/16/21 179 lb  12.8 oz (81.6 kg)    Physical Exam Vitals and nursing note reviewed.  Constitutional:      General: She is not in acute distress.    Appearance: Normal appearance. She is not ill-appearing, toxic-appearing or diaphoretic.  HENT:     Head: Normocephalic and atraumatic.     Right Ear: External ear normal.     Left Ear: External ear normal.     Nose: Nose normal.     Mouth/Throat:     Mouth: Mucous membranes are moist.     Pharynx: Oropharynx is clear.  Eyes:     General: No scleral icterus.       Right eye: No discharge.        Left eye: No discharge.     Extraocular Movements: Extraocular movements intact.     Conjunctiva/sclera: Conjunctivae normal.     Pupils: Pupils are equal, round, and reactive to light.  Cardiovascular:     Rate and Rhythm: Normal rate and regular rhythm.     Pulses: Normal pulses.     Heart sounds: Normal heart sounds. No murmur heard.    No friction rub. No gallop.  Pulmonary:     Effort: Pulmonary effort is normal. No respiratory distress.     Breath sounds: Normal breath sounds. No stridor. No wheezing, rhonchi or rales.  Chest:     Chest wall: No tenderness.  Musculoskeletal:        General: Normal range of motion.     Cervical back: Normal range of motion and neck supple.  Skin:    General: Skin is warm and dry.     Capillary Refill: Capillary refill takes less than 2 seconds.     Coloration: Skin is not jaundiced or pale.     Findings: No bruising, erythema, lesion or rash.  Neurological:     General: No focal deficit present.     Mental Status: She is alert and oriented to person, place, and time. Mental status is at baseline.  Psychiatric:        Mood and Affect: Mood normal.        Behavior: Behavior normal.        Thought Content: Thought content normal.        Judgment: Judgment normal.     Results for orders placed or performed in visit on 12/16/21  Urine Culture   Specimen: Urine   UR  Result Value Ref Range   Urine Culture,  Routine Final report    Organism ID, Bacteria Comment   Urinalysis, Routine w reflex microscopic  Result Value Ref Range   Specific Gravity, UA >1.030 (H) 1.005 - 1.030   pH, UA 5.5 5.0 - 7.5  Color, UA Yellow Yellow   Appearance Ur Clear Clear   Leukocytes,UA Negative Negative   Protein,UA Negative Negative/Trace   Glucose, UA Negative Negative   Ketones, UA Negative Negative   RBC, UA Negative Negative   Bilirubin, UA Negative Negative   Urobilinogen, Ur 0.2 0.2 - 1.0 mg/dL   Nitrite, UA Negative Negative  Comprehensive metabolic panel  Result Value Ref Range   Glucose 147 (H) 70 - 99 mg/dL   BUN 17 8 - 27 mg/dL   Creatinine, Ser 0.94 0.57 - 1.00 mg/dL   eGFR 62 >59 mL/min/1.73   BUN/Creatinine Ratio 18 12 - 28   Sodium 142 134 - 144 mmol/L   Potassium 4.7 3.5 - 5.2 mmol/L   Chloride 103 96 - 106 mmol/L   CO2 24 20 - 29 mmol/L   Calcium 9.7 8.7 - 10.3 mg/dL   Total Protein 7.0 6.0 - 8.5 g/dL   Albumin 4.6 3.8 - 4.8 g/dL   Globulin, Total 2.4 1.5 - 4.5 g/dL   Albumin/Globulin Ratio 1.9 1.2 - 2.2   Bilirubin Total 0.6 0.0 - 1.2 mg/dL   Alkaline Phosphatase 114 44 - 121 IU/L   AST 20 0 - 40 IU/L   ALT 14 0 - 32 IU/L  CBC with Differential/Platelet  Result Value Ref Range   WBC 4.6 3.4 - 10.8 x10E3/uL   RBC 4.36 3.77 - 5.28 x10E6/uL   Hemoglobin 13.3 11.1 - 15.9 g/dL   Hematocrit 41.5 34.0 - 46.6 %   MCV 95 79 - 97 fL   MCH 30.5 26.6 - 33.0 pg   MCHC 32.0 31.5 - 35.7 g/dL   RDW 13.1 11.7 - 15.4 %   Platelets 214 150 - 450 x10E3/uL   Neutrophils 64 Not Estab. %   Lymphs 28 Not Estab. %   Monocytes 6 Not Estab. %   Eos 1 Not Estab. %   Basos 1 Not Estab. %   Neutrophils Absolute 3.0 1.4 - 7.0 x10E3/uL   Lymphocytes Absolute 1.3 0.7 - 3.1 x10E3/uL   Monocytes Absolute 0.3 0.1 - 0.9 x10E3/uL   EOS (ABSOLUTE) 0.0 0.0 - 0.4 x10E3/uL   Basophils Absolute 0.0 0.0 - 0.2 x10E3/uL   Immature Granulocytes 0 Not Estab. %   Immature Grans (Abs) 0.0 0.0 - 0.1 x10E3/uL   Lipid Panel w/o Chol/HDL Ratio  Result Value Ref Range   Cholesterol, Total 170 100 - 199 mg/dL   Triglycerides 88 0 - 149 mg/dL   HDL 76 >39 mg/dL   VLDL Cholesterol Cal 16 5 - 40 mg/dL   LDL Chol Calc (NIH) 78 0 - 99 mg/dL  Microalbumin, Urine Waived  Result Value Ref Range   Microalb, Ur Waived 10 0 - 19 mg/L   Creatinine, Urine Waived 100 10 - 300 mg/dL   Microalb/Creat Ratio <30 <30 mg/g  TSH  Result Value Ref Range   TSH 1.490 0.450 - 4.500 uIU/mL  Hepatitis C Antibody  Result Value Ref Range   Hep C Virus Ab Non Reactive Non Reactive      Assessment & Plan:   Problem List Items Addressed This Visit       Cardiovascular and Mediastinum   Primary hypertension    Under good control on current regimen. Continue current regimen. Continue to monitor. Call with any concerns. Refills given. Labs drawn today.       Relevant Medications   metoprolol succinate (TOPROL-XL) 25 MG 24 hr tablet   Other Relevant Orders   CBC  with Differential/Platelet   Comprehensive metabolic panel   Urinalysis, Routine w reflex microscopic   TSH     Respiratory   OSA (obstructive sleep apnea)    Had CPAP titration done last week. Await results. Call with any concerns.       Relevant Orders   CBC with Differential/Platelet   Comprehensive metabolic panel     Other   Hyperlipidemia    Under good control on current regimen. Continue current regimen. Continue to monitor. Call with any concerns. Refills up to date. Labs drawn today.       Relevant Medications   metoprolol succinate (TOPROL-XL) 25 MG 24 hr tablet   Other Relevant Orders   CBC with Differential/Platelet   Comprehensive metabolic panel   Lipid Panel w/o Chol/HDL Ratio   Depression, recurrent ([place]) - Primary    Under good control on current regimen. Continue current regimen. Continue to monitor. Call with any concerns. Refills given.        Relevant Medications   buPROPion (WELLBUTRIN XL) 150 MG 24 hr tablet    sertraline (ZOLOFT) 50 MG tablet   traZODone (DESYREL) 50 MG tablet   Other Relevant Orders   CBC with Differential/Platelet   Comprehensive metabolic panel     Follow up plan: Return in about 5 months (around 11/16/2022) for Wellness/Follow up.

## 2022-06-17 NOTE — Assessment & Plan Note (Signed)
Under good control on current regimen. Continue current regimen. Continue to monitor. Call with any concerns. Refills given. Labs drawn today.

## 2022-06-17 NOTE — Telephone Encounter (Signed)
Immunizations have been updated into patient's chart via [facility].

## 2022-06-17 NOTE — Assessment & Plan Note (Signed)
Under good control on current regimen. Continue current regimen. Continue to monitor. Call with any concerns. Refills given.   

## 2022-06-17 NOTE — Telephone Encounter (Signed)
-----  Message from Valerie Roys, DO sent at 06/17/2022 11:18 AM EST ----- Flu and RSV [facility] [phone]

## 2022-06-17 NOTE — Assessment & Plan Note (Signed)
Under good control on current regimen. Continue current regimen. Continue to monitor. Call with any concerns. Refills up to date. Labs drawn today.

## 2022-06-17 NOTE — Assessment & Plan Note (Signed)
Had CPAP titration done last week. Await results. Call with any concerns.

## 2022-06-18 LAB — CBC WITH DIFFERENTIAL/PLATELET
Basophils Absolute: 0 10*3/uL (ref 0.0–0.2)
Basos: 1 %
EOS (ABSOLUTE): 0.1 10*3/uL (ref 0.0–0.4)
Eos: 2 %
Hematocrit: 40.4 % (ref 34.0–46.6)
Hemoglobin: 13.4 g/dL (ref 11.1–15.9)
Immature Grans (Abs): 0 10*3/uL (ref 0.0–0.1)
Immature Granulocytes: 0 %
Lymphocytes Absolute: 1.2 10*3/uL (ref 0.7–3.1)
Lymphs: 25 %
MCH: 31.4 pg (ref 26.6–33.0)
MCHC: 33.2 g/dL (ref 31.5–35.7)
MCV: 95 fL (ref 79–97)
Monocytes Absolute: 0.3 10*3/uL (ref 0.1–0.9)
Monocytes: 7 %
Neutrophils Absolute: 3.1 10*3/uL (ref 1.4–7.0)
Neutrophils: 65 %
Platelets: 191 10*3/uL (ref 150–450)
RBC: 4.27 x10E6/uL (ref 3.77–5.28)
RDW: 13.2 % (ref 11.7–15.4)
WBC: 4.8 10*3/uL (ref 3.4–10.8)

## 2022-06-18 LAB — COMPREHENSIVE METABOLIC PANEL
ALT: 17 IU/L (ref 0–32)
AST: 22 IU/L (ref 0–40)
Albumin/Globulin Ratio: 1.8 (ref 1.2–2.2)
Albumin: 4.5 g/dL (ref 3.8–4.8)
Alkaline Phosphatase: 109 IU/L (ref 44–121)
BUN/Creatinine Ratio: 17 (ref 12–28)
BUN: 16 mg/dL (ref 8–27)
Bilirubin Total: 0.6 mg/dL (ref 0.0–1.2)
CO2: 23 mmol/L (ref 20–29)
Calcium: 9.8 mg/dL (ref 8.7–10.3)
Chloride: 103 mmol/L (ref 96–106)
Creatinine, Ser: 0.93 mg/dL (ref 0.57–1.00)
Globulin, Total: 2.5 g/dL (ref 1.5–4.5)
Glucose: 103 mg/dL — ABNORMAL HIGH (ref 70–99)
Potassium: 4.8 mmol/L (ref 3.5–5.2)
Sodium: 141 mmol/L (ref 134–144)
Total Protein: 7 g/dL (ref 6.0–8.5)
eGFR: 63 mL/min/{1.73_m2} (ref >59)

## 2022-06-18 LAB — LIPID PANEL W/O CHOL/HDL RATIO
Cholesterol, Total: 158 mg/dL (ref 100–199)
HDL: 72 mg/dL (ref >39)
LDL Chol Calc (NIH): 72 mg/dL (ref 0–99)
Triglycerides: 71 mg/dL (ref 0–149)
VLDL Cholesterol Cal: 14 mg/dL (ref 5–40)

## 2022-06-18 LAB — TSH: TSH: 1.6 u[IU]/mL (ref 0.450–4.500)

## 2022-06-29 ENCOUNTER — Telehealth: Payer: Self-pay

## 2022-06-29 NOTE — Telephone Encounter (Signed)
Requested paperwork and prescription is placed in provider's folder for signature.

## 2022-06-29 NOTE — Telephone Encounter (Signed)
-----  Message from Valerie Roys, [place] sent at 06/27/2022 11:39 AM EST ----- Please write Rx and I'll sign  Rx nasal CPCP at 9cm H20 with nasal pillow mask and humidification Dx: OSA

## 2022-06-30 NOTE — Telephone Encounter (Signed)
Completed paperwork has been faxed to [facility].

## 2022-07-09 ENCOUNTER — Telehealth: Payer: Self-pay | Admitting: Family Medicine

## 2022-07-09 NOTE — Telephone Encounter (Signed)
Yes please

## 2022-07-09 NOTE — Telephone Encounter (Signed)
Pt scheduled 3/7

## 2022-07-09 NOTE — Telephone Encounter (Signed)
Kim with [facility] called saying the patient needs a new C Pap machine.  They have notes from August 23 and they need something saying how long the patient has been having symptoms.  She said it does not have to be formal just a statement saying how long.  CB#  [phone]  FAX # [phone]

## 2022-07-15 ENCOUNTER — Encounter: Payer: Self-pay | Admitting: Family Medicine

## 2022-07-15 ENCOUNTER — Ambulatory Visit (INDEPENDENT_AMBULATORY_CARE_PROVIDER_SITE_OTHER): Payer: Medicare Other | Admitting: Family Medicine

## 2022-07-15 VITALS — BP 102/63 | HR 65 | Temp 98.6°F | Wt 178.4 lb

## 2022-07-15 DIAGNOSIS — Z538 Procedure and treatment not carried out for other reasons: Secondary | ICD-10-CM

## 2022-07-21 NOTE — Progress Notes (Signed)
Appointment not needed. Cancelled

## 2022-08-02 ENCOUNTER — Other Ambulatory Visit: Payer: Self-pay

## 2022-08-02 DIAGNOSIS — R0683 Snoring: Secondary | ICD-10-CM

## 2022-08-02 DIAGNOSIS — G4733 Obstructive sleep apnea (adult) (pediatric): Secondary | ICD-10-CM

## 2022-08-05 ENCOUNTER — Ambulatory Visit: Payer: Medicare Other | Admitting: Family Medicine

## 2022-08-16 ENCOUNTER — Other Ambulatory Visit: Payer: Self-pay | Admitting: Cardiovascular Disease

## 2022-08-16 NOTE — Telephone Encounter (Signed)
Good Morning,  Could you please schedule a 12 month follow up visit for this patient? This patient was last seen by Dr. Rockey Situ on 09-01-21. Thank you so much.

## 2022-08-18 NOTE — Telephone Encounter (Signed)
Pt is scheduled on 4/15

## 2022-08-23 ENCOUNTER — Other Ambulatory Visit: Payer: Self-pay | Admitting: Cardiovascular Disease

## 2022-09-08 ENCOUNTER — Other Ambulatory Visit: Payer: Self-pay | Admitting: Cardiovascular Disease

## 2022-09-10 ENCOUNTER — Telehealth: Payer: Self-pay

## 2022-09-10 ENCOUNTER — Other Ambulatory Visit: Payer: Self-pay | Admitting: Cardiovascular Disease

## 2022-09-10 DIAGNOSIS — Z1231 Encounter for screening mammogram for malignant neoplasm of breast: Secondary | ICD-10-CM

## 2022-09-10 NOTE — Addendum Note (Signed)
Addended by: Dorcas Carrow on: 09/10/2022 12:18 PM   Modules accepted: Orders

## 2022-09-10 NOTE — Telephone Encounter (Signed)
Copied from CRM [id number]. Topic: General - Other >> Sep 09, 2022  2:38 PM Carrielelia G wrote: Reason for CRM: please call patient regarding her last breast exam and also a letter received from [facility] breast center stating i need to schedule another exam but a referral is needed please advise

## 2022-09-10 NOTE — Telephone Encounter (Signed)
Order in.

## 2022-09-10 NOTE — Telephone Encounter (Signed)
Left message for patient to notify her that Dr Laural Benes has placed the order for her to have her repeat Mammogram order. The next recommended steps would be to reach out to [facility] to get an appointment scheduled.   OK for [facility] to give note if patient calls back.

## 2022-09-13 ENCOUNTER — Encounter: Payer: Self-pay | Admitting: Medical

## 2022-09-13 ENCOUNTER — Ambulatory Visit: Payer: Medicare Other | Attending: Medical | Admitting: Medical

## 2022-09-13 VITALS — BP 110/68 | HR 63 | Ht 64.0 in | Wt 175.0 lb

## 2022-09-13 DIAGNOSIS — I10 Essential (primary) hypertension: Secondary | ICD-10-CM | POA: Diagnosis present

## 2022-09-13 DIAGNOSIS — I479 Paroxysmal tachycardia, unspecified: Secondary | ICD-10-CM | POA: Diagnosis present

## 2022-09-13 DIAGNOSIS — I890 Lymphedema, not elsewhere classified: Secondary | ICD-10-CM | POA: Diagnosis present

## 2022-09-13 DIAGNOSIS — E782 Mixed hyperlipidemia: Secondary | ICD-10-CM | POA: Diagnosis present

## 2022-09-13 DIAGNOSIS — I5030 Unspecified diastolic (congestive) heart failure: Secondary | ICD-10-CM | POA: Diagnosis present

## 2022-09-13 MED ORDER — METOPROLOL SUCCINATE ER 25 MG PO TB24: 25.0000 mg | ORAL_TABLET | Freq: Every day | ORAL | 3 refills | Status: DC

## 2022-09-13 NOTE — Patient Instructions (Addendum)
Medication Instructions:  Your physician recommends that you continue on your current medications as directed. Please refer to the Current Medication list given to you today.  *If you need a refill on your cardiac medications before your next appointment, please call your pharmacy*   Lab Work: -None ordered  If you have labs (blood work) drawn today and your tests are completely normal, you will receive your results only by: MyChart Message (if you have MyChart) OR A paper copy in the mail If you have any lab test that is abnormal or we need to change your treatment, we will call you to review the results.  Testing/Procedures: -None ordered  Follow-Up: At [facility], you and your health needs are our priority.  As part of our continuing mission to provide you with exceptional heart care, we have created designated Provider Care Teams.  These Care Teams include your primary Cardiologist (physician) and Advanced Practice Providers (APPs -  Physician Assistants and Nurse Practitioners) who all work together to provide you with the care you need, when you need it.  We recommend signing up for the patient portal called "MyChart".  Sign up information is provided on this After Visit Summary.  MyChart is used to connect with patients for Virtual Visits (Telemedicine).  Patients are able to view lab/test results, encounter notes, upcoming appointments, etc.  Non-urgent messages can be sent to your provider as well.   To learn more about what you can do with MyChart, go to [email or web address].    Your next appointment:   1 year(s)  Provider:   You may see Julien Nordmann, MD or one of the following Advanced Practice Providers on your designated Care Team:   Nicolasa Ducking, NP Eula Listen, PA-C Cadence Fransico Michael, PA-C Charlsie Quest, NP   Other Instructions -None

## 2022-09-13 NOTE — Progress Notes (Signed)
Cardiology Office Note:    Date:  09/13/2022   ID:  Mary [name], DOB 30-Jan-1941, MRN [id number]  PCP:  Dorcas Carrow, DO  [facility] Cardiologist:  None  [facility] Electrophysiologist:  None   Referring MD: Dorcas Carrow, DO   Chief Complaint: 1 year follow-up  History of Present Illness:    Mary [name] is a 82 y.o. 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  She has a history of symptomatic tachycardia in the past with unclear etiology.  She had a heart monitor done by outside hospital reportedly showed 4 beats of VT and was started on metoprolol.  Patient was last seen March 2023 was overall doing well.  She had lost weight with diet changes.  Today, the patient is overall doing well from a cardiac perspective. She has fallen in the last year from a mechanical fall. She has done a lot of PT since then and this has helped her overall function. She has lymphedema. She uses a lymphedema pump three times weekly. She has a cane and a walker. No dizziness or lightheadedness. No chest pain or shortness of breath. She has OSA and uses a CPAP machine. She takes lasix every other day.   Past Medical History:  Diagnosis Date   Arrhythmia    Back pain    Basal cell carcinoma    L lower leg txted in [place] ~ 2016   Depression with anxiety    Hip pain    Hyperlipidemia    Hypertension    Neuromuscular disorder    Paroxysmal tachycardia     Past Surgical History:  Procedure Laterality Date   ABDOMINAL HYSTERECTOMY     BREAST BIOPSY     BREAST EXCISIONAL BIOPSY Left 2002   benign   CARDIAC CATHETERIZATION     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   x2   TOTAL HIP ARTHROPLASTY Bilateral    done twice    Current Medications: Current Meds  Medication Sig   buPROPion (WELLBUTRIN XL) 150 MG 24 hr tablet Take 1 tablet (150 mg total) by  mouth daily.   Cholecalciferol (VITAMIN D3) 25 MCG (1000 UT) CAPS Take 1,000 Units by mouth daily.   furosemide (LASIX) 20 MG tablet Take 1 tablet (20 mg total) by mouth every other day. Appointment needed for additional refill   pravastatin (PRAVACHOL) 40 MG tablet Take 1 tablet (40 mg total) by mouth daily. Appointment needed for additional refill   sertraline (ZOLOFT) 50 MG tablet TAKE 1 TABLET(50 MG) BY MOUTH DAILY   [DISCONTINUED] metoprolol succinate (TOPROL-XL) 25 MG 24 hr tablet TAKE 1 TABLET BY MOUTH DAILY     Allergies:   Codeine and Simvastatin   Social History   Socioeconomic History   Marital status: Widowed    Spouse name: Not on file   Number of children: Not on file   Years of education: Not on file   Highest education level: Not on file  Occupational History   Not on file  Tobacco Use   Smoking status: Former    Types: Cigarettes   Smokeless tobacco: Never  Vaping Use   Vaping Use: Never used  Substance and Sexual Activity   Alcohol use: Yes    Alcohol/week: 7.0 standard drinks of alcohol    Types: 7 Glasses of wine per week   Drug use: Never   Sexual activity: Not Currently  Other Topics Concern   Not on file  Social History Narrative   Not on file   Social Determinants of Health   Financial Resource Strain: Low Risk  (11/03/2021)   Overall Financial Resource Strain (CARDIA)    Difficulty of Paying Living Expenses: Not hard at all  Food Insecurity: No Food Insecurity (11/03/2021)   Hunger Vital Sign    Worried About Running Out of Food in the Last Year: Never true    Ran Out of Food in the Last Year: Never true  Transportation Needs: No Transportation Needs (11/03/2021)   PRAPARE - Administrator, Civil Service (Medical): No    Lack of Transportation (Non-Medical): No  Physical Activity: Inactive (11/03/2021)   Exercise Vital Sign    Days of Exercise per Week: 0 days    Minutes of Exercise per Session: 0 min  Stress: No Stress Concern Present  (11/03/2021)   [employer] of Occupational Health - Occupational Stress Questionnaire    Feeling of Stress : Not at all  Social Connections: Moderately Integrated (11/03/2021)   Social Connection and Isolation Panel [NHANES]    Frequency of Communication with Friends and Family: More than three times a week    Frequency of Social Gatherings with Friends and Family: Twice a week    Attends Religious Services: More than 4 times per year    Active Member of [employer] or Organizations: Yes    Attends Banker Meetings: More than 4 times per year    Marital Status: Widowed     Family History: The patient's family history includes CAD in her mother; Hypertension in her mother and sister; Lung cancer in her father; Multiple sclerosis in her sister; Pancreatic cancer in her mother. There is no history of Breast cancer.  ROS:   Please see the history of present illness.     All other systems reviewed and are negative.  EKGs/Labs/Other Studies Reviewed:    The following studies were reviewed today:  N/A  EKG:  EKG is ordered today.  The ekg ordered today demonstrates NSR, 63bpm, TWI III  Recent Labs: 06/17/2022: ALT 17; BUN 16; Creatinine, Ser 0.93; Hemoglobin 13.4; Platelets 191; Potassium 4.8; Sodium 141; TSH 1.600  Recent Lipid Panel    Component Value Date/Time   CHOL 158 06/17/2022 1132   TRIG 71 06/17/2022 1132   HDL 72 06/17/2022 1132   CHOLHDL 2.5 06/17/2021 0945   LDLCALC 72 06/17/2022 1132     Physical Exam:    VS:  BP 110/68   Pulse 63   Ht 5\' 4"  (1.626 m)   Wt 175 lb (79.4 kg)   BMI 30.04 kg/m     Wt Readings from Last 3 Encounters:  09/13/22 175 lb (79.4 kg)  07/15/22 178 lb 6.4 oz (80.9 kg)  06/17/22 174 lb 12.8 oz (79.3 kg)     GEN:  Well nourished, well developed in no acute distress HEENT: Normal NECK: No JVD; No carotid bruits LYMPHATICS: No lymphadenopathy CARDIAC: RRR, no murmurs, rubs, gallops RESPIRATORY:  Clear to auscultation without  rales, wheezing or rhonchi  ABDOMEN: Soft, non-tender, non-distended MUSCULOSKELETAL:  No edema; No deformity  SKIN: Warm and dry NEUROLOGIC:  Alert and oriented x 3 PSYCHIATRIC:  Normal affect   ASSESSMENT:    1. Paroxysmal tachycardia   2. Essential hypertension   3. Lymphedema   4. Diastolic heart failure, unspecified HF chronicity   5. Hyperlipidemia, mixed    PLAN:    In order  of problems listed above:  Paroxysmal tachycardia The patient reports rare palpitations. These are overall controlled with Toprol 12.5mg  daily.   HTN BP is good today, continue Toprol 12.5mg  daily.   Lymphedema She has chronic lymphedema and uses pumps three times weekly. She takes lasix  every other day. Daughter in law wraps legs.   HLD LDL 72, Continue Pravastin.   Disposition: Follow up in 1 year(s) with MD    Signed, Denae Zulueta David Stall, PA-C  09/13/2022 10:27 AM    [facility]

## 2022-09-18 ENCOUNTER — Other Ambulatory Visit: Payer: Self-pay | Admitting: Cardiovascular Disease

## 2022-09-22 ENCOUNTER — Other Ambulatory Visit: Payer: Self-pay | Admitting: Family Medicine

## 2022-09-22 DIAGNOSIS — R921 Mammographic calcification found on diagnostic imaging of breast: Secondary | ICD-10-CM

## 2022-09-23 ENCOUNTER — Other Ambulatory Visit: Payer: Self-pay | Admitting: Family Medicine

## 2022-09-23 NOTE — Telephone Encounter (Signed)
Refused Zoloft and Wellbutrin refill requests because they are being requested too soon.  Both 06/17/2022 #90 with 1 refill remaining.

## 2022-10-11 ENCOUNTER — Ambulatory Visit
Admission: RE | Admit: 2022-10-11 | Discharge: 2022-10-11 | Disposition: A | Discharge status: Home / Self Care | Payer: Medicare Other | Source: Ambulatory Visit | Attending: Family Medicine | Admitting: Family Medicine

## 2022-10-11 DIAGNOSIS — R921 Mammographic calcification found on diagnostic imaging of breast: Secondary | ICD-10-CM | POA: Insufficient documentation

## 2022-10-16 ENCOUNTER — Other Ambulatory Visit: Payer: Self-pay | Admitting: Cardiovascular Disease

## 2022-10-18 ENCOUNTER — Other Ambulatory Visit: Payer: Self-pay | Admitting: Cardiovascular Disease

## 2022-10-18 ENCOUNTER — Other Ambulatory Visit: Payer: Self-pay

## 2022-11-02 ENCOUNTER — Telehealth: Payer: Self-pay | Admitting: Family Medicine

## 2022-11-02 NOTE — Telephone Encounter (Signed)
Copied from CRM [id number]. Topic: Medicare AWV >> Nov 02, 2022  1:12 PM Payton Doughty wrote: Reason for CRM: .LM 11/02/2022 to schedule AWV   Verlee Rossetti; Care Guide Ambulatory Clinical Support [facility]eb l [facility] Direct Dial: [phone]

## 2022-11-12 NOTE — Progress Notes (Signed)
Sleep Medicine   Office Visit  Patient Name: Mary [name] DOB: 22-Oct-1940 MRN [id number]    Chief Complaint: establish care/history of OSA  Brief History:  Chesney presents for an initial consult for sleep evaluation and to establish care. The patient has a 6 month history of sleep apnea and is currently on a CPAP. Sleep quality is fair to good. This is noted most nights. Prior to using a PAP, the patient reported the following symptoms:  snoring, trouble concentrating, brain fogginess . The patient goes to sleep at 1000 pm and wakes up at 0800 am and will wake up at least once in between to use the restroom, but does not have trouble returning to sleep. The patient relates depression a history of psychiatric problems. The Epworth Sleepiness Score is 7 out of 24 . The patient relates  Cardiovascular risk factors include: hypertension. . The patient is currently on a CPAP@ 9 cmH2O. However, patient turned in machine due to non-compliance and is interested in doing a restart. The patient reports not using her PAP and feels not as rested after sleeping with PAP mostly due to the headgear.  The patient reports not knowing for sure if she was benefiting from PAP use and would like for her to continue using PAP. Reported sleepiness is  not as improved. The compliance download shows  43% compliance with an average use time of 4 hours 22  minutes. The AHI is 1.3.  The patient continues to require PAP therapy as a medical necessity in order to eliminate her sleep apnea.    ROS  General: (-) fever, (-) chills, (-) night sweat Nose and Sinuses: (-) nasal stuffiness or itchiness, (-) postnasal drip, (-) nosebleeds, (-) sinus trouble. Mouth and Throat: (-) sore throat, (-) hoarseness. Neck: (-) swollen glands, (-) enlarged thyroid, (-) neck pain. Respiratory: - cough, - shortness of breath, - wheezing. Neurologic: - numbness, - tingling. Psychiatric: + anxiety, + depression Sleep behavior: -sleep paralysis  -hypnogogic hallucinations -dream enactment      -vivid dreams -cataplexy -night terrors -sleep walking   Current Medication: Outpatient Encounter Medications as of 11/15/2022  Medication Sig Note   buPROPion (WELLBUTRIN XL) 150 MG 24 hr tablet Take 1 tablet (150 mg total) by mouth daily.    Cholecalciferol (VITAMIN D3) 25 MCG (1000 UT) CAPS Take 1,000 Units by mouth daily.    furosemide (LASIX) 20 MG tablet TAKE 1 TABLET BY MOUTH EVERY OTHER DAY    gabapentin (NEURONTIN) 300 MG capsule Take 300 mg by mouth 3 (three) times daily. (Patient not taking: Reported on 09/13/2022) 01/15/2022: prn   meloxicam (MOBIC) 7.5 MG tablet  (Patient not taking: Reported on 09/13/2022) 01/15/2022: prn   metoprolol succinate (TOPROL-XL) 25 MG 24 hr tablet Take 1 tablet (25 mg total) by mouth daily.    pravastatin (PRAVACHOL) 40 MG tablet TAKE 1 TABLET BY MOUTH DAILY NEEDS APPT    sertraline (ZOLOFT) 50 MG tablet TAKE 1 TABLET(50 MG) BY MOUTH DAILY    traMADol (ULTRAM) 50 MG tablet Take 50 mg by mouth every 6 (six) hours as needed. (Patient not taking: Reported on 09/13/2022)    traZODone (DESYREL) 50 MG tablet TAKE 1/2 TO 1 TABLET(25 TO 50 MG) BY MOUTH AT BEDTIME AS NEEDED FOR SLEEP (Patient not taking: Reported on 09/13/2022)    No facility-administered encounter medications on file as of 11/15/2022.    Surgical History: Past Surgical History:  Procedure Laterality Date   ABDOMINAL HYSTERECTOMY     BREAST BIOPSY  BREAST EXCISIONAL BIOPSY Left 2002   benign   CARDIAC CATHETERIZATION     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   x2   TOTAL HIP ARTHROPLASTY Bilateral    done twice    Medical History: Past Medical History:  Diagnosis Date   Arrhythmia    Back pain    Basal cell carcinoma    L lower leg txted in [place] ~ 2016   Depression with anxiety    Hip pain    Hyperlipidemia    Hypertension    Neuromuscular disorder (HCC)    Paroxysmal tachycardia (HCC)     Family History: Non contributory to the  present illness  Social History: Social History   Socioeconomic History   Marital status: Widowed    Spouse name: Not on file   Number of children: Not on file   Years of education: Not on file   Highest education level: Not on file  Occupational History   Not on file  Tobacco Use   Smoking status: Former    Types: Cigarettes   Smokeless tobacco: Never  Vaping Use   Vaping Use: Never used  Substance and Sexual Activity   Alcohol use: Yes    Alcohol/week: 7.0 standard drinks of alcohol    Types: 7 Glasses of wine per week   Drug use: Never   Sexual activity: Not Currently  Other Topics Concern   Not on file  Social History Narrative   Not on file   Social Determinants of Health   Financial Resource Strain: Low Risk  (11/03/2021)   Overall Financial Resource Strain (CARDIA)    Difficulty of Paying Living Expenses: Not hard at all  Food Insecurity: No Food Insecurity (11/03/2021)   Hunger Vital Sign    Worried About Running Out of Food in the Last Year: Never true    Ran Out of Food in the Last Year: Never true  Transportation Needs: No Transportation Needs (11/03/2021)   PRAPARE - Administrator, Civil Service (Medical): No    Lack of Transportation (Non-Medical): No  Physical Activity: Inactive (11/03/2021)   Exercise Vital Sign    Days of Exercise per Week: 0 days    Minutes of Exercise per Session: 0 min  Stress: No Stress Concern Present (11/03/2021)   [employer] of Occupational Health - Occupational Stress Questionnaire    Feeling of Stress : Not at all  Social Connections: Moderately Integrated (11/03/2021)   Social Connection and Isolation Panel [NHANES]    Frequency of Communication with Friends and Family: More than three times a week    Frequency of Social Gatherings with Friends and Family: Twice a week    Attends Religious Services: More than 4 times per year    Active Member of [employer] or Organizations: Yes    Attends Banker  Meetings: More than 4 times per year    Marital Status: Widowed  Intimate Partner Violence: Not At Risk (11/03/2021)   Humiliation, Afraid, Rape, and Kick questionnaire    Fear of Current or Ex-Partner: No    Emotionally Abused: No    Physically Abused: No    Sexually Abused: No    Vital Signs: There were no vitals taken for this visit. There is no height or weight on file to calculate BMI.   Examination: General Appearance: The patient is well-developed, well-nourished, and in no distress. Neck Circumference: 37 cm Skin: Gross inspection of skin unremarkable. Head: normocephalic, no gross deformities. Eyes: no gross deformities noted.  ENT: ears appear grossly normal Neurologic: Alert and oriented. No involuntary movements.    STOP BANG RISK ASSESSMENT S (snore) Have you been told that you snore?     YES   T (tired) Are you often tired, fatigued, or sleepy during the day?   NO  O (obstruction) Do you stop breathing, choke, or gasp during sleep? YES   P (pressure) Do you have or are you being treated for high blood pressure? YES   B (BMI) Is your body index greater than 35 kg/m? NO   A (age) Are you 66 years old or older? YES   N (neck) Do you have a neck circumference greater than 16 inches?   NO   G (gender) Are you a female? NO   TOTAL STOP/BANG "YES" ANSWERS 4                                                               A STOP-Bang score of 2 or less is considered low risk, and a score of 5 or more is high risk for having either moderate or severe OSA. For people who score 3 or 4, doctors may need to perform further assessment to determine how likely they are to have OSA.         EPWORTH SLEEPINESS SCALE:  Scale:  (0)= no chance of dozing; (1)= slight chance of dozing; (2)= moderate chance of dozing; (3)= high chance of dozing  Chance  Situtation    Sitting and reading: 1    Watching TV: 1    Sitting Inactive in public: 1    As a passenger in car: 1       Lying down to rest: 2    Sitting and talking: 0    Sitting quielty after lunch: 1    In a car, stopped in traffic: 0   TOTAL SCORE:   7 out of 24    SLEEP STUDIES:  PSG (04/2022) AHI 25/hr, min SPO2 85% Titration (05/2022) CPAP@ 9 cmH2O  CPAP COMPLIANCE DATA:  Date Range: 07/27/2022-11/02/2022  Average Daily Use: 4 hours 22 minutes  Median Use: 4 hours 21 minutes  Compliance for > 4 Hours: 43 %  AHI: 1.3 respiratory events per hour  Days Used: 65/99 days  Mask Leak: 36.1  95th Percentile Pressure: 9   LABS: No results found for this or any previous visit (from the past 2160 hour(s)).  Radiology: MM 3D DIAGNOSTIC MAMMOGRAM BILATERAL BREAST  Result Date: 10/11/2022 CLINICAL DATA:  Annual bilateral mammogram in 1 year follow-up of 2 groups of probably benign right breast calcifications. EXAM: DIGITAL DIAGNOSTIC BILATERAL MAMMOGRAM WITH TOMOSYNTHESIS TECHNIQUE: Bilateral digital diagnostic mammography and breast tomosynthesis was performed. COMPARISON:  Previous exam(s). ACR Breast Density Category c: The breasts are heterogeneously dense, which may obscure small masses. FINDINGS: Two groups of calcifications in the central and lower inner right breast are mammographically stable. Otherwise, no new or suspicious mammographic findings in either breast. The parenchymal pattern is unchanged. IMPRESSION: 1. Two groups of stable, probably benign right breast calcifications. Recommend a final follow-up in 1 year. 2. No mammographic evidence of malignancy on the left. RECOMMENDATION: Bilateral diagnostic mammogram in 1 year. I have discussed the findings and recommendations with the patient. If applicable, a reminder letter will  be sent to the patient regarding the next appointment. BI-RADS CATEGORY  3: Probably benign. Electronically Signed   By: Sande Brothers M.D.   On: 10/11/2022 10:12   No results found.  No results found.    Assessment and Plan: Patient Active  Problem List   Diagnosis Date Noted   OSA (obstructive sleep apnea) 05/27/2022   Insomnia 12/16/2021   Depression, recurrent (HCC) 12/16/2021   Diastolic heart failure (HCC) 07/30/2021   Hyperlipidemia 07/30/2021   Lymphedema 07/30/2021   Gait instability 06/02/2021   Primary hypertension 06/02/2021   1. OSA (obstructive sleep apnea) PLAN OSA:   Patient evaluation suggests high risk of sleep disordered breathing due to history of OSA with AHI of 25.  Patient has comorbid cardiovascular risk factors including: hypertension, paroxysmal tachycardia  which could be exacerbated by pathologic sleep-disordered breathing.  Suggest: cpap titration  to assess/treat the patient's sleep disordered breathing. The patient was also counselled on weight loss to optimize sleep health.  2. CPAP use counseling CPAP Counseling: had a lengthy discussion with the patient regarding the importance of PAP therapy in management of the sleep apnea. Patient appears to understand the risk factor reduction and also understands the risks associated with untreated sleep apnea. Patient will try to make a good faith effort to remain compliant with therapy. Also instructed the patient on proper cleaning of the device including the water must be changed daily if possible and use of distilled water is preferred. Patient understands that the machine should be regularly cleaned with appropriate recommended cleaning solutions that do not damage the PAP machine for example given white vinegar and water rinses. Other methods such as ozone treatment may not be as good as these simple methods to achieve cleaning.   3. Primary hypertension Hypertension Counseling:   The following hypertensive lifestyle modification were recommended and discussed:  1. Limiting alcohol intake to less than 1 oz/day of ethanol:(24 oz of beer or 8 oz of wine or 2 oz of 100-proof whiskey). 2. Take baby ASA 81 mg daily. 3. Importance of regular aerobic  exercise and losing weight. 4. Reduce dietary saturated fat and cholesterol intake for overall cardiovascular health. 5. Maintaining adequate dietary potassium, calcium, and magnesium intake. 6. Regular monitoring of the blood pressure. 7. Reduce sodium intake to less than 100 mmol/day (less than 2.3 gm of sodium or less than 6 gm of sodium choride)       General Counseling: I have discussed the findings of the evaluation and examination with Tobi Bastos.  I have also discussed any further diagnostic evaluation thatmay be needed or ordered today. Seraya verbalizes understanding of the findings of todays visit. We also reviewed her medications today and discussed drug interactions and side effects including but not limited excessive drowsiness and altered mental states. We also discussed that there is always a risk not just to her but also people around her. she has been encouraged to call the office with any questions or concerns that should arise related to todays visit.  No orders of the defined types were placed in this encounter.       I have personally obtained a history, evaluated the patient, evaluated pertinent data, formulated the assessment and plan and placed orders.   This patient was seen today by Emmaline Kluver, PA-C in collaboration with Dr. Freda Munro.   Yevonne Pax, MD [facility] Diplomate ABMS Pulmonary and Critical Care Medicine Sleep medicine

## 2022-11-15 ENCOUNTER — Ambulatory Visit (INDEPENDENT_AMBULATORY_CARE_PROVIDER_SITE_OTHER): Payer: Medicare Other | Admitting: Internal Medicine

## 2022-11-15 VITALS — BP 126/73 | HR 55 | Resp 18 | Ht 63.0 in | Wt 173.0 lb

## 2022-11-15 DIAGNOSIS — G4733 Obstructive sleep apnea (adult) (pediatric): Secondary | ICD-10-CM | POA: Diagnosis not present

## 2022-11-15 DIAGNOSIS — Z7189 Other specified counseling: Secondary | ICD-10-CM | POA: Diagnosis not present

## 2022-11-15 DIAGNOSIS — I10 Essential (primary) hypertension: Secondary | ICD-10-CM | POA: Diagnosis not present

## 2022-11-15 DIAGNOSIS — K649 Unspecified hemorrhoids: Secondary | ICD-10-CM | POA: Insufficient documentation

## 2022-11-15 NOTE — Patient Instructions (Signed)

## 2022-11-16 ENCOUNTER — Ambulatory Visit (INDEPENDENT_AMBULATORY_CARE_PROVIDER_SITE_OTHER): Payer: Medicare Other | Admitting: Family Medicine

## 2022-11-16 ENCOUNTER — Encounter: Payer: Self-pay | Admitting: Family Medicine

## 2022-11-16 VITALS — BP 123/70 | HR 76 | Temp 99.0°F | Wt 172.6 lb

## 2022-11-16 DIAGNOSIS — Z0000 Encounter for general adult medical examination without abnormal findings: Secondary | ICD-10-CM | POA: Diagnosis not present

## 2022-11-16 DIAGNOSIS — F339 Major depressive disorder, recurrent, unspecified: Secondary | ICD-10-CM | POA: Diagnosis not present

## 2022-11-16 DIAGNOSIS — I10 Essential (primary) hypertension: Secondary | ICD-10-CM | POA: Diagnosis not present

## 2022-11-16 DIAGNOSIS — E785 Hyperlipidemia, unspecified: Secondary | ICD-10-CM | POA: Diagnosis not present

## 2022-11-16 DIAGNOSIS — D692 Other nonthrombocytopenic purpura: Secondary | ICD-10-CM | POA: Diagnosis not present

## 2022-11-16 LAB — MICROALBUMIN, URINE WAIVED
Creatinine, Urine Waived: 100 mg/dL (ref 10–300)
Microalb, Ur Waived: 30 mg/L — ABNORMAL HIGH (ref 0–19)

## 2022-11-16 LAB — URINALYSIS, ROUTINE W REFLEX MICROSCOPIC
Bilirubin, UA: NEGATIVE
Glucose, UA: NEGATIVE
Ketones, UA: NEGATIVE
Leukocytes,UA: NEGATIVE
Nitrite, UA: NEGATIVE
Protein,UA: NEGATIVE
RBC, UA: NEGATIVE
Specific Gravity, UA: 1.02 (ref 1.005–1.030)
Urobilinogen, Ur: 1 mg/dL (ref 0.2–1.0)
pH, UA: 6.5 (ref 5.0–7.5)

## 2022-11-16 MED ORDER — SERTRALINE HCL 50 MG PO TABS: ORAL_TABLET | ORAL | 1 refills | Status: DC

## 2022-11-16 MED ORDER — BUPROPION HCL ER (XL) 150 MG PO TB24: 150.0000 mg | ORAL_TABLET | Freq: Every day | ORAL | 1 refills | Status: DC

## 2022-11-16 MED ORDER — TRAZODONE HCL 50 MG PO TABS: ORAL_TABLET | ORAL | 1 refills | Status: DC

## 2022-11-16 NOTE — Assessment & Plan Note (Signed)
Under good control on current regimen. Continue current regimen. Continue to monitor. Call with any concerns. Refills through cardiology. Labs drawn today.  

## 2022-11-16 NOTE — Assessment & Plan Note (Signed)
Reassured patient. CBC drawn today.

## 2022-11-16 NOTE — Progress Notes (Signed)
BP 123/70   Pulse 76   Temp 99 F (37.2 C) (Oral)   Wt 172 lb 9.6 oz (78.3 kg)   SpO2 98%   BMI 30.57 kg/m    Subjective:    Patient ID: Mary [name], female    DOB: 06/18/1940, 82 y.o.   MRN: [id number]  HPI: Mary [name] is a 82 y.o. female presenting on 11/16/2022 for comprehensive medical examination. Current medical complaints include:  HYPERTENSION / HYPERLIPIDEMIA Satisfied with current treatment? yes Duration of hypertension: chronic BP monitoring frequency: not checking BP medication side effects: no Past BP meds: lasix, metoprolol Duration of hyperlipidemia: chronic Cholesterol medication side effects: no Cholesterol supplements: none Past cholesterol medications: pravastatin Medication compliance: excellent compliance Aspirin: no Recent stressors: no Recurrent headaches: no Visual changes: no Palpitations: no Dyspnea: no Chest pain: no Lower extremity edema: no Dizzy/lightheaded: no  DEPRESSION Mood status: controlled Satisfied with current treatment?: yes Symptom severity: mild  Duration of current treatment : chronic Side effects: no Medication compliance: excellent compliance Psychotherapy/counseling: no  Previous psychiatric medications: wellbutrin, sertraline Depressed mood: no Anxious mood: no Anhedonia: no Significant weight loss or gain: no Insomnia: no  Fatigue: no Feelings of worthlessness or guilt: no Impaired concentration/indecisiveness: no Suicidal ideations: no Hopelessness: no Crying spells: no    11/16/2022   11:14 AM 07/15/2022   10:01 AM 01/15/2022   11:02 AM 12/16/2021   11:41 AM 11/03/2021   12:28 PM  Depression screen PHQ 2/9  Decreased Interest 0 0 0 0 0  Down, Depressed, Hopeless 0 0 0 1 0  PHQ - 2 Score 0 0 0 1 0  Altered sleeping 0 1 1 1    Tired, decreased energy 0 0 0 0   Change in appetite 0 0 0 0   Feeling bad or failure about yourself  0 0 0 1   Trouble concentrating 0 0 0 0   Moving slowly or fidgety/restless 1  0 0 0   Suicidal thoughts 0 0 0 0   PHQ-9 Score 1 1 1 3    Difficult doing work/chores  Not difficult at all Not difficult at all     Menopausal Symptoms: no  Functional Status Survey: Is the patient deaf or have difficulty hearing?: No Does the patient have difficulty seeing, even when wearing glasses/contacts?: No Does the patient have difficulty concentrating, remembering, or making decisions?: Yes Does the patient have difficulty walking or climbing stairs?: Yes Does the patient have difficulty dressing or bathing?: No Does the patient have difficulty doing errands alone such as visiting a doctor's office or shopping?: No     11/16/2022   11:13 AM 07/15/2022   10:00 AM 01/15/2022   11:02 AM 12/16/2021   11:42 AM 11/03/2021   12:14 PM  Fall Risk   Falls in the past year? 1 1 1 1 1   Number falls in past yr: 1 1 1 1  0  Injury with Fall? 0 1 0 0 0  Risk for fall due to : History of fall(s) No Fall Risks Impaired balance/gait;History of fall(s) No Fall Risks Impaired balance/gait  Follow up Falls evaluation completed Falls evaluation completed Falls evaluation completed Falls evaluation completed Falls evaluation completed;Education provided;Falls prevention discussed    Depression Screen    11/16/2022   11:14 AM 07/15/2022   10:01 AM 01/15/2022   11:02 AM 12/16/2021   11:41 AM 11/03/2021   12:28 PM  Depression screen PHQ 2/9  Decreased Interest 0 0 0 0 0  Down, Depressed,  Hopeless 0 0 0 1 0  PHQ - 2 Score 0 0 0 1 0  Altered sleeping 0 1 1 1    Tired, decreased energy 0 0 0 0   Change in appetite 0 0 0 0   Feeling bad or failure about yourself  0 0 0 1   Trouble concentrating 0 0 0 0   Moving slowly or fidgety/restless 1 0 0 0   Suicidal thoughts 0 0 0 0   PHQ-9 Score 1 1 1 3    Difficult doing work/chores  Not difficult at all Not difficult at all      Advanced Directives Does patient have a HCPOA?    no If yes, name and contact information:  Does patient have a living will  or MOST form?  no  Past Medical History:  Past Medical History:  Diagnosis Date   Arrhythmia    Back pain    Basal cell carcinoma    L lower leg txted in [place] ~ 2016   Depression with anxiety    Hip pain    Hyperlipidemia    Hypertension    Neuromuscular disorder (HCC)    Paroxysmal tachycardia (HCC)     Surgical History:  Past Surgical History:  Procedure Laterality Date   ABDOMINAL HYSTERECTOMY     BREAST BIOPSY     BREAST EXCISIONAL BIOPSY Left 2002   benign   CARDIAC CATHETERIZATION     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   x2   TOTAL HIP ARTHROPLASTY Bilateral    done twice    Medications:  Current Outpatient Medications on File Prior to Visit  Medication Sig   Cholecalciferol (VITAMIN D3) 25 MCG (1000 UT) CAPS Take 1,000 Units by mouth daily.   furosemide (LASIX) 20 MG tablet TAKE 1 TABLET BY MOUTH EVERY OTHER DAY   metoprolol succinate (TOPROL-XL) 25 MG 24 hr tablet Take 1 tablet (25 mg total) by mouth daily.   pravastatin (PRAVACHOL) 40 MG tablet TAKE 1 TABLET BY MOUTH DAILY NEEDS APPT   No current facility-administered medications on file prior to visit.    Allergies:  Allergies  Allergen Reactions   Codeine Nausea Only and Nausea And Vomiting   Simvastatin     Back pain Other reaction(s): back pain    Social History:  Social History   Socioeconomic History   Marital status: Widowed    Spouse name: Not on file   Number of children: Not on file   Years of education: Not on file   Highest education level: Not on file  Occupational History   Not on file  Tobacco Use   Smoking status: Former    Types: Cigarettes   Smokeless tobacco: Never  Vaping Use   Vaping Use: Never used  Substance and Sexual Activity   Alcohol use: Yes    Alcohol/week: 7.0 standard drinks of alcohol    Types: 7 Glasses of wine per week   Drug use: Never   Sexual activity: Not Currently  Other Topics Concern   Not on file  Social History Narrative   Not on file    Social Determinants of Health   Financial Resource Strain: Low Risk  (11/03/2021)   Overall Financial Resource Strain (CARDIA)    Difficulty of Paying Living Expenses: Not hard at all  Food Insecurity: No Food Insecurity (11/03/2021)   Hunger Vital Sign    Worried About Running Out of Food in the Last Year: Never true    Ran Out of Food in  the Last Year: Never true  Transportation Needs: No Transportation Needs (11/03/2021)   PRAPARE - Administrator, Civil Service (Medical): No    Lack of Transportation (Non-Medical): No  Physical Activity: Inactive (11/03/2021)   Exercise Vital Sign    Days of Exercise per Week: 0 days    Minutes of Exercise per Session: 0 min  Stress: No Stress Concern Present (11/03/2021)   [employer] of Occupational Health - Occupational Stress Questionnaire    Feeling of Stress : Not at all  Social Connections: Moderately Integrated (11/03/2021)   Social Connection and Isolation Panel [NHANES]    Frequency of Communication with Friends and Family: More than three times a week    Frequency of Social Gatherings with Friends and Family: Twice a week    Attends Religious Services: More than 4 times per year    Active Member of [employer] or Organizations: Yes    Attends Banker Meetings: More than 4 times per year    Marital Status: Widowed  Intimate Partner Violence: Not At Risk (11/03/2021)   Humiliation, Afraid, Rape, and Kick questionnaire    Fear of Current or Ex-Partner: No    Emotionally Abused: No    Physically Abused: No    Sexually Abused: No   Social History   Tobacco Use  Smoking Status Former   Types: Cigarettes  Smokeless Tobacco Never   Social History   Substance and Sexual Activity  Alcohol Use Yes   Alcohol/week: 7.0 standard drinks of alcohol   Types: 7 Glasses of wine per week    Family History:  Family History  Problem Relation Age of Onset   Hypertension Mother    CAD Mother    Pancreatic cancer Mother     Lung cancer Father    Hypertension Sister    Multiple sclerosis Sister    Breast cancer Neg Hx     Past medical history, surgical history, medications, allergies, family history and social history reviewed with patient today and changes made to appropriate areas of the chart.   Review of Systems  Constitutional:  Positive for diaphoresis. Negative for chills, fever, malaise/fatigue and weight loss.  HENT:  Positive for tinnitus (R ear, gone now). Negative for congestion, ear discharge, ear pain, hearing loss, nosebleeds, sinus pain and sore throat.   Eyes: Negative.   Respiratory: Negative.  Negative for stridor.   Cardiovascular:  Positive for palpitations and leg swelling. Negative for chest pain, orthopnea, claudication and PND.  Gastrointestinal: Negative.   Genitourinary: Negative.   Musculoskeletal: Negative.   Skin: Negative.   Neurological:  Positive for tingling (arms occasionally with sleeping). Negative for dizziness, tremors, sensory change, speech change, focal weakness, seizures, loss of consciousness, weakness and headaches.  Endo/Heme/Allergies:  Positive for environmental allergies. Negative for polydipsia. Bruises/bleeds easily.  Psychiatric/Behavioral: Negative.      All other ROS negative except what is listed above and in the HPI.      Objective:    BP 123/70   Pulse 76   Temp 99 F (37.2 C) (Oral)   Wt 172 lb 9.6 oz (78.3 kg)   SpO2 98%   BMI 30.57 kg/m   Wt Readings from Last 3 Encounters:  11/16/22 172 lb 9.6 oz (78.3 kg)  11/15/22 173 lb (78.5 kg)  09/13/22 175 lb (79.4 kg)   Physical Exam Vitals and nursing note reviewed.  Constitutional:      General: She is not in acute distress.    Appearance:  Normal appearance. She is not ill-appearing, toxic-appearing or diaphoretic.  HENT:     Head: Normocephalic and atraumatic.     Right Ear: Tympanic membrane, ear canal and external ear normal. There is no impacted cerumen.     Left Ear: Tympanic  membrane, ear canal and external ear normal. There is no impacted cerumen.     Nose: Nose normal. No congestion or rhinorrhea.     Mouth/Throat:     Mouth: Mucous membranes are moist.     Pharynx: Oropharynx is clear. No oropharyngeal exudate or posterior oropharyngeal erythema.  Eyes:     General: No scleral icterus.       Right eye: No discharge.        Left eye: No discharge.     Extraocular Movements: Extraocular movements intact.     Conjunctiva/sclera: Conjunctivae normal.     Pupils: Pupils are equal, round, and reactive to light.  Neck:     Vascular: No carotid bruit.  Cardiovascular:     Rate and Rhythm: Normal rate and regular rhythm.     Pulses: Normal pulses.     Heart sounds: No murmur heard.    No friction rub. No gallop.  Pulmonary:     Effort: Pulmonary effort is normal. No respiratory distress.     Breath sounds: Normal breath sounds. No stridor. No wheezing, rhonchi or rales.  Chest:     Chest wall: No tenderness.  Abdominal:     General: Abdomen is flat. Bowel sounds are normal. There is no distension.     Palpations: Abdomen is soft. There is no mass.     Tenderness: There is no abdominal tenderness. There is no right CVA tenderness, left CVA tenderness, guarding or rebound.     Hernia: No hernia is present.  Genitourinary:    Comments: Breast and pelvic exams deferred with shared decision making Musculoskeletal:        General: No swelling, tenderness, deformity or signs of injury.     Cervical back: Normal range of motion and neck supple. No rigidity. No muscular tenderness.     Right lower leg: No edema.     Left lower leg: No edema.  Lymphadenopathy:     Cervical: No cervical adenopathy.  Skin:    General: Skin is warm and dry.     Capillary Refill: Capillary refill takes less than 2 seconds.     Coloration: Skin is not jaundiced or pale.     Findings: No bruising, erythema, lesion or rash.  Neurological:     General: No focal deficit present.      Mental Status: She is alert and oriented to person, place, and time. Mental status is at baseline.     Cranial Nerves: No cranial nerve deficit.     Sensory: No sensory deficit.     Motor: No weakness.     Coordination: Coordination normal.     Gait: Gait normal.     Deep Tendon Reflexes: Reflexes normal.  Psychiatric:        Mood and Affect: Mood normal.        Behavior: Behavior normal.        Thought Content: Thought content normal.        Judgment: Judgment normal.        11/16/2022   11:42 AM 11/03/2021   12:21 PM  6CIT Screen  What Year? 0 points 0 points  What month? 0 points 0 points  What time? 0 points 0 points  Count back from 20 0 points 0 points  Months in reverse 0 points 0 points  Repeat phrase 0 points 0 points  Total Score 0 points 0 points    Results for orders placed or performed in visit on 11/16/22  Urinalysis, Routine w reflex microscopic  Result Value Ref Range   Specific Gravity, UA 1.020 1.005 - 1.030   pH, UA 6.5 5.0 - 7.5   Color, UA Yellow Yellow   Appearance Ur Cloudy (A) Clear   Leukocytes,UA Negative Negative   Protein,UA Negative Negative/Trace   Glucose, UA Negative Negative   Ketones, UA Negative Negative   RBC, UA Negative Negative   Bilirubin, UA Negative Negative   Urobilinogen, Ur 1.0 0.2 - 1.0 mg/dL   Nitrite, UA Negative Negative   Microscopic Examination Comment   Microalbumin, Urine Waived  Result Value Ref Range   Microalb, Ur Waived 30 (H) 0 - 19 mg/L   Creatinine, Urine Waived 100 10 - 300 mg/dL   Microalb/Creat Ratio 30-300 (H) <30 mg/g      Assessment & Plan:   Problem List Items Addressed This Visit       Cardiovascular and Mediastinum   Primary hypertension    Under good control on current regimen. Continue current regimen. Continue to monitor. Call with any concerns. Refills through cardiology. Labs drawn today. .        Relevant Orders   CBC with Differential/Platelet   Comprehensive metabolic panel    Urinalysis, Routine w reflex microscopic (Completed)   TSH   Microalbumin, Urine Waived (Completed)   Senile purpura (HCC)    Reassured patient. CBC drawn today.         Other   Hyperlipidemia    Under good control on current regimen. Continue current regimen. Continue to monitor. Call with any concerns. Refills given.  Labs drawn today.       Relevant Orders   CBC with Differential/Platelet   Comprehensive metabolic panel   Lipid Panel w/o Chol/HDL Ratio   Depression, recurrent (HCC)    Under good control on current regimen. Continue current regimen. Continue to monitor. Call with any concerns. Refills given.        Relevant Medications   buPROPion (WELLBUTRIN XL) 150 MG 24 hr tablet   sertraline (ZOLOFT) 50 MG tablet   traZODone (DESYREL) 50 MG tablet   Other Relevant Orders   CBC with Differential/Platelet   Comprehensive metabolic panel   TSH   Other Visit Diagnoses     Encounter for Medicare annual wellness exam    -  Primary   Preventative care discussed below.        Preventative Services:  Health Risk Assessment and Personalized Prevention Plan: Done today Bone Mass Measurements: up to date Breast Cancer Screening: N/A CVD Screening: Done today Cervical Cancer Screening: N/A Colon Cancer Screening: N/A Depression Screening: Done today Diabetes Screening: Done today Glaucoma Screening: See your eye doctor Hepatitis B vaccine: N/A Hepatitis C screening: up to date HIV Screening: Up to date Flu Vaccine: Get in the fall Lung cancer Screening: N/A Obesity Screening: Up to date Pneumonia Vaccines (2): Up to date STI Screening: N/A  Follow up plan: Return in about 6 months (around 05/18/2023).   LABORATORY TESTING:  - Pap smear: not applicable  IMMUNIZATIONS:   - Tdap: Tetanus vaccination status reviewed: last tetanus booster within 10 years. - Influenza: Postponed to flu season - Pneumovax: Up to date - Prevnar: Up to date - Zostavax vaccine:  given  elsewhere  SCREENING: -Mammogram: Not applicable  - Colonoscopy: Not applicable  - Bone Density: Up to date   PATIENT COUNSELING:   Advised to take 1 mg of folate supplement per day if capable of pregnancy.   Sexuality: Discussed sexually transmitted diseases, partner selection, use of condoms, avoidance of unintended pregnancy  and contraceptive alternatives.   Advised to avoid cigarette smoking.  I discussed with the patient that most people either abstain from alcohol or drink within safe limits (<=14/week and <=4 drinks/occasion for males, <=7/weeks and <= 3 drinks/occasion for females) and that the risk for alcohol disorders and other health effects rises proportionally with the number of drinks per week and how often a drinker exceeds daily limits.  Discussed cessation/primary prevention of drug use and availability of treatment for abuse.   Diet: Encouraged to adjust caloric intake to maintain  or achieve ideal body weight, to reduce intake of dietary saturated fat and total fat, to limit sodium intake by avoiding high sodium foods and not adding table salt, and to maintain adequate dietary potassium and calcium preferably from fresh fruits, vegetables, and low-fat dairy products.    stressed the importance of regular exercise  Injury prevention: Discussed safety belts, safety helmets, smoke detector, smoking near bedding or upholstery.   Dental health: Discussed importance of regular tooth brushing, flossing, and dental visits.    NEXT PREVENTATIVE PHYSICAL DUE IN 1 YEAR. Return in about 6 months (around 05/18/2023).

## 2022-11-16 NOTE — Patient Instructions (Addendum)
Debrox- ear drops   Preventative Services:  Health Risk Assessment and Personalized Prevention Plan: Done today Bone Mass Measurements: up to date Breast Cancer Screening: N/A CVD Screening: Done today Cervical Cancer Screening: N/A Colon Cancer Screening: N/A Depression Screening: Done today Diabetes Screening: Done today Glaucoma Screening: See your eye doctor Hepatitis B vaccine: N/A Hepatitis C screening: up to date HIV Screening: Up to date Flu Vaccine: Get in the fall Lung cancer Screening: N/A Obesity Screening: Up to date Pneumonia Vaccines (2): Up to date STI Screening: N/A

## 2022-11-16 NOTE — Assessment & Plan Note (Signed)
Under good control on current regimen. Continue current regimen. Continue to monitor. Call with any concerns. Refills given.   

## 2022-11-16 NOTE — Assessment & Plan Note (Signed)
Under good control on current regimen. Continue current regimen. Continue to monitor. Call with any concerns. Refills given. Labs drawn today.   

## 2022-11-17 LAB — CBC WITH DIFFERENTIAL/PLATELET
Basophils Absolute: 0 10*3/uL (ref 0.0–0.2)
Basos: 1 %
EOS (ABSOLUTE): 0 10*3/uL (ref 0.0–0.4)
Eos: 1 %
Hematocrit: 38.6 % (ref 34.0–46.6)
Hemoglobin: 12.9 g/dL (ref 11.1–15.9)
Immature Grans (Abs): 0 10*3/uL (ref 0.0–0.1)
Immature Granulocytes: 0 %
Lymphocytes Absolute: 1.3 10*3/uL (ref 0.7–3.1)
Lymphs: 32 %
MCH: 31.5 pg (ref 26.6–33.0)
MCHC: 33.4 g/dL (ref 31.5–35.7)
MCV: 94 fL (ref 79–97)
Monocytes Absolute: 0.2 10*3/uL (ref 0.1–0.9)
Monocytes: 6 %
Neutrophils Absolute: 2.5 10*3/uL (ref 1.4–7.0)
Neutrophils: 60 %
Platelets: 185 10*3/uL (ref 150–450)
RBC: 4.09 x10E6/uL (ref 3.77–5.28)
RDW: 13 % (ref 11.7–15.4)
WBC: 4.1 10*3/uL (ref 3.4–10.8)

## 2022-11-17 LAB — COMPREHENSIVE METABOLIC PANEL
ALT: 22 IU/L (ref 0–32)
AST: 30 IU/L (ref 0–40)
Albumin: 4.5 g/dL (ref 3.8–4.8)
Alkaline Phosphatase: 107 IU/L (ref 44–121)
BUN/Creatinine Ratio: 20 (ref 12–28)
BUN: 18 mg/dL (ref 8–27)
Bilirubin Total: 0.6 mg/dL (ref 0.0–1.2)
CO2: 24 mmol/L (ref 20–29)
Calcium: 9.3 mg/dL (ref 8.7–10.3)
Chloride: 105 mmol/L (ref 96–106)
Creatinine, Ser: 0.91 mg/dL (ref 0.57–1.00)
Globulin, Total: 2.2 g/dL (ref 1.5–4.5)
Glucose: 88 mg/dL (ref 70–99)
Potassium: 4.8 mmol/L (ref 3.5–5.2)
Sodium: 142 mmol/L (ref 134–144)
Total Protein: 6.7 g/dL (ref 6.0–8.5)
eGFR: 64 mL/min/{1.73_m2} (ref >59)

## 2022-11-17 LAB — LIPID PANEL W/O CHOL/HDL RATIO
Cholesterol, Total: 168 mg/dL (ref 100–199)
HDL: 76 mg/dL (ref >39)
LDL Chol Calc (NIH): 80 mg/dL (ref 0–99)
Triglycerides: 60 mg/dL (ref 0–149)
VLDL Cholesterol Cal: 12 mg/dL (ref 5–40)

## 2022-11-17 LAB — TSH: TSH: 1.25 u[IU]/mL (ref 0.450–4.500)

## 2023-02-26 ENCOUNTER — Ambulatory Visit
Admission: EM | Admit: 2023-02-26 | Discharge: 2023-02-26 | Disposition: A | Discharge status: Home / Self Care | Payer: Medicare Other | Attending: Emergency Medicine | Admitting: Emergency Medicine

## 2023-02-26 DIAGNOSIS — J069 Acute upper respiratory infection, unspecified: Secondary | ICD-10-CM

## 2023-02-26 MED ORDER — AZITHROMYCIN 250 MG PO TABS: 250.0000 mg | ORAL_TABLET | Freq: Every day | ORAL | 0 refills | Status: DC

## 2023-02-26 NOTE — ED Triage Notes (Signed)
Patient presents to UC for productive cough x 5 days. States mucous is greenish. Treating symptoms with tylenol and started mucinex yesterday.   Denies fever or SOB.

## 2023-02-26 NOTE — ED Provider Notes (Signed)
Renaldo Fiddler    CSN: [id number] Arrival date & time: 02/26/23  1355      History   Chief Complaint Chief Complaint  Patient presents with   Cough    HPI Mary [name] is a 82 y.o. female.   Patient presents for evaluation of nasal congestion, rhinorrhea and a productive cough with green sputum present for 5 days.  Associated sinus pressure along the forehead and cheeks and intermittent generalized headaches.  Able to manage with Tylenol and Mucinex which has been helpful.  Denies fever, shortness of breath or wheezing.  Tolerating food and liquids.  No known sick contact prior.  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   Hemorrhoid 11/15/2022   CPAP use counseling 11/15/2022   OSA (obstructive sleep apnea) 05/27/2022   Insomnia 12/16/2021   Depression, recurrent (HCC) 12/16/2021   Diastolic heart failure (HCC) 07/30/2021   Hyperlipidemia 07/30/2021   Lymphedema 07/30/2021   Gait instability 06/02/2021   Primary hypertension 06/02/2021   GERD (gastroesophageal reflux disease) 04/10/2017    Past Surgical History:  Procedure Laterality Date   ABDOMINAL HYSTERECTOMY     BREAST BIOPSY     BREAST EXCISIONAL BIOPSY Left 2002   benign   CARDIAC CATHETERIZATION     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   x2   TOTAL HIP ARTHROPLASTY Bilateral    done twice    OB History   No obstetric history on file.      Home Medications    Prior to Admission medications   Medication Sig Start Date End Date Taking? Authorizing Provider  azithromycin (ZITHROMAX) 250 MG tablet Take 1 tablet (250 mg total) by mouth daily. Take first 2 tablets together, then 1 every day  until finished. 02/26/23  Yes Blu Mcglaun R, NP  buPROPion (WELLBUTRIN XL) 150 MG 24 hr tablet Take 1 tablet (150 mg total) by mouth daily. 11/16/22   Olevia Perches P, DO  Cholecalciferol (VITAMIN D3) 25 MCG (1000 UT) CAPS Take 1,000 Units by mouth daily.    [provider]  furosemide (LASIX) 20 MG tablet TAKE 1 TABLET BY MOUTH EVERY OTHER DAY 09/20/22   Antonieta Iba, MD  metoprolol succinate (TOPROL-XL) 25 MG 24 hr tablet Take 1 tablet (25 mg total) by mouth daily. 09/13/22   Furth, Cadence H, PA-C  pravastatin (PRAVACHOL) 40 MG tablet TAKE 1 TABLET BY MOUTH DAILY NEEDS APPT 10/18/22   Antonieta Iba, MD  sertraline (ZOLOFT) 50 MG tablet TAKE 1 TABLET(50 MG) BY MOUTH DAILY 11/16/22   Johnson, Megan P, DO  traZODone (DESYREL) 50 MG tablet TAKE 1/2 TO 1 TABLET(25 TO 50 MG) BY MOUTH AT BEDTIME AS NEEDED FOR SLEEP 11/16/22   Dorcas Carrow, DO    Family History Family History  Problem Relation Age of Onset   Hypertension Mother    CAD Mother    Pancreatic cancer Mother    Lung cancer Father    Hypertension Sister    Multiple sclerosis Sister    Breast cancer Neg Hx     Social History Social  History   Tobacco Use   Smoking status: Former    Types: Cigarettes   Smokeless tobacco: Never  Vaping Use   Vaping status: Never Used  Substance Use Topics   Alcohol use: Yes    Alcohol/week: 7.0 standard drinks of alcohol    Types: 7 Glasses of wine per week   Drug use: Never     Allergies   Codeine and Simvastatin   Review of Systems Review of Systems   Physical Exam Triage Vital Signs ED Triage Vitals [02/26/23 1403]  Encounter Vitals Group     BP 133/80     Systolic BP Percentile      Diastolic BP Percentile      Pulse Rate 77     Resp 16     Temp (!) 96.8 F (36 C)     Temp Source Temporal     SpO2 95 %     Weight      Height      Head Circumference      Peak Flow      Pain Score 0     Pain Loc      Pain Education      Exclude from Growth  Chart    No data found.  Updated Vital Signs BP 133/80 (BP Location: Left Arm)   Pulse 77   Temp (!) 96.8 F (36 C) (Temporal)   Resp 16   SpO2 95%   Visual Acuity Right Eye Distance:   Left Eye Distance:   Bilateral Distance:    Right Eye Near:   Left Eye Near:    Bilateral Near:     Physical Exam Constitutional:      Appearance: Normal appearance.  HENT:     Right Ear: Tympanic membrane, ear canal and external ear normal.     Left Ear: Tympanic membrane, ear canal and external ear normal.     Nose: Congestion and rhinorrhea present.     Mouth/Throat:     Mouth: Mucous membranes are moist.     Pharynx: No posterior oropharyngeal erythema.  Eyes:     Extraocular Movements: Extraocular movements intact.  Cardiovascular:     Rate and Rhythm: Normal rate and regular rhythm.     Pulses: Normal pulses.     Heart sounds: Normal heart sounds.  Pulmonary:     Effort: Pulmonary effort is normal.     Breath sounds: Normal breath sounds.  Musculoskeletal:     Cervical back: Normal range of motion and neck supple.  Skin:    General: Skin is warm and dry.  Neurological:     Mental Status: She is alert and oriented to person, place, and time. Mental status is at baseline.      UC Treatments / Results  Labs (all labs ordered are listed, but only abnormal results are displayed) Labs Reviewed - No data to display  EKG   Radiology No results found.  Procedures Procedures (including critical care time)  Medications Ordered in UC Medications - No data to display  Initial Impression / Assessment and Plan / UC Course  I have reviewed the triage vital signs and the nursing notes.  Pertinent labs & imaging results that were available during my care of the patient were reviewed by me and considered in my medical decision making (see chart for details).  Acute URI  Patient is in no signs of distress nor toxic appearing.  Vital signs are stable.  Low suspicion for  pneumonia, pneumothorax  or bronchitis and therefore will defer imaging.  Prophylactically placed on azithromycin.  Viral testing deferred due to timeline of illness.May use additional over-the-counter medications as needed for supportive care.  May follow-up with urgent care as needed if symptoms persist or worsen.  Final Clinical Impressions(s) / UC Diagnoses   Final diagnoses:  Acute URI     Discharge Instructions      Your symptoms today are most likely being caused by a virus and should steadily improve in time it can take up to 7 to 10 days before you truly start to see a turnaround however things will get better  On exam your lungs are clear and you are getting enough air without assistance, at this time I do not believe that you have pneumonia or bronchitis at this time, green sputum is not an indication of a more serious respiratory condition and it is an indication of a germ and it does not differentiate between viral or bacterial illness  You may begin use of azithromycin as directed prophylactically to prevent symptoms from progressing to more serious illness such as pneumonia    You can take Tylenol and/or Ibuprofen as needed for fever reduction and pain relief.   For cough: honey 1/2 to 1 teaspoon (you can dilute the honey in water or another fluid).  You can also use guaifenesin and dextromethorphan for cough. You can use a humidifier for chest congestion and cough.  If you don't have a humidifier, you can sit in the bathroom with the hot shower running.      For sore throat: try warm salt water gargles, cepacol lozenges, throat spray, warm tea or water with lemon/honey, popsicles or ice, or OTC cold relief medicine for throat discomfort.   For congestion: take a daily anti-histamine like Zyrtec, Claritin, and a oral decongestant, such as pseudoephedrine.  You can also use Flonase 1-2 sprays in each nostril daily.   It is important to stay hydrated: drink plenty of fluids  (water, gatorade/powerade/pedialyte, juices, or teas) to keep your throat moisturized and help further relieve irritation/discomfort.    ED Prescriptions     Medication Sig Dispense Auth. Provider   azithromycin (ZITHROMAX) 250 MG tablet Take 1 tablet (250 mg total) by mouth daily. Take first 2 tablets together, then 1 every day until finished. 6 tablet Valinda Hoar, NP      PDMP not reviewed this encounter.   Valinda Hoar, NP 02/26/23 1512

## 2023-02-26 NOTE — Discharge Instructions (Addendum)
Your symptoms today are most likely being caused by a virus and should steadily improve in time it can take up to 7 to 10 days before you truly start to see a turnaround however things will get better  On exam your lungs are clear and you are getting enough air without assistance, at this time I do not believe that you have pneumonia or bronchitis at this time, green sputum is not an indication of a more serious respiratory condition and it is an indication of a germ and it does not differentiate between viral or bacterial illness  You may begin use of azithromycin as directed prophylactically to prevent symptoms from progressing to more serious illness such as pneumonia    You can take Tylenol and/or Ibuprofen as needed for fever reduction and pain relief.   For cough: honey 1/2 to 1 teaspoon (you can dilute the honey in water or another fluid).  You can also use guaifenesin and dextromethorphan for cough. You can use a humidifier for chest congestion and cough.  If you don't have a humidifier, you can sit in the bathroom with the hot shower running.      For sore throat: try warm salt water gargles, cepacol lozenges, throat spray, warm tea or water with lemon/honey, popsicles or ice, or OTC cold relief medicine for throat discomfort.   For congestion: take a daily anti-histamine like Zyrtec, Claritin, and a oral decongestant, such as pseudoephedrine.  You can also use Flonase 1-2 sprays in each nostril daily.   It is important to stay hydrated: drink plenty of fluids (water, gatorade/powerade/pedialyte, juices, or teas) to keep your throat moisturized and help further relieve irritation/discomfort.

## 2023-03-28 ENCOUNTER — Encounter: Payer: Self-pay | Admitting: Family Medicine

## 2023-04-07 ENCOUNTER — Telehealth: Payer: Self-pay | Admitting: Family Medicine

## 2023-04-07 DIAGNOSIS — G4733 Obstructive sleep apnea (adult) (pediatric): Secondary | ICD-10-CM

## 2023-04-07 NOTE — Telephone Encounter (Signed)
Can we please write Rx for:  Nasal BiPAP at 24/19 cm H20 small ResMed AirTouch F20 mask with humidifier for her OSA- sleep study up front

## 2023-04-08 NOTE — Telephone Encounter (Signed)
Requested information has been faxed back over to [facility] at [phone].

## 2023-04-19 ENCOUNTER — Encounter: Payer: Self-pay | Admitting: Family Medicine

## 2023-04-21 ENCOUNTER — Ambulatory Visit: Payer: Medicare Other | Admitting: Dermatology

## 2023-04-21 DIAGNOSIS — Z1283 Encounter for screening for malignant neoplasm of skin: Secondary | ICD-10-CM

## 2023-04-21 DIAGNOSIS — W908XXA Exposure to other nonionizing radiation, initial encounter: Secondary | ICD-10-CM | POA: Diagnosis not present

## 2023-04-21 DIAGNOSIS — T1490XA Injury, unspecified, initial encounter: Secondary | ICD-10-CM

## 2023-04-21 DIAGNOSIS — Z85828 Personal history of other malignant neoplasm of skin: Secondary | ICD-10-CM

## 2023-04-21 DIAGNOSIS — D229 Melanocytic nevi, unspecified: Secondary | ICD-10-CM

## 2023-04-21 DIAGNOSIS — L814 Other melanin hyperpigmentation: Secondary | ICD-10-CM | POA: Diagnosis not present

## 2023-04-21 DIAGNOSIS — L820 Inflamed seborrheic keratosis: Secondary | ICD-10-CM

## 2023-04-21 DIAGNOSIS — L821 Other seborrheic keratosis: Secondary | ICD-10-CM

## 2023-04-21 DIAGNOSIS — L578 Other skin changes due to chronic exposure to nonionizing radiation: Secondary | ICD-10-CM | POA: Diagnosis not present

## 2023-04-21 DIAGNOSIS — Z7189 Other specified counseling: Secondary | ICD-10-CM

## 2023-04-21 DIAGNOSIS — Z79899 Other long term (current) drug therapy: Secondary | ICD-10-CM

## 2023-04-21 DIAGNOSIS — S8992XA Unspecified injury of left lower leg, initial encounter: Secondary | ICD-10-CM

## 2023-04-21 DIAGNOSIS — D1801 Hemangioma of skin and subcutaneous tissue: Secondary | ICD-10-CM

## 2023-04-21 MED ORDER — MUPIROCIN 2 % EX OINT: 1.0000 | TOPICAL_OINTMENT | Freq: Every day | CUTANEOUS | 0 refills | Status: DC

## 2023-04-21 NOTE — Patient Instructions (Addendum)

## 2023-04-21 NOTE — Progress Notes (Signed)
Follow-Up Visit   Subjective  Mary [name] is a 82 y.o. female who presents for the following: Skin Cancer Screening and Full Body Skin Exam, hx of BCC, check injury L lower leg, 3wks, pt ran into something and dog scratched  The patient presents for Total-Body Skin Exam (TBSE) for skin cancer screening and mole check. The patient has spots, moles and lesions to be evaluated, some may be new or changing and the patient may have concern these could be cancer.    The following portions of the chart were reviewed this encounter and updated as appropriate: medications, allergies, medical history  Review of Systems:  No other skin or systemic complaints except as noted in HPI or Assessment and Plan.  Objective  Well appearing patient in no apparent distress; mood and affect are within normal limits.  A full examination was performed including scalp, head, eyes, ears, nose, lips, neck, chest, axillae, abdomen, back, buttocks, bilateral upper extremities, bilateral lower extremities, hands, feet, fingers, toes, fingernails, and toenails. All findings within normal limits unless otherwise noted below.   Relevant physical exam findings are noted in the Assessment and Plan.  L preauricular x 1, R preauricular x 1 (2) Stuck on waxy paps with erythema    Assessment & Plan   SKIN CANCER SCREENING PERFORMED TODAY.  ACTINIC DAMAGE - Chronic condition, secondary to cumulative UV/sun exposure - diffuse scaly erythematous macules with underlying dyspigmentation - Recommend daily broad spectrum sunscreen SPF 30+ to sun-exposed areas, reapply every 2 hours as needed.  - Staying in the shade or wearing long sleeves, sun glasses (UVA+UVB protection) and wide brim hats (4-inch brim around the entire circumference of the hat) are also recommended for sun protection.  - Call for new or changing lesions.  LENTIGINES, SEBORRHEIC KERATOSES, HEMANGIOMAS - Benign normal skin lesions - Benign-appearing -  Call for any changes  MELANOCYTIC NEVI - Tan-brown and/or pink-flesh-colored symmetric macules and papules - Benign appearing on exam today - Observation - Call clinic for new or changing moles - Recommend daily use of broad spectrum spf 30+ sunscreen to sun-exposed areas.   HISTORY OF BASAL CELL CARCINOMA OF THE SKIN - No evidence of recurrence today - Recommend regular full body skin exams - Recommend daily broad spectrum sunscreen SPF 30+ to sun-exposed areas, reapply every 2 hours as needed.  - Call if any new or changing lesions are noted between office visits  - L lower leg  TRAUMATIC INJURY  No evidence of infection L lower leg Exam: crust with surrounding purpura  Treatment Plan: Start Mupirocin oint qd to wound and cover with band aid  Inflamed seborrheic keratosis (2) L preauricular x 1, R preauricular x 1  Symptomatic, irritating, patient would like treated.  Destruction of lesion - L preauricular x 1, R preauricular x 1 (2) Complexity: simple   Destruction method: cryotherapy   Informed consent: discussed and consent obtained   Timeout:  patient name, date of birth, surgical site, and procedure verified Lesion destroyed using liquid nitrogen: Yes   Region frozen until ice ball extended beyond lesion: Yes   Outcome: patient tolerated procedure well with no complications   Post-procedure details: wound care instructions given     Return in about 1 year (around 04/20/2024) for TBSE, Hx of BCC.  I, Ardis Rowan, RMA, am acting as scribe for Armida Sans, MD .   Documentation: I have reviewed the above documentation for accuracy and completeness, and I agree with the above.  Armida Sans,  MD

## 2023-04-26 ENCOUNTER — Encounter: Payer: Self-pay | Admitting: Dermatology

## 2023-05-12 ENCOUNTER — Other Ambulatory Visit: Payer: Self-pay | Admitting: Family Medicine

## 2023-05-12 NOTE — Telephone Encounter (Signed)
Requested Prescriptions  Pending Prescriptions Disp Refills   sertraline (ZOLOFT) 50 MG tablet [Pharmacy Med Name: SERTRALINE 50MG  TABLETS] 90 tablet 1    Sig: TAKE 1 TABLET(50 MG) BY MOUTH DAILY     Psychiatry:  Antidepressants - SSRI - sertraline Passed - 05/12/2023 11:09 AM      Passed - AST in normal range and within 360 days    AST  Date Value Ref Range Status  11/16/2022 30 0 - 40 IU/L Final         Passed - ALT in normal range and within 360 days    ALT  Date Value Ref Range Status  11/16/2022 22 0 - 32 IU/L Final         Passed - Completed PHQ-2 or PHQ-9 in the last 360 days      Passed - Valid encounter within last 6 months    Recent Outpatient Visits           5 months ago Encounter for [employer] annual wellness exam   [place] [facility] [place], Megan P, DO   10 months ago Appointment canceled by hospital   [place] [facility], Megan P, DO   10 months ago Depression, recurrent [facility])   [place] [facility] [place], [place], DO   1 year ago Snoring   [place] [facility] [place], [place], DO   1 year ago Depression, recurrent [facility])   [place] [facility] [place], Oralia Rud, DO       Future Appointments             In 1 week Dorcas Carrow, DO [facility]   In 11 months Deirdre Evener, MD [facility]

## 2023-05-19 ENCOUNTER — Ambulatory Visit
Admission: RE | Admit: 2023-05-19 | Discharge: 2023-05-19 | Disposition: A | Discharge status: Home / Self Care | Payer: Medicare Other | Source: Ambulatory Visit | Attending: Family Medicine | Admitting: Family Medicine

## 2023-05-19 ENCOUNTER — Encounter: Payer: Self-pay | Admitting: Family Medicine

## 2023-05-19 ENCOUNTER — Ambulatory Visit (INDEPENDENT_AMBULATORY_CARE_PROVIDER_SITE_OTHER): Payer: Medicare Other | Admitting: Family Medicine

## 2023-05-19 ENCOUNTER — Telehealth: Payer: Self-pay | Admitting: *Deleted

## 2023-05-19 VITALS — BP 115/67 | HR 62 | Wt 170.0 lb

## 2023-05-19 DIAGNOSIS — E785 Hyperlipidemia, unspecified: Secondary | ICD-10-CM | POA: Diagnosis not present

## 2023-05-19 DIAGNOSIS — F339 Major depressive disorder, recurrent, unspecified: Secondary | ICD-10-CM | POA: Diagnosis not present

## 2023-05-19 DIAGNOSIS — R600 Localized edema: Secondary | ICD-10-CM | POA: Diagnosis present

## 2023-05-19 DIAGNOSIS — R252 Cramp and spasm: Secondary | ICD-10-CM

## 2023-05-19 DIAGNOSIS — I10 Essential (primary) hypertension: Secondary | ICD-10-CM

## 2023-05-19 MED ORDER — METOPROLOL SUCCINATE ER 25 MG PO TB24: 25.0000 mg | ORAL_TABLET | Freq: Every day | ORAL | 1 refills | Status: DC

## 2023-05-19 MED ORDER — FUROSEMIDE 20 MG PO TABS: 20.0000 mg | ORAL_TABLET | ORAL | 1 refills | Status: DC

## 2023-05-19 MED ORDER — TRAZODONE HCL 50 MG PO TABS: ORAL_TABLET | ORAL | 1 refills | Status: DC

## 2023-05-19 MED ORDER — SERTRALINE HCL 50 MG PO TABS: ORAL_TABLET | ORAL | 0 refills | Status: DC

## 2023-05-19 MED ORDER — PRAVASTATIN SODIUM 40 MG PO TABS: 40.0000 mg | ORAL_TABLET | Freq: Every day | ORAL | 1 refills | Status: DC

## 2023-05-19 MED ORDER — BUPROPION HCL ER (XL) 150 MG PO TB24: 150.0000 mg | ORAL_TABLET | Freq: Every day | ORAL | 1 refills | Status: DC

## 2023-05-19 NOTE — Assessment & Plan Note (Signed)
Under good control on current regimen. Continue current regimen. Continue to monitor. Call with any concerns. Refills given. Labs drawn today.   

## 2023-05-19 NOTE — Patient Instructions (Signed)
[place] Scheduled at [facility] [place] at 3pm  [address], [place], [place] [address]

## 2023-05-19 NOTE — Telephone Encounter (Signed)
  Chief Complaint: results Symptoms: NA Frequency: NA Pertinent Negatives: Patient denies NA Disposition: [] ED /[] Urgent Care (no appt availability in office) / [] Appointment(In office/virtual)/ []  [facility]/ [] Home Care/ [] Refused Recommended Disposition /[] [facility]/ []  Follow-up with PCP Additional Notes:   'PArker' with [facility] calling to report negative for DVT. States noted to hold pt at facility but pt left as she lives in [place] and wanted to get home. States has cell phone if needed.  CAlled practice, Heather, to alert.

## 2023-05-19 NOTE — Progress Notes (Signed)
BP 115/67   Pulse 62   Wt 170 lb (77.1 kg)   SpO2 99%   BMI 30.11 kg/m    Subjective:    Patient ID: Mary [name], female    DOB: 02-19-1941, 82 y.o.   MRN: [id number]  HPI: Mary [name] is a 82 y.o. female  Chief Complaint  Patient presents with   Hypertension   Hyperlipidemia   Depression   Leg Pain    Patient says she notices some nights, she will have pain in her legs. Patient says the pain and discomfort comes and goes. Patient says she will take an Tylenol at night to help. Patient says she takes a squirt of Mustard to help as well.    LEG CRAMPS Duration: off and on for over a year Pain: no Severity: mild  Quality:  cramping and sore Location:  calves Bilateral:   bilateral Onset: sudden Frequency: intermittent Time of  day:   night time Sudden unintentional leg jerking:   no Paresthesias:   no Decreased sensation:  no Weakness:   no Insomnia:   no Fatigue:   no Alleviating factors: mustard Aggravating factors: night time Status: worse  HYPERTENSION / HYPERLIPIDEMIA Satisfied with current treatment? yes Duration of hypertension: chronic BP monitoring frequency: not checking BP medication side effects: no Past BP meds: lasix, metoprolol Duration of hyperlipidemia: chronic Cholesterol medication side effects: no Cholesterol supplements: none Past cholesterol medications: pravastatin Medication compliance: excellent compliance Aspirin: no Recent stressors: no Recurrent headaches: no Visual changes: no Palpitations: no Dyspnea: no Chest pain: no Lower extremity edema: yes Dizzy/lightheaded: no  DEPRESSION Mood status: controlled Satisfied with current treatment?: yes Symptom severity: mild  Duration of current treatment : years Side effects: no Medication compliance: excellent compliance Psychotherapy/counseling: no  Previous psychiatric medications: wellbutrin, sertraline Depressed mood: no Anxious mood: no Anhedonia: no Significant weight  loss or gain: no Insomnia: no  Fatigue: yes Feelings of worthlessness or guilt: no Impaired concentration/indecisiveness: no Suicidal ideations: no Hopelessness: no Crying spells: no    05/19/2023   11:26 AM 11/16/2022   11:14 AM 07/15/2022   10:01 AM 01/15/2022   11:02 AM 12/16/2021   11:41 AM  Depression screen PHQ 2/9  Decreased Interest 0 0 0 0 0  Down, Depressed, Hopeless 1 0 0 0 1  PHQ - 2 Score 1 0 0 0 1  Altered sleeping 0 0 1 1 1   Tired, decreased energy 0 0 0 0 0  Change in appetite 0 0 0 0 0  Feeling bad or failure about yourself  0 0 0 0 1  Trouble concentrating 0 0 0 0 0  Moving slowly or fidgety/restless 0 1 0 0 0  Suicidal thoughts 0 0 0 0 0  PHQ-9 Score 1 1 1 1 3   Difficult doing work/chores Not difficult at all  Not difficult at all Not difficult at all     Relevant past medical, surgical, family and social history reviewed and updated as indicated. Interim medical history since our last visit reviewed. Allergies and medications reviewed and updated.  Review of Systems  Constitutional: Negative.   Respiratory: Negative.    Cardiovascular:  Positive for leg swelling. Negative for chest pain and palpitations.  Gastrointestinal: Negative.   Musculoskeletal: Negative.   Neurological: Negative.   Psychiatric/Behavioral: Negative.      Per HPI unless specifically indicated above     Objective:    BP 115/67   Pulse 62   Wt 170 lb (77.1 kg)  SpO2 99%   BMI 30.11 kg/m   Wt Readings from Last 3 Encounters:  05/19/23 170 lb (77.1 kg)  11/16/22 172 lb 9.6 oz (78.3 kg)  11/15/22 173 lb (78.5 kg)    Physical Exam Vitals and nursing note reviewed.  Constitutional:      General: She is not in acute distress.    Appearance: Normal appearance. She is not ill-appearing, toxic-appearing or diaphoretic.  HENT:     Head: Normocephalic and atraumatic.     Right Ear: External ear normal.     Left Ear: External ear normal.     Nose: Nose normal.      Mouth/Throat:     Mouth: Mucous membranes are moist.     Pharynx: Oropharynx is clear.  Eyes:     General: No scleral icterus.       Right eye: No discharge.        Left eye: No discharge.     Extraocular Movements: Extraocular movements intact.     Conjunctiva/sclera: Conjunctivae normal.     Pupils: Pupils are equal, round, and reactive to light.  Cardiovascular:     Rate and Rhythm: Normal rate and regular rhythm.     Pulses: Normal pulses.     Heart sounds: Normal heart sounds. No murmur heard.    No friction rub. No gallop.  Pulmonary:     Effort: Pulmonary effort is normal. No respiratory distress.     Breath sounds: Normal breath sounds. No stridor. No wheezing, rhonchi or rales.  Chest:     Chest wall: No tenderness.  Musculoskeletal:        General: Normal range of motion.     Cervical back: Normal range of motion and neck supple.     Right lower leg: Edema (1+) present.     Left lower leg: Edema (2+, + Squeeze, negative Homan's) present.  Skin:    General: Skin is warm and dry.     Capillary Refill: Capillary refill takes less than 2 seconds.     Coloration: Skin is not jaundiced or pale.     Findings: No bruising, erythema, lesion or rash.  Neurological:     General: No focal deficit present.     Mental Status: She is alert and oriented to person, place, and time. Mental status is at baseline.  Psychiatric:        Mood and Affect: Mood normal.        Behavior: Behavior normal.        Thought Content: Thought content normal.        Judgment: Judgment normal.     Results for orders placed or performed in visit on 11/16/22  Urinalysis, Routine w reflex microscopic   Collection Time: 11/16/22 11:16 AM  Result Value Ref Range   Specific Gravity, UA 1.020 1.005 - 1.030   pH, UA 6.5 5.0 - 7.5   Color, UA Yellow Yellow   Appearance Ur Cloudy (A) Clear   Leukocytes,UA Negative Negative   Protein,UA Negative Negative/Trace   Glucose, UA Negative Negative    Ketones, UA Negative Negative   RBC, UA Negative Negative   Bilirubin, UA Negative Negative   Urobilinogen, Ur 1.0 0.2 - 1.0 mg/dL   Nitrite, UA Negative Negative   Microscopic Examination Comment   Microalbumin, Urine Waived   Collection Time: 11/16/22 11:16 AM  Result Value Ref Range   Microalb, Ur Waived 30 (H) 0 - 19 mg/L   Creatinine, Urine Waived 100 10 - 300  mg/dL   Microalb/Creat Ratio 30-300 (H) <30 mg/g  CBC with Differential/Platelet   Collection Time: 11/16/22 11:18 AM  Result Value Ref Range   WBC 4.1 3.4 - 10.8 x10E3/uL   RBC 4.09 3.77 - 5.28 x10E6/uL   Hemoglobin 12.9 11.1 - 15.9 g/dL   Hematocrit 40.9 81.1 - 46.6 %   MCV 94 79 - 97 fL   MCH 31.5 26.6 - 33.0 pg   MCHC 33.4 31.5 - 35.7 g/dL   RDW 91.4 78.2 - 95.6 %   Platelets 185 150 - 450 x10E3/uL   Neutrophils 60 Not Estab. %   Lymphs 32 Not Estab. %   Monocytes 6 Not Estab. %   Eos 1 Not Estab. %   Basos 1 Not Estab. %   Neutrophils Absolute 2.5 1.4 - 7.0 x10E3/uL   Lymphocytes Absolute 1.3 0.7 - 3.1 x10E3/uL   Monocytes Absolute 0.2 0.1 - 0.9 x10E3/uL   EOS (ABSOLUTE) 0.0 0.0 - 0.4 x10E3/uL   Basophils Absolute 0.0 0.0 - 0.2 x10E3/uL   Immature Granulocytes 0 Not Estab. %   Immature Grans (Abs) 0.0 0.0 - 0.1 x10E3/uL  Comprehensive metabolic panel   Collection Time: 11/16/22 11:18 AM  Result Value Ref Range   Glucose 88 70 - 99 mg/dL   BUN 18 8 - 27 mg/dL   Creatinine, Ser 2.13 0.57 - 1.00 mg/dL   eGFR 64 >08 MV/HQI/6.96   BUN/Creatinine Ratio 20 12 - 28   Sodium 142 134 - 144 mmol/L   Potassium 4.8 3.5 - 5.2 mmol/L   Chloride 105 96 - 106 mmol/L   CO2 24 20 - 29 mmol/L   Calcium 9.3 8.7 - 10.3 mg/dL   Total Protein 6.7 6.0 - 8.5 g/dL   Albumin 4.5 3.8 - 4.8 g/dL   Globulin, Total 2.2 1.5 - 4.5 g/dL   Bilirubin Total 0.6 0.0 - 1.2 mg/dL   Alkaline Phosphatase 107 44 - 121 IU/L   AST 30 0 - 40 IU/L   ALT 22 0 - 32 IU/L  Lipid Panel w/o Chol/HDL Ratio   Collection Time: 11/16/22 11:18 AM   Result Value Ref Range   Cholesterol, Total 168 100 - 199 mg/dL   Triglycerides 60 0 - 149 mg/dL   HDL 76 >29 mg/dL   VLDL Cholesterol Cal 12 5 - 40 mg/dL   LDL Chol Calc (NIH) 80 0 - 99 mg/dL  TSH   Collection Time: 11/16/22 11:18 AM  Result Value Ref Range   TSH 1.250 0.450 - 4.500 uIU/mL      Assessment & Plan:   Problem List Items Addressed This Visit       Cardiovascular and Mediastinum   Primary hypertension   Under good control on current regimen. Continue current regimen. Continue to monitor. Call with any concerns. Refills given. Labs drawn today.       Relevant Medications   furosemide (LASIX) 20 MG tablet   metoprolol succinate (TOPROL-XL) 25 MG 24 hr tablet   pravastatin (PRAVACHOL) 40 MG tablet   Other Relevant Orders   Comprehensive metabolic panel     Other   Hyperlipidemia - Primary   Under good control on current regimen. Continue current regimen. Continue to monitor. Call with any concerns. Refills given. Labs drawn today.       Relevant Medications   furosemide (LASIX) 20 MG tablet   metoprolol succinate (TOPROL-XL) 25 MG 24 hr tablet   pravastatin (PRAVACHOL) 40 MG tablet   Other Relevant Orders  Lipid Panel w/o Chol/HDL Ratio   Depression, recurrent (HCC)   Under good control on current regimen. Continue current regimen. Continue to monitor. Call with any concerns. Refills given. Labs drawn today.        Relevant Medications   buPROPion (WELLBUTRIN XL) 150 MG 24 hr tablet   sertraline (ZOLOFT) 50 MG tablet   traZODone (DESYREL) 50 MG tablet   Other Visit Diagnoses       Leg edema       Will check [place] to r/o DVT. Await results.   Relevant Orders   US Venous Img Lower Unilateral Left     Leg cramps       Will check labs today. Await results. Treat as needed.   Relevant Orders   Magnesium   Phosphorus   CBC with Differential/Platelet   Comprehensive metabolic panel   US Venous Img Lower Unilateral Left        Follow up  plan: Return in about 6 months (around 11/17/2023) for AWV and follow up with me.

## 2023-05-20 LAB — CBC WITH DIFFERENTIAL/PLATELET
Basophils Absolute: 0 10*3/uL (ref 0.0–0.2)
Basos: 1 %
EOS (ABSOLUTE): 0.1 10*3/uL (ref 0.0–0.4)
Eos: 2 %
Hematocrit: 39.9 % (ref 34.0–46.6)
Hemoglobin: 13.1 g/dL (ref 11.1–15.9)
Immature Grans (Abs): 0 10*3/uL (ref 0.0–0.1)
Immature Granulocytes: 0 %
Lymphocytes Absolute: 0.7 10*3/uL (ref 0.7–3.1)
Lymphs: 22 %
MCH: 31.6 pg (ref 26.6–33.0)
MCHC: 32.8 g/dL (ref 31.5–35.7)
MCV: 96 fL (ref 79–97)
Monocytes Absolute: 0.4 10*3/uL (ref 0.1–0.9)
Monocytes: 12 %
Neutrophils Absolute: 2.2 10*3/uL (ref 1.4–7.0)
Neutrophils: 63 %
Platelets: 174 10*3/uL (ref 150–450)
RBC: 4.15 x10E6/uL (ref 3.77–5.28)
RDW: 13.1 % (ref 11.7–15.4)
WBC: 3.4 10*3/uL (ref 3.4–10.8)

## 2023-05-20 LAB — COMPREHENSIVE METABOLIC PANEL
ALT: 18 [IU]/L (ref 0–32)
AST: 24 [IU]/L (ref 0–40)
Albumin: 4.5 g/dL (ref 3.8–4.8)
Alkaline Phosphatase: 104 [IU]/L (ref 44–121)
BUN/Creatinine Ratio: 17 (ref 12–28)
BUN: 14 mg/dL (ref 8–27)
Bilirubin Total: 0.6 mg/dL (ref 0.0–1.2)
CO2: 22 mmol/L (ref 20–29)
Calcium: 9.5 mg/dL (ref 8.7–10.3)
Chloride: 106 mmol/L (ref 96–106)
Creatinine, Ser: 0.84 mg/dL (ref 0.57–1.00)
Globulin, Total: 2.2 g/dL (ref 1.5–4.5)
Glucose: 94 mg/dL (ref 70–99)
Potassium: 4.9 mmol/L (ref 3.5–5.2)
Sodium: 143 mmol/L (ref 134–144)
Total Protein: 6.7 g/dL (ref 6.0–8.5)
eGFR: 70 mL/min/{1.73_m2} (ref >59)

## 2023-05-20 LAB — LIPID PANEL W/O CHOL/HDL RATIO
Cholesterol, Total: 154 mg/dL (ref 100–199)
HDL: 75 mg/dL (ref >39)
LDL Chol Calc (NIH): 66 mg/dL (ref 0–99)
Triglycerides: 67 mg/dL (ref 0–149)
VLDL Cholesterol Cal: 13 mg/dL (ref 5–40)

## 2023-05-20 LAB — PHOSPHORUS: Phosphorus: 3.3 mg/dL (ref 3.0–4.3)

## 2023-05-20 LAB — MAGNESIUM: Magnesium: 2.2 mg/dL (ref 1.6–2.3)

## 2023-06-22 ENCOUNTER — Telehealth: Payer: Self-pay | Admitting: Family Medicine

## 2023-06-22 NOTE — Telephone Encounter (Signed)
CPAP Order , to be faxed to [facility] [phone]. Placing form in providers folder.

## 2023-06-23 NOTE — Telephone Encounter (Signed)
Order placed via [facility]. Sent to Dr. Henriette Combs email to sign off on.

## 2023-06-23 NOTE — Telephone Encounter (Signed)
All set, but I don't think she likes using [facility]- I think she wanted to change to [place]

## 2023-07-06 NOTE — Telephone Encounter (Signed)
 Mary [name] with [facility] called because he received an order for the patients bipap and he states the patient told him she got set up with a competitor in November and that she wanted to go with that company. He wants to make sure this is correct. I do see a mention of [employer] in the providers notes. Please assist further

## 2023-07-06 NOTE — Telephone Encounter (Signed)
 Nothing else needs to be done if patient is happy with her current provider.

## 2023-07-07 ENCOUNTER — Other Ambulatory Visit: Payer: Self-pay | Admitting: Cardiovascular Disease

## 2023-07-12 ENCOUNTER — Encounter: Payer: Self-pay | Admitting: Family Medicine

## 2023-07-12 ENCOUNTER — Ambulatory Visit: Payer: Medicare Other | Admitting: Family Medicine

## 2023-07-12 VITALS — BP 128/57 | HR 77 | Temp 98.6°F | Resp 15 | Ht 62.99 in | Wt 169.8 lb

## 2023-07-12 DIAGNOSIS — N764 Abscess of vulva: Secondary | ICD-10-CM | POA: Diagnosis not present

## 2023-07-12 MED ORDER — SULFAMETHOXAZOLE-TRIMETHOPRIM 800-160 MG PO TABS
1.0000 | ORAL_TABLET | Freq: Two times a day (BID) | ORAL | 0 refills | Status: AC
Start: 2023-07-12 — End: ?

## 2023-07-12 NOTE — Progress Notes (Signed)
BP (!) 128/57 (BP Location: Left Arm, Patient Position: Sitting, Cuff Size: Normal)   Pulse 77   Temp 98.6 F (37 C) (Oral)   Resp 15   Ht 5' 2.99" (1.6 m)   Wt 169 lb 12.8 oz (77 kg)   SpO2 97%   BMI 30.09 kg/m    Subjective:    Patient ID: Mary [name], female    DOB: 10-10-1940, 83 y.o.   MRN: [id number]  HPI: Mary [name] is a 83 y.o. female  Chief Complaint  Patient presents with   Recurrent Skin Infections    Has never had one. Near groin   SKIN INFECTION Duration: about a week Location: groin History of trauma in area: no Pain: yes Quality: irritated Severity: moderate Redness: yes Swelling: yes Oozing: no Pus: no Fevers: no Nausea/vomiting: no Status: worse Treatments attempted:warm compresses  Tetanus: UTD  Relevant past medical, surgical, family and social history reviewed and updated as indicated. Interim medical history since our last visit reviewed. Allergies and medications reviewed and updated.  Review of Systems  Constitutional: Negative.   Respiratory: Negative.    Cardiovascular: Negative.   Gastrointestinal: Negative.   Musculoskeletal: Negative.   Skin:  Positive for color change. Negative for pallor, rash and wound.  Psychiatric/Behavioral: Negative.      Per HPI unless specifically indicated above     Objective:    BP (!) 128/57 (BP Location: Left Arm, Patient Position: Sitting, Cuff Size: Normal)   Pulse 77   Temp 98.6 F (37 C) (Oral)   Resp 15   Ht 5' 2.99" (1.6 m)   Wt 169 lb 12.8 oz (77 kg)   SpO2 97%   BMI 30.09 kg/m   Wt Readings from Last 3 Encounters:  07/12/23 169 lb 12.8 oz (77 kg)  05/19/23 170 lb (77.1 kg)  11/16/22 172 lb 9.6 oz (78.3 kg)    Physical Exam Vitals and nursing note reviewed.  Constitutional:      General: She is not in acute distress.    Appearance: Normal appearance. She is well-developed.  HENT:     Head: Normocephalic and atraumatic.     Right Ear: Hearing and external ear normal.      Left Ear: Hearing and external ear normal.     Nose: Nose normal.     Mouth/Throat:     Mouth: Mucous membranes are moist.     Pharynx: Oropharynx is clear.  Eyes:     General: Lids are normal. No scleral icterus.       Right eye: No discharge.        Left eye: No discharge.     Conjunctiva/sclera: Conjunctivae normal.  Pulmonary:     Effort: Pulmonary effort is normal. No respiratory distress.  Genitourinary:      Comments: Red, swollen, tender, no fluctuance Musculoskeletal:        General: Normal range of motion.  Skin:    Coloration: Skin is not jaundiced or pale.     Findings: No bruising, erythema, lesion or rash.  Neurological:     General: No focal deficit present.     Mental Status: She is alert and oriented to person, place, and time. Mental status is at baseline.  Psychiatric:        Mood and Affect: Mood normal.        Speech: Speech normal.        Behavior: Behavior normal.        Thought Content: Thought content normal.  Judgment: Judgment normal.     Results for orders placed or performed in visit on 05/19/23  Magnesium   Collection Time: 05/19/23 11:39 AM  Result Value Ref Range   Magnesium 2.2 1.6 - 2.3 mg/dL  Phosphorus   Collection Time: 05/19/23 11:39 AM  Result Value Ref Range   Phosphorus 3.3 3.0 - 4.3 mg/dL  CBC with Differential/Platelet   Collection Time: 05/19/23 11:39 AM  Result Value Ref Range   WBC 3.4 3.4 - 10.8 x10E3/uL   RBC 4.15 3.77 - 5.28 x10E6/uL   Hemoglobin 13.1 11.1 - 15.9 g/dL   Hematocrit 16.1 09.6 - 46.6 %   MCV 96 79 - 97 fL   MCH 31.6 26.6 - 33.0 pg   MCHC 32.8 31.5 - 35.7 g/dL   RDW 04.5 40.9 - 81.1 %   Platelets 174 150 - 450 x10E3/uL   Neutrophils 63 Not Estab. %   Lymphs 22 Not Estab. %   Monocytes 12 Not Estab. %   Eos 2 Not Estab. %   Basos 1 Not Estab. %   Neutrophils Absolute 2.2 1.4 - 7.0 x10E3/uL   Lymphocytes Absolute 0.7 0.7 - 3.1 x10E3/uL   Monocytes Absolute 0.4 0.1 - 0.9 x10E3/uL   EOS  (ABSOLUTE) 0.1 0.0 - 0.4 x10E3/uL   Basophils Absolute 0.0 0.0 - 0.2 x10E3/uL   Immature Granulocytes 0 Not Estab. %   Immature Grans (Abs) 0.0 0.0 - 0.1 x10E3/uL  Comprehensive metabolic panel   Collection Time: 05/19/23 11:39 AM  Result Value Ref Range   Glucose 94 70 - 99 mg/dL   BUN 14 8 - 27 mg/dL   Creatinine, Ser 9.14 0.57 - 1.00 mg/dL   eGFR 70 >78 GN/FAO/1.30   BUN/Creatinine Ratio 17 12 - 28   Sodium 143 134 - 144 mmol/L   Potassium 4.9 3.5 - 5.2 mmol/L   Chloride 106 96 - 106 mmol/L   CO2 22 20 - 29 mmol/L   Calcium 9.5 8.7 - 10.3 mg/dL   Total Protein 6.7 6.0 - 8.5 g/dL   Albumin 4.5 3.8 - 4.8 g/dL   Globulin, Total 2.2 1.5 - 4.5 g/dL   Bilirubin Total 0.6 0.0 - 1.2 mg/dL   Alkaline Phosphatase 104 44 - 121 IU/L   AST 24 0 - 40 IU/L   ALT 18 0 - 32 IU/L  Lipid Panel w/o Chol/HDL Ratio   Collection Time: 05/19/23 11:39 AM  Result Value Ref Range   Cholesterol, Total 154 100 - 199 mg/dL   Triglycerides 67 0 - 149 mg/dL   HDL 75 >86 mg/dL   VLDL Cholesterol Cal 13 5 - 40 mg/dL   LDL Chol Calc (NIH) 66 0 - 99 mg/dL      Assessment & Plan:   Problem List Items Addressed This Visit   None Visit Diagnoses       Labial abscess    -  Primary   Not fluctuant. Will start bactrim and recheck on Friday to make sure it doesn't need to be I&D'd. Call with any concerns.        Follow up plan: Return in about 3 days (around 07/15/2023).

## 2023-07-15 ENCOUNTER — Encounter: Payer: Self-pay | Admitting: Family Medicine

## 2023-07-15 ENCOUNTER — Ambulatory Visit: Payer: Medicare Other | Admitting: Family Medicine

## 2023-07-15 VITALS — BP 121/67 | HR 74 | Ht 64.0 in | Wt 169.0 lb

## 2023-07-15 DIAGNOSIS — N764 Abscess of vulva: Secondary | ICD-10-CM

## 2023-07-15 NOTE — Progress Notes (Signed)
BP 121/67 (BP Location: Left Arm, Patient Position: Sitting, Cuff Size: Normal)   Pulse 74   Ht 5\' 4"  (1.626 m)   Wt 169 lb (76.7 kg)   SpO2 98%   BMI 29.01 kg/m    Subjective:    Patient ID: Mary [name], female    DOB: 06/02/1940, 83 y.o.   MRN: [id number]  HPI: Mary [name] is a 83 y.o. female  Chief Complaint  Patient presents with   Follow-up    Labial abscess--have pink drainage, denied pain.   Tolerating her medicine well. Labial abscess is significantly better. No concerns.   Relevant past medical, surgical, family and social history reviewed and updated as indicated. Interim medical history since our last visit reviewed. Allergies and medications reviewed and updated.  Review of Systems  Constitutional: Negative.   Respiratory: Negative.    Cardiovascular: Negative.   Musculoskeletal: Negative.   Skin: Negative.   Psychiatric/Behavioral: Negative.      Per HPI unless specifically indicated above     Objective:    BP 121/67 (BP Location: Left Arm, Patient Position: Sitting, Cuff Size: Normal)   Pulse 74   Ht 5\' 4"  (1.626 m)   Wt 169 lb (76.7 kg)   SpO2 98%   BMI 29.01 kg/m   Wt Readings from Last 3 Encounters:  07/15/23 169 lb (76.7 kg)  07/12/23 169 lb 12.8 oz (77 kg)  05/19/23 170 lb (77.1 kg)    Physical Exam Vitals and nursing note reviewed.  Constitutional:      General: She is not in acute distress.    Appearance: Normal appearance. She is well-developed.  HENT:     Head: Normocephalic and atraumatic.     Right Ear: Hearing and external ear normal.     Left Ear: Hearing and external ear normal.     Nose: Nose normal.     Mouth/Throat:     Mouth: Mucous membranes are moist.     Pharynx: Oropharynx is clear.  Eyes:     General: Lids are normal. No scleral icterus.       Right eye: No discharge.        Left eye: No discharge.     Conjunctiva/sclera: Conjunctivae normal.  Pulmonary:     Effort: Pulmonary effort is normal. No respiratory  distress.  Genitourinary:    Comments: Significantly better with only mild swelling to L labia Musculoskeletal:        General: Normal range of motion.  Skin:    Coloration: Skin is not jaundiced or pale.     Findings: No bruising, erythema, lesion or rash.  Neurological:     Mental Status: She is alert. Mental status is at baseline. She is disoriented.  Psychiatric:        Mood and Affect: Mood normal.        Speech: Speech normal.        Behavior: Behavior normal.        Thought Content: Thought content normal.        Judgment: Judgment normal.     Results for orders placed or performed in visit on 05/19/23  Magnesium   Collection Time: 05/19/23 11:39 AM  Result Value Ref Range   Magnesium 2.2 1.6 - 2.3 mg/dL  Phosphorus   Collection Time: 05/19/23 11:39 AM  Result Value Ref Range   Phosphorus 3.3 3.0 - 4.3 mg/dL  CBC with Differential/Platelet   Collection Time: 05/19/23 11:39 AM  Result Value Ref Range   WBC  3.4 3.4 - 10.8 x10E3/uL   RBC 4.15 3.77 - 5.28 x10E6/uL   Hemoglobin 13.1 11.1 - 15.9 g/dL   Hematocrit 16.1 09.6 - 46.6 %   MCV 96 79 - 97 fL   MCH 31.6 26.6 - 33.0 pg   MCHC 32.8 31.5 - 35.7 g/dL   RDW 04.5 40.9 - 81.1 %   Platelets 174 150 - 450 x10E3/uL   Neutrophils 63 Not Estab. %   Lymphs 22 Not Estab. %   Monocytes 12 Not Estab. %   Eos 2 Not Estab. %   Basos 1 Not Estab. %   Neutrophils Absolute 2.2 1.4 - 7.0 x10E3/uL   Lymphocytes Absolute 0.7 0.7 - 3.1 x10E3/uL   Monocytes Absolute 0.4 0.1 - 0.9 x10E3/uL   EOS (ABSOLUTE) 0.1 0.0 - 0.4 x10E3/uL   Basophils Absolute 0.0 0.0 - 0.2 x10E3/uL   Immature Granulocytes 0 Not Estab. %   Immature Grans (Abs) 0.0 0.0 - 0.1 x10E3/uL  Comprehensive metabolic panel   Collection Time: 05/19/23 11:39 AM  Result Value Ref Range   Glucose 94 70 - 99 mg/dL   BUN 14 8 - 27 mg/dL   Creatinine, Ser 9.14 0.57 - 1.00 mg/dL   eGFR 70 >78 GN/FAO/1.30   BUN/Creatinine Ratio 17 12 - 28   Sodium 143 134 - 144 mmol/L    Potassium 4.9 3.5 - 5.2 mmol/L   Chloride 106 96 - 106 mmol/L   CO2 22 20 - 29 mmol/L   Calcium 9.5 8.7 - 10.3 mg/dL   Total Protein 6.7 6.0 - 8.5 g/dL   Albumin 4.5 3.8 - 4.8 g/dL   Globulin, Total 2.2 1.5 - 4.5 g/dL   Bilirubin Total 0.6 0.0 - 1.2 mg/dL   Alkaline Phosphatase 104 44 - 121 IU/L   AST 24 0 - 40 IU/L   ALT 18 0 - 32 IU/L  Lipid Panel w/o Chol/HDL Ratio   Collection Time: 05/19/23 11:39 AM  Result Value Ref Range   Cholesterol, Total 154 100 - 199 mg/dL   Triglycerides 67 0 - 149 mg/dL   HDL 75 >86 mg/dL   VLDL Cholesterol Cal 13 5 - 40 mg/dL   LDL Chol Calc (NIH) 66 0 - 99 mg/dL      Assessment & Plan:   Problem List Items Addressed This Visit   None Visit Diagnoses       Labial abscess    -  Primary   Resolving. 1/3 the size. Will finish antibiotics. Call with any concerns.        Follow up plan: Return if symptoms worsen or fail to improve.

## 2023-08-01 ENCOUNTER — Ambulatory Visit: Payer: Medicare Other | Admitting: Internal Medicine

## 2023-08-01 NOTE — Progress Notes (Unsigned)
 No show

## 2023-09-15 ENCOUNTER — Other Ambulatory Visit: Payer: Self-pay | Admitting: Family Medicine

## 2023-09-15 DIAGNOSIS — R928 Other abnormal and inconclusive findings on diagnostic imaging of breast: Secondary | ICD-10-CM

## 2023-09-15 DIAGNOSIS — Z1231 Encounter for screening mammogram for malignant neoplasm of breast: Secondary | ICD-10-CM

## 2023-10-07 ENCOUNTER — Other Ambulatory Visit: Payer: Self-pay

## 2023-10-07 MED ORDER — METOPROLOL SUCCINATE ER 25 MG PO TB24: 25.0000 mg | ORAL_TABLET | Freq: Every day | ORAL | 1 refills | Status: DC

## 2023-10-09 IMAGING — MG DIGITAL DIAGNOSTIC BILAT W/ TOMO W/ CAD
7 of 11 series · 7 of 27 positions shown · non-contrast
Comparison: Previous exam(s).

CLINICAL DATA: Recall from screening mammography for BILATERAL
findings. Possible asymmetry in the central RIGHT breast visible
only on the MLO view, and 2 separate groups of calcifications in the
RIGHT breast; possible focal asymmetry in the central LEFT breast.



[R ML (1 of 2)]
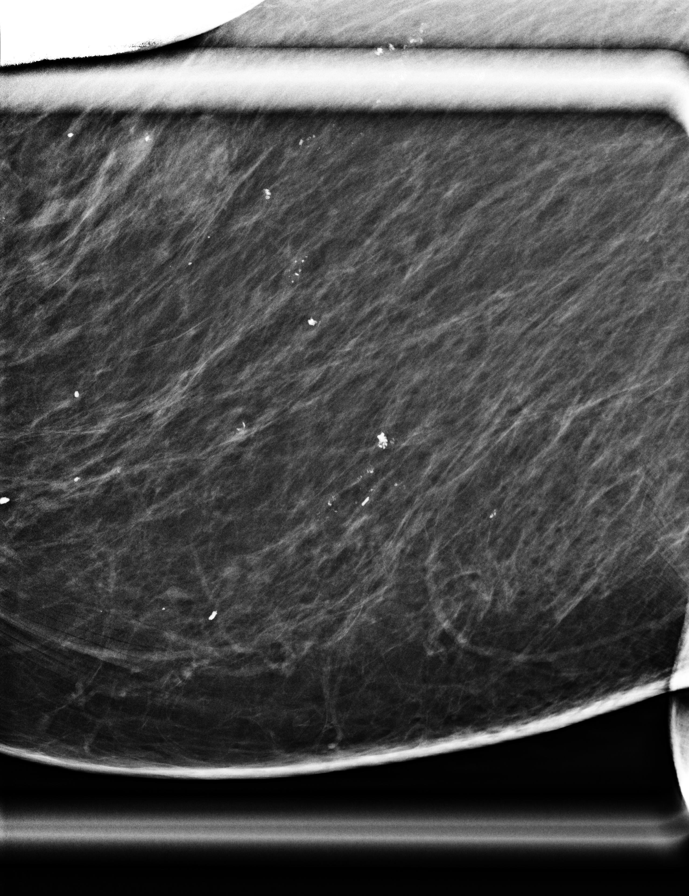

[R CC]
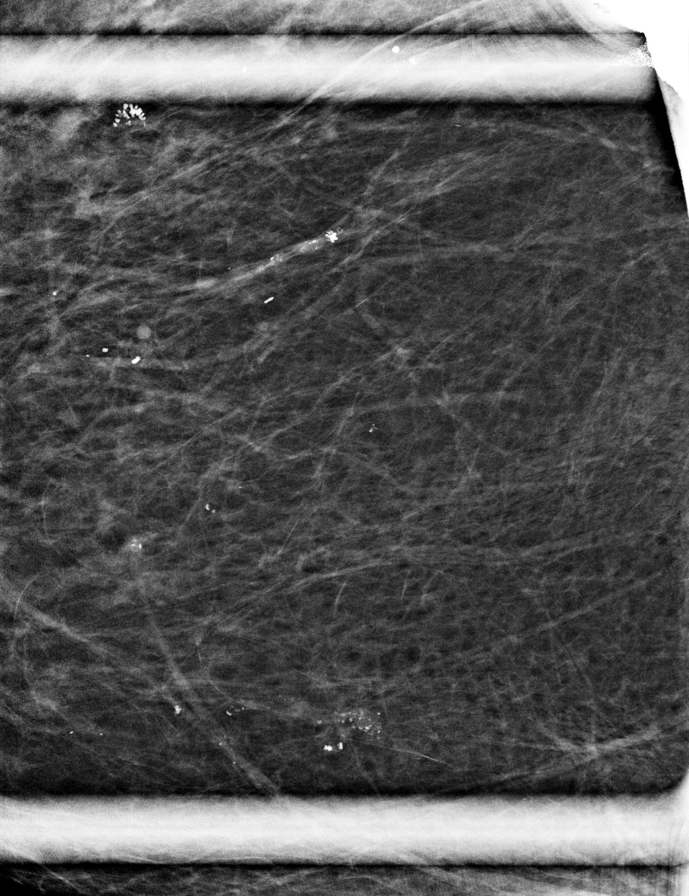

[R ML (2 of 2)]
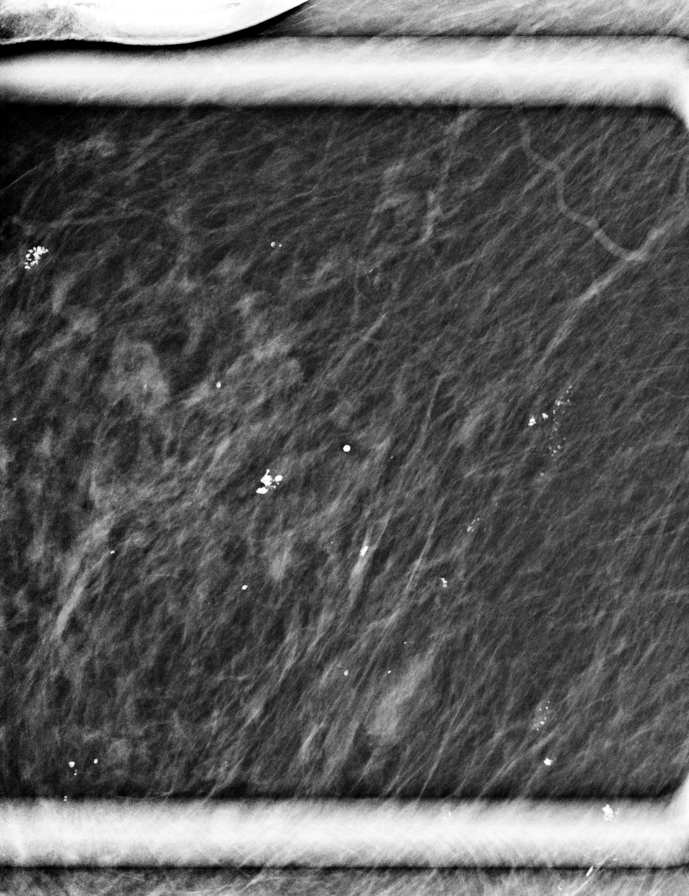

[R ML synth-2D]
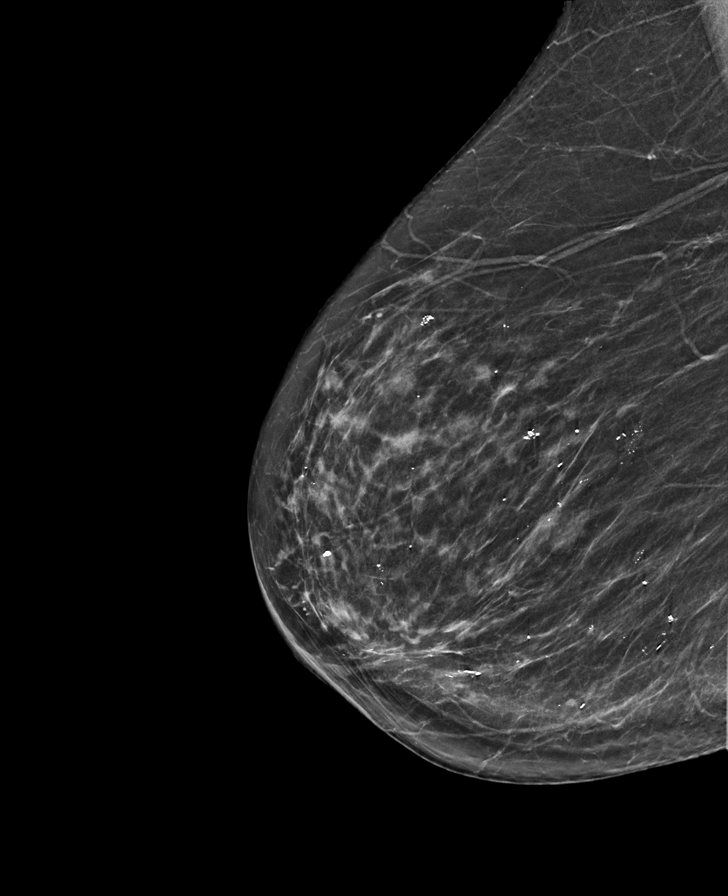

[L CC synth-2D]
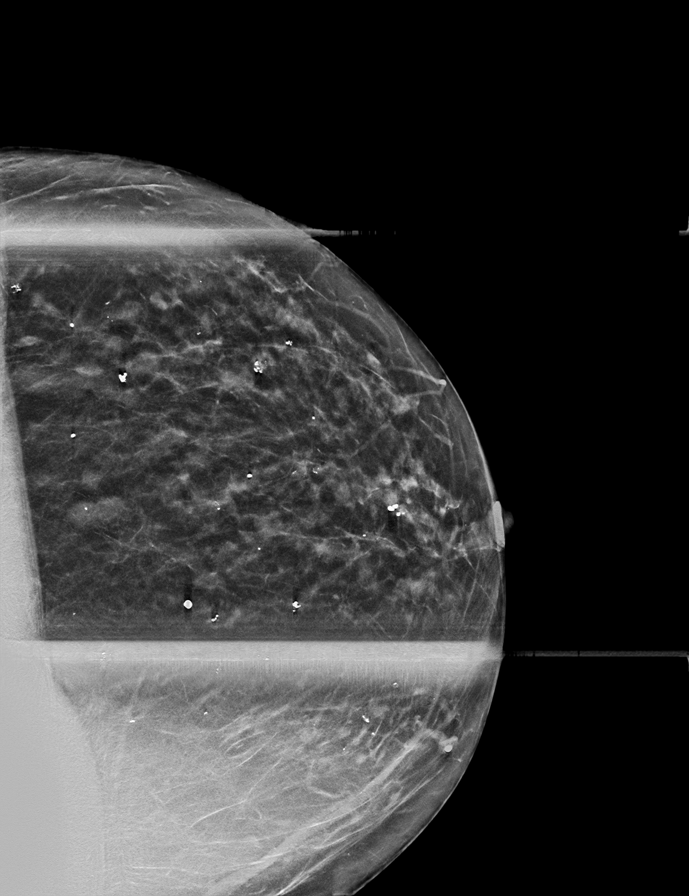

[R MLO synth-2D]
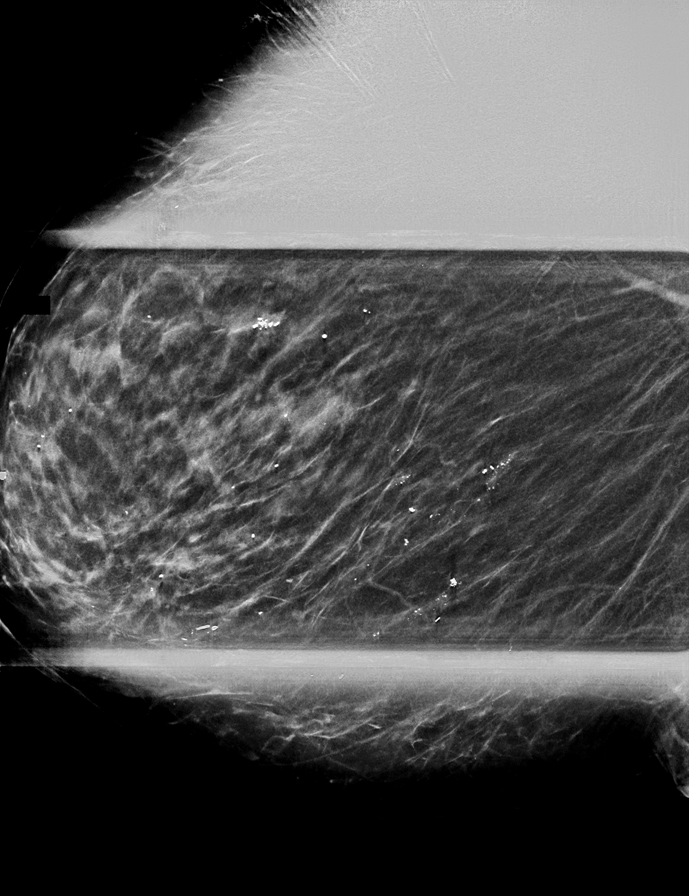

[L MLO synth-2D]
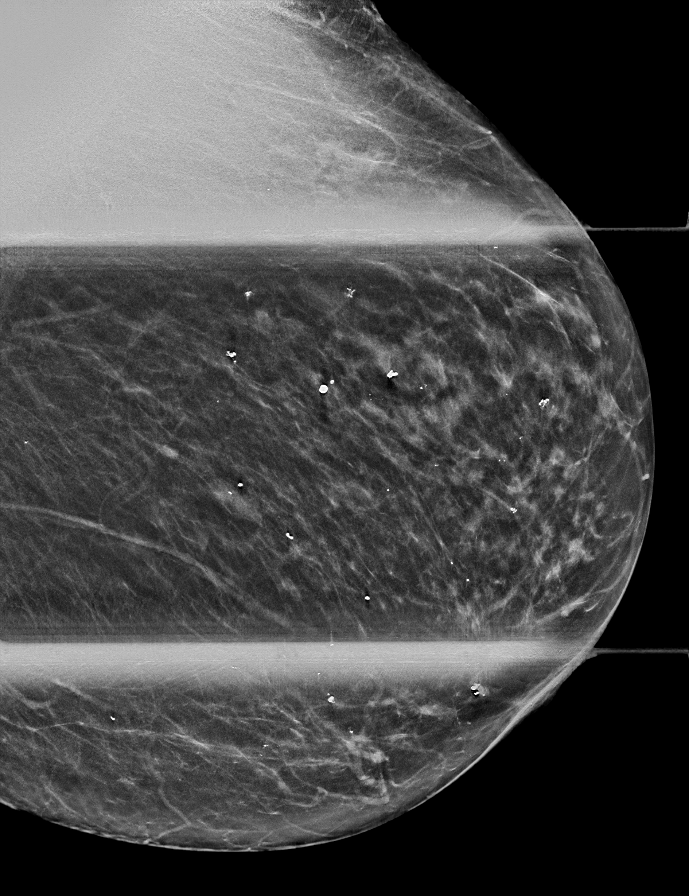

[7 of 27 positions shown; findings below may reference images not displayed]

ACR Breast Density Category c: The breast tissue is heterogeneously
dense, which may obscure small masses.
FINDINGS: Spot-compression MLO view of the area of concern in the RIGHT
breast, a full field mediolateral view of the RIGHT breast, and spot
magnification views of the RIGHT breast calcifications were
obtained. Spot compression CC and MLO views of the area of concern
in the LEFT breast were obtained.

RIGHT: The asymmetry in the central breast persists on the spot
compression view but is partially compressible, measuring
approximately 1 cm in size. It was likely present on prior
mammograms but was more conspicuous on the recent screening
mammogram. There is no associated architectural distortion or
suspicious calcifications. The asymmetry is more inferiorly located
on the full field mediolateral view relative to its location on the
MLO view, indicating a location in the outer breast, therefore
likely near 9 o'clock.

Spot magnification views demonstrate a group of punctate and
dystrophic appearing calcifications in the inner breast at middle to
posterior depth spanning approximately 2.5 cm, designated group 1 on
the images. There is a second group of similar punctate and
dystrophic appearing calcifications in the lower breast at middle
depth spanning approximately 1.8 cm. There are numerous groups of
benign fibroadenomatoid type calcifications and benign coarse linear
secretory calcifications scattered throughout the remainder of the
breast.

Targeted ultrasound is performed, demonstrating an island of
fibroglandular tissue likely associated with fibrocystic changes at
the 10 o'clock position 8 cm from nipple at middle to posterior
depth which is the likely sonographic correlate for the mammographic
asymmetry. No cyst, solid mass or abnormal acoustic shadowing is
identified in the outer central breast.

LEFT: The asymmetry in the slight upper central breast questioned on
screening mammography persists but is partially compressible,
measuring approximate 1.5 cm in size. It was likely present on prior
mammograms but was more conspicuous on the recent screening
mammogram. There is no associated architectural distortion or
suspicious calcifications.

Targeted ultrasound is performed, demonstrating an island of
fibroglandular tissue with a similar morphology of the mammographic
asymmetry at the 12 o'clock position 4 cm from the nipple at middle
depth. No cyst, solid mass or abnormal acoustic shadowing is
identified in the upper breast.
IMPRESSION: 1. Two groups of likely benign fibroadenomatoid calcifications
involving the RIGHT breast, a group in the inner breast at middle to
posterior depth spanning 2.5 cm and a group in the lower breast at
middle depth spanning 1.8 cm.
2. The asymmetries in both breasts likely represent islands of
fibroglandular tissue.

RECOMMENDATION:
Diagnostic RIGHT mammogram in 6 months to include spot magnification
views of the calcifications.

I have discussed the findings and recommendations with the patient.
If applicable, a reminder letter will be sent to the patient
regarding the next appointment.

BI-RADS CATEGORY  3: Probably benign.

## 2023-10-10 ENCOUNTER — Encounter (HOSPITAL_COMMUNITY): Payer: Self-pay

## 2023-10-13 ENCOUNTER — Other Ambulatory Visit: Payer: Self-pay | Admitting: Family Medicine

## 2023-10-14 NOTE — Telephone Encounter (Signed)
 Too soon for refill, refilled 07/07/23 for 90 and RF.  Requested Prescriptions  Pending Prescriptions Disp Refills   pravastatin  (PRAVACHOL ) 40 MG tablet [Pharmacy Med Name: PRAVASTATIN  40MG  TABLETS] 90 tablet 1    Sig: TAKE 1 TABLET(40 MG) BY MOUTH DAILY     Cardiovascular:  Antilipid - Statins Failed - 10/14/2023  1:37 PM      Failed - Lipid Panel in normal range within the last 12 months    Cholesterol, Total  Date Value Ref Range Status  05/19/2023 154 100 - 199 mg/dL Final   LDL Chol Calc (NIH)  Date Value Ref Range Status  05/19/2023 66 0 - 99 mg/dL Final   HDL  Date Value Ref Range Status  05/19/2023 75 >39 mg/dL Final   Triglycerides  Date Value Ref Range Status  05/19/2023 67 0 - 149 mg/dL Final         Passed - Patient is not pregnant      Passed - Valid encounter within last 12 months    Recent Outpatient Visits           3 months ago Labial abscess   [facility] [place], Megan P, DO   3 months ago Labial abscess   [place] [facility] [place], Jerilee Montane, DO       Future Appointments             In 1 month Lincoln Renshaw, Jerilee Montane, DO [facility]   In 6 months Elta Halter, MD [facility]

## 2023-10-17 ENCOUNTER — Ambulatory Visit
Admission: RE | Admit: 2023-10-17 | Discharge: 2023-10-17 | Disposition: A | Discharge status: Home / Self Care | Source: Ambulatory Visit | Attending: Family Medicine | Admitting: Family Medicine

## 2023-10-17 DIAGNOSIS — R928 Other abnormal and inconclusive findings on diagnostic imaging of breast: Secondary | ICD-10-CM | POA: Diagnosis present

## 2023-10-20 ENCOUNTER — Ambulatory Visit: Payer: Self-pay | Admitting: Family Medicine

## 2023-10-23 ENCOUNTER — Other Ambulatory Visit: Payer: Self-pay | Admitting: Family Medicine

## 2023-10-26 NOTE — Telephone Encounter (Signed)
 Requested Prescriptions  Pending Prescriptions Disp Refills   buPROPion  (WELLBUTRIN  XL) 150 MG 24 hr tablet [Pharmacy Med Name: BUPROPION  XL 150MG  TABLETS (24 H)] 90 tablet 1    Sig: TAKE 1 TABLET(150 MG) BY MOUTH DAILY     Psychiatry: Antidepressants - bupropion  Passed - 10/26/2023  3:15 PM      Passed - Cr in normal range and within 360 days    Creatinine, Ser  Date Value Ref Range Status  05/19/2023 0.84 0.57 - 1.00 mg/dL Final         Passed - AST in normal range and within 360 days    AST  Date Value Ref Range Status  05/19/2023 24 0 - 40 IU/L Final         Passed - ALT in normal range and within 360 days    ALT  Date Value Ref Range Status  05/19/2023 18 0 - 32 IU/L Final         Passed - Completed PHQ-2 or PHQ-9 in the last 360 days      Passed - Last BP in normal range    BP Readings from Last 1 Encounters:  07/15/23 121/67         Passed - Valid encounter within last 6 months    Recent Outpatient Visits           3 months ago Labial abscess   [place] [facility] [place], Megan P, DO   3 months ago Labial abscess   [place] [facility] Solomon Dupre, DO       Future Appointments             In 2 weeks Solomon Dupre, DO [facility]   In 5 months Elta Halter, MD [facility]

## 2023-11-14 ENCOUNTER — Ambulatory Visit (INDEPENDENT_AMBULATORY_CARE_PROVIDER_SITE_OTHER): Payer: Medicare Other | Admitting: Family Medicine

## 2023-11-14 ENCOUNTER — Encounter: Payer: Self-pay | Admitting: Family Medicine

## 2023-11-14 VITALS — BP 133/73 | HR 62 | Ht 64.0 in | Wt 166.2 lb

## 2023-11-14 DIAGNOSIS — F339 Major depressive disorder, recurrent, unspecified: Secondary | ICD-10-CM | POA: Diagnosis not present

## 2023-11-14 DIAGNOSIS — D692 Other nonthrombocytopenic purpura: Secondary | ICD-10-CM

## 2023-11-14 DIAGNOSIS — I10 Essential (primary) hypertension: Secondary | ICD-10-CM | POA: Diagnosis not present

## 2023-11-14 DIAGNOSIS — F5102 Adjustment insomnia: Secondary | ICD-10-CM | POA: Diagnosis not present

## 2023-11-14 DIAGNOSIS — E785 Hyperlipidemia, unspecified: Secondary | ICD-10-CM

## 2023-11-14 DIAGNOSIS — I5030 Unspecified diastolic (congestive) heart failure: Secondary | ICD-10-CM

## 2023-11-14 DIAGNOSIS — N3946 Mixed incontinence: Secondary | ICD-10-CM | POA: Insufficient documentation

## 2023-11-14 LAB — MICROALBUMIN, URINE WAIVED
Creatinine, Urine Waived: 200 mg/dL (ref 10–300)
Microalb, Ur Waived: 30 mg/L — ABNORMAL HIGH (ref 0–19)
Microalb/Creat Ratio: <30 mg/g (ref <30)

## 2023-11-14 MED ORDER — BUPROPION HCL ER (XL) 150 MG PO TB24: 150.0000 mg | ORAL_TABLET | Freq: Every day | ORAL | 1 refills | Status: DC

## 2023-11-14 MED ORDER — FUROSEMIDE 20 MG PO TABS: 20.0000 mg | ORAL_TABLET | ORAL | 1 refills | Status: DC

## 2023-11-14 MED ORDER — METOPROLOL SUCCINATE ER 25 MG PO TB24: 25.0000 mg | ORAL_TABLET | Freq: Every day | ORAL | 1 refills | Status: DC

## 2023-11-14 MED ORDER — PRAVASTATIN SODIUM 40 MG PO TABS: 40.0000 mg | ORAL_TABLET | Freq: Every day | ORAL | 1 refills | Status: DC

## 2023-11-14 MED ORDER — TRAZODONE HCL 50 MG PO TABS: ORAL_TABLET | ORAL | 1 refills | Status: DC

## 2023-11-14 MED ORDER — SERTRALINE HCL 50 MG PO TABS: ORAL_TABLET | ORAL | 1 refills | Status: DC

## 2023-11-14 NOTE — Patient Instructions (Signed)
 Dr. Gollan [id number], [facility] [address] [place], [place], [place] [address] Phone: [phone]

## 2023-11-14 NOTE — Assessment & Plan Note (Signed)
 Reassured patient. Continue to monitor.

## 2023-11-14 NOTE — Assessment & Plan Note (Signed)
 Under good control on current regimen. Continue current regimen. Continue to monitor. Call with any concerns. Refills given. Labs drawn today.

## 2023-11-14 NOTE — Assessment & Plan Note (Signed)
 Encouraged follow up with cardiology. Euvolemic today.

## 2023-11-14 NOTE — Progress Notes (Signed)
 BP 133/73 (BP Location: Left Arm, Patient Position: Sitting, Cuff Size: Normal)   Pulse 62   Ht 5' 4 (1.626 m)   Wt 166 lb 3.2 oz (75.4 kg)   SpO2 96%   BMI 28.53 kg/m    Subjective:    Patient ID: Mary [name], female    DOB: 02-Mar-1941, 83 y.o.   MRN: [id number]  HPI: Mary [name] is a 83 y.o. female  Chief Complaint  Patient presents with   Hypertension   Hyperlipidemia   Depression   HYPERTENSION / HYPERLIPIDEMIA Satisfied with current treatment? yes Duration of hypertension: chronic BP monitoring frequency: not checking BP medication side effects: no Past BP meds: lasix , metoprolol  Duration of hyperlipidemia: chronic Cholesterol medication side effects: no Cholesterol supplements: none Past cholesterol medications: pravastatin  Medication compliance: excellent compliance Aspirin: no Recent stressors: no Recurrent headaches: no Visual changes: no Palpitations: no Dyspnea: no Chest pain: no Lower extremity edema: no Dizzy/lightheaded: no  DEPRESSION Mood status: controlled Satisfied with current treatment?: yes Symptom severity: mild  Duration of current treatment : chronic Side effects: no Medication compliance: excellent compliance Psychotherapy/counseling: no  Previous psychiatric medications: sertraline  Depressed mood: no Anxious mood: no Anhedonia: no Significant weight loss or gain: no Insomnia: no  Fatigue: no Feelings of worthlessness or guilt: no Impaired concentration/indecisiveness: no Suicidal ideations: no Hopelessness: no Crying spells: no    11/14/2023   10:41 AM 07/12/2023    2:28 PM 05/19/2023   11:26 AM 11/16/2022   11:14 AM 07/15/2022   10:01 AM  Depression screen PHQ 2/9  Decreased Interest 0 0 0 0 0  Down, Depressed, Hopeless 0 0 1 0 0  PHQ - 2 Score 0 0 1 0 0  Altered sleeping 0 0 0 0 1  Tired, decreased energy 0 0 0 0 0  Change in appetite 0 0 0 0 0  Feeling bad or failure about yourself  0 0 0 0 0  Trouble  concentrating 1 0 0 0 0  Moving slowly or fidgety/restless 0 0 0 1 0  Suicidal thoughts 0 0 0 0 0  PHQ-9 Score 1 0 1 1 1   Difficult doing work/chores Not difficult at all Not difficult at all Not difficult at all  Not difficult at all    Relevant past medical, surgical, family and social history reviewed and updated as indicated. Interim medical history since our last visit reviewed. Allergies and medications reviewed and updated.  Review of Systems  Constitutional: Negative.   HENT: Negative.    Eyes: Negative.   Respiratory:  Positive for wheezing. Negative for apnea, cough, choking, chest tightness, shortness of breath and stridor.   Cardiovascular:  Positive for palpitations (occasionally) and leg swelling. Negative for chest pain.  Gastrointestinal: Negative.   Endocrine: Negative.   Genitourinary:  Positive for urgency. Negative for decreased urine volume, difficulty urinating, dyspareunia, dysuria, enuresis, flank pain, frequency, genital sores, hematuria, menstrual problem, pelvic pain, vaginal bleeding, vaginal discharge and vaginal pain.  Musculoskeletal: Negative.   Skin: Negative.   Allergic/Immunologic: Positive for environmental allergies. Negative for food allergies and immunocompromised state.  Neurological:  Positive for light-headedness (momentary with bending over occsionally). Negative for dizziness, tremors, seizures, syncope, facial asymmetry, speech difficulty, weakness, numbness and headaches.  Hematological: Negative.   Psychiatric/Behavioral: Negative.      Per HPI unless specifically indicated above     Objective:    BP 133/73 (BP Location: Left Arm, Patient Position: Sitting, Cuff Size: Normal)   Pulse 62  Ht 5' 4 (1.626 m)   Wt 166 lb 3.2 oz (75.4 kg)   SpO2 96%   BMI 28.53 kg/m   Wt Readings from Last 3 Encounters:  11/14/23 166 lb 3.2 oz (75.4 kg)  07/15/23 169 lb (76.7 kg)  07/12/23 169 lb 12.8 oz (77 kg)    Physical Exam Vitals and  nursing note reviewed.  Constitutional:      General: She is not in acute distress.    Appearance: Normal appearance. She is not ill-appearing, toxic-appearing or diaphoretic.  HENT:     Head: Normocephalic and atraumatic.     Right Ear: Tympanic membrane, ear canal and external ear normal. There is no impacted cerumen.     Left Ear: Tympanic membrane, ear canal and external ear normal. There is no impacted cerumen.     Nose: Nose normal. No congestion or rhinorrhea.     Mouth/Throat:     Mouth: Mucous membranes are moist.     Pharynx: Oropharynx is clear. No oropharyngeal exudate or posterior oropharyngeal erythema.   Eyes:     General: No scleral icterus.       Right eye: No discharge.        Left eye: No discharge.     Extraocular Movements: Extraocular movements intact.     Conjunctiva/sclera: Conjunctivae normal.     Pupils: Pupils are equal, round, and reactive to light.   Neck:     Vascular: No carotid bruit.   Cardiovascular:     Rate and Rhythm: Normal rate and regular rhythm.     Pulses: Normal pulses.     Heart sounds: No murmur heard.    No friction rub. No gallop.  Pulmonary:     Effort: Pulmonary effort is normal. No respiratory distress.     Breath sounds: Normal breath sounds. No stridor. No wheezing, rhonchi or rales.  Chest:     Chest wall: No tenderness.  Abdominal:     General: Abdomen is flat. Bowel sounds are normal. There is no distension.     Palpations: Abdomen is soft. There is no mass.     Tenderness: There is no abdominal tenderness. There is no right CVA tenderness, left CVA tenderness, guarding or rebound.     Hernia: No hernia is present.  Genitourinary:    Comments: Breast and pelvic exams deferred with shared decision making  Musculoskeletal:        General: No swelling, tenderness, deformity or signs of injury.     Cervical back: Normal range of motion and neck supple. No rigidity. No muscular tenderness.     Right lower leg: No edema.      Left lower leg: No edema.  Lymphadenopathy:     Cervical: No cervical adenopathy.   Skin:    General: Skin is warm and dry.     Capillary Refill: Capillary refill takes less than 2 seconds.     Coloration: Skin is not jaundiced or pale.     Findings: No bruising, erythema, lesion or rash.   Neurological:     General: No focal deficit present.     Mental Status: She is alert and oriented to person, place, and time. Mental status is at baseline.     Cranial Nerves: No cranial nerve deficit.     Sensory: No sensory deficit.     Motor: No weakness.     Coordination: Coordination normal.     Gait: Gait normal.     Deep Tendon Reflexes: Reflexes normal.  Psychiatric:        Mood and Affect: Mood normal.        Behavior: Behavior normal.        Thought Content: Thought content normal.        Judgment: Judgment normal.     Results for orders placed or performed in visit on 05/19/23  Magnesium   Collection Time: 05/19/23 11:39 AM  Result Value Ref Range   Magnesium 2.2 1.6 - 2.3 mg/dL  Phosphorus   Collection Time: 05/19/23 11:39 AM  Result Value Ref Range   Phosphorus 3.3 3.0 - 4.3 mg/dL  CBC with Differential/Platelet   Collection Time: 05/19/23 11:39 AM  Result Value Ref Range   WBC 3.4 3.4 - 10.8 x10E3/uL   RBC 4.15 3.77 - 5.28 x10E6/uL   Hemoglobin 13.1 11.1 - 15.9 g/dL   Hematocrit 24.4 01.0 - 46.6 %   MCV 96 79 - 97 fL   MCH 31.6 26.6 - 33.0 pg   MCHC 32.8 31.5 - 35.7 g/dL   RDW 27.2 53.6 - 64.4 %   Platelets 174 150 - 450 x10E3/uL   Neutrophils 63 Not Estab. %   Lymphs 22 Not Estab. %   Monocytes 12 Not Estab. %   Eos 2 Not Estab. %   Basos 1 Not Estab. %   Neutrophils Absolute 2.2 1.4 - 7.0 x10E3/uL   Lymphocytes Absolute 0.7 0.7 - 3.1 x10E3/uL   Monocytes Absolute 0.4 0.1 - 0.9 x10E3/uL   EOS (ABSOLUTE) 0.1 0.0 - 0.4 x10E3/uL   Basophils Absolute 0.0 0.0 - 0.2 x10E3/uL   Immature Granulocytes 0 Not Estab. %   Immature Grans (Abs) 0.0 0.0 - 0.1 x10E3/uL   Comprehensive metabolic panel   Collection Time: 05/19/23 11:39 AM  Result Value Ref Range   Glucose 94 70 - 99 mg/dL   BUN 14 8 - 27 mg/dL   Creatinine, Ser 0.34 0.57 - 1.00 mg/dL   eGFR 70 >74 QV/ZDG/3.87   BUN/Creatinine Ratio 17 12 - 28   Sodium 143 134 - 144 mmol/L   Potassium 4.9 3.5 - 5.2 mmol/L   Chloride 106 96 - 106 mmol/L   CO2 22 20 - 29 mmol/L   Calcium 9.5 8.7 - 10.3 mg/dL   Total Protein 6.7 6.0 - 8.5 g/dL   Albumin 4.5 3.8 - 4.8 g/dL   Globulin, Total 2.2 1.5 - 4.5 g/dL   Bilirubin Total 0.6 0.0 - 1.2 mg/dL   Alkaline Phosphatase 104 44 - 121 IU/L   AST 24 0 - 40 IU/L   ALT 18 0 - 32 IU/L  Lipid Panel w/o Chol/HDL Ratio   Collection Time: 05/19/23 11:39 AM  Result Value Ref Range   Cholesterol, Total 154 100 - 199 mg/dL   Triglycerides 67 0 - 149 mg/dL   HDL 75 >56 mg/dL   VLDL Cholesterol Cal 13 5 - 40 mg/dL   LDL Chol Calc (NIH) 66 0 - 99 mg/dL      Assessment & Plan:   Problem List Items Addressed This Visit       Cardiovascular and Mediastinum   Primary hypertension - Primary   Under good control on current regimen. Continue current regimen. Continue to monitor. Call with any concerns. Refills given. Labs drawn today.        Relevant Medications   furosemide  (LASIX ) 20 MG tablet   metoprolol  succinate (TOPROL -XL) 25 MG 24 hr tablet   pravastatin  (PRAVACHOL ) 40 MG tablet   Other Relevant Orders  Comprehensive metabolic panel with GFR   TSH   Microalbumin, Urine Waived   Diastolic heart failure (HCC)   Encouraged follow up with cardiology. Euvolemic today.      Relevant Medications   furosemide  (LASIX ) 20 MG tablet   metoprolol  succinate (TOPROL -XL) 25 MG 24 hr tablet   pravastatin  (PRAVACHOL ) 40 MG tablet   Senile purpura (HCC)   Reassured patient. Continue to monitor.       Relevant Medications   furosemide  (LASIX ) 20 MG tablet   metoprolol  succinate (TOPROL -XL) 25 MG 24 hr tablet   pravastatin  (PRAVACHOL ) 40 MG tablet   Other  Relevant Orders   CBC with Differential/Platelet   Comprehensive metabolic panel with GFR     Other   Hyperlipidemia   Under good control on current regimen. Continue current regimen. Continue to monitor. Call with any concerns. Refills given. Labs drawn today.       Relevant Medications   furosemide  (LASIX ) 20 MG tablet   metoprolol  succinate (TOPROL -XL) 25 MG 24 hr tablet   pravastatin  (PRAVACHOL ) 40 MG tablet   Other Relevant Orders   Comprehensive metabolic panel with GFR   Lipid Panel w/o Chol/HDL Ratio   Insomnia   Under good control on current regimen. Continue current regimen. Continue to monitor. Call with any concerns. Refills given. Labs drawn today.       Relevant Orders   Comprehensive metabolic panel with GFR   Depression, recurrent (HCC)   Under good control on current regimen. Continue current regimen. Continue to monitor. Call with any concerns. Refills given. Labs drawn today.       Relevant Medications   buPROPion  (WELLBUTRIN  XL) 150 MG 24 hr tablet   sertraline  (ZOLOFT ) 50 MG tablet   traZODone  (DESYREL ) 50 MG tablet   Other Relevant Orders   Comprehensive metabolic panel with GFR   Mixed stress and urge urinary incontinence   Will refer to urology. Call with any concerns.       Relevant Orders   Ambulatory referral to Urology     Follow up plan: Return for As scheduled with Maureen Sour, 6 months with me.

## 2023-11-14 NOTE — Assessment & Plan Note (Signed)
 Will refer to urology. Call with any concerns.

## 2023-11-15 ENCOUNTER — Ambulatory Visit: Payer: Self-pay | Admitting: Family Medicine

## 2023-11-15 LAB — CBC WITH DIFFERENTIAL/PLATELET
Basophils Absolute: 0 10*3/uL (ref 0.0–0.2)
Basos: 1 %
EOS (ABSOLUTE): 0.1 10*3/uL (ref 0.0–0.4)
Eos: 2 %
Hematocrit: 38.5 % (ref 34.0–46.6)
Hemoglobin: 13.1 g/dL (ref 11.1–15.9)
Immature Grans (Abs): 0 10*3/uL (ref 0.0–0.1)
Immature Granulocytes: 0 %
Lymphocytes Absolute: 0.9 10*3/uL (ref 0.7–3.1)
Lymphs: 26 %
MCH: 33.8 pg — ABNORMAL HIGH (ref 26.6–33.0)
MCHC: 34 g/dL (ref 31.5–35.7)
MCV: 99 fL — ABNORMAL HIGH (ref 79–97)
Monocytes Absolute: 0.3 10*3/uL (ref 0.1–0.9)
Monocytes: 7 %
Neutrophils Absolute: 2.4 10*3/uL (ref 1.4–7.0)
Neutrophils: 64 %
Platelets: 167 10*3/uL (ref 150–450)
RBC: 3.88 x10E6/uL (ref 3.77–5.28)
RDW: 12.8 % (ref 11.7–15.4)
WBC: 3.7 10*3/uL (ref 3.4–10.8)

## 2023-11-15 LAB — COMPREHENSIVE METABOLIC PANEL WITH GFR
ALT: 21 IU/L (ref 0–32)
AST: 22 IU/L (ref 0–40)
Albumin: 4.4 g/dL (ref 3.8–4.8)
Alkaline Phosphatase: 102 IU/L (ref 44–121)
BUN/Creatinine Ratio: 17 (ref 12–28)
BUN: 16 mg/dL (ref 8–27)
Bilirubin Total: 0.6 mg/dL (ref 0.0–1.2)
CO2: 21 mmol/L (ref 20–29)
Calcium: 9.4 mg/dL (ref 8.7–10.3)
Chloride: 106 mmol/L (ref 96–106)
Creatinine, Ser: 0.92 mg/dL (ref 0.57–1.00)
Globulin, Total: 2.1 g/dL (ref 1.5–4.5)
Glucose: 94 mg/dL (ref 70–99)
Potassium: 4.8 mmol/L (ref 3.5–5.2)
Sodium: 142 mmol/L (ref 134–144)
Total Protein: 6.5 g/dL (ref 6.0–8.5)
eGFR: 63 mL/min/{1.73_m2} (ref >59)

## 2023-11-15 LAB — LIPID PANEL W/O CHOL/HDL RATIO
Cholesterol, Total: 165 mg/dL (ref 100–199)
HDL: 75 mg/dL (ref >39)
LDL Chol Calc (NIH): 77 mg/dL (ref 0–99)
Triglycerides: 67 mg/dL (ref 0–149)
VLDL Cholesterol Cal: 13 mg/dL (ref 5–40)

## 2023-11-15 LAB — TSH: TSH: 1.67 u[IU]/mL (ref 0.450–4.500)

## 2023-11-17 ENCOUNTER — Ambulatory Visit

## 2023-11-23 ENCOUNTER — Other Ambulatory Visit: Payer: Self-pay | Admitting: Family Medicine

## 2023-11-24 NOTE — Telephone Encounter (Signed)
 Disp Refills Start End   furosemide  (LASIX ) 20 MG tablet 45 tablet 1 11/14/2023 --   Sig - Route: Take 1 tablet (20 mg total) by mouth every other day. - Oral   Sent to pharmacy as: furosemide  (LASIX ) 20 MG tablet   Notes to Pharmacy: **Patient requests 90 days supply**   E-Prescribing Status: Receipt confirmed by pharmacy (11/14/2023 10:52 AM EDT)     Requested Prescriptions  Pending Prescriptions Disp Refills   furosemide  (LASIX ) 20 MG tablet [Pharmacy Med Name: FUROSEMIDE  20MG  TABLETS] 45 tablet 1    Sig: TAKE 1 TABLET(20 MG) BY MOUTH EVERY OTHER DAY     Cardiovascular:  Diuretics - Loop Failed - 11/24/2023  2:06 PM      Failed - Mg Level in normal range and within 180 days    Magnesium  Date Value Ref Range Status  05/19/2023 2.2 1.6 - 2.3 mg/dL Final         Passed - K in normal range and within 180 days    Potassium  Date Value Ref Range Status  11/14/2023 4.8 3.5 - 5.2 mmol/L Final         Passed - Ca in normal range and within 180 days    Calcium  Date Value Ref Range Status  11/14/2023 9.4 8.7 - 10.3 mg/dL Final         Passed - Na in normal range and within 180 days    Sodium  Date Value Ref Range Status  11/14/2023 142 134 - 144 mmol/L Final         Passed - Cr in normal range and within 180 days    Creatinine, Ser  Date Value Ref Range Status  11/14/2023 0.92 0.57 - 1.00 mg/dL Final         Passed - Cl in normal range and within 180 days    Chloride  Date Value Ref Range Status  11/14/2023 106 96 - 106 mmol/L Final         Passed - Last BP in normal range    BP Readings from Last 1 Encounters:  11/14/23 133/73         Passed - Valid encounter within last 6 months    Recent Outpatient Visits           1 week ago Primary hypertension   [place] [facility] [place], [place], DO   4 months ago Labial abscess   [place] [facility] [place], [place], DO   4 months ago Labial abscess   [place] [facility] Vicci Duwaine SQUIBB, DO       Future Appointments             In 2 months MacDiarmid, [place], MD [facility] [place]   In 4 months Hester Alm BROCKS, MD [facility] [place]

## 2023-12-07 NOTE — Progress Notes (Unsigned)
   Cardiology Clinic Note   Date: 12/07/2023 ID: Mary [name], DOB 12/13/1940, MRN [id number]  Primary Cardiologist:  None  Chief Complaint   Mary [name] is a 83 y.o. female who presents to the clinic today for ***  Patient Profile   Mary [name] is followed by Dr. Gollan for the history outlined below.      Past medical history significant for: Chronic diastolic heart failure. Palpitations. Hypertension. Hyperlipidemia. OSA. CPAP*** GERD. Lymphedema.  In summary, patient was previously followed by cardiology in [place] .  She establish care with Dr. Gollan on 03/20/2021.  She reported a past history of very symptomatic tachycardia.  She wore a heart monitor but did not have symptoms while monitor was in place.  She was told monitor showed a 4 beat run of VT and she was started on Toprol  50 mg daily.  Secondary to low BP the dose was decreased to 25 mg daily.  She reported actively working on weight loss through dietary changes.  Referral was made to OT to assist with managing lymphedema.  Patient was last seen in the office by Cadence Furth, PA-C on 09/13/2022 for routine follow-up.  She was doing well at that time with no complaints.     History of Present Illness    Today, patient ***  Palpitations/paroxysmal tachycardia Heart monitor from outside facility in [place]  demonstrated 4 beat run of VT.  Patient*** - Continue Toprol .  Lower extremity edema/lymphedema Patient reports utilizing lymphedema pumps***  -Continue Lasix .  Hypertension BP today*** - Continue Toprol .  ROS: All other systems reviewed and are otherwise negative except as noted in History of Present Illness.  EKGs/Labs Reviewed        11/14/2023: ALT 21; AST 22; BUN 16; Creatinine, Ser 0.92; Potassium 4.8; Sodium 142   11/14/2023: Hemoglobin 13.1; WBC 3.7   11/14/2023: TSH 1.670   No results found for requested labs within last 365 days.  ***  Risk Assessment/Calculations    {Does this patient  have ATRIAL FIBRILLATION?:[phone]} No BP recorded.  {Refresh Note OR Click here to enter BP  :1}***        Physical Exam    VS:  There were no vitals taken for this visit. , BMI There is no height or weight on file to calculate BMI.  GEN: Well nourished, well developed, in no acute distress. Neck: No JVD or carotid bruits. Cardiac: *** RRR. *** No murmur. No rubs or gallops.   Respiratory:  Respirations regular and unlabored. Clear to auscultation without rales, wheezing or rhonchi. GI: Soft, nontender, nondistended. Extremities: Radials/DP/PT 2+ and equal bilaterally. No clubbing or cyanosis. No edema ***  Skin: Warm and dry, no rash. Neuro: Strength intact.  Assessment & Plan   ***  Disposition: ***     {Are you ordering a CV Procedure (e.g. stress test, cath, DCCV, TEE, etc)?   Press F2        :[id number]}   Signed, Barnie HERO. Mareta Chesnut, DNP, NP-C

## 2023-12-08 ENCOUNTER — Encounter: Payer: Self-pay | Admitting: Student

## 2023-12-08 ENCOUNTER — Ambulatory Visit: Attending: Student | Admitting: Student

## 2023-12-08 VITALS — BP 120/62 | HR 77 | Ht 64.0 in | Wt 167.6 lb

## 2023-12-08 DIAGNOSIS — I890 Lymphedema, not elsewhere classified: Secondary | ICD-10-CM | POA: Insufficient documentation

## 2023-12-08 DIAGNOSIS — R002 Palpitations: Secondary | ICD-10-CM | POA: Insufficient documentation

## 2023-12-08 DIAGNOSIS — I479 Paroxysmal tachycardia, unspecified: Secondary | ICD-10-CM | POA: Diagnosis present

## 2023-12-08 DIAGNOSIS — R600 Localized edema: Secondary | ICD-10-CM | POA: Insufficient documentation

## 2023-12-08 DIAGNOSIS — I10 Essential (primary) hypertension: Secondary | ICD-10-CM | POA: Insufficient documentation

## 2023-12-08 NOTE — Patient Instructions (Signed)
 Medication Instructions:  Your physician recommends that you continue on your current medications as directed. Please refer to the Current Medication list given to you today.   *If you need a refill on your cardiac medications before your next appointment, please call your pharmacy*  Lab Work: None ordered at this time  If you have labs (blood work) drawn today and your tests are completely normal, you will receive your results only by: MyChart Message (if you have MyChart) OR A paper copy in the mail If you have any lab test that is abnormal or we need to change your treatment, we will call you to review the results.  Testing/Procedures: None ordered at this time   Follow-Up: At [facility], you and your health needs are our priority.  As part of our continuing mission to provide you with exceptional heart care, our providers are all part of one team.  This team includes your primary Cardiologist (physician) and Advanced Practice Providers or APPs (Physician Assistants and Nurse Practitioners) who all work together to provide you with the care you need, when you need it.  Your next appointment:   1 year(s)  Provider:   Evalene Lunger, MD or Barnie Hila, NP    We recommend signing up for the patient portal called MyChart.  Sign up information is provided on this After Visit Summary.  MyChart is used to connect with patients for Virtual Visits (Telemedicine).  Patients are able to view lab/test results, encounter notes, upcoming appointments, etc.  Non-urgent messages can be sent to your provider as well.   To learn more about what you can do with MyChart, go to [email or web address].

## 2024-01-26 ENCOUNTER — Ambulatory Visit (INDEPENDENT_AMBULATORY_CARE_PROVIDER_SITE_OTHER): Admitting: Emergency Medicine

## 2024-01-26 VITALS — Ht 63.5 in | Wt 165.0 lb

## 2024-01-26 DIAGNOSIS — Z0000 Encounter for general adult medical examination without abnormal findings: Secondary | ICD-10-CM

## 2024-01-26 NOTE — Progress Notes (Signed)
 Subjective:   Mary [name] is a 83 y.o. who presents for a Medicare Wellness preventive visit.  As a reminder, Annual Wellness Visits don't include a physical exam, and some assessments may be limited, especially if this visit is performed virtually. We may recommend an in-person follow-up visit with your provider if needed.  Visit Complete: Virtual I connected with  Mary [name] on 01/26/24 by a audio enabled telemedicine application and verified that I am speaking with the correct person using two identifiers.  Patient Location: Home  Provider Location: Home Office  I discussed the limitations of evaluation and management by telemedicine. The patient expressed understanding and agreed to proceed.  Vital Signs: Because this visit was a virtual/telehealth visit, some criteria may be missing or patient reported. Any vitals not documented were not able to be obtained and vitals that have been documented are patient reported.  VideoError- Librarian, academic were attempted between this provider and patient, however failed, due to patient having technical difficulties OR patient did not have access to video capability.  We continued and completed visit with audio only.   Persons Participating in Visit: Patient.  AWV Questionnaire: Yes: Patient Medicare AWV questionnaire was completed by the patient on 01/25/24; I have confirmed that all information answered by patient is correct and no changes since this date.  Cardiac Risk Factors include: advanced age (>39men, >62 women);dyslipidemia;hypertension;Other (see comment), Risk factor comments: OSA (no cpap)     Objective:    Today's Vitals   01/26/24 0919  Weight: 165 lb (74.8 kg)  Height: 5' 3.5 (1.613 m)   Body mass index is 28.77 kg/m.     01/26/2024    9:36 AM 04/05/2022    2:34 PM 11/03/2021   12:24 PM 07/31/2021   10:08 AM  Advanced Directives  Does Patient Have a Medical Advance Directive? Yes Yes Yes  Yes  Type of Estate agent of [place];Living will Living will Healthcare Power of Attorney Living will  Does patient want to make changes to medical advance directive? No - Patient declined     Copy of Healthcare Power of Attorney in Chart? No - copy requested  No - copy requested     Current Medications (verified) Outpatient Encounter Medications as of 01/26/2024  Medication Sig   acetaminophen (TYLENOL) 500 MG tablet Take 500 mg by mouth every 6 (six) hours as needed.   buPROPion  (WELLBUTRIN  XL) 150 MG 24 hr tablet Take 1 tablet (150 mg total) by mouth daily.   Cholecalciferol (VITAMIN D3) 25 MCG (1000 UT) CAPS Take 1,000 Units by mouth daily.   furosemide  (LASIX ) 20 MG tablet Take 1 tablet (20 mg total) by mouth every other day.   metoprolol  succinate (TOPROL -XL) 25 MG 24 hr tablet Take 1 tablet (25 mg total) by mouth daily. (Patient taking differently: Take 25 mg by mouth daily. Taking 1/2 tablet daily)   pravastatin  (PRAVACHOL ) 40 MG tablet Take 1 tablet (40 mg total) by mouth daily.   traZODone  (DESYREL ) 50 MG tablet TAKE 1/2 TO 1 TABLET(25 TO 50 MG) BY MOUTH AT BEDTIME AS NEEDED FOR SLEEP (Patient taking differently: TAKE 1/2 TO 1 TABLET(25 TO 50 MG) BY MOUTH AT BEDTIME AS NEEDED FOR SLEEP Rarely needs to take)   sertraline  (ZOLOFT ) 50 MG tablet TAKE 1 TABLET(50 MG) BY MOUTH DAILY (Patient not taking: Reported on 01/26/2024)   No facility-administered encounter medications on file as of 01/26/2024.    Allergies (verified) Codeine and Simvastatin  History: Past Medical History:  Diagnosis Date   Arrhythmia    Back pain    Basal cell carcinoma    L lower leg txted in [place]  ~ 2016   Depression with anxiety    Hip pain    Hyperlipidemia    Hypertension    Neuromuscular disorder (HCC)    Paroxysmal tachycardia (HCC)    Past Surgical History:  Procedure Laterality Date   ABDOMINAL HYSTERECTOMY     BREAST BIOPSY     BREAST EXCISIONAL BIOPSY Left 2002    benign   CARDIAC CATHETERIZATION     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   x2   TOTAL HIP ARTHROPLASTY Bilateral    done twice   Family History  Problem Relation Age of Onset   Hypertension Mother    CAD Mother    Pancreatic cancer Mother    Lung cancer Father    Hypertension Sister    Multiple sclerosis Sister    Breast cancer Neg Hx    Social History   Socioeconomic History   Marital status: Widowed    Spouse name: Not on file   Number of children: 1   Years of education: Not on file   Highest education level: Some college, no degree  Occupational History   Occupation: retired    Start: 10/29/2006  Tobacco Use   Smoking status: Former    Current packs/day: 0.00    Average packs/day: 0.5 packs/day for 3.0 years (1.5 ttl pk-yrs)    Types: Cigarettes    Start date: 1963    Quit date: 1966    Years since quitting: 59.6   Smokeless tobacco: Never  Vaping Use   Vaping status: Never Used  Substance and Sexual Activity   Alcohol use: Yes    Alcohol/week: 7.0 standard drinks of alcohol    Types: 7 Glasses of wine per week    Comment: 1 glass of wine daily   Drug use: Never   Sexual activity: Not Currently  Other Topics Concern   Not on file  Social History Narrative   Not on file   Social Drivers of Health   Financial Resource Strain: Low Risk  (01/26/2024)   Overall Financial Resource Strain (CARDIA)    Difficulty of Paying Living Expenses: Not hard at all  Food Insecurity: No Food Insecurity (01/26/2024)   Hunger Vital Sign    Worried About Running Out of Food in the Last Year: Never true    Ran Out of Food in the Last Year: Never true  Transportation Needs: No Transportation Needs (01/26/2024)   PRAPARE - Administrator, Civil Service (Medical): No    Lack of Transportation (Non-Medical): No  Physical Activity: Inactive (01/26/2024)   Exercise Vital Sign    Days of Exercise per Week: 0 days    Minutes of Exercise per Session: 0 min  Stress: No Stress  Concern Present (01/26/2024)   [employer] of Occupational Health - Occupational Stress Questionnaire    Feeling of Stress: Only a little  Social Connections: Moderately Integrated (01/26/2024)   Social Connection and Isolation Panel    Frequency of Communication with Friends and Family: More than three times a week    Frequency of Social Gatherings with Friends and Family: More than three times a week    Attends Religious Services: More than 4 times per year    Active Member of [employer] or Organizations: Yes    Attends Banker Meetings: More than 4 times per  year    Marital Status: Widowed    Tobacco Counseling Counseling given: Not Answered    Clinical Intake:  Pre-visit preparation completed: Yes  Pain : No/denies pain     BMI - recorded: 28.77 Nutritional Status: BMI 25 -29 Overweight Nutritional Risks: None Diabetes: No  No results found for: HGBA1C   How often do you need to have someone help you when you read instructions, pamphlets, or other written materials from your doctor or pharmacy?: 1 - Never  Interpreter Needed?: No  Information entered by :: Vina Ned, CMA   Activities of Daily Living     01/26/2024    9:22 AM 01/25/2024   10:54 AM  In your present state of health, do you have any difficulty performing the following activities:  Hearing? 0 0  Vision? 0 0  Difficulty concentrating or making decisions? 0 0  Walking or climbing stairs? 0 0  Dressing or bathing? 0 0  Doing errands, shopping? 0 0  Preparing Food and eating ? N N  Using the Toilet? N N  In the past six months, have you accidently leaked urine? Mary [name] Mary [name]  Comment wears pad   Do you have problems with loss of bowel control? N N  Managing your Medications? N N  Managing your Finances? N N  Housekeeping or managing your Housekeeping? Mary [name] Mary [name]  Comment has a house cleaner twice a month     Patient Care Team: Vicci Duwaine SQUIBB, DO as PCP - General (Family Medicine) Perla Evalene PARAS, MD as Consulting Physician (Cardiology) Laurice Francis NOVAK, [place] (Optometry) Loistine Sober, NP as Nurse Practitioner (Cardiology) Hester Alm BROCKS, MD (Dermatology)  I have updated your Care Teams any recent Medical Services you may have received from other providers in the past year.     Assessment:   This is a routine wellness examination for Mary [name].  Hearing/Vision screen Hearing Screening - Comments:: Denies hearing loss  Vision Screening - Comments:: Gets routine eye exams, [facility], Dr. Lynnette Dee, [place]   Goals Addressed               This Visit's Progress     Increase physical activity (pt-stated)         Depression Screen     01/26/2024    9:33 AM 11/14/2023   10:41 AM 07/12/2023    2:28 PM 05/19/2023   11:26 AM 11/16/2022   11:14 AM 07/15/2022   10:01 AM 01/15/2022   11:02 AM  PHQ 2/9 Scores  PHQ - 2 Score 0 0 0 1 0 0 0  PHQ- 9 Score 1 1 0 1 1 1 1     Fall Risk     01/26/2024    9:39 AM 01/25/2024   10:54 AM 07/12/2023    2:28 PM 11/16/2022   11:13 AM 07/15/2022   10:00 AM  Fall Risk   Falls in the past year? 1 1 1 1 1   Number falls in past yr: 0 0 0 1 1  Injury with Fall? 0 0 0 0 1  Risk for fall due to : History of fall(s);Impaired balance/gait;Orthopedic patient;Impaired mobility  History of fall(s);Impaired mobility;Impaired balance/gait History of fall(s) No Fall Risks  Follow up Falls evaluation completed;Education provided  Falls evaluation completed Falls evaluation completed Falls evaluation completed    MEDICARE RISK AT HOME:  Medicare Risk at Home Any stairs in or around the home?: Yes (1 step) If so, are there any without handrails?: No Home  free of loose throw rugs in walkways, pet beds, electrical cords, etc?: Yes Adequate lighting in your home to reduce risk of falls?: Yes Life alert?: No Use of a cane, walker or w/c?: Yes (cane, rolling walker and rollator) Grab bars in the bathroom?: Yes Shower chair or bench in  shower?: Yes Elevated toilet seat or a handicapped toilet?: Yes  TIMED UP AND GO:  Was the test performed?  No  Cognitive Function: 6CIT completed        01/26/2024    9:40 AM 11/16/2022   11:42 AM 11/03/2021   12:21 PM  6CIT Screen  What Year? 0 points 0 points 0 points  What month? 0 points 0 points 0 points  What time? 0 points 0 points 0 points  Count back from 20 0 points 0 points 0 points  Months in reverse 0 points 0 points 0 points  Repeat phrase 0 points 0 points 0 points  Total Score 0 points 0 points 0 points    Immunizations Immunization History  Administered Date(s) Administered   Fluad Quad(high Dose 65+) 03/18/2020, 02/28/2022   INFLUENZA, HIGH DOSE SEASONAL PF 03/10/2018   Influenza,inj,quad, With Preservative 03/01/2019, 03/09/2019   Influenza-Unspecified 03/01/2011, 04/05/2017, 03/09/2019, 03/25/2022, 05/17/2023   Moderna Sars-Covid-2 Vaccination 08/30/2019, 09/20/2019   PNEUMOCOCCAL CONJUGATE-20 12/16/2021   Pneumococcal Conjugate-13 05/02/2014   Pneumococcal Polysaccharide-23 07/23/2015   Pneumococcal-Unspecified 03/01/2011, 04/05/2017   Respiratory Syncytial Virus Vaccine,Recomb Aduvanted(Arexvy) 03/25/2022   Tdap 12/16/2021   Unspecified SARS-COV-2 Vaccination 05/17/2023    Screening Tests Health Maintenance  Topic Date Due   COVID-19 Vaccine (4 - Mixed Product risk 2024-25 season) 11/15/2023   INFLUENZA VACCINE  12/30/2023   Zoster Vaccines- Shingrix (1 of 2) 02/14/2024 (Originally 12/01/1959)   MAMMOGRAM  10/16/2024   DEXA SCAN  01/13/2025   Medicare Annual Wellness (AWV)  01/25/2025   DTaP/Tdap/Td (2 - Td or Tdap) 12/17/2031   Pneumococcal Vaccine: 50+ Years  Completed   HPV VACCINES  Aged Out   Meningococcal B Vaccine  Aged Out   Hepatitis C Screening  Discontinued    Health Maintenance  Health Maintenance Due  Topic Date Due   COVID-19 Vaccine (4 - Mixed Product risk 2024-25 season) 11/15/2023   INFLUENZA VACCINE  12/30/2023    Health Maintenance Items Addressed: See Nurse Notes at the end of this note  Additional Screening:  Vision Screening: Recommended annual ophthalmology exams for early detection of glaucoma and other disorders of the eye. Would you like a referral to an eye doctor? No    Dental Screening: Recommended annual dental exams for proper oral hygiene  Community Resource Referral / Chronic Care Management: CRR required this visit?  No   CCM required this visit?  No   Plan:    I have personally reviewed and noted the following in the patient's chart:   Medical and social history Use of alcohol, tobacco or illicit drugs  Current medications and supplements including opioid prescriptions. Patient is not currently taking opioid prescriptions. Functional ability and status Nutritional status Physical activity Advanced directives List of other physicians Hospitalizations, surgeries, and ER visits in previous 12 months Vitals Screenings to include cognitive, depression, and falls Referrals and appointments  In addition, I have reviewed and discussed with patient certain preventive protocols, quality metrics, and best practice recommendations. A written personalized care plan for preventive services as well as general preventive health recommendations were provided to patient.   Vina Ned, CMA   01/26/2024   After Visit Summary: (  Mail) Due to this being a telephonic visit, the after visit summary with patients personalized plan was offered to patient via mail   Notes:  Covid and flu vaccines in the fall Patient thinks she may have had Shingles vaccine in [place] . She is going to check and if she hasn't, she will get at local pharmacy. Screening colonoscopy no longer recommended due to age

## 2024-01-26 NOTE — Patient Instructions (Signed)
 Mary [name] , Thank you for taking time out of your busy schedule to complete your Annual Wellness Visit with me. I enjoyed our conversation and look forward to speaking with you again next year. I, as well as your care team,  appreciate your ongoing commitment to your health goals. Please review the following plan we discussed and let me know if I can assist you in the future. Your Game plan/ To Do List    Referrals: None   Follow up Visits: We will see or speak with you next year for your Next Medicare AWV with our clinical staff Have you seen your provider in the last 6 months (3 months if uncontrolled diabetes)? Yes  Clinician Recommendations: Get the Covid and flu vaccines at your convenience. Check with pharmacy in [place]  to confirm if you have had the shingles vaccines.  If you haven't had the vaccines, you may get them at your local pharmacy.  Aim for 30 minutes of exercise or brisk walking, 6-8 glasses of water, and 5 servings of fruits and vegetables each day.       This is a list of the screenings recommended for you:  Health Maintenance  Topic Date Due   COVID-19 Vaccine (4 - Mixed Product risk 2024-25 season) 11/15/2023   Flu Shot  12/30/2023   Zoster (Shingles) Vaccine (1 of 2) 02/14/2024*   Mammogram  10/16/2024   DEXA scan (bone density measurement)  01/13/2025   Medicare Annual Wellness Visit  01/25/2025   DTaP/Tdap/Td vaccine (2 - Td or Tdap) 12/17/2031   Pneumococcal Vaccine for age over 62  Completed   HPV Vaccine  Aged Out   Meningitis B Vaccine  Aged Out   Hepatitis C Screening  Discontinued  *Topic was postponed. The date shown is not the original due date.    Advanced directives: (Copy Requested) Please bring a copy of your health care power of attorney and living will to the office to be added to your chart at your convenience. You can mail to [facility] [address] [place], [place] [address] or email to [email or web address] Advance  Care Planning is important because it:  [x]  Makes sure you receive the medical care that is consistent with your values, goals, and preferences  [x]  It provides guidance to your family and loved ones and reduces their decisional burden about whether or not they are making the right decisions based on your wishes.  Follow the link provided in your after visit summary or read over the paperwork we have mailed to you to help you started getting your Advance Directives in place. If you need assistance in completing these, please reach out to us  so that we can help you!  See attachments for Preventive Care and Fall Prevention Tips.   Fall Prevention in the Home, Adult Falls can cause injuries and affect people of all ages. There are many simple things that you can do to make your home safe and to help prevent falls. If you need it, ask for help making these changes. What actions can I take to prevent falls? General information Use good lighting in all rooms. Make sure to: Replace any light bulbs that burn out. Turn on lights if it is dark and use night-lights. Keep items that you use often in easy-to-reach places. Lower the shelves around your home if needed. Move furniture so that there are clear paths around it. Do not keep throw rugs or other things on the floor that  can make you trip. If any of your floors are uneven, fix them. Add color or contrast paint or tape to clearly mark and help you see: Grab bars or handrails. First and last steps of staircases. Where the edge of each step is. If you use a ladder or stepladder: Make sure that it is fully opened. Do not climb a closed ladder. Make sure the sides of the ladder are locked in place. Have someone hold the ladder while you use it. Know where your pets are as you move through your home. What can I do in the bathroom?     Keep the floor dry. Clean up any water that is on the floor right away. Remove soap buildup in the bathtub or  shower. Buildup makes bathtubs and showers slippery. Use non-skid mats or decals on the floor of the bathtub or shower. Attach bath mats securely with double-sided, non-slip rug tape. If you need to sit down while you are in the shower, use a non-slip stool. Install grab bars by the toilet and in the bathtub and shower. Do not use towel bars as grab bars. What can I do in the bedroom? Make sure that you have a light by your bed that is easy to reach. Do not use any sheets or blankets on your bed that hang to the floor. Have a firm bench or chair with side arms that you can use for support when you get dressed. What can I do in the kitchen? Clean up any spills right away. If you need to reach something above you, use a sturdy step stool that has a grab bar. Keep electrical cables out of the way. Do not use floor polish or wax that makes floors slippery. What can I do with my stairs? Do not leave anything on the stairs. Make sure that you have a light switch at the top and the bottom of the stairs. Have them installed if you do not have them. Make sure that there are handrails on both sides of the stairs. Fix handrails that are broken or loose. Make sure that handrails are as long as the staircases. Install non-slip stair treads on all stairs in your home if they do not have carpet. Avoid having throw rugs at the top or bottom of stairs, or secure the rugs with carpet tape to prevent them from moving. Choose a carpet design that does not hide the edge of steps on the stairs. Make sure that carpet is firmly attached to the stairs. Fix any carpet that is loose or worn. What can I do on the outside of my home? Use bright outdoor lighting. Repair the edges of walkways and driveways and fix any cracks. Clear paths of anything that can make you trip, such as tools or rocks. Add color or contrast paint or tape to clearly mark and help you see high doorway thresholds. Trim any bushes or trees on the  main path into your home. Check that handrails are securely fastened and in good repair. Both sides of all steps should have handrails. Install guardrails along the edges of any raised decks or porches. Have leaves, snow, and ice cleared regularly. Use sand, salt, or ice melt on walkways during winter months if you live where there is ice and snow. In the garage, clean up any spills right away, including grease or oil spills. What other actions can I take? Review your medicines with your health care provider. Some medicines can make you confused or  feel dizzy. This can increase your chance of falling. Wear closed-toe shoes that fit well and support your feet. Wear shoes that have rubber soles and low heels. Use a cane, walker, scooter, or crutches that help you move around if needed. Talk with your provider about other ways that you can decrease your risk of falls. This may include seeing a physical therapist to learn to do exercises to improve movement and strength. Where to find more information Centers for Disease Control and Prevention, STEADI: TonerPromos.no [employer] on Aging: BaseRingTones.pl National Institute on Aging: BaseRingTones.pl Contact a health care provider if: You are afraid of falling at home. You feel weak, drowsy, or dizzy at home. You fall at home. Get help right away if you: Lose consciousness or have trouble moving after a fall. Have a fall that causes a head injury. These symptoms may be an emergency. Get help right away. Call 911. Do not wait to see if the symptoms will go away. Do not drive yourself to the hospital. This information is not intended to replace advice given to you by your health care provider. Make sure you discuss any questions you have with your health care provider. Document Revised: 01/18/2022 Document Reviewed: 01/18/2022 Elsevier Patient Education  2024 [employer].

## 2024-02-06 ENCOUNTER — Ambulatory Visit (INDEPENDENT_AMBULATORY_CARE_PROVIDER_SITE_OTHER): Admitting: Urology

## 2024-02-06 VITALS — BP 151/72 | HR 75

## 2024-02-06 DIAGNOSIS — N3946 Mixed incontinence: Secondary | ICD-10-CM | POA: Diagnosis not present

## 2024-02-06 LAB — MICROSCOPIC EXAMINATION: Epithelial Cells (non renal): >10 /HPF — AB (ref 0–10)

## 2024-02-06 LAB — URINALYSIS, COMPLETE
Bilirubin, UA: NEGATIVE
Glucose, UA: NEGATIVE
Ketones, UA: NEGATIVE
Nitrite, UA: NEGATIVE
Protein,UA: NEGATIVE
Specific Gravity, UA: 1.02 (ref 1.005–1.030)
Urobilinogen, Ur: 0.2 mg/dL (ref 0.2–1.0)
pH, UA: 6 (ref 5.0–7.5)

## 2024-02-06 MED ORDER — GEMTESA 75 MG PO TABS: 75.0000 mg | ORAL_TABLET | Freq: Every day | ORAL | 11 refills | Status: DC

## 2024-02-06 MED ORDER — GEMTESA 75 MG PO TABS: 75.0000 mg | ORAL_TABLET | Freq: Every day | ORAL | Status: AC

## 2024-02-06 NOTE — Patient Instructions (Signed)

## 2024-02-06 NOTE — Progress Notes (Signed)
 02/06/2024 11:12 AM   Mary [name] May 26, 1941 [id number]  Referring provider: Vicci Duwaine SQUIBB, DO [address] [place],  [place] [address]  No chief complaint on file.   HPI: I was consulted to assist the patient as urinary incontinence.  She leaks with coughing sneezing and likely not bending and lifting.  She has urge incontinence.  She says she leaks a lot when she laughs.  I could not sort out which symptom was worse.  She has small-volume bedwetting.  He wears 2 pads a day sometimes moderately wet and sometimes soaked  She voids every 2-3 hours and gets up once or twice at night.  Flow was slow to modest.  She can push her cough at the end of urination and leaking again.  She has had a hysterectomy and low back surgery  Uses a walker for hip issues.Retired from [employer] in [place]   No history of kidney stones bladder surgery or bladder infections.  No treatment  PMH: Past Medical History:  Diagnosis Date   Arrhythmia    Back pain    Basal cell carcinoma    L lower leg txted in [place]  ~ 2016   Depression with anxiety    Hip pain    Hyperlipidemia    Hypertension    Neuromuscular disorder (HCC)    Paroxysmal tachycardia (HCC)     Surgical History: Past Surgical History:  Procedure Laterality Date   ABDOMINAL HYSTERECTOMY     BREAST BIOPSY     BREAST EXCISIONAL BIOPSY Left 2002   benign   CARDIAC CATHETERIZATION     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   x2   TOTAL HIP ARTHROPLASTY Bilateral    done twice    Home Medications:  Allergies as of 02/06/2024       Reactions   Codeine Nausea Only, Nausea And Vomiting   Simvastatin    Back pain Other reaction(s): back pain        Medication List        Accurate as of February 06, 2024 11:12 AM. If you have any questions, ask your nurse or doctor.          acetaminophen 500 MG tablet Commonly known as: TYLENOL Take 500 mg by mouth every 6 (six) hours as needed.   buPROPion  150 MG 24 hr tablet Commonly known as:  WELLBUTRIN  XL Take 1 tablet (150 mg total) by mouth daily.   furosemide  20 MG tablet Commonly known as: LASIX  Take 1 tablet (20 mg total) by mouth every other day.   metoprolol  succinate 25 MG 24 hr tablet Commonly known as: TOPROL -XL Take 1 tablet (25 mg total) by mouth daily. What changed: additional instructions   pravastatin  40 MG tablet Commonly known as: PRAVACHOL  Take 1 tablet (40 mg total) by mouth daily.   sertraline  50 MG tablet Commonly known as: ZOLOFT  TAKE 1 TABLET(50 MG) BY MOUTH DAILY   traZODone  50 MG tablet Commonly known as: DESYREL  TAKE 1/2 TO 1 TABLET(25 TO 50 MG) BY MOUTH AT BEDTIME AS NEEDED FOR SLEEP What changed: additional instructions   Vitamin D3 25 MCG (1000 UT) Caps Take 1,000 Units by mouth daily.        Allergies:  Allergies  Allergen Reactions   Codeine Nausea Only and Nausea And Vomiting   Simvastatin     Back pain Other reaction(s): back pain    Family History: Family History  Problem Relation Age of Onset   Hypertension Mother    CAD Mother  Pancreatic cancer Mother    Lung cancer Father    Hypertension Sister    Multiple sclerosis Sister    Breast cancer Neg Hx     Social History:  reports that she quit smoking about 59 years ago. Her smoking use included cigarettes. She started smoking about 62 years ago. She has a 1.5 pack-year smoking history. She has never used smokeless tobacco. She reports current alcohol use of about 7.0 standard drinks of alcohol per week. She reports that she does not use drugs.  ROS:                                        Physical Exam: There were no vitals taken for this visit.  Constitutional:  Alert and oriented, No acute distress. HEENT: [place] AT, moist mucus membranes.  Trachea midline, no masses.   Laboratory Data: Lab Results  Component Value Date   WBC 3.7 11/14/2023   HGB 13.1 11/14/2023   HCT 38.5 11/14/2023   MCV 99 (H) 11/14/2023   PLT 167 11/14/2023     Lab Results  Component Value Date   CREATININE 0.92 11/14/2023    No results found for: PSA  No results found for: TESTOSTERONE  No results found for: HGBA1C  Urinalysis    Component Value Date/Time   APPEARANCEUR Cloudy (A) 11/16/2022 1116   GLUCOSEU Negative 11/16/2022 1116   BILIRUBINUR Negative 11/16/2022 1116   PROTEINUR Negative 11/16/2022 1116   NITRITE Negative 11/16/2022 1116   LEUKOCYTESUR Negative 11/16/2022 1116    Pertinent Imaging: Urine reviewed and sent for culture.  Chart reviewed.    Assessment & Plan: I thought was reasonable for the patient to back for pelvic examination and cystoscopy.  She understands I may order a test in the future without detail.  Gemtesa  samples and prescription given.  Call if culture positive.  Reassess for blood in the urine next visit  1. Mixed incontinence urge and stress (Primary)    No follow-ups on file.  Mary DELENA Elizabeth, MD  [facility] [address] [place], [place] [address] [phone]

## 2024-02-08 ENCOUNTER — Telehealth: Payer: Self-pay

## 2024-02-08 MED ORDER — OXYBUTYNIN CHLORIDE ER 10 MG PO TB24: 10.0000 mg | ORAL_TABLET | Freq: Every day | ORAL | 11 refills | Status: DC

## 2024-02-08 NOTE — Telephone Encounter (Signed)
 Pt's insurance is not covering gemtesa . Alternative options are tolerodine tartrate, solifenacin or oxybutynin . Pt has not tried any of these in the past. Please advise.

## 2024-02-08 NOTE — Addendum Note (Signed)
 Addended byBETHA CORIE PLATER on: 02/08/2024 04:51 PM   Modules accepted: Orders

## 2024-02-10 LAB — CULTURE, URINE COMPREHENSIVE

## 2024-02-17 ENCOUNTER — Telehealth: Payer: Self-pay

## 2024-02-17 NOTE — Telephone Encounter (Signed)
 PA has been sent to [facility] Waiting on response.

## 2024-03-16 ENCOUNTER — Telehealth: Payer: Self-pay

## 2024-03-16 ENCOUNTER — Ambulatory Visit: Admitting: Pediatrics

## 2024-03-16 ENCOUNTER — Ambulatory Visit: Payer: Self-pay

## 2024-03-16 DIAGNOSIS — N3946 Mixed incontinence: Secondary | ICD-10-CM

## 2024-03-16 MED ORDER — OXYBUTYNIN CHLORIDE ER 10 MG PO TB24: 10.0000 mg | ORAL_TABLET | Freq: Every day | ORAL | 11 refills | Status: AC

## 2024-03-16 NOTE — Telephone Encounter (Signed)
 Pt LM on triage stating that she cannot afford Gemtesa  at $600/month. Advised pt that Oxybuytnin has been sent in for her as an alternative to try. Advised her to call back if oxybuytnin is too expensive as we may be able to give her a goodrx or singlecare coupon. Pt voiced understanding.

## 2024-03-16 NOTE — Telephone Encounter (Signed)
 FYI Only or Action Required?: FYI only for provider.  Patient was last seen in primary care on 11/14/2023 by Vicci Duwaine SQUIBB, DO.  Called Nurse Triage reporting Cyst.  Symptoms began several weeks ago.  Interventions attempted: Prescription medications: Antibiotic, ointment.  Symptoms are: stable.  Triage Disposition: See PCP When Office is Open (Within 3 Days)  Patient/caregiver understands and will follow disposition?: Yes Reason for Disposition  [1] Small swelling or lump AND [2] unexplained AND [3] present > 1 week  Answer Assessment - Initial Assessment Questions Previously seen by PCP on 9/8, given antibiotic. Went away but came back, and has bled last week.  1. APPEARANCE of SWELLING: What does it look like?     Looks like a small cyst, but flat now  2. SIZE: How large is the swelling? (e.g., inches, cm; or compare to size of pinhead, tip of pen, eraser, coin, pea, grape, ping pong ball)      3/4 inch   3. LOCATION: Where is the swelling located?     Vaginal area, left side bikini line  4. ONSET: When did the swelling start?     1 month ago  5. COLOR: What color is it? Is there more than one color?     Skin colored  6. PAIN: Is there any pain? If Yes, ask: How bad is the pain? (Scale 1-10; or mild, moderate, severe)       Denies  7. ITCH: Does it itch? If Yes, ask: How bad is the itch?      Denies  8. CAUSE: What do you think caused the swelling?     Unsure  9 OTHER SYMPTOMS: Do you have any other symptoms? (e.g., fever)     Noticed it bleeding about a week ago.  Protocols used: Skin Lump or Localized Swelling-A-AH  Copied from CRM #[id number]. Topic: Clinical - Red Word Triage >> Mar 16, 2024 10:51 AM Treva T wrote: [facility] that prompted transfer to Nurse Triage: Patient calling, states she has a cyst/swelling in the vaginal area, was bleeding slightly, with some discomfort.  Patient states she was previously prescribed antibiotic by  PCP, and wants to know if she can have something called in again.

## 2024-03-18 NOTE — Patient Instructions (Incomplete)
 Pocket of Fluid at the Base of the Labia (Bartholin's Cyst): What to Know  A Bartholin's cyst is a pocket of fluid that forms on a Bartholin's gland. Bartholin's glands are small glands in the labia. The labia are folds of skin on the sides of the vaginal opening. This type of cyst causes a bulge or lump near the opening of the vagina. If you have a cyst that's small and not infected, you may be able to take care of it at home. If your cyst gets infected, it may cause pain. Your doctor may need to drain it. If you're older than 81 years old, you should talk with your doctor about whether some of the cyst or area around it should be taken out and checked for problems. What are the causes? The cyst may be caused by a blocked Bartholin's gland. Germs inside of the cyst can cause an infection. In many cases, the cause isn't known. What are the signs or symptoms? Symptoms may include: A bulge or lump near the opening of the vagina. Discomfort or pain that gets worse when you: Have sex. Walk. Sit. Redness, swelling, or fluid draining from the area. These are signs of an abscess. Other worse symptoms may develop based on: The size of your cyst. Whether you have an abscess from infection. How is this treated? You may not need treatment if your cyst isn't causing symptoms. The cyst can go away on its own with hot baths or warm packs. Large cysts or cysts that are infected may be treated with: Antibiotics. A procedure to drain the fluid. Cysts that keep coming back will need to be drained many times. Your doctor may talk with you about surgery to take out the cyst. Follow these instructions at home: Medicines Take your medicines only as told. If you were given antibiotics, take them as told. Do not stop taking them even if you start to feel better. Managing pain and swelling Use heat as told. Use the heat source that your doctor recommends, such as a moist heat pack or a heating pad. Do this as  often as told. Place a towel between your skin and the heat source. Leave the heat on for 20-30 minutes. If your skin turns red, take off the heat right away to prevent burns. The risk of burns is higher if you can't feel pain, heat, or cold. General instructions Try sitz baths to help with pain and swelling. A sitz bath is a warm water bath. The water should only come up to your hips and should cover your butt. You may take sitz baths several times a day. Do not push on or squeeze your cyst. Do not have sex until the cyst has gone away or your wound from cyst drainage has healed. Take these steps to help prevent a cyst from coming back and to prevent other cysts from forming: Take a bath or shower once a day. Clean your vaginal area with mild soap and water. Practice safe sex to prevent STIs. Talk to your doctor about how to protect yourself from STIs. Ask your doctor which forms of birth control are best for you. Contact a doctor if: You have a fever. You have a cyst that gets larger or a cyst that comes back. You have new symptoms. Your symptoms get worse. This information is not intended to replace advice given to you by your health care provider. Make sure you discuss any questions you have with your health care provider.  Document Revised: 02/28/2023 Document Reviewed: 02/28/2023 Elsevier Patient Education  2025 [employer].

## 2024-03-19 ENCOUNTER — Ambulatory Visit: Admitting: Nurse Practitioner

## 2024-03-22 ENCOUNTER — Encounter: Payer: Self-pay | Admitting: Urology

## 2024-04-16 ENCOUNTER — Ambulatory Visit: Admitting: Dermatology

## 2024-04-16 ENCOUNTER — Encounter: Payer: Self-pay | Admitting: Dermatology

## 2024-04-16 DIAGNOSIS — D1801 Hemangioma of skin and subcutaneous tissue: Secondary | ICD-10-CM

## 2024-04-16 DIAGNOSIS — D229 Melanocytic nevi, unspecified: Secondary | ICD-10-CM

## 2024-04-16 DIAGNOSIS — D489 Neoplasm of uncertain behavior, unspecified: Secondary | ICD-10-CM | POA: Diagnosis not present

## 2024-04-16 DIAGNOSIS — L578 Other skin changes due to chronic exposure to nonionizing radiation: Secondary | ICD-10-CM | POA: Diagnosis not present

## 2024-04-16 DIAGNOSIS — L821 Other seborrheic keratosis: Secondary | ICD-10-CM

## 2024-04-16 DIAGNOSIS — I890 Lymphedema, not elsewhere classified: Secondary | ICD-10-CM

## 2024-04-16 DIAGNOSIS — W908XXA Exposure to other nonionizing radiation, initial encounter: Secondary | ICD-10-CM | POA: Diagnosis not present

## 2024-04-16 DIAGNOSIS — L570 Actinic keratosis: Secondary | ICD-10-CM

## 2024-04-16 DIAGNOSIS — D171 Benign lipomatous neoplasm of skin and subcutaneous tissue of trunk: Secondary | ICD-10-CM

## 2024-04-16 DIAGNOSIS — L820 Inflamed seborrheic keratosis: Secondary | ICD-10-CM

## 2024-04-16 DIAGNOSIS — Z85828 Personal history of other malignant neoplasm of skin: Secondary | ICD-10-CM

## 2024-04-16 DIAGNOSIS — Z1283 Encounter for screening for malignant neoplasm of skin: Secondary | ICD-10-CM

## 2024-04-16 DIAGNOSIS — I781 Nevus, non-neoplastic: Secondary | ICD-10-CM

## 2024-04-16 DIAGNOSIS — L814 Other melanin hyperpigmentation: Secondary | ICD-10-CM

## 2024-04-16 NOTE — Patient Instructions (Addendum)

## 2024-04-16 NOTE — Progress Notes (Signed)
 Follow-Up Visit   Subjective  Mary [name] is a 83 y.o. female who presents for the following: Skin Cancer Screening and Full Body Skin Exam Hx of bcc, hx of isk,   Scabby spot at left cheek area   The patient presents for Total-Body Skin Exam (TBSE) for skin cancer screening and mole check. The patient has spots, moles and lesions to be evaluated, some may be new or changing and the patient may have concern these could be cancer.  The following portions of the chart were reviewed this encounter and updated as appropriate: medications, allergies, medical history  Review of Systems:  No other skin or systemic complaints except as noted in HPI or Assessment and Plan.  Objective  Well appearing patient in no apparent distress; mood and affect are within normal limits.  A full examination was performed including scalp, head, eyes, ears, nose, lips, neck, chest, axillae, abdomen, back, buttocks, bilateral upper extremities, bilateral lower extremities, hands, feet, fingers, toes, fingernails, and toenails. All findings within normal limits unless otherwise noted below.   Relevant physical exam findings are noted in the Assessment and Plan.  Left Preauricular Area 1.1 cm pink crusted patch   left lateral deltoid 1.7 cm pink crusted patch   Left eyebrow , underwear area,  left chest Erythematous stuck-on, waxy papule or plaque face Erythematous thin papules/macules with gritty scale.   Assessment & Plan   HISTORY OF BASAL CELL CARCINOMA OF THE SKIN 2016 left lower leg  trx in [place]   - No evidence of recurrence today - Recommend regular full body skin exams - Recommend daily broad spectrum sunscreen SPF 30+ to sun-exposed areas, reapply every 2 hours as needed.  - Call if any new or changing lesions are noted between    SKIN CANCER SCREENING PERFORMED TODAY.  ACTINIC DAMAGE - Chronic condition, secondary to cumulative UV/sun exposure - diffuse scaly erythematous macules  with underlying dyspigmentation - Recommend daily broad spectrum sunscreen SPF 30+ to sun-exposed areas, reapply every 2 hours as needed.  - Staying in the shade or wearing long sleeves, sun glasses (UVA+UVB protection) and wide brim hats (4-inch brim around the entire circumference of the hat) are also recommended for sun protection.  - Call for new or changing lesions.  LENTIGINES, SEBORRHEIC KERATOSES, HEMANGIOMAS - Benign normal skin lesions - Benign-appearing - Call for any changes  MELANOCYTIC NEVI - Tan-brown and/or pink-flesh-colored symmetric macules and papules - Benign appearing on exam today - Observation - Call clinic for new or changing moles - Recommend daily use of broad spectrum spf 30+ sunscreen to sun-exposed areas.   [place] Exam: red or purple papule at right ear helix  Treatment Plan: Benign-appearing.  Observation.  Call clinic for new or changing moles.  Recommend daily use of broad   Lipoma of torso R scapula Benign-appearing. Exam most consistent with an Lipoma. Discussed that a Lipoma is a benign fatty growth that can grow over time and sometimes become painful or otherwise symptomatic. Some patients may have one or several lipomas.. Benign Hereditary Lipomatosis is a hereditary familial condition where family members tend to grow multiple lipomas.  Recommend observation if it is not changing, growing or symptomatic. Recommend surgical excision to remove it if it is painful, growing, symptomatic, or other changes noted. Please contact our office for new or changing lesions so they can be evaluated.   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    INFLAMED SEBORRHEIC KERATOSIS Left eyebrow ,  underwear area,  left chest Symptomatic, irritating, patient would like treated.  Discussed will treat In January, patient going on trip ACTINIC KERATOSIS face Patient is going on a trip in the next week , discussed will treat at next follow up in January    Actinic keratoses are precancerous spots that appear secondary to cumulative UV radiation exposure/sun exposure over time. They are chronic with expected duration over 1 year. A portion of actinic keratoses will progress to squamous cell carcinoma of the skin. It is not possible to reliably predict which spots will progress to skin cancer and so treatment is recommended to prevent development of skin cancer.  Recommend daily broad spectrum sunscreen SPF 30+ to sun-exposed areas, reapply every 2 hours as needed.  Recommend staying in the shade or wearing long sleeves, sun glasses (UVA+UVB protection) and wide brim hats (4-inch brim around the entire circumference of the hat). Call for new or changing lesions. Return for january 2 shv removal edc and treated aks and isks , 1 year tbse .  IEleanor Blush, CMA, am acting as scribe for Alm Rhyme, MD.   Documentation: I have reviewed the above documentation for accuracy and completeness, and I agree with the above.  Alm Rhyme, MD

## 2024-04-19 ENCOUNTER — Ambulatory Visit: Payer: Medicare Other | Admitting: Dermatology

## 2024-05-07 ENCOUNTER — Other Ambulatory Visit: Admitting: Urology

## 2024-05-10 ENCOUNTER — Other Ambulatory Visit: Payer: Self-pay | Admitting: Family Medicine

## 2024-05-11 NOTE — Telephone Encounter (Signed)
 Requested Prescriptions  Pending Prescriptions Disp Refills   buPROPion  (WELLBUTRIN  XL) 150 MG 24 hr tablet [Pharmacy Med Name: BUPROPION  XL 150MG  TABLETS (24 H)] 90 tablet 0    Sig: TAKE 1 TABLET(150 MG) BY MOUTH DAILY     Psychiatry: Antidepressants - bupropion  Failed - 05/11/2024  3:53 PM      Failed - Last BP in normal range    BP Readings from Last 1 Encounters:  02/06/24 (!) 151/72         Passed - Cr in normal range and within 360 days    Creatinine, Ser  Date Value Ref Range Status  11/14/2023 0.92 0.57 - 1.00 mg/dL Final         Passed - AST in normal range and within 360 days    AST  Date Value Ref Range Status  11/14/2023 22 0 - 40 IU/L Final         Passed - ALT in normal range and within 360 days    ALT  Date Value Ref Range Status  11/14/2023 21 0 - 32 IU/L Final         Passed - Completed PHQ-2 or PHQ-9 in the last 360 days      Passed - Valid encounter within last 6 months    Recent Outpatient Visits           5 months ago Primary hypertension   [facility] [place], [place], DO   10 months ago Labial abscess   [facility] [place], [place], DO   10 months ago Labial abscess   [facility] Vicci Duwaine SQUIBB, DO       Future Appointments             In 1 month Hester Alm BROCKS, MD [facility]   In 11 months Hester Alm BROCKS, MD [facility]

## 2024-05-15 ENCOUNTER — Encounter: Payer: Self-pay | Admitting: Family Medicine

## 2024-05-15 ENCOUNTER — Ambulatory Visit (INDEPENDENT_AMBULATORY_CARE_PROVIDER_SITE_OTHER): Admitting: Family Medicine

## 2024-05-15 VITALS — BP 132/76 | Ht 63.5 in | Wt 170.2 lb

## 2024-05-15 DIAGNOSIS — Z1231 Encounter for screening mammogram for malignant neoplasm of breast: Secondary | ICD-10-CM

## 2024-05-15 DIAGNOSIS — Z780 Asymptomatic menopausal state: Secondary | ICD-10-CM

## 2024-05-15 DIAGNOSIS — F339 Major depressive disorder, recurrent, unspecified: Secondary | ICD-10-CM

## 2024-05-15 DIAGNOSIS — F5102 Adjustment insomnia: Secondary | ICD-10-CM

## 2024-05-15 DIAGNOSIS — I10 Essential (primary) hypertension: Secondary | ICD-10-CM

## 2024-05-15 DIAGNOSIS — E785 Hyperlipidemia, unspecified: Secondary | ICD-10-CM

## 2024-05-15 DIAGNOSIS — D171 Benign lipomatous neoplasm of skin and subcutaneous tissue of trunk: Secondary | ICD-10-CM

## 2024-05-15 LAB — MICROALBUMIN, URINE WAIVED
Creatinine, Urine Waived: 100 mg/dL (ref 10–300)
Microalb, Ur Waived: 30 mg/L — ABNORMAL HIGH (ref 0–19)
Microalb/Creat Ratio: <30 mg/g (ref <30)

## 2024-05-15 MED ORDER — PRAVASTATIN SODIUM 40 MG PO TABS: 40.0000 mg | ORAL_TABLET | Freq: Every day | ORAL | 1 refills | Status: AC

## 2024-05-15 MED ORDER — METOPROLOL SUCCINATE ER 25 MG PO TB24: 12.5000 mg | ORAL_TABLET | Freq: Every day | ORAL | 1 refills | Status: AC

## 2024-05-15 MED ORDER — BUPROPION HCL ER (XL) 150 MG PO TB24: 150.0000 mg | ORAL_TABLET | Freq: Every day | ORAL | 1 refills | Status: AC

## 2024-05-15 MED ORDER — FUROSEMIDE 20 MG PO TABS: 20.0000 mg | ORAL_TABLET | ORAL | 1 refills | Status: AC

## 2024-05-15 MED ORDER — SERTRALINE HCL 50 MG PO TABS: ORAL_TABLET | ORAL | 1 refills | Status: AC

## 2024-05-15 MED ORDER — TRAZODONE HCL 50 MG PO TABS: ORAL_TABLET | ORAL | 1 refills | Status: AC

## 2024-05-15 NOTE — Assessment & Plan Note (Signed)
 Under good control on current regimen. Continue current regimen. Continue to monitor. Call with any concerns. Refills given. Labs drawn today.

## 2024-05-15 NOTE — Progress Notes (Signed)
 BP 132/76   Ht 5' 3.5 (1.613 m)   Wt 170 lb 3.2 oz (77.2 kg)   SpO2 99%   BMI 29.68 kg/m    Subjective:    Patient ID: Mary [name], female    DOB: 09/27/1940, 83 y.o.   MRN: [id number]  HPI: Mary [name] is a 83 y.o. female  Chief Complaint  Patient presents with   Hypertension   Hyperlipidemia   Depression   Insomnia   HYPERTENSION / HYPERLIPIDEMIA Satisfied with current treatment? yes Duration of hypertension: chronic BP monitoring frequency: not checking BP medication side effects: no Past BP meds: metoprolol , lasix  Duration of hyperlipidemia: chronic Cholesterol medication side effects: no Cholesterol supplements: none Past cholesterol medications: pravastatin  Medication compliance: excellent compliance Aspirin: no Recent stressors: no Recurrent headaches: no Visual changes: no Palpitations: no Dyspnea: no Chest pain: no Lower extremity edema: no Dizzy/lightheaded: no  DEPRESSION Mood status: controlled Satisfied with current treatment?: yes Symptom severity: mild  Duration of current treatment : chronic Side effects: no Medication compliance: excellent compliance Psychotherapy/counseling: no  Previous psychiatric medications: welbutrin Depressed mood: no Anxious mood: no Anhedonia: no Significant weight loss or gain: no Insomnia: no  Fatigue: no Feelings of worthlessness or guilt: no Impaired concentration/indecisiveness: no Suicidal ideations: no Hopelessness: no Crying spells: no    05/15/2024   10:53 AM 05/15/2024   10:52 AM 01/26/2024    9:33 AM 11/14/2023   10:41 AM 07/12/2023    2:28 PM  Depression screen PHQ 2/9  Decreased Interest 0 0 0 0 0  Down, Depressed, Hopeless 0 0 0 0 0  PHQ - 2 Score 0 0 0 0 0  Altered sleeping 1 1 0 0 0  Tired, decreased energy 0 0 0 0 0  Change in appetite 1 0 0 0 0  Feeling bad or failure about yourself  0 0 0 0 0  Trouble concentrating 0 0 1 1 0  Moving slowly or fidgety/restless 0 0 0 0 0  Suicidal  thoughts 0 0 0 0 0  PHQ-9 Score 2 1 1  1   0   Difficult doing work/chores Not difficult at all Not difficult at all Not difficult at all Not difficult at all Not difficult at all     Data saved with a previous flowsheet row definition    INSOMNIA Duration: chronic Satisfied with sleep quality: yes Difficulty falling asleep: no Difficulty staying asleep: yes Waking a few hours after sleep onset: no Early morning awakenings: no Daytime hypersomnolence: no Wakes feeling refreshed: no Good sleep hygiene: yes Apnea: no Snoring: no Depressed/anxious mood: no Recent stress: no Restless legs/nocturnal leg cramps: no Chronic pain/arthritis: no History of sleep study: no Treatments attempted: trazodone     Relevant past medical, surgical, family and social history reviewed and updated as indicated. Interim medical history since our last visit reviewed. Allergies and medications reviewed and updated.  Review of Systems  Constitutional: Negative.   HENT: Negative.    Eyes: Negative.   Respiratory: Negative.    Cardiovascular:  Positive for palpitations and leg swelling. Negative for chest pain.  Gastrointestinal: Negative.   Endocrine: Negative.   Genitourinary: Negative.   Musculoskeletal: Negative.   Skin: Negative.   Allergic/Immunologic: Positive for environmental allergies. Negative for food allergies and immunocompromised state.  Neurological: Negative.   Hematological: Negative.   Psychiatric/Behavioral: Negative.      Per HPI unless specifically indicated above     Objective:    BP 132/76   Ht 5' 3.5 (  1.613 m)   Wt 170 lb 3.2 oz (77.2 kg)   SpO2 99%   BMI 29.68 kg/m   Wt Readings from Last 3 Encounters:  05/15/24 170 lb 3.2 oz (77.2 kg)  01/26/24 165 lb (74.8 kg)  12/08/23 167 lb 9.6 oz (76 kg)    Physical Exam Vitals and nursing note reviewed.  Constitutional:      General: She is not in acute distress.    Appearance: Normal appearance. She is not  ill-appearing, toxic-appearing or diaphoretic.  HENT:     Head: Normocephalic and atraumatic.     Right Ear: Tympanic membrane, ear canal and external ear normal. There is no impacted cerumen.     Left Ear: Tympanic membrane, ear canal and external ear normal. There is no impacted cerumen.     Nose: Nose normal. No congestion or rhinorrhea.     Mouth/Throat:     Mouth: Mucous membranes are moist.     Pharynx: Oropharynx is clear. No oropharyngeal exudate or posterior oropharyngeal erythema.  Eyes:     General: No scleral icterus.       Right eye: No discharge.        Left eye: No discharge.     Extraocular Movements: Extraocular movements intact.     Conjunctiva/sclera: Conjunctivae normal.     Pupils: Pupils are equal, round, and reactive to light.  Neck:     Vascular: No carotid bruit.  Cardiovascular:     Rate and Rhythm: Normal rate and regular rhythm.     Pulses: Normal pulses.     Heart sounds: No murmur heard.    No friction rub. No gallop.  Pulmonary:     Effort: Pulmonary effort is normal. No respiratory distress.     Breath sounds: Normal breath sounds. No stridor. No wheezing, rhonchi or rales.  Chest:     Chest wall: No tenderness.  Abdominal:     General: Abdomen is flat. Bowel sounds are normal. There is no distension.     Palpations: Abdomen is soft. There is no mass.     Tenderness: There is no abdominal tenderness. There is no right CVA tenderness, left CVA tenderness, guarding or rebound.     Hernia: No hernia is present.  Genitourinary:    Comments: Breast and pelvic exams deferred with shared decision making Musculoskeletal:        General: No swelling, tenderness, deformity or signs of injury.     Cervical back: Normal range of motion and neck supple. No rigidity. No muscular tenderness.     Right lower leg: No edema.     Left lower leg: No edema.  Lymphadenopathy:     Cervical: No cervical adenopathy.  Skin:    General: Skin is warm and dry.      Capillary Refill: Capillary refill takes less than 2 seconds.     Coloration: Skin is not jaundiced or pale.     Findings: No bruising, erythema, lesion or rash.     Comments: 3 inch lipoma on R periscapular region  Neurological:     General: No focal deficit present.     Mental Status: She is alert and oriented to person, place, and time. Mental status is at baseline.     Cranial Nerves: No cranial nerve deficit.     Sensory: No sensory deficit.     Motor: No weakness.     Coordination: Coordination normal.     Gait: Gait normal.     Deep Tendon Reflexes:  Reflexes normal.  Psychiatric:        Mood and Affect: Mood normal.        Behavior: Behavior normal.        Thought Content: Thought content normal.        Judgment: Judgment normal.     Results for orders placed or performed in visit on 05/15/24  Microalbumin, Urine Waived   Collection Time: 05/15/24 11:01 AM  Result Value Ref Range   Microalb, Ur Waived 30 (H) 0 - 19 mg/L   Creatinine, Urine Waived 100 10 - 300 mg/dL   Microalb/Creat Ratio <30 <30 mg/g      Assessment & Plan:   Problem List Items Addressed This Visit       Cardiovascular and Mediastinum   Primary hypertension - Primary   Under good control on current regimen. Continue current regimen. Continue to monitor. Call with any concerns. Refills given. Labs drawn today.        Relevant Medications   furosemide  (LASIX ) 20 MG tablet   metoprolol  succinate (TOPROL -XL) 25 MG 24 hr tablet   pravastatin  (PRAVACHOL ) 40 MG tablet   Other Relevant Orders   CBC with Differential/Platelet   Comprehensive metabolic panel with GFR   TSH   Microalbumin, Urine Waived (Completed)     Other   Hyperlipidemia   Under good control on current regimen. Continue current regimen. Continue to monitor. Call with any concerns. Refills given. Labs drawn today.       Relevant Medications   furosemide  (LASIX ) 20 MG tablet   metoprolol  succinate (TOPROL -XL) 25 MG 24 hr tablet    pravastatin  (PRAVACHOL ) 40 MG tablet   Other Relevant Orders   CBC with Differential/Platelet   Comprehensive metabolic panel with GFR   Lipid Panel w/o Chol/HDL Ratio   Insomnia   Under good control on current regimen. Continue current regimen. Continue to monitor. Call with any concerns. Refills given. Labs drawn today.       Relevant Orders   CBC with Differential/Platelet   Comprehensive metabolic panel with GFR   Depression, recurrent   Under good control on current regimen. Continue current regimen. Continue to monitor. Call with any concerns. Refills given. Labs drawn today.       Relevant Medications   sertraline  (ZOLOFT ) 50 MG tablet   traZODone  (DESYREL ) 50 MG tablet   buPROPion  (WELLBUTRIN  XL) 150 MG 24 hr tablet   Other Relevant Orders   CBC with Differential/Platelet   Comprehensive metabolic panel with GFR   Other Visit Diagnoses       Encounter for screening mammogram for malignant neoplasm of breast       Mammo ordered today.   Relevant Orders   MM 3D SCREENING MAMMOGRAM BILATERAL BREAST     Postmenopausal estrogen deficiency       DEXA ordered today.   Relevant Orders   DG Bone Density     Lipoma of torso       Referral to general surgery- causing her pain   Relevant Orders   Ambulatory referral to General Surgery        Follow up plan: Return in about 6 months (around 11/13/2024).

## 2024-05-15 NOTE — Patient Instructions (Signed)
 Please call to schedule your mammogram and/or bone density: [facility] at [facility]  Address: [address], [place], [place] [address] Phone: [phone]  [facility] at [facility] [address] [place],  [place]  [address] Phone: [phone]

## 2024-05-16 LAB — COMPREHENSIVE METABOLIC PANEL WITH GFR
ALT: 14 IU/L (ref 0–32)
AST: 25 IU/L (ref 0–40)
Albumin: 4.5 g/dL (ref 3.7–4.7)
Alkaline Phosphatase: 88 IU/L (ref 48–129)
BUN/Creatinine Ratio: 17 (ref 12–28)
BUN: 15 mg/dL (ref 8–27)
Bilirubin Total: 0.5 mg/dL (ref 0.0–1.2)
CO2: 23 mmol/L (ref 20–29)
Calcium: 9.3 mg/dL (ref 8.7–10.3)
Chloride: 102 mmol/L (ref 96–106)
Creatinine, Ser: 0.9 mg/dL (ref 0.57–1.00)
Globulin, Total: 2.1 g/dL (ref 1.5–4.5)
Glucose: 93 mg/dL (ref 70–99)
Potassium: 4.9 mmol/L (ref 3.5–5.2)
Sodium: 141 mmol/L (ref 134–144)
Total Protein: 6.6 g/dL (ref 6.0–8.5)
eGFR: 64 mL/min/1.73 (ref >59)

## 2024-05-16 LAB — CBC WITH DIFFERENTIAL/PLATELET
Basophils Absolute: 0 x10E3/uL (ref 0.0–0.2)
Basos: 1 %
EOS (ABSOLUTE): 0.1 x10E3/uL (ref 0.0–0.4)
Eos: 2 %
Hematocrit: 40.7 % (ref 34.0–46.6)
Hemoglobin: 13.2 g/dL (ref 11.1–15.9)
Immature Grans (Abs): 0 x10E3/uL (ref 0.0–0.1)
Immature Granulocytes: 0 %
Lymphocytes Absolute: 1.1 x10E3/uL (ref 0.7–3.1)
Lymphs: 26 %
MCH: 31.9 pg (ref 26.6–33.0)
MCHC: 32.4 g/dL (ref 31.5–35.7)
MCV: 98 fL — ABNORMAL HIGH (ref 79–97)
Monocytes Absolute: 0.3 x10E3/uL (ref 0.1–0.9)
Monocytes: 7 %
Neutrophils Absolute: 2.9 x10E3/uL (ref 1.4–7.0)
Neutrophils: 64 %
Platelets: 187 x10E3/uL (ref 150–450)
RBC: 4.14 x10E6/uL (ref 3.77–5.28)
RDW: 13 % (ref 11.7–15.4)
WBC: 4.4 x10E3/uL (ref 3.4–10.8)

## 2024-05-16 LAB — TSH: TSH: 1.22 u[IU]/mL (ref 0.450–4.500)

## 2024-05-16 LAB — LIPID PANEL W/O CHOL/HDL RATIO
Cholesterol, Total: 168 mg/dL (ref 100–199)
HDL: 79 mg/dL (ref >39)
LDL Chol Calc (NIH): 78 mg/dL (ref 0–99)
Triglycerides: 54 mg/dL (ref 0–149)
VLDL Cholesterol Cal: 11 mg/dL (ref 5–40)

## 2024-05-17 ENCOUNTER — Ambulatory Visit: Payer: Self-pay | Admitting: Family Medicine

## 2024-05-31 HISTORY — PX: SQUAMOUS CELL CARCINOMA EXCISION: SHX2433

## 2024-06-08 ENCOUNTER — Ambulatory Visit: Payer: Self-pay | Admitting: Surgery

## 2024-06-13 ENCOUNTER — Encounter: Payer: Self-pay | Admitting: Dermatology

## 2024-06-13 ENCOUNTER — Ambulatory Visit: Admitting: Dermatology

## 2024-06-13 DIAGNOSIS — D0462 Carcinoma in situ of skin of left upper limb, including shoulder: Secondary | ICD-10-CM

## 2024-06-13 DIAGNOSIS — C44619 Basal cell carcinoma of skin of left upper limb, including shoulder: Secondary | ICD-10-CM

## 2024-06-13 DIAGNOSIS — D0439 Carcinoma in situ of skin of other parts of face: Secondary | ICD-10-CM | POA: Diagnosis not present

## 2024-06-13 DIAGNOSIS — D099 Carcinoma in situ, unspecified: Secondary | ICD-10-CM

## 2024-06-13 DIAGNOSIS — D485 Neoplasm of uncertain behavior of skin: Secondary | ICD-10-CM

## 2024-06-13 DIAGNOSIS — W908XXA Exposure to other nonionizing radiation, initial encounter: Secondary | ICD-10-CM | POA: Diagnosis not present

## 2024-06-13 DIAGNOSIS — L820 Inflamed seborrheic keratosis: Secondary | ICD-10-CM | POA: Diagnosis not present

## 2024-06-13 DIAGNOSIS — L570 Actinic keratosis: Secondary | ICD-10-CM | POA: Diagnosis not present

## 2024-06-13 DIAGNOSIS — C44310 Basal cell carcinoma of skin of unspecified parts of face: Secondary | ICD-10-CM

## 2024-06-13 DIAGNOSIS — C4491 Basal cell carcinoma of skin, unspecified: Secondary | ICD-10-CM

## 2024-06-13 HISTORY — DX: Basal cell carcinoma of skin, unspecified: C44.91

## 2024-06-13 HISTORY — DX: Carcinoma in situ, unspecified: D09.9

## 2024-06-13 NOTE — Patient Instructions (Signed)
 Electrodesiccation and Curettage ("Scrape and Burn") Wound Care Instructions  Leave the original bandage on for 24 hours if possible.  If the bandage becomes soaked or soiled before that time, it is OK to remove it and examine the wound.  A small amount of post-operative bleeding is normal.  If excessive bleeding occurs, remove the bandage, place gauze over the site and apply continuous pressure (no peeking) over the area for 30 minutes. If this does not work, please call our clinic as soon as possible or page your doctor if it is after hours.   Once a day, cleanse the wound with soap and water. It is fine to shower. If a thick crust develops you may use a Q-tip dipped into dilute hydrogen peroxide (mix 1:1 with water) to dissolve it.  Hydrogen peroxide can slow the healing process, so use it only as needed.    After washing, apply petroleum jelly (Vaseline) or an antibiotic ointment if your doctor prescribed one for you, followed by a bandage.    For best healing, the wound should be covered with a layer of ointment at all times. If you are not able to keep the area covered with a bandage to hold the ointment in place, this may mean re-applying the ointment several times a day.  Continue this wound care until the wound has healed and is no longer open. It may take several weeks for the wound to heal and close.  Itching and mild discomfort is normal during the healing process.  If you have any discomfort, you can take Tylenol  (acetaminophen ) or ibuprofen as directed on the bottle. (Please do not take these if you have an allergy to them or cannot take them for another reason).  Some redness, tenderness and white or yellow material in the wound is normal healing.  If the area becomes very sore and red, or develops a thick yellow-green material (pus), it may be infected; please notify us .    Wound healing continues for up to one year following surgery. It is not unusual to experience pain in the scar  from time to time during the interval.  If the pain becomes severe or the scar thickens, you should notify the office.    A slight amount of redness in a scar is expected for the first six months.  After six months, the redness will fade and the scar will soften and fade.  The color difference becomes less noticeable with time.  If there are any problems, return for a post-op surgery check at your earliest convenience.  To improve the appearance of the scar, you can use silicone scar gel, cream, or sheets (such as Mederma or Serica) every night for up to one year. These are available over the counter (without a prescription).  Please call our office at [phone] for any questions or concerns.  Due to recent changes in healthcare laws, you may see results of your pathology and/or laboratory studies on MyChart before the doctors have had a chance to review them. We understand that in some cases there may be results that are confusing or concerning to you. Please understand that not all results are received at the same time and often the doctors may need to interpret multiple results in order to provide you with the best plan of care or course of treatment. Therefore, we ask that you please give us  2 business days to thoroughly review all your results before contacting the office for clarification. Should we see a  critical lab result, you will be contacted sooner.   If You Need Anything After Your Visit  If you have any questions or concerns for your doctor, please call our main line at [phone] and press option 4 to reach your doctor's medical assistant. If no one answers, please leave a voicemail as directed and we will return your call as soon as possible. Messages left after 4 pm will be answered the following business day.   You may also send us  a message via MyChart. We typically respond to MyChart messages within 1-2 business days.  For prescription refills, please ask your pharmacy to  contact our office. Our fax number is [phone].  If you have an urgent issue when the clinic is closed that cannot wait until the next business day, you can page your doctor at the number below.    Please note that while we do our best to be available for urgent issues outside of office hours, we are not available 24/7.   If you have an urgent issue and are unable to reach us , you may choose to seek medical care at your doctor's office, retail clinic, urgent care center, or emergency room.  If you have a medical emergency, please immediately call 911 or go to the emergency department.  Pager Numbers  - Dr. Hester: [phone]  - Dr. Jackquline: [phone]  - Dr. Claudene: [phone]   - Dr. Raymund: [phone]  In the event of inclement weather, please call our main line at [phone] for an update on the status of any delays or closures.  Dermatology Medication Tips: Please keep the boxes that topical medications come in in order to help keep track of the instructions about where and how to use these. Pharmacies typically print the medication instructions only on the boxes and not directly on the medication tubes.   If your medication is too expensive, please contact our office at [phone] option 4 or send us  a message through MyChart.   We are unable to tell what your co-pay for medications will be in advance as this is different depending on your insurance coverage. However, we may be able to find a substitute medication at lower cost or fill out paperwork to get insurance to cover a needed medication.   If a prior authorization is required to get your medication covered by your insurance company, please allow us  1-2 business days to complete this process.  Drug prices often vary depending on where the prescription is filled and some pharmacies may offer cheaper prices.  The website www.goodrx.com contains coupons for medications through different pharmacies. The prices  here do not account for what the cost may be with help from insurance (it may be cheaper with your insurance), but the website can give you the price if you did not use any insurance.  - You can print the associated coupon and take it with your prescription to the pharmacy.  - You may also stop by our office during regular business hours and pick up a GoodRx coupon card.  - If you need your prescription sent electronically to a different pharmacy, notify our office through [facility] or by phone at [phone] option 4.     Si Usted Necesita Algo Despus de Su Visita  Tambin puede enviarnos un mensaje a travs de Clinical cytogeneticist. Por lo general respondemos a los mensajes de MyChart en el transcurso de 1 a 2 das hbiles.  Para renovar recetas, por favor pida a su farmacia que  se ponga en contacto con nuestra oficina. Randi lakes de fax es [place] [phone].  Si tiene un asunto urgente cuando la clnica est cerrada y que no puede esperar hasta el siguiente da hbil, puede llamar/localizar a su doctor(a) al nmero que aparece a continuacin.   Por favor, tenga en cuenta que aunque hacemos todo lo posible para estar disponibles para asuntos urgentes fuera del horario de [place], no estamos disponibles las 24 horas del da, los 7 [address]  [address] de la [place].   Si tiene un problema urgente y no puede comunicarse con nosotros, puede optar por buscar atencin mdica  en el consultorio de su doctor(a), en una clnica privada, en un centro de atencin urgente o en una sala de emergencias.  Si tiene Engineer, drilling, por favor llame inmediatamente al 911 o vaya a la sala de emergencias.  Nmeros de bper  - Dr. Hester: [phone]  - Dra. Jackquline: [phone]  - Dr. Claudene: [phone]  - Dra. Kitts: [phone]  En caso de inclemencias del [place], por favor llame a nuestra lnea principal al [phone] para una actualizacin sobre el estado de cualquier retraso o cierre.  Consejos  para la medicacin en dermatologa: Por favor, guarde las cajas en las que vienen los medicamentos de uso tpico para ayudarle a seguir las instrucciones sobre dnde y cmo usarlos. Las farmacias generalmente imprimen las instrucciones del medicamento slo en las cajas y no directamente en los tubos del [place].   Si su medicamento es muy caro, por favor, pngase en contacto con landry rieger llamando al [phone] y presione la opcin 4 o envenos un mensaje a travs de Clinical cytogeneticist.   No podemos decirle cul ser su copago por los medicamentos por adelantado ya que esto es diferente dependiendo de la cobertura de su seguro. Sin embargo, es posible que podamos encontrar un medicamento sustituto a Audiological scientist un formulario para que el seguro cubra el medicamento que se considera necesario.   Si se requiere una autorizacin previa para que su compaa de seguros malta su medicamento, por favor permtanos de 1 a 2 das hbiles para completar este proceso.  Los precios de los medicamentos varan con frecuencia dependiendo del Environmental consultant de dnde se surte la receta y alguna farmacias pueden ofrecer precios ms baratos.  El sitio web www.goodrx.com tiene cupones para medicamentos de Health and safety inspector. Los precios aqu no tienen en cuenta lo que podra costar con la ayuda del seguro (puede ser ms barato con su seguro), pero el sitio web puede darle el precio si no utiliz Tourist information centre manager.  - Puede imprimir el cupn correspondiente y llevarlo con su receta a la farmacia.  - Tambin puede pasar por nuestra oficina durante el horario de atencin regular y Education officer, museum una tarjeta de cupones de GoodRx.  - Si necesita que su receta se enve electrnicamente a una farmacia diferente, informe a nuestra oficina a travs de MyChart de [place] o por telfono llamando al [phone] y presione la opcin 4.

## 2024-06-13 NOTE — Progress Notes (Signed)
 "  Follow-Up Visit   Subjective  Mary [name] is a 84 y.o. female who presents for the following: Irregular skin lesions at the L preauricular and L lat deltoid, pt here today for shave removal and ED&C. AK and ISK to be tx w/LN2.   The following portions of the chart were reviewed this encounter and updated as appropriate: medications, allergies, medical history  Review of Systems:  No other skin or systemic complaints except as noted in HPI or Assessment and Plan.  Objective  Well appearing patient in no apparent distress; mood and affect are within normal limits.  A focused examination was performed of the following areas: the face and arms  Relevant exam findings are noted in the Assessment and Plan. L lat deltoid 1.7 cm pink crusted patch.  Left Preauricular Area 1.1 cm pink crusted patch.   Face x 3 (3) Erythematous thin papules/macules with gritty scale.  Face x 6 (6) Erythematous stuck-on, waxy papule or plaque  Assessment & Plan   NEOPLASM OF UNCERTAIN BEHAVIOR OF SKIN (2) L lat deltoid - Epidermal / dermal shaving  Lesion diameter (cm):  1.7 Informed consent: discussed and consent obtained   Timeout: patient name, date of birth, surgical site, and procedure verified   Procedure prep:  Patient was prepped and draped in usual sterile fashion Prep type:  Isopropyl alcohol Anesthesia: the lesion was anesthetized in a standard fashion   Anesthetic:  1% lidocaine w/ epinephrine 1-100,000 buffered w/ 8.4% NaHCO3 Instrument used: flexible razor blade   Hemostasis achieved with: pressure, aluminum chloride and electrodesiccation   Outcome: patient tolerated procedure well   Post-procedure details: sterile dressing applied and wound care instructions given   Dressing type: bandage (Mupirocin  2% ointment)    - Destruction of lesion Complexity: extensive   Destruction method: electrodesiccation and curettage   Informed consent: discussed and consent obtained   Timeout:   patient name, date of birth, surgical site, and procedure verified Procedure prep:  Patient was prepped and draped in usual sterile fashion Prep type:  Isopropyl alcohol Anesthesia: the lesion was anesthetized in a standard fashion   Anesthetic:  1% lidocaine w/ epinephrine 1-100,000 buffered w/ 8.4% NaHCO3 Curettage performed in three different directions: Yes   Electrodesiccation performed over the curetted area: Yes   Lesion length (cm):  1.7 Lesion width (cm):  1.7 Margin per side (cm):  0.2 Final wound size (cm):  2.1 Hemostasis achieved with:  pressure, aluminum chloride and electrodesiccation Outcome: patient tolerated procedure well with no complications   Post-procedure details: sterile dressing applied and wound care instructions given   Dressing type: bandage and petrolatum    Specimen 1 - Surgical pathology Differential Diagnosis: D48.5 r/o BCC vs SCC ED&C today Check Margins: No Left Preauricular Area - Epidermal / dermal shaving  Lesion diameter (cm):  1.1 Informed consent: discussed and consent obtained   Timeout: patient name, date of birth, surgical site, and procedure verified   Procedure prep:  Patient was prepped and draped in usual sterile fashion Prep type:  Isopropyl alcohol Anesthesia: the lesion was anesthetized in a standard fashion   Anesthetic:  1% lidocaine w/ epinephrine 1-100,000 buffered w/ 8.4% NaHCO3 Instrument used: flexible razor blade   Hemostasis achieved with: pressure, aluminum chloride and electrodesiccation   Outcome: patient tolerated procedure well   Post-procedure details: sterile dressing applied and wound care instructions given   Dressing type: bandage (Mupirocin  2% ointment)    - Destruction of lesion Complexity: extensive   Destruction method:  electrodesiccation and curettage   Informed consent: discussed and consent obtained   Timeout:  patient name, date of birth, surgical site, and procedure verified Procedure prep:   Patient was prepped and draped in usual sterile fashion Prep type:  Isopropyl alcohol Anesthesia: the lesion was anesthetized in a standard fashion   Anesthetic:  1% lidocaine w/ epinephrine 1-100,000 buffered w/ 8.4% NaHCO3 Curettage performed in three different directions: Yes   Electrodesiccation performed over the curetted area: Yes   Lesion length (cm):  1.1 Lesion width (cm):  1.1 Margin per side (cm):  0.2 Final wound size (cm):  1.5 Hemostasis achieved with:  pressure, aluminum chloride and electrodesiccation Outcome: patient tolerated procedure well with no complications   Post-procedure details: sterile dressing applied and wound care instructions given   Dressing type: bandage and petrolatum    Specimen 2 - Surgical pathology Differential Diagnosis: D48.5 r/o BCC vs SCC ED&C today Check Margins: No AK (ACTINIC KERATOSIS) (3) Face x 3 (3) Actinic keratoses are precancerous spots that appear secondary to cumulative UV radiation exposure/sun exposure over time. They are chronic with expected duration over 1 year. A portion of actinic keratoses will progress to squamous cell carcinoma of the skin. It is not possible to reliably predict which spots will progress to skin cancer and so treatment is recommended to prevent development of skin cancer.  Recommend daily broad spectrum sunscreen SPF 30+ to sun-exposed areas, reapply every 2 hours as needed.  Recommend staying in the shade or wearing long sleeves, sun glasses (UVA+UVB protection) and wide brim hats (4-inch brim around the entire circumference of the hat). Call for new or changing lesions.  - Destruction of lesion - Face x 3 (3) Complexity: simple   Destruction method: cryotherapy   Informed consent: discussed and consent obtained   Timeout:  patient name, date of birth, surgical site, and procedure verified Lesion destroyed using liquid nitrogen: Yes   Region frozen until ice ball extended beyond lesion: Yes   Outcome:  patient tolerated procedure well with no complications   Post-procedure details: wound care instructions given    INFLAMED SEBORRHEIC KERATOSIS (6) Face x 6 (6) Symptomatic, irritating, patient would like treated.  - Destruction of lesion - Face x 6 (6) Complexity: simple   Destruction method: cryotherapy   Informed consent: discussed and consent obtained   Timeout:  patient name, date of birth, surgical site, and procedure verified Lesion destroyed using liquid nitrogen: Yes   Region frozen until ice ball extended beyond lesion: Yes   Outcome: patient tolerated procedure well with no complications   Post-procedure details: wound care instructions given     Return for appointment as scheduled.  LILLETTE Rosina Mayans, CMA, am acting as scribe for Alm Rhyme, MD .   Documentation: I have reviewed the above documentation for accuracy and completeness, and I agree with the above.  Alm Rhyme, MD    "

## 2024-06-18 ENCOUNTER — Other Ambulatory Visit: Admitting: Urology

## 2024-06-18 LAB — SURGICAL PATHOLOGY

## 2024-06-19 ENCOUNTER — Ambulatory Visit: Payer: Self-pay | Admitting: Dermatology

## 2024-06-19 ENCOUNTER — Encounter: Payer: Self-pay | Admitting: Dermatology

## 2024-06-19 NOTE — Telephone Encounter (Addendum)
 Called and discussed results with patient. She verbalized understanding and denied further questions.  Will recheck areas at next follow up   ----- Message from Alm Rhyme, MD sent at 06/19/2024  9:45 AM EST ----- FINAL DIAGNOSIS        1. 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 Skin, left preauricular area :       SQUAMOUS CELL CARCINOMA IN SITU, HYPERTROPHIC    1- Cancer = BCC With incidental Mild dysplastic nevus Already treated Re-check next visit 2- Cancer = SCCis Already treated Recheck next visit

## 2024-06-20 ENCOUNTER — Ambulatory Visit: Payer: Self-pay | Admitting: Surgery

## 2024-06-20 ENCOUNTER — Encounter: Payer: Self-pay | Admitting: Surgery

## 2024-06-20 VITALS — BP 118/65 | HR 73 | Ht 63.5 in | Wt 172.0 lb

## 2024-06-20 DIAGNOSIS — D171 Benign lipomatous neoplasm of skin and subcutaneous tissue of trunk: Secondary | ICD-10-CM | POA: Diagnosis not present

## 2024-06-20 NOTE — Patient Instructions (Signed)
Follow-up with our office as needed.  Please call and ask to speak with a nurse if you develop questions or concerns.   Lipoma  A lipoma is a noncancerous (benign) tumor that is made up of fat cells. This is a very common type of soft-tissue growth. Lipomas are usually found under the skin (subcutaneous). They may occur in any tissue of the body that contains fat. Common areas for lipomas to appear include the back, arms, shoulders, buttocks, and thighs. Lipomas grow slowly, and they are usually painless. Most lipomas do not cause problems and do not require treatment. What are the causes? The cause of this condition is not known. What increases the risk? You are more likely to develop this condition if: You are 19-24 years old. You have a family history of lipomas. What are the signs or symptoms? A lipoma usually appears as a small, round bump under the skin. In most cases, the lump will: Feel soft or rubbery. Not cause pain or other symptoms. However, if a lipoma is located in an area where it pushes on nerves, it can become painful or cause other symptoms. How is this diagnosed? A lipoma can usually be diagnosed with a physical exam. You may also have tests to confirm the diagnosis and to rule out other conditions. Tests may include: Imaging tests, such as a CT scan or an MRI. Removal of a tissue sample to be looked at under a microscope (biopsy). How is this treated? Treatment for this condition depends on the size of the lipoma and whether it is causing any symptoms. For small lipomas that are not causing problems, no treatment is needed. If a lipoma is bigger or it causes problems, surgery may be done to remove the lipoma. Lipomas can also be removed to improve appearance. Most often, the procedure is done after applying a medicine that numbs the area (local anesthetic). Liposuction may be done to reduce the size of the lipoma before it is removed through surgery, or it may be done  to remove the lipoma. Lipomas are removed with this method to limit incision size and scarring. A liposuction tube is inserted through a small incision into the lipoma, and the contents of the lipoma are removed through the tube with suction. Follow these instructions at home: Watch your lipoma for any changes. Keep all follow-up visits. This is important. Where to find more information OrthoInfo: orthoinfo.aaos.org Contact a health care provider if: Your lipoma becomes larger or hard. Your lipoma becomes painful, red, or increasingly swollen. These could be signs of infection or a more serious condition. Get help right away if: You develop tingling or numbness in an area near the lipoma. This could indicate that the lipoma is causing nerve damage. Summary A lipoma is a noncancerous tumor that is made up of fat cells. Most lipomas do not cause problems and do not require treatment. If a lipoma is bigger or it causes problems, surgery may be done to remove the lipoma. Contact a health care provider if your lipoma becomes larger or hard, or if it becomes painful, red, or increasingly swollen. These could be signs of infection or a more serious condition. This information is not intended to replace advice given to you by your health care provider. Make sure you discuss any questions you have with your health care provider. Document Revised: 06/05/2021 Document Reviewed: 06/05/2021 Elsevier Patient Education  2024 [employer].

## 2024-06-20 NOTE — Progress Notes (Signed)
 " 06/20/2024  Reason for Visit: Right upper back lipoma  Requesting Provider: Duwaine Louder, DO  History of Present Illness: Mary [name] is a 84 y.o. female presenting for evaluation of a right upper back lipoma.  The patient reports that she has had this lipoma for many years likely at least 5 years.  She has noticed over time that it has grown slowly in size.  However she denies any symptoms from it.  Denies any pain, pressure, burning sensation, discomfort.  Reports the main issue for her is that is present and grown in size.  She reports that her mother and her cousin both had lipomas and she wants to get evaluated in case any procedures are needed.  She was recently seen by Dr. Hester with dermatology on 06/13/2024 for different skin lesions but the lipoma was not addressed.  Past Medical History: Past Medical History:  Diagnosis Date   Arrhythmia    Back pain    Basal cell carcinoma    L lower leg txted in [place]  ~ 2016   BCC (basal cell carcinoma of skin) 06/13/2024   left lateral deltoid - with indidental mild dysplastic nevus - trx ED&C recheck at follow up   Depression with anxiety    Hip pain    Hyperlipidemia    Hypertension    Neuromuscular disorder (HCC)    Paroxysmal tachycardia (HCC)    Squamous cell carcinoma in situ (SCCIS) 06/13/2024   left preauricular area - trx ED&C - recheck at follow up     Past Surgical History: Past Surgical History:  Procedure Laterality Date   ABDOMINAL HYSTERECTOMY     BREAST BIOPSY     BREAST EXCISIONAL BIOPSY Left 2002   benign   CARDIAC CATHETERIZATION     knee replacement Bilateral 03/2013   done twice   SQUAMOUS CELL CARCINOMA EXCISION  2026   face   TOTAL HIP ARTHROPLASTY Bilateral    done twice    Home Medications: Prior to Admission medications  Medication Sig Start Date End Date Taking? Authorizing Provider  acetaminophen (TYLENOL) 500 MG tablet Take 500 mg by mouth every 6 (six) hours as needed.   Yes [provider]  buPROPion  (WELLBUTRIN  XL) 150 MG 24 hr tablet Take 1 tablet (150 mg total) by mouth daily. 05/15/24  Yes Johnson, Megan P, DO  Cholecalciferol (VITAMIN D3) 25 MCG (1000 UT) CAPS Take 1,000 Units by mouth daily.   Yes [provider]  furosemide  (LASIX ) 20 MG tablet Take 1 tablet (20 mg total) by mouth every other day. 05/15/24  Yes Johnson, Megan P, DO  metoprolol  succinate (TOPROL -XL) 25 MG 24 hr tablet Take 0.5 tablets (12.5 mg total) by mouth daily. Taking 1/2 tablet daily 05/15/24  Yes Johnson, Megan P, DO  oxybutynin  (DITROPAN -XL) 10 MG 24 hr tablet Take 1 tablet (10 mg total) by mouth daily. 03/16/24  Yes MacDiarmid, Glendia, MD  pravastatin  (PRAVACHOL ) 40 MG tablet Take 1 tablet (40 mg total) by mouth daily. 05/15/24  Yes Johnson, Megan P, DO  sertraline  (ZOLOFT ) 50 MG tablet TAKE 1 TABLET(50 MG) BY MOUTH DAILY 05/15/24  Yes Johnson, Megan P, DO  traZODone  (DESYREL ) 50 MG tablet TAKE 1/2 TO 1 TABLET(25 TO 50 MG) BY MOUTH AT BEDTIME AS NEEDED FOR SLEEP 05/15/24  Yes Johnson, Megan P, DO    Allergies: Allergies[1]  Social History:  reports that she quit smoking about 60 years ago. Her smoking use included cigarettes. She started smoking about 63 years ago.  She has a 1.5 pack-year smoking history. She has been exposed to tobacco smoke. She has never used smokeless tobacco. She reports current alcohol use of about 7.0 standard drinks of alcohol per week. She reports that she does not use drugs.   Family History: Family History  Problem Relation Age of Onset   Hypertension Mother    CAD Mother    Pancreatic cancer Mother    Lung cancer Father    Hypertension Sister    Multiple sclerosis Sister    Breast cancer Neg Hx     Review of Systems: Review of Systems  Constitutional:  Negative for chills and fever.  Respiratory:  Negative for shortness of breath.   Cardiovascular:  Negative for chest pain.  Gastrointestinal:  Negative for nausea and vomiting.  Skin:         Right upper back mass    Physical Exam BP 118/65   Pulse 73   Ht 5' 3.5 (1.613 m)   Wt 172 lb (78 kg)   SpO2 98%   BMI 29.99 kg/m  CONSTITUTIONAL: No acute distress HEENT:  Normocephalic, atraumatic, extraocular motion intact. RESPIRATORY:  Normal respiratory effort without pathologic use of accessory muscles. CARDIOVASCULAR: Regular rhythm and rate  MUSCULOSKELETAL:  Normal muscle strength and tone in all four extremities.  No peripheral edema or cyanosis. SKIN: The patient has a mass in the right upper back at the level of the bra strap which is about 4.5 cm in size.  It is mobile, nontender, without any overlying erythema or induration. NEUROLOGIC:  Motor and sensation is grossly normal.  Cranial nerves are grossly intact. PSYCH:  Alert and oriented to person, place and time. Affect is normal.  Laboratory Analysis: No results found for this or any previous visit (from the past 24 hours).  Imaging: No results found.  Assessment and Plan: This is a 84 y.o. female with right upper back lipoma.  - Discussed with the patient the findings on exam.  His mass is mostly consistent with a lipoma.  Discussed with patient what a lipoma is and that this is a benign slow-growing mass.  Discussed with patient that there is no risk of cancer from this mass.  Discussed with the patient that sometimes the mass can be asymptomatic or as it grows she could potentially push or cause any discomfort and does the majority of times when we remove this.  The patient has been asymptomatic from this mass and only notices that is there and has slowly grown in size.  Given that this is asymptomatic, discussed with patient that we can continue to watch the mass versus excise it.  However unfortunately is without any symptoms, insurance may not cover this I think could be deemed a cosmetic procedure. - After further discussion, the patient has opted for watchful waiting now knowing that this is a benign  mass. - Patient will reach out to us  if anything changes or if there is any concerns from it. - Follow-up as needed.  I spent 30 minutes dedicated to the care of this patient on the date of this encounter to include pre-visit review of records, face-to-face time with the patient discussing diagnosis and management, and any post-visit coordination of care.   Aloysius Sheree Plant, MD [facility]       [1]  Allergies Allergen Reactions   Codeine Nausea Only and Nausea And Vomiting   Simvastatin     Back pain Other reaction(s): back pain   "

## 2024-07-30 ENCOUNTER — Other Ambulatory Visit: Admitting: Urology

## 2024-11-13 ENCOUNTER — Ambulatory Visit: Admitting: Family Medicine

## 2025-02-07 ENCOUNTER — Ambulatory Visit

## 2025-04-16 ENCOUNTER — Ambulatory Visit: Admitting: Dermatology
